# Patient Record
Sex: Female | Born: 1945 | Race: White | Hispanic: No | Smoking: Never smoker
Health system: Southern US, Community
[De-identification: ages and names within clinical notes are randomized; demographics above are authoritative.]

## PROBLEM LIST (undated history)

## (undated) DIAGNOSIS — E669 Obesity, unspecified: Secondary | ICD-10-CM

## (undated) DIAGNOSIS — F32A Depression, unspecified: Secondary | ICD-10-CM

## (undated) DIAGNOSIS — R5383 Other fatigue: Secondary | ICD-10-CM

## (undated) DIAGNOSIS — M1990 Unspecified osteoarthritis, unspecified site: Secondary | ICD-10-CM

## (undated) DIAGNOSIS — Z8719 Personal history of other diseases of the digestive system: Secondary | ICD-10-CM

## (undated) DIAGNOSIS — M2550 Pain in unspecified joint: Secondary | ICD-10-CM

## (undated) DIAGNOSIS — G2581 Restless legs syndrome: Secondary | ICD-10-CM

## (undated) DIAGNOSIS — F419 Anxiety disorder, unspecified: Secondary | ICD-10-CM

## (undated) DIAGNOSIS — Z9889 Other specified postprocedural states: Secondary | ICD-10-CM

## (undated) DIAGNOSIS — I10 Essential (primary) hypertension: Secondary | ICD-10-CM

## (undated) DIAGNOSIS — D649 Anemia, unspecified: Secondary | ICD-10-CM

## (undated) DIAGNOSIS — E538 Deficiency of other specified B group vitamins: Secondary | ICD-10-CM

## (undated) DIAGNOSIS — M549 Dorsalgia, unspecified: Secondary | ICD-10-CM

## (undated) DIAGNOSIS — K219 Gastro-esophageal reflux disease without esophagitis: Secondary | ICD-10-CM

## (undated) DIAGNOSIS — R112 Nausea with vomiting, unspecified: Secondary | ICD-10-CM

## (undated) DIAGNOSIS — I890 Lymphedema, not elsewhere classified: Secondary | ICD-10-CM

## (undated) DIAGNOSIS — K829 Disease of gallbladder, unspecified: Secondary | ICD-10-CM

## (undated) DIAGNOSIS — F329 Major depressive disorder, single episode, unspecified: Secondary | ICD-10-CM

## (undated) DIAGNOSIS — R42 Dizziness and giddiness: Secondary | ICD-10-CM

## (undated) HISTORY — PX: EYE SURGERY: SHX253

## (undated) HISTORY — DX: Depression, unspecified: F32.A

## (undated) HISTORY — DX: Deficiency of other specified B group vitamins: E53.8

## (undated) HISTORY — PX: OVARIAN CYST REMOVAL: SHX89

## (undated) HISTORY — DX: Disease of gallbladder, unspecified: K82.9

## (undated) HISTORY — DX: Dorsalgia, unspecified: M54.9

## (undated) HISTORY — PX: ABDOMINAL HYSTERECTOMY: SHX81

## (undated) HISTORY — DX: Unspecified osteoarthritis, unspecified site: M19.90

## (undated) HISTORY — DX: Pain in unspecified joint: M25.50

## (undated) HISTORY — DX: Other fatigue: R53.83

## (undated) HISTORY — PX: CHOLECYSTECTOMY: SHX55

## (undated) HISTORY — PX: LAPAROSCOPIC GASTRIC BANDING: SHX1100

## (undated) HISTORY — PX: COLONOSCOPY: SHX174

## (undated) HISTORY — DX: Essential (primary) hypertension: I10

## (undated) HISTORY — PX: FRACTURE SURGERY: SHX138

## (undated) HISTORY — DX: Major depressive disorder, single episode, unspecified: F32.9

## (undated) HISTORY — DX: Obesity, unspecified: E66.9

## (undated) HISTORY — DX: Anxiety disorder, unspecified: F41.9

---

## 1998-08-04 ENCOUNTER — Encounter: Payer: Self-pay | Admitting: Family Medicine

## 1998-08-04 ENCOUNTER — Ambulatory Visit (HOSPITAL_COMMUNITY): Admission: RE | Admit: 1998-08-04 | Discharge: 1998-08-04 | Payer: Self-pay | Admitting: Family Medicine

## 2000-02-03 ENCOUNTER — Encounter (INDEPENDENT_AMBULATORY_CARE_PROVIDER_SITE_OTHER): Payer: Self-pay | Admitting: Specialist

## 2000-02-03 ENCOUNTER — Other Ambulatory Visit: Admission: RE | Admit: 2000-02-03 | Discharge: 2000-02-03 | Payer: Self-pay | Admitting: Otolaryngology

## 2002-05-06 ENCOUNTER — Ambulatory Visit (HOSPITAL_BASED_OUTPATIENT_CLINIC_OR_DEPARTMENT_OTHER): Admission: RE | Admit: 2002-05-06 | Discharge: 2002-05-06 | Payer: Self-pay | Admitting: Family Medicine

## 2002-12-02 ENCOUNTER — Emergency Department (HOSPITAL_COMMUNITY): Admission: EM | Admit: 2002-12-02 | Discharge: 2002-12-02 | Payer: Self-pay | Admitting: *Deleted

## 2002-12-27 ENCOUNTER — Ambulatory Visit (HOSPITAL_COMMUNITY): Admission: RE | Admit: 2002-12-27 | Discharge: 2002-12-27 | Payer: Self-pay | Admitting: Family Medicine

## 2003-02-21 ENCOUNTER — Encounter (INDEPENDENT_AMBULATORY_CARE_PROVIDER_SITE_OTHER): Payer: Self-pay | Admitting: Specialist

## 2003-02-21 ENCOUNTER — Ambulatory Visit (HOSPITAL_COMMUNITY): Admission: RE | Admit: 2003-02-21 | Discharge: 2003-02-21 | Payer: Self-pay | Admitting: Gastroenterology

## 2003-05-09 ENCOUNTER — Emergency Department (HOSPITAL_COMMUNITY): Admission: EM | Admit: 2003-05-09 | Discharge: 2003-05-09 | Payer: Self-pay

## 2003-05-21 ENCOUNTER — Encounter: Admission: RE | Admit: 2003-05-21 | Discharge: 2003-08-19 | Payer: Self-pay | Admitting: Family Medicine

## 2003-06-19 ENCOUNTER — Emergency Department (HOSPITAL_COMMUNITY): Admission: EM | Admit: 2003-06-19 | Discharge: 2003-06-19 | Payer: Self-pay | Admitting: Emergency Medicine

## 2004-03-19 ENCOUNTER — Encounter: Admission: RE | Admit: 2004-03-19 | Discharge: 2004-03-19 | Payer: Self-pay | Admitting: Occupational Medicine

## 2005-11-01 ENCOUNTER — Ambulatory Visit (HOSPITAL_COMMUNITY): Admission: RE | Admit: 2005-11-01 | Discharge: 2005-11-01 | Payer: Self-pay | Admitting: Family Medicine

## 2007-02-23 ENCOUNTER — Encounter (INDEPENDENT_AMBULATORY_CARE_PROVIDER_SITE_OTHER): Payer: Self-pay | Admitting: Obstetrics & Gynecology

## 2007-02-23 ENCOUNTER — Ambulatory Visit (HOSPITAL_COMMUNITY): Admission: RE | Admit: 2007-02-23 | Discharge: 2007-02-23 | Payer: Self-pay | Admitting: Obstetrics & Gynecology

## 2007-04-24 ENCOUNTER — Ambulatory Visit (HOSPITAL_COMMUNITY): Admission: RE | Admit: 2007-04-24 | Discharge: 2007-04-25 | Payer: Self-pay | Admitting: Obstetrics & Gynecology

## 2007-04-24 ENCOUNTER — Encounter (INDEPENDENT_AMBULATORY_CARE_PROVIDER_SITE_OTHER): Payer: Self-pay | Admitting: Obstetrics & Gynecology

## 2007-07-27 ENCOUNTER — Emergency Department (HOSPITAL_COMMUNITY): Admission: EM | Admit: 2007-07-27 | Discharge: 2007-07-27 | Payer: Self-pay | Admitting: Emergency Medicine

## 2007-08-08 ENCOUNTER — Emergency Department (HOSPITAL_BASED_OUTPATIENT_CLINIC_OR_DEPARTMENT_OTHER): Admission: EM | Admit: 2007-08-08 | Discharge: 2007-08-08 | Payer: Self-pay | Admitting: Emergency Medicine

## 2008-10-31 ENCOUNTER — Ambulatory Visit (HOSPITAL_COMMUNITY): Admission: RE | Admit: 2008-10-31 | Discharge: 2008-10-31 | Payer: Self-pay | Admitting: General Surgery

## 2008-11-11 ENCOUNTER — Encounter: Admission: RE | Admit: 2008-11-11 | Discharge: 2009-01-21 | Payer: Self-pay | Admitting: General Surgery

## 2009-03-09 ENCOUNTER — Encounter: Admission: RE | Admit: 2009-03-09 | Discharge: 2009-04-07 | Payer: Self-pay | Admitting: General Surgery

## 2009-03-23 ENCOUNTER — Ambulatory Visit (HOSPITAL_COMMUNITY): Admission: RE | Admit: 2009-03-23 | Discharge: 2009-03-24 | Payer: Self-pay | Admitting: General Surgery

## 2009-07-14 ENCOUNTER — Encounter: Admission: RE | Admit: 2009-07-14 | Discharge: 2009-07-14 | Payer: Self-pay | Admitting: General Surgery

## 2009-10-15 ENCOUNTER — Encounter: Admission: RE | Admit: 2009-10-15 | Discharge: 2009-10-23 | Payer: Self-pay | Admitting: General Surgery

## 2010-04-14 LAB — DIFFERENTIAL
Eosinophils Absolute: 0.1 10*3/uL (ref 0.0–0.7)
Eosinophils Relative: 2 % (ref 0–5)
Lymphocytes Relative: 17 % (ref 12–46)
Monocytes Absolute: 0.4 10*3/uL (ref 0.1–1.0)
Neutro Abs: 3.9 10*3/uL (ref 1.7–7.7)
Neutrophils Relative %: 73 % (ref 43–77)

## 2010-04-14 LAB — CBC
MCV: 83.7 fL (ref 78.0–100.0)
Platelets: 174 10*3/uL (ref 150–400)
RDW: 15.9 % — ABNORMAL HIGH (ref 11.5–15.5)

## 2010-04-14 LAB — COMPREHENSIVE METABOLIC PANEL
CO2: 30 mEq/L (ref 19–32)
Calcium: 9.1 mg/dL (ref 8.4–10.5)
GFR calc non Af Amer: >60 mL/min (ref >60)
Potassium: 4 mEq/L (ref 3.5–5.1)

## 2010-04-18 LAB — DIFFERENTIAL
Eosinophils Absolute: 0.1 10*3/uL (ref 0.0–0.7)
Eosinophils Relative: 1 % (ref 0–5)
Monocytes Absolute: 0.5 10*3/uL (ref 0.1–1.0)
Monocytes Relative: 7 % (ref 3–12)
Neutrophils Relative %: 75 % (ref 43–77)

## 2010-04-18 LAB — CBC
Hemoglobin: 12.8 g/dL (ref 12.0–15.0)
RDW: 17.2 % — ABNORMAL HIGH (ref 11.5–15.5)

## 2010-06-08 NOTE — Op Note (Signed)
Paige [name], Paige [name]              ACCOUNT NO.:  [id number]   MEDICAL RECORD NO.:  [id number]          PATIENT TYPE:  AMB   LOCATION:  SDC                           FACILITY:  [facility]   PHYSICIAN:  Genia Del, M.D.DATE OF BIRTH:  03/28/1945   DATE OF PROCEDURE:  02/23/2007  DATE OF DISCHARGE:                               OPERATIVE REPORT   PREOPERATIVE DIAGNOSIS:  Postmenopausal bleeding and focal complex  hyperplasia without atypica.   POSTOPERATIVE DIAGNOSIS:  Postmenopausal bleeding and focal complex  hyperplasia without atypica, with endometrial polyp, thick endometrium,  with atypical blood vessels.   PROCEDURE:  Hysteroscopy plus resection of endometrial polyps and  dilatation and curettage.   SURGEON:  Dr. Genia Del.   ANESTHESIOLOGIST:  Dr. Cristela Blue.   PROCEDURE:  Under MAC analgesia, the patient is in lithotomy position.  She is prepped with Betadine on the suprapubic, vulvar and vaginal areas  and draped as usual.  The bladder is catheterized.  The vaginal exam  reveals an anteverted uterus, no adnexal mass.  The cervix is normal to  palpation.  We then insert the speculum in the vagina.  The anterior lip  of the cervix is grasped with a tenaculum.  A paracervical block is done  with lidocaine 1%, 20 mL total, at 4 and 8 o'clock.  We then proceed  with dilatation of the cervix with Hegar dilators up to #33 without  difficulty.  The hysterometry is at 9 cm.  We insert the diagnostic  hysteroscope in the intrauterine cavity.  Multiple polyps are seen.  Some atypical blood vessels are seen on the thick endometrium.  We  removed the diagnostic hysteroscope.  We proceed with a systematic  curettage of the intrauterine cavity with a sharp curette.  Large  amounts of endometrium is sent to pathology.  We then dilate the cervix  further up to Hegar #37 without difficulty.  We introduce the operative  hysteroscope with a loop inside the intrauterine cavity.  We  resect  multiple polyps, some of them with an atypical look with increased  vascularity and possible complications.  All the resected material is  sent separately to pathology.  We then verify hemostasis.  It is  adequate at all levels.  We complete with curettage of the intrauterine  cavity to remove the specimen as much as possible.  We then removed all  instruments.  Hemostasis is adequate.  The fluid deficit is 300 mL.  Estimated blood loss is minimal.  No complications occurred, and the  patient is brought to recovery room in good stable status.      Genia Del, M.D.  Electronically Signed     ML/MEDQ  D:  02/23/2007  T:  02/24/2007  Job:  [id number]

## 2010-06-08 NOTE — Op Note (Signed)
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   PROCEDURE:  Total laparoscopic hysterectomy plus left salpingo-  oophorectomy and lysis of adhesions, peritoneal washings.   SURGEON:  Genia Del, M.D.   ASSISTANT:  Lendon Colonel, M.D.   DESCRIPTION OF PROCEDURE:  Under general anesthesia with endotracheal  intubation, the patient is in the lithotomy position.  She is prepped  with Betadine on the abdominal, suprapubic, vulvar, and vaginal areas  and draped as usual.  The bladder catheter is put in place. The Foley is  left. We do a vaginal exam. The uterus is anteverted, normal volume,  mobile, no adnexal mass.  The weighted speculum is inserted in the  vagina.  The anterior lip of the cervix was grasped with a tenaculum.  The hysterometry is at 10 cm.  We dilate the cervix up to Hegar dilator  #27.  We then put a Rumi with a Kohring, the Rumi is a #10.  We then  remove the tenaculum and the weighted speculum.  We go to abdominal  time.  We make a supraumbilical incision with a scalpel over 2 cm after  infiltrating of the skin with Marcaine 0.25% plain.  We dissect layer by  layer with Mayo scissors, open the aponeurosis and then the parietal  peritoneum.  We put a pursestring stitch of Vicryl 0 in the aponeurosis.  The Roseanne Reno is introduced.  Pneumoperitoneum is created  with CO2.  We  then insert the camera and visualize the abdominopelvic cavity.  No  adhesion with the anterior wall is present.  We, therefore, used a W  configuration for our ports.  We infiltrate the skin with Marcaine  0.25%.  We then used the scalpel to make the three robotic incisions as  well as the assistant port incision.  We insert all ports under direct  vision.  We then docked the robot as usual without difficulty.  We  insert the instruments, an Endoshear scissor in the right arm, the  fenestrated bipolar in the left arm, and the Cobra clamp in the third  robotic arm.  We then go to the console.  We start on the left side. The  ovary and tube are normal in size and appearance.  We attempt  visualizing the ureter but it is not visible.  We stay, therefore, very  high and safely coagulate and section the left infundibulopelvic  ligament.  We follow the ovary and reach the site of the uterus  coagulating and sectioning successively.  We cauterize and sectioned the  left round ligament.  We then followed the left border of the uterus  closely cauterizing and sectioning progressively. We then opened the  anterior visceral peritoneum with the Endoshear scissor and retract the  bladder downward.  We then cauterize and section the left uterine  artery. On the right side, the tube and ovary have been removed  previously. 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  We cauterize and  section the right round ligament.  We open the peritoneum anteriorly and  reach where we had opened it on the left side.  We reclined the bladder  downward further. We then opened the peritoneum posteriorly and follow  very close to the fibroids, cauterizing and sectioning, until we reach  the right uterine artery.  It is cauterized and sectioned. The Kohring  is pushed as much as possible in. We then opened the superior parts of  the vagina  anteriorly with the tip of the Endoshear scissors.  We follow  the Kohring to the lateral side.  We then reposition to open the  posterior superior vaginal vault the same way.  Note that we had to  dissect the sigmoid down to allow opening of the vagina.  We then finish  this circular opening at the superior aspect of the vagina and that  frees the uterus completely.  We had to undock the robot and reposition  the patient to pull the uterus with the left adnexa vaginally.  The  uterus is removed in one piece with the left adnexa and sent to  pathology.  We then put the occluder back in the vagina, reposition the  patient, and redock the robot.  We switched the instruments to the  needle drivers in the right arm, the cutting needle driver is used, and  the fenestrated bipolar is used in the third robotic arm.  We used the  tie on a Vicryl 0 and close the vaginal vault with two half running  sutures.  We add a figure-of-eight on the right aspect of the vagina to  close it more tightly. Hemostasis is good at all levels.  We irrigate  and suction the abdominal and pelvic cavities.  We verified the pedicles  on the left pelvic wall.  Hemostasis is adequate at that level, as well.  We then remove all instruments.  We undocked the robot.  We go by  laparoscopy to suction any fluid left in after reversing the  Trendelenburg. We remove the trocars under direct vision.  We evacuate  the CO2.  We close the supraumbilical incision at the aponeurosis by  attaching the pursestring stitch.  We then put a subcuticular stitch on  all incisions with a Vicryl 4-0 and we add Dermabond on all incisions.  We removed the occluder vaginally. The count of instruments and sponges  was complete x2.  The estimated blood loss was 75 mL.  The patient  received a dose of Ancef 1 gram IV at the beginning of the intervention.  The patient was brought to the recovery room in good stable status.      Genia Del,  M.D.  Electronically Signed     ML/MEDQ  D:  04/24/2007  T:  04/24/2007  Job:  [id number]

## 2010-06-11 NOTE — Op Note (Signed)
Paige [name], Paige [name]                        ACCOUNT NO.:  [id number]   MEDICAL RECORD NO.:  [id number]                   PATIENT TYPE:  AMB   LOCATION:  ENDO                                 FACILITY:  [facility]   PHYSICIAN:  James L. Malon Kindle., M.D.          DATE OF BIRTH:  23-Aug-1945   DATE OF PROCEDURE:  02/21/2003  DATE OF DISCHARGE:                                 OPERATIVE REPORT   PROCEDURE:  Esophagogastroduodenoscopy with biopsy.   MEDICATIONS:  Fentanyl 75 mcg, Versed 8 mg IV, Cetacaine spray.   INDICATIONS:  Iron-deficiency anemia of unknown cause.   DESCRIPTION OF PROCEDURE:  After the procedure had been explained to the  patient, consent obtained.  The patient was in the left lateral decubitus  position.  The Olympus upper endoscope was inserted and advanced.  We  advanced down into the stomach and into the small intestines.  The small  intestine was completely normal the full extent of the scope.  We had 60 cm  of scope in what was felt to be the third duodenum.  Several biopsies were  obtained to rule out celiac disease.  The scope was withdrawn and mucosa was  completely normal.  The duodenal bulb was free of ulcerations or  inflammation.  The stomach including the antrum and body were normal.  No  ulcerations or inflammation.  The fundus and cardia were seen on the  retroflexed view and were normal.  There was a small hiatal hernia.  The  distal and proximal esophagus were endoscopically normal.  The scope was  withdrawn.  The patient tolerated the procedure well.   ASSESSMENT:  Iron-deficiency anemia.  No obvious cause found on upper  endoscopy.  280.0.   PLAN:  Will proceed with colonoscopy at this time and check the pathology  for celiac disease.                                               James L. Malon Kindle., M.D.    Waldron Session  D:  02/21/2003  T:  02/21/2003  Job:  [id number]   cc:   Sigmund Hazel, M.D.  [address]  [address] [place], [place]  [address]  Fax: [phone]

## 2010-06-11 NOTE — Op Note (Signed)
Paige [name], Paige [name]                        ACCOUNT NO.:  [id number]   MEDICAL RECORD NO.:  [id number]                   PATIENT TYPE:  AMB   LOCATION:  ENDO                                 FACILITY:  [facility]   PHYSICIAN:  James L. Malon Kindle., M.D.          DATE OF BIRTH:  Nov 20, 1945   DATE OF PROCEDURE:  02/21/2003  DATE OF DISCHARGE:                                 OPERATIVE REPORT   PROCEDURE:  Colonoscopy.   MEDICATIONS:  The patient received a total of fentanyl 125 mcg and Versed 12  mg for both procedures.   INDICATIONS:  Iron-deficiency anemia.  Endoscopy done just before this  procedure was normal.   DESCRIPTION OF PROCEDURE:  The procedure had been explained to the patient  and consent obtained.  With the patient in the left lateral decubitus  position, the Olympus pediatric adjustable endoscope was inserted and  advanced under direct visualization.  The prep was quite good.  The patient  had a very long, tortuous colon, and multiple maneuvers were required.  This  was a large woman, and we tried on the left lateral, right lateral decubitus  position as well as in the supine position.  We were able to advance down to  just above the ileocecal valve, which could be seen in the distance.  There  was no gross lesion seen in the cecum.  Although the tip could not be seen  well, it was somewhat dirty.  The scope was withdrawn and the ascending  colon, hepatic flexure, transverse colon, descending and sigmoid colon were  seen well upon removal and no polyps or other lesions were seen.  There was  no significant diverticular disease.  The scope was withdrawn, and the  rectum was free of polyps.  The patient tolerated the procedure well, was  maintained on low-flow oxygen and pulse oximetry throughout the procedure.   ASSESSMENT:  Iron-deficiency anemia, 280.0, with colonoscopy negative to  just above the ileocecal valve.   PLAN:  Will continue the patient on iron.  Will  recommend SlowFe one to two  daily, and will see back in the office in six weeks and recheck her stool  and blood count.                                               James L. Malon Kindle., M.D.    Waldron Session  D:  02/21/2003  T:  02/21/2003  Job:  [id number]   cc:   Deboraha Sprang at [facility], M.D.

## 2010-07-23 ENCOUNTER — Ambulatory Visit (INDEPENDENT_AMBULATORY_CARE_PROVIDER_SITE_OTHER): Payer: BC Managed Care – PPO | Admitting: Physician Assistant

## 2010-07-23 ENCOUNTER — Encounter (INDEPENDENT_AMBULATORY_CARE_PROVIDER_SITE_OTHER): Payer: Self-pay

## 2010-07-23 VITALS — BP 142/88 | Ht 68.0 in | Wt 257.8 lb

## 2010-07-23 DIAGNOSIS — Z4651 Encounter for fitting and adjustment of gastric lap band: Secondary | ICD-10-CM

## 2010-07-23 NOTE — Progress Notes (Signed)
Subjective:     Patient ID: Paige [name], female   DOB: [birth year], 65 y.o.   MRN: [id number]    BP 142/88  Ht 5\' 8"  (1.727 m)  Wt 257 lb 12.8 oz (116.937 kg)  BMI 39.20 kg/m2    HPI Please see scanned, dictated note  Review of Systems     Objective:   Physical Exam     Assessment:         Plan:

## 2010-07-23 NOTE — Patient Instructions (Signed)
Patient is to return in 3 months or call sooner if having problems.

## 2010-10-08 ENCOUNTER — Encounter (INDEPENDENT_AMBULATORY_CARE_PROVIDER_SITE_OTHER): Payer: BC Managed Care – PPO

## 2010-10-14 LAB — TYPE AND SCREEN

## 2010-10-14 LAB — CBC
HCT: 41.8
MCV: 83.3
Platelets: 231
RDW: 16.2 — ABNORMAL HIGH

## 2010-10-14 LAB — ABO/RH: ABO/RH(D): O POS

## 2010-10-15 ENCOUNTER — Encounter (INDEPENDENT_AMBULATORY_CARE_PROVIDER_SITE_OTHER): Payer: BC Managed Care – PPO

## 2010-10-18 LAB — COMPREHENSIVE METABOLIC PANEL
ALT: 25
Albumin: 3.4 — ABNORMAL LOW
Alkaline Phosphatase: 123 — ABNORMAL HIGH
BUN: 13
Calcium: 8.7
Creatinine, Ser: 0.81
Total Bilirubin: 0.6
Total Protein: 6.3

## 2010-10-18 LAB — CBC
HCT: 40.3
Hemoglobin: 13.2
RDW: 15.5

## 2010-10-18 LAB — TYPE AND SCREEN: ABO/RH(D): O POS

## 2010-10-19 LAB — CBC
MCHC: 35
Platelets: 165
RDW: 14.6

## 2010-10-21 LAB — POCT I-STAT, CHEM 8
BUN: 12
Chloride: 103
Creatinine, Ser: 1.1
Glucose, Bld: 119 — ABNORMAL HIGH
Potassium: 4.1

## 2010-10-21 LAB — CBC
Hemoglobin: 14.1
RBC: 4.94

## 2010-10-21 LAB — DIFFERENTIAL
Basophils Absolute: 0
Eosinophils Relative: 1
Monocytes Absolute: 0.4
Monocytes Relative: 5
Neutro Abs: 6.6

## 2010-10-22 ENCOUNTER — Encounter (INDEPENDENT_AMBULATORY_CARE_PROVIDER_SITE_OTHER): Payer: BC Managed Care – PPO

## 2010-10-22 LAB — URINALYSIS, ROUTINE W REFLEX MICROSCOPIC
Bilirubin Urine: NEGATIVE
Hgb urine dipstick: NEGATIVE
Ketones, ur: NEGATIVE
Nitrite: NEGATIVE
pH: 5

## 2010-10-22 LAB — CBC
HCT: 38.9
Hemoglobin: 13.7
MCHC: 35.2
RDW: 14.9

## 2010-10-22 LAB — BASIC METABOLIC PANEL
BUN: 16
CO2: 27
GFR calc non Af Amer: >60
Glucose, Bld: 104 — ABNORMAL HIGH
Potassium: 3.8

## 2010-10-22 LAB — DIFFERENTIAL
Basophils Absolute: 0.1
Basophils Relative: 1
Eosinophils Relative: 1
Lymphocytes Relative: 11 — ABNORMAL LOW
Monocytes Absolute: 0.6
Monocytes Relative: 7

## 2010-11-19 ENCOUNTER — Ambulatory Visit (INDEPENDENT_AMBULATORY_CARE_PROVIDER_SITE_OTHER): Payer: BC Managed Care – PPO | Admitting: Physician Assistant

## 2010-11-19 ENCOUNTER — Encounter (INDEPENDENT_AMBULATORY_CARE_PROVIDER_SITE_OTHER): Payer: Self-pay

## 2010-11-19 VITALS — BP 150/72 | HR 80 | Temp 96.9°F | Resp 16 | Ht 68.0 in | Wt 272.8 lb

## 2010-11-19 DIAGNOSIS — Z4651 Encounter for fitting and adjustment of gastric lap band: Secondary | ICD-10-CM

## 2010-11-19 NOTE — Progress Notes (Signed)
  HISTORY: Paige [name] is a 65 y.o.female who received an AP-Large lap-band in February 2011 by Dr. Johna Sheriff. She said the past few months have been difficult due to having to take care of her ill husband. She's 'fallen off the wagon' with her diet and wants to get back on track. She denies persistent vomiting or regurgitation. She says she's having hunger and increased portion sizes.  VITAL SIGNS: Filed Vitals:   11/19/10 1422  BP: 150/72  Pulse: 80  Temp: 96.9 F (36.1 C)  Resp: 16    PHYSICAL EXAM: Physical exam reveals a very well-appearing 65 y.o.female in no apparent distress Neurologic: Awake, alert, oriented Psych: Bright affect, conversant Respiratory: Breathing even and unlabored. No stridor or wheezing Abdomen: Soft, nontender, nondistended to palpation. Incisions well-healed. No incisional hernias. Port easily palpated. Extremities: Atraumatic, good range of motion.  ASSESMENT: 65 y.o.  female  s/p AP-Large lap-band.   PLAN: The patient's port was accessed with a 20G Huber needle without difficulty. Clear fluid was aspirated and 0.5 mL saline was added to the port. The patient was able to swallow water without difficulty following the procedure and was instructed to take clear liquids for the next 24-48 hours and advance slowly as tolerated.

## 2010-11-19 NOTE — Patient Instructions (Signed)
Take clear liquids for the next 48 hours. Thin protein shakes are ok to start on Saturday evening. Call us if you have persistent vomiting or regurgitation, night cough or reflux symptoms. Return as scheduled or sooner if you notice no changes in hunger/portion sizes.   

## 2011-01-10 ENCOUNTER — Encounter (INDEPENDENT_AMBULATORY_CARE_PROVIDER_SITE_OTHER): Payer: Self-pay | Admitting: Surgery

## 2012-02-02 ENCOUNTER — Ambulatory Visit (INDEPENDENT_AMBULATORY_CARE_PROVIDER_SITE_OTHER): Payer: MEDICARE | Admitting: Physician Assistant

## 2012-02-02 ENCOUNTER — Encounter (INDEPENDENT_AMBULATORY_CARE_PROVIDER_SITE_OTHER): Payer: Self-pay | Admitting: Physician Assistant

## 2012-02-02 VITALS — BP 142/78 | HR 102 | Temp 98.4°F | Resp 20 | Ht 68.0 in | Wt 307.6 lb

## 2012-02-02 DIAGNOSIS — Z4651 Encounter for fitting and adjustment of gastric lap band: Secondary | ICD-10-CM

## 2012-02-02 NOTE — Progress Notes (Signed)
  HISTORY: Paige [name] is a 67 y.o.female who received an AP-Large lap-band in February 2011 by Dr. Johna Sheriff. She was last seen in October 2012 and unfortunately is three pounds over her pre-op weight. She comes in with a desire to get back on track. She complains of increased hunger and portion sizes. She has not had much exercise since weight gain. She denies regurgitation or persistent reflux.  VITAL SIGNS: Filed Vitals:   02/02/12 1014  BP: 142/78  Pulse: 102  Temp: 98.4 F (36.9 C)  Resp: 20    PHYSICAL EXAM: Physical exam reveals a very well-appearing 67 y.o.female in no apparent distress Neurologic: Awake, alert, oriented Psych: Bright affect, conversant Respiratory: Breathing even and unlabored. No stridor or wheezing Abdomen: Soft, nontender, nondistended to palpation. Incisions well-healed. No incisional hernias. Port easily palpated. Extremities: Atraumatic, good range of motion.  ASSESMENT: 67 y.o.  female  s/p AP-Large lap-band.   PLAN: The patient's port was accessed with a 20G Huber needle without difficulty. Clear fluid was aspirated and 1 mL saline was added to the port. The patient was able to swallow water without difficulty following the procedure and was instructed to take clear liquids for the next 24-48 hours and advance slowly as tolerated. We are setting her up with Huntley Dec in nutrition to get her back on track with eating habits and food choices. I asked her to return in one month or sooner if she were to have restrictive symptoms. She voiced understanding and agreement.

## 2012-02-02 NOTE — Patient Instructions (Signed)
Take clear liquids tonight. Thin protein shakes are ok to start tomorrow morning. Slowly advance your diet thereafter. Call us if you have persistent vomiting or regurgitation, night cough or reflux symptoms. Return as scheduled or sooner if you notice no changes in hunger/portion sizes.  

## 2012-02-15 ENCOUNTER — Ambulatory Visit: Payer: Self-pay | Admitting: *Deleted

## 2012-03-15 ENCOUNTER — Encounter (INDEPENDENT_AMBULATORY_CARE_PROVIDER_SITE_OTHER): Payer: Medicare Other

## 2013-05-13 ENCOUNTER — Other Ambulatory Visit (INDEPENDENT_AMBULATORY_CARE_PROVIDER_SITE_OTHER): Payer: Self-pay

## 2013-05-13 DIAGNOSIS — Z9884 Bariatric surgery status: Secondary | ICD-10-CM

## 2013-05-14 ENCOUNTER — Telehealth (INDEPENDENT_AMBULATORY_CARE_PROVIDER_SITE_OTHER): Payer: Self-pay | Admitting: *Deleted

## 2013-05-14 NOTE — Telephone Encounter (Signed)
LM for pt to call back.  Please inform her of her appt for her UGI @ [facility]. [phone] arriving at 9:00 a.m. NPO after midnight the night before.  Thanks!  Victorino Dike

## 2013-05-16 ENCOUNTER — Ambulatory Visit
Admission: RE | Admit: 2013-05-16 | Discharge: 2013-05-16 | Disposition: A | Discharge status: Home / Self Care | Payer: Medicare Other | Source: Ambulatory Visit | Attending: General Surgery | Admitting: General Surgery

## 2013-05-16 DIAGNOSIS — Z9884 Bariatric surgery status: Secondary | ICD-10-CM

## 2013-06-07 ENCOUNTER — Encounter (INDEPENDENT_AMBULATORY_CARE_PROVIDER_SITE_OTHER): Payer: Self-pay

## 2013-07-08 ENCOUNTER — Ambulatory Visit: Payer: Medicare Other | Attending: Family Medicine | Admitting: Physical Therapy

## 2013-07-08 DIAGNOSIS — M545 Low back pain, unspecified: Secondary | ICD-10-CM | POA: Insufficient documentation

## 2013-07-08 DIAGNOSIS — IMO0001 Reserved for inherently not codable concepts without codable children: Secondary | ICD-10-CM | POA: Insufficient documentation

## 2013-07-08 DIAGNOSIS — M25569 Pain in unspecified knee: Secondary | ICD-10-CM | POA: Insufficient documentation

## 2013-07-11 ENCOUNTER — Ambulatory Visit: Payer: Medicare Other | Admitting: Physical Therapy

## 2013-07-16 ENCOUNTER — Ambulatory Visit: Payer: Medicare Other | Admitting: Physical Therapy

## 2013-07-18 ENCOUNTER — Ambulatory Visit: Payer: Medicare Other | Admitting: Physical Therapy

## 2013-07-24 ENCOUNTER — Ambulatory Visit: Payer: Medicare Other | Attending: Family Medicine | Admitting: Physical Therapy

## 2013-07-24 DIAGNOSIS — M545 Low back pain, unspecified: Secondary | ICD-10-CM | POA: Diagnosis not present

## 2013-07-24 DIAGNOSIS — M25569 Pain in unspecified knee: Secondary | ICD-10-CM | POA: Diagnosis not present

## 2013-07-24 DIAGNOSIS — IMO0001 Reserved for inherently not codable concepts without codable children: Secondary | ICD-10-CM | POA: Insufficient documentation

## 2013-07-30 ENCOUNTER — Ambulatory Visit: Payer: Medicare Other | Admitting: Physical Therapy

## 2013-07-30 DIAGNOSIS — IMO0001 Reserved for inherently not codable concepts without codable children: Secondary | ICD-10-CM | POA: Diagnosis not present

## 2013-08-02 ENCOUNTER — Ambulatory Visit: Payer: Medicare Other | Admitting: Physical Therapy

## 2013-08-02 DIAGNOSIS — IMO0001 Reserved for inherently not codable concepts without codable children: Secondary | ICD-10-CM | POA: Diagnosis not present

## 2013-08-05 ENCOUNTER — Ambulatory Visit: Payer: Medicare Other | Admitting: Physical Therapy

## 2013-08-05 DIAGNOSIS — IMO0001 Reserved for inherently not codable concepts without codable children: Secondary | ICD-10-CM | POA: Diagnosis not present

## 2013-08-13 ENCOUNTER — Ambulatory Visit: Payer: Medicare Other | Admitting: Physical Therapy

## 2013-08-13 DIAGNOSIS — IMO0001 Reserved for inherently not codable concepts without codable children: Secondary | ICD-10-CM | POA: Diagnosis not present

## 2013-08-15 ENCOUNTER — Ambulatory Visit: Payer: Medicare Other | Admitting: Physical Therapy

## 2013-08-20 ENCOUNTER — Ambulatory Visit: Payer: Medicare Other | Admitting: Physical Therapy

## 2013-08-20 DIAGNOSIS — IMO0001 Reserved for inherently not codable concepts without codable children: Secondary | ICD-10-CM | POA: Diagnosis not present

## 2013-08-27 ENCOUNTER — Ambulatory Visit: Payer: Medicare Other | Admitting: Physical Therapy

## 2013-08-29 ENCOUNTER — Ambulatory Visit: Payer: Medicare Other | Admitting: Physical Therapy

## 2013-09-02 ENCOUNTER — Other Ambulatory Visit: Payer: Self-pay | Admitting: Orthopedic Surgery

## 2013-09-12 ENCOUNTER — Encounter (HOSPITAL_COMMUNITY): Payer: Self-pay | Admitting: Pharmacy Technician

## 2013-09-17 ENCOUNTER — Inpatient Hospital Stay (HOSPITAL_COMMUNITY)
Admission: RE | Admit: 2013-09-17 | Discharge: 2013-09-17 | Disposition: A | Discharge status: Home / Self Care | Payer: Medicare Other | Source: Ambulatory Visit

## 2013-09-17 NOTE — Pre-Procedure Instructions (Signed)
Paige [name]  09/17/2013   Your procedure is scheduled on:  Wednesday  09/25/13  Report to [facility] Admitting at 1045 AM.  Call this number if you have problems the morning of surgery: [phone]   Remember:   Do not eat food or drink liquids after midnight.   Take these medicines the morning of surgery with A SIP OF WATER:  HYDROCODONE IF NEEDED, PANTOPRAZOLE, PAROXETINE(PAXIL) (STOP NAPROXEN NOW)   Do not wear jewelry, make-up or nail polish.  Do not wear lotions, powders, or perfumes. You may wear deodorant.  Do not shave 48 hours prior to surgery. Men may shave face and neck.  Do not bring valuables to the hospital.  [facility] is not responsible                  for any belongings or valuables.               Contacts, dentures or bridgework may not be worn into surgery.  Leave suitcase in the car. After surgery it may be brought to your room.  For patients admitted to the hospital, discharge time is determined by your                treatment team.               Patients discharged the day of surgery will not be allowed to drive  home.  Name and phone number of your driver:   Special Instructions:  Special Instructions: [facility] - Preparing for Surgery  Before surgery, you can play an important role.  Because skin is not sterile, your skin needs to be as free of germs as possible.  You can reduce the number of germs on you skin by washing with CHG (chlorahexidine gluconate) soap before surgery.  CHG is an antiseptic cleaner which kills germs and bonds with the skin to continue killing germs even after washing.  Please DO NOT use if you have an allergy to CHG or antibacterial soaps.  If your skin becomes reddened/irritated stop using the CHG and inform your nurse when you arrive at Short Stay.  Do not shave (including legs and underarms) for at least 48 hours prior to the first CHG shower.  You may shave your face.  Please follow these instructions  carefully:   1.  Shower with CHG Soap the night before surgery and the morning of Surgery.  2.  If you choose to wash your hair, wash your hair first as usual with your normal shampoo.  3.  After you shampoo, rinse your hair and body thoroughly to remove the Shampoo.  4.  Use CHG as you would any other liquid soap. You can apply chg directly to the skin and wash gently with scrungie or a clean washcloth.  5.  Apply the CHG Soap to your body ONLY FROM THE NECK DOWN.  Do not use on open wounds or open sores.  Avoid contact with your eyes, ears, mouth and genitals (private parts).  Wash genitals (private parts with your normal soap.  6.  Wash thoroughly, paying special attention to the area where your surgery will be performed.  7.  Thoroughly rinse your body with warm water from the neck down.  8.  DO NOT shower/wash with your normal soap after using and rinsing off the CHG Soap.  9.  Pat yourself dry with a clean towel.  10.  Wear clean pajamas.            11.  Place clean sheets on your bed the night of your first shower and do not sleep with pets.  Day of Surgery  Do not apply any lotions/deodorants the morning of surgery.  Please wear clean clothes to the hospital/surgery center.   Please read over the following fact sheets that you were given: Pain Booklet, Coughing and Deep Breathing, Blood Transfusion Information, Total Joint Packet, MRSA Information and Surgical Site Infection Prevention

## 2013-09-18 ENCOUNTER — Encounter (HOSPITAL_COMMUNITY)
Admission: RE | Admit: 2013-09-18 | Discharge: 2013-09-18 | Disposition: A | Discharge status: Home / Self Care | Payer: Medicare Other | Source: Ambulatory Visit | Attending: Orthopedic Surgery | Admitting: Orthopedic Surgery

## 2013-09-18 ENCOUNTER — Encounter (HOSPITAL_COMMUNITY): Payer: Self-pay

## 2013-09-18 DIAGNOSIS — M1710 Unilateral primary osteoarthritis, unspecified knee: Secondary | ICD-10-CM | POA: Insufficient documentation

## 2013-09-18 DIAGNOSIS — Z01818 Encounter for other preprocedural examination: Secondary | ICD-10-CM | POA: Diagnosis present

## 2013-09-18 HISTORY — DX: Gastro-esophageal reflux disease without esophagitis: K21.9

## 2013-09-18 HISTORY — DX: Other specified postprocedural states: R11.2

## 2013-09-18 HISTORY — DX: Restless legs syndrome: G25.81

## 2013-09-18 HISTORY — DX: Lymphedema, not elsewhere classified: I89.0

## 2013-09-18 HISTORY — DX: Anemia, unspecified: D64.9

## 2013-09-18 HISTORY — DX: Personal history of other diseases of the digestive system: Z87.19

## 2013-09-18 HISTORY — DX: Other specified postprocedural states: Z98.890

## 2013-09-18 LAB — ABO/RH: ABO/RH(D): O POS

## 2013-09-18 LAB — SURGICAL PCR SCREEN
MRSA, PCR: NEGATIVE
STAPHYLOCOCCUS AUREUS: NEGATIVE

## 2013-09-18 LAB — CBC WITH DIFFERENTIAL/PLATELET
BASOS ABS: 0 10*3/uL (ref 0.0–0.1)
BASOS PCT: 0 % (ref 0–1)
Eosinophils Absolute: 0.2 10*3/uL (ref 0.0–0.7)
Eosinophils Relative: 3 % (ref 0–5)
HEMATOCRIT: 42.7 % (ref 36.0–46.0)
Hemoglobin: 14.6 g/dL (ref 12.0–15.0)
Lymphocytes Relative: 17 % (ref 12–46)
Lymphs Abs: 1 10*3/uL (ref 0.7–4.0)
MCH: 31.4 pg (ref 26.0–34.0)
MCHC: 34.2 g/dL (ref 30.0–36.0)
MCV: 91.8 fL (ref 78.0–100.0)
MONO ABS: 0.4 10*3/uL (ref 0.1–1.0)
Monocytes Relative: 7 % (ref 3–12)
NEUTROS ABS: 4.4 10*3/uL (ref 1.7–7.7)
Neutrophils Relative %: 73 % (ref 43–77)
PLATELETS: 183 10*3/uL (ref 150–400)
RBC: 4.65 MIL/uL (ref 3.87–5.11)
RDW: 13 % (ref 11.5–15.5)
WBC: 5.9 10*3/uL (ref 4.0–10.5)

## 2013-09-18 LAB — URINALYSIS, ROUTINE W REFLEX MICROSCOPIC
Bilirubin Urine: NEGATIVE
GLUCOSE, UA: NEGATIVE mg/dL
HGB URINE DIPSTICK: NEGATIVE
Ketones, ur: NEGATIVE mg/dL
Leukocytes, UA: NEGATIVE
Nitrite: NEGATIVE
PROTEIN: NEGATIVE mg/dL
Specific Gravity, Urine: 1.022 (ref 1.005–1.030)
Urobilinogen, UA: 0.2 mg/dL (ref 0.0–1.0)
pH: 5 (ref 5.0–8.0)

## 2013-09-18 LAB — TYPE AND SCREEN
ABO/RH(D): O POS
Antibody Screen: NEGATIVE

## 2013-09-18 LAB — BASIC METABOLIC PANEL
Anion gap: 13 (ref 5–15)
BUN: 24 mg/dL — ABNORMAL HIGH (ref 6–23)
CALCIUM: 9.5 mg/dL (ref 8.4–10.5)
CO2: 25 mEq/L (ref 19–32)
CREATININE: 0.91 mg/dL (ref 0.50–1.10)
Chloride: 102 mEq/L (ref 96–112)
GFR calc Af Amer: 73 mL/min — ABNORMAL LOW (ref >90)
GFR, EST NON AFRICAN AMERICAN: 63 mL/min — AB (ref >90)
Glucose, Bld: 88 mg/dL (ref 70–99)
Potassium: 4.6 mEq/L (ref 3.7–5.3)
Sodium: 140 mEq/L (ref 137–147)

## 2013-09-18 LAB — PROTIME-INR
INR: 1 (ref 0.00–1.49)
Prothrombin Time: 13.2 seconds (ref 11.6–15.2)

## 2013-09-18 LAB — APTT: aPTT: 30 seconds (ref 24–37)

## 2013-09-18 MED ORDER — CHLORHEXIDINE GLUCONATE 4 % EX LIQD: 60.0000 mL | Freq: Once | CUTANEOUS | Status: DC

## 2013-09-24 MED ORDER — DEXTROSE-NACL 5-0.45 % IV SOLN: INTRAVENOUS | Status: DC

## 2013-09-24 MED ORDER — CEFAZOLIN SODIUM 10 G IJ SOLR
3.0000 g | INTRAMUSCULAR | Status: AC
  Administered 2013-09-25: 3 g via INTRAVENOUS
  Filled 2013-09-24: qty 3000

## 2013-09-24 NOTE — H&P (Signed)
TOTAL KNEE ADMISSION H&P  Patient is being admitted for left total knee arthroplasty.  Subjective:  Chief Complaint:left knee pain.  HPI: Paige [name], 68 y.o. female, has a history of pain and functional disability in the left knee due to arthritis and has failed non-surgical conservative treatments for greater than 12 weeks to includeNSAID's and/or analgesics, corticosteriod injections, flexibility and strengthening excercises, use of assistive devices and activity modification.  Onset of symptoms was gradual, starting 5 years ago with gradually worsening course since that time. The patient noted no past surgery on the left knee(s).  Patient currently rates pain in the left knee(s) at 10 out of 10 with activity. Patient has night pain, worsening of pain with activity and weight bearing, pain that interferes with activities of daily living, pain with passive range of motion, crepitus and joint swelling.  Patient has evidence of periarticular osteophytes and joint space narrowing by imaging studies. There is no active infection.  There are no active problems to display for this patient.  Past Medical History  Diagnosis Date  . Arthritis   . Depression   . PONV (postoperative nausea and vomiting)     after Cholecysectomy  . Hypertension     not being treated  . Restless legs syndrome   . Lymphedema of both lower extremities   . GERD (gastroesophageal reflux disease)   . H/O hiatal hernia   . Anemia     Past Surgical History  Procedure Laterality Date  . Cholecystectomy    . Abdominal hysterectomy    . Eye surgery Bilateral     cataracts  . Fracture surgery Right     arm age 88  . Fracture surgery Bilateral     both thumbs  . Colonoscopy      No prescriptions prior to admission   No Known Allergies  History  Substance Use Topics  . Smoking status: Never Smoker   . Smokeless tobacco: Never Used  . Alcohol Use: Yes     Comment: rarely    No family history on file.    Review of Systems  Constitutional: Negative.   HENT: Negative.   Eyes: Negative.   Respiratory: Negative.   Cardiovascular: Negative.   Gastrointestinal: Negative.   Genitourinary: Negative.   Musculoskeletal: Positive for back pain and joint pain.  Skin: Negative.   Neurological: Negative.   Psychiatric/Behavioral: Negative.     Objective:  Physical Exam  Constitutional: She is oriented to person, place, and time. She appears well-developed and well-nourished.  HENT:  Head: Normocephalic and atraumatic.  Neck: Normal range of motion. Neck supple.  Cardiovascular: Intact distal pulses.   Respiratory: Effort normal.  Musculoskeletal: She exhibits tenderness.  Patient is a varus deformity, left greater than right knee.  Both knees have 5 flexion contractures and flex to 125 limited by adipose tissue.  There is crepitus along the medial joint line work.  She is exquisitely tender.  She walks with a left greater than right antalgic gait.  There is impressive subpatellar crepitus as well as you take her through a range of motion.  She has adequate quadriceps and hamstring power and she is ambulating without a cane.    Neurological: She is alert and oriented to person, place, and time.  Skin: Skin is warm and dry.  Psychiatric: She has a normal mood and affect. Her behavior is normal. Judgment and thought content normal.    Vital signs in last 24 hours:    Labs:   Estimated  body mass index is 46.78 kg/(m^2) as calculated from the following:   Height as of 02/02/12: 5\' 8"  (1.727 m).   Weight as of 02/02/12: 139.526 kg (307 lb 9.6 oz).   Imaging Review Plain radiographs demonstrate end-stage bone-on-bone arthritis of the medial and patellofemoral compartment, left greater than right knee.  Assessment/Plan:  End stage arthritis, left knee   The patient history, physical examination, clinical judgment of the provider and imaging studies are consistent with end stage  degenerative joint disease of the left knee(s) and total knee arthroplasty is deemed medically necessary. The treatment options including medical management, injection therapy arthroscopy and arthroplasty were discussed at length. The risks and benefits of total knee arthroplasty were presented and reviewed. The risks due to aseptic loosening, infection, stiffness, patella tracking problems, thromboembolic complications and other imponderables were discussed. The patient acknowledged the explanation, agreed to proceed with the plan and consent was signed. Patient is being admitted for inpatient treatment for surgery, pain control, PT, OT, prophylactic antibiotics, VTE prophylaxis, progressive ambulation and ADL's and discharge planning. The patient is planning to be discharged to skilled nursing facility

## 2013-09-25 ENCOUNTER — Encounter (HOSPITAL_COMMUNITY)
Admission: RE | Disposition: A | Discharge status: Skilled Nursing Facility | Payer: Self-pay | Source: Ambulatory Visit | Attending: Orthopedic Surgery

## 2013-09-25 ENCOUNTER — Encounter (HOSPITAL_COMMUNITY): Payer: Self-pay | Admitting: Certified Registered Nurse Anesthetist

## 2013-09-25 ENCOUNTER — Encounter (HOSPITAL_COMMUNITY): Payer: Medicare Other | Admitting: Certified Registered Nurse Anesthetist

## 2013-09-25 ENCOUNTER — Inpatient Hospital Stay (HOSPITAL_COMMUNITY)
Admission: RE | Admit: 2013-09-25 | Discharge: 2013-09-27 | DRG: 470 | Disposition: A | Discharge status: Skilled Nursing Facility | Payer: Medicare Other | Source: Ambulatory Visit | Attending: Orthopedic Surgery | Admitting: Orthopedic Surgery

## 2013-09-25 ENCOUNTER — Inpatient Hospital Stay (HOSPITAL_COMMUNITY): Payer: Medicare Other | Admitting: Certified Registered Nurse Anesthetist

## 2013-09-25 DIAGNOSIS — Z6841 Body Mass Index (BMI) 40.0 and over, adult: Secondary | ICD-10-CM | POA: Diagnosis not present

## 2013-09-25 DIAGNOSIS — M25569 Pain in unspecified knee: Secondary | ICD-10-CM | POA: Diagnosis present

## 2013-09-25 DIAGNOSIS — M1712 Unilateral primary osteoarthritis, left knee: Secondary | ICD-10-CM

## 2013-09-25 DIAGNOSIS — M1710 Unilateral primary osteoarthritis, unspecified knee: Principal | ICD-10-CM | POA: Diagnosis present

## 2013-09-25 HISTORY — PX: TOTAL KNEE ARTHROPLASTY: SHX125

## 2013-09-25 HISTORY — DX: Unilateral primary osteoarthritis, left knee: M17.12

## 2013-09-25 HISTORY — DX: Unilateral primary osteoarthritis, unspecified knee: M17.10

## 2013-09-25 SURGERY — ARTHROPLASTY, KNEE, TOTAL
Anesthesia: Regional | Laterality: Left

## 2013-09-25 MED ORDER — LACTATED RINGERS IV SOLN
INTRAVENOUS | Status: DC
  Administered 2013-09-25: 11:00:00 via INTRAVENOUS

## 2013-09-25 MED ORDER — ASPIRIN EC 325 MG PO TBEC: 325.0000 mg | DELAYED_RELEASE_TABLET | Freq: Two times a day (BID) | ORAL | Status: DC

## 2013-09-25 MED ORDER — BISACODYL 5 MG PO TBEC: 5.0000 mg | DELAYED_RELEASE_TABLET | Freq: Every day | ORAL | Status: DC | PRN

## 2013-09-25 MED ORDER — CEFUROXIME SODIUM 1.5 G IJ SOLR
INTRAMUSCULAR | Status: AC
  Filled 2013-09-25: qty 1.5

## 2013-09-25 MED ORDER — ACETAMINOPHEN 325 MG PO TABS
325.0000 mg | ORAL_TABLET | ORAL | Status: DC | PRN
Start: 2013-09-25 — End: 2013-09-25

## 2013-09-25 MED ORDER — MIDAZOLAM HCL 5 MG/5ML IJ SOLN
INTRAMUSCULAR | Status: DC | PRN
  Administered 2013-09-25: 0.5 mg via INTRAVENOUS
  Administered 2013-09-25: 1 mg via INTRAVENOUS
  Administered 2013-09-25: 0.5 mg via INTRAVENOUS

## 2013-09-25 MED ORDER — FENTANYL CITRATE 0.05 MG/ML IJ SOLN
INTRAMUSCULAR | Status: DC | PRN
  Administered 2013-09-25: 75 ug via INTRAVENOUS
  Administered 2013-09-25 (×3): 50 ug via INTRAVENOUS
  Administered 2013-09-25: 25 ug via INTRAVENOUS

## 2013-09-25 MED ORDER — TRANEXAMIC ACID 100 MG/ML IV SOLN
1000.0000 mg | INTRAVENOUS | Status: DC
  Filled 2013-09-25: qty 10

## 2013-09-25 MED ORDER — PAROXETINE HCL 20 MG PO TABS
40.0000 mg | ORAL_TABLET | Freq: Every day | ORAL | Status: DC
  Administered 2013-09-25 – 2013-09-27 (×3): 40 mg via ORAL
  Filled 2013-09-25 (×3): qty 2

## 2013-09-25 MED ORDER — BUPIVACAINE LIPOSOME 1.3 % IJ SUSP
INTRAMUSCULAR | Status: DC | PRN
  Administered 2013-09-25: 20 mL

## 2013-09-25 MED ORDER — METOCLOPRAMIDE HCL 10 MG PO TABS: 5.0000 mg | ORAL_TABLET | Freq: Three times a day (TID) | ORAL | Status: DC | PRN

## 2013-09-25 MED ORDER — PANTOPRAZOLE SODIUM 40 MG PO TBEC
40.0000 mg | DELAYED_RELEASE_TABLET | Freq: Every day | ORAL | Status: DC
  Administered 2013-09-25 – 2013-09-26 (×2): 40 mg via ORAL
  Filled 2013-09-25 (×2): qty 1

## 2013-09-25 MED ORDER — METHOCARBAMOL 500 MG PO TABS
500.0000 mg | ORAL_TABLET | Freq: Four times a day (QID) | ORAL | Status: DC | PRN
  Administered 2013-09-26 – 2013-09-27 (×5): 500 mg via ORAL
  Filled 2013-09-25 (×6): qty 1

## 2013-09-25 MED ORDER — ACETAMINOPHEN 160 MG/5ML PO SOLN
325.0000 mg | ORAL | Status: DC | PRN
  Filled 2013-09-25: qty 20.3

## 2013-09-25 MED ORDER — OXYCODONE HCL 5 MG PO TABS
5.0000 mg | ORAL_TABLET | ORAL | Status: DC | PRN
  Administered 2013-09-25 – 2013-09-27 (×11): 10 mg via ORAL
  Filled 2013-09-25 (×11): qty 2

## 2013-09-25 MED ORDER — ALPRAZOLAM 0.25 MG PO TABS
0.2500 mg | ORAL_TABLET | Freq: Every evening | ORAL | Status: DC | PRN
  Administered 2013-09-25 – 2013-09-26 (×2): 0.25 mg via ORAL
  Filled 2013-09-25 (×2): qty 1

## 2013-09-25 MED ORDER — ACETAMINOPHEN 650 MG RE SUPP: 650.0000 mg | Freq: Four times a day (QID) | RECTAL | Status: DC | PRN

## 2013-09-25 MED ORDER — OXYCODONE-ACETAMINOPHEN 5-325 MG PO TABS: 1.0000 | ORAL_TABLET | ORAL | Status: DC | PRN

## 2013-09-25 MED ORDER — ROPIVACAINE HCL 5 MG/ML IJ SOLN
INTRAMUSCULAR | Status: DC | PRN
  Administered 2013-09-25: 15 mL via EPIDURAL

## 2013-09-25 MED ORDER — ACETAMINOPHEN 325 MG PO TABS: 650.0000 mg | ORAL_TABLET | Freq: Four times a day (QID) | ORAL | Status: DC | PRN

## 2013-09-25 MED ORDER — OXYCODONE HCL 5 MG PO TABS
5.0000 mg | ORAL_TABLET | Freq: Once | ORAL | Status: AC | PRN
  Administered 2013-09-25: 5 mg via ORAL

## 2013-09-25 MED ORDER — PRAMIPEXOLE DIHYDROCHLORIDE 0.25 MG PO TABS
0.2500 mg | ORAL_TABLET | Freq: Two times a day (BID) | ORAL | Status: DC | PRN
  Administered 2013-09-25: 0.25 mg via ORAL
  Filled 2013-09-25 (×2): qty 1

## 2013-09-25 MED ORDER — ASPIRIN EC 325 MG PO TBEC
325.0000 mg | DELAYED_RELEASE_TABLET | Freq: Every day | ORAL | Status: DC
  Administered 2013-09-26 – 2013-09-27 (×2): 325 mg via ORAL
  Filled 2013-09-25 (×3): qty 1

## 2013-09-25 MED ORDER — ONDANSETRON HCL 4 MG/2ML IJ SOLN: 4.0000 mg | Freq: Four times a day (QID) | INTRAMUSCULAR | Status: DC | PRN

## 2013-09-25 MED ORDER — METOCLOPRAMIDE HCL 5 MG/ML IJ SOLN
INTRAMUSCULAR | Status: AC
  Administered 2013-09-25: 5 mg via INTRAVENOUS
  Filled 2013-09-25: qty 2

## 2013-09-25 MED ORDER — HYDROMORPHONE HCL PF 1 MG/ML IJ SOLN
INTRAMUSCULAR | Status: AC
  Administered 2013-09-25: 0.5 mg via INTRAVENOUS
  Filled 2013-09-25: qty 1

## 2013-09-25 MED ORDER — ONDANSETRON HCL 4 MG PO TABS: 4.0000 mg | ORAL_TABLET | Freq: Four times a day (QID) | ORAL | Status: DC | PRN

## 2013-09-25 MED ORDER — PROPOFOL 10 MG/ML IV BOLUS
INTRAVENOUS | Status: DC | PRN
  Administered 2013-09-25: 50 mg via INTRAVENOUS

## 2013-09-25 MED ORDER — METHOCARBAMOL 1000 MG/10ML IJ SOLN
500.0000 mg | Freq: Four times a day (QID) | INTRAVENOUS | Status: DC | PRN
  Administered 2013-09-25: 500 mg via INTRAVENOUS
  Filled 2013-09-25: qty 5

## 2013-09-25 MED ORDER — MIDAZOLAM HCL 2 MG/2ML IJ SOLN
INTRAMUSCULAR | Status: AC
  Administered 2013-09-25: 1 mg
  Filled 2013-09-25: qty 2

## 2013-09-25 MED ORDER — CEFUROXIME SODIUM 1.5 G IJ SOLR
INTRAMUSCULAR | Status: DC | PRN
  Administered 2013-09-25: 1.5 g

## 2013-09-25 MED ORDER — BUPIVACAINE LIPOSOME 1.3 % IJ SUSP
20.0000 mL | Freq: Once | INTRAMUSCULAR | Status: DC
  Filled 2013-09-25: qty 20

## 2013-09-25 MED ORDER — METOCLOPRAMIDE HCL 5 MG/ML IJ SOLN
5.0000 mg | Freq: Once | INTRAMUSCULAR | Status: AC
  Administered 2013-09-25: 5 mg via INTRAVENOUS

## 2013-09-25 MED ORDER — DOCUSATE SODIUM 100 MG PO CAPS
100.0000 mg | ORAL_CAPSULE | Freq: Two times a day (BID) | ORAL | Status: DC
  Administered 2013-09-25 – 2013-09-27 (×4): 100 mg via ORAL
  Filled 2013-09-25 (×7): qty 1

## 2013-09-25 MED ORDER — METHOCARBAMOL 500 MG PO TABS: 500.0000 mg | ORAL_TABLET | Freq: Two times a day (BID) | ORAL | Status: DC

## 2013-09-25 MED ORDER — HYDROMORPHONE HCL PF 1 MG/ML IJ SOLN
0.5000 mg | INTRAMUSCULAR | Status: DC | PRN
  Administered 2013-09-25 – 2013-09-27 (×9): 1 mg via INTRAVENOUS
  Filled 2013-09-25 (×9): qty 1

## 2013-09-25 MED ORDER — ALUM & MAG HYDROXIDE-SIMETH 200-200-20 MG/5ML PO SUSP: 30.0000 mL | ORAL | Status: DC | PRN

## 2013-09-25 MED ORDER — METOCLOPRAMIDE HCL 5 MG/ML IJ SOLN: 5.0000 mg | Freq: Three times a day (TID) | INTRAMUSCULAR | Status: DC | PRN

## 2013-09-25 MED ORDER — DEXTROSE 5 % IV SOLN: 3.0000 g | INTRAVENOUS | Status: DC | PRN

## 2013-09-25 MED ORDER — MENTHOL 3 MG MT LOZG: 1.0000 | LOZENGE | OROMUCOSAL | Status: DC | PRN

## 2013-09-25 MED ORDER — FENTANYL CITRATE 0.05 MG/ML IJ SOLN
INTRAMUSCULAR | Status: AC
  Filled 2013-09-25: qty 5

## 2013-09-25 MED ORDER — TRANEXAMIC ACID 100 MG/ML IV SOLN
1000.0000 mg | INTRAVENOUS | Status: DC | PRN
  Administered 2013-09-25: 1000 mg via INTRAVENOUS

## 2013-09-25 MED ORDER — PROPOFOL INFUSION 10 MG/ML OPTIME
INTRAVENOUS | Status: DC | PRN
  Administered 2013-09-25: 25 ug/kg/min via INTRAVENOUS

## 2013-09-25 MED ORDER — OXYCODONE HCL 5 MG PO TABS
ORAL_TABLET | ORAL | Status: AC
  Filled 2013-09-25: qty 1

## 2013-09-25 MED ORDER — FENTANYL CITRATE 0.05 MG/ML IJ SOLN
INTRAMUSCULAR | Status: AC
  Administered 2013-09-25: 50 ug
  Filled 2013-09-25: qty 2

## 2013-09-25 MED ORDER — BUPIVACAINE IN DEXTROSE 0.75-8.25 % IT SOLN
INTRATHECAL | Status: DC | PRN
  Administered 2013-09-25: 1.8 mL via INTRATHECAL

## 2013-09-25 MED ORDER — MIDAZOLAM HCL 2 MG/2ML IJ SOLN
INTRAMUSCULAR | Status: AC
  Filled 2013-09-25: qty 2

## 2013-09-25 MED ORDER — KCL IN DEXTROSE-NACL 20-5-0.45 MEQ/L-%-% IV SOLN
INTRAVENOUS | Status: DC
  Administered 2013-09-25: 22:00:00 via INTRAVENOUS
  Filled 2013-09-25 (×7): qty 1000

## 2013-09-25 MED ORDER — LACTATED RINGERS IV SOLN
INTRAVENOUS | Status: DC | PRN
  Administered 2013-09-25 (×2): via INTRAVENOUS

## 2013-09-25 MED ORDER — SODIUM CHLORIDE 0.9 % IJ SOLN
INTRAMUSCULAR | Status: DC | PRN
  Administered 2013-09-25 (×4): 10 mL

## 2013-09-25 MED ORDER — DIPHENHYDRAMINE HCL 12.5 MG/5ML PO ELIX: 12.5000 mg | ORAL_SOLUTION | ORAL | Status: DC | PRN

## 2013-09-25 MED ORDER — MIDAZOLAM HCL 2 MG/2ML IJ SOLN
1.0000 mg | INTRAMUSCULAR | Status: AC | PRN
  Administered 2013-09-25 (×2): 1 mg via INTRAVENOUS

## 2013-09-25 MED ORDER — HYDROMORPHONE HCL PF 1 MG/ML IJ SOLN
0.5000 mg | INTRAMUSCULAR | Status: DC | PRN
  Administered 2013-09-25 (×2): 0.5 mg via INTRAVENOUS

## 2013-09-25 MED ORDER — OXYCODONE HCL 5 MG/5ML PO SOLN: 5.0000 mg | Freq: Once | ORAL | Status: AC | PRN

## 2013-09-25 MED ORDER — SODIUM CHLORIDE 0.9 % IR SOLN
Status: DC | PRN
  Administered 2013-09-25: 1000 mL

## 2013-09-25 MED ORDER — MIDAZOLAM HCL 2 MG/2ML IJ SOLN
INTRAMUSCULAR | Status: AC
  Administered 2013-09-25: 1 mg via INTRAVENOUS
  Filled 2013-09-25: qty 2

## 2013-09-25 MED ORDER — FLEET ENEMA 7-19 GM/118ML RE ENEM: 1.0000 | ENEMA | Freq: Once | RECTAL | Status: AC | PRN

## 2013-09-25 MED ORDER — PHENOL 1.4 % MT LIQD: 1.0000 | OROMUCOSAL | Status: DC | PRN

## 2013-09-25 MED ORDER — HYDROMORPHONE HCL PF 1 MG/ML IJ SOLN
0.2500 mg | INTRAMUSCULAR | Status: DC | PRN
  Administered 2013-09-25 (×6): 0.5 mg via INTRAVENOUS

## 2013-09-25 MED ORDER — SENNOSIDES-DOCUSATE SODIUM 8.6-50 MG PO TABS: 1.0000 | ORAL_TABLET | Freq: Every evening | ORAL | Status: DC | PRN

## 2013-09-25 SURGICAL SUPPLY — 67 items
BANDAGE ESMARK 6X9 LF (GAUZE/BANDAGES/DRESSINGS) ×1 IMPLANT
BLADE SAG 18X100X1.27 (BLADE) ×2 IMPLANT
BLADE SAW SGTL 13X75X1.27 (BLADE) ×2 IMPLANT
BLADE SURG ROTATE 9660 (MISCELLANEOUS) IMPLANT
BNDG CMPR 9X6 STRL LF SNTH (GAUZE/BANDAGES/DRESSINGS) ×1
BNDG CMPR MED 10X6 ELC LF (GAUZE/BANDAGES/DRESSINGS) ×1
BNDG ELASTIC 6X10 VLCR STRL LF (GAUZE/BANDAGES/DRESSINGS) ×2 IMPLANT
BNDG ESMARK 6X9 LF (GAUZE/BANDAGES/DRESSINGS) ×2
BOWL SMART MIX CTS (DISPOSABLE) ×2 IMPLANT
CAP UPCHARGE REVISION TRAY ×1 IMPLANT
CAPT RP KNEE ×1 IMPLANT
CEMENT HV SMART SET (Cement) ×4 IMPLANT
COVER SURGICAL LIGHT HANDLE (MISCELLANEOUS) ×2 IMPLANT
CUFF TOURNIQUET SINGLE 34IN LL (TOURNIQUET CUFF) ×1 IMPLANT
CUFF TOURNIQUET SINGLE 44IN (TOURNIQUET CUFF) IMPLANT
DRAPE EXTREMITY T 121X128X90 (DRAPE) ×2 IMPLANT
DRAPE U-SHAPE 47X51 STRL (DRAPES) ×2 IMPLANT
DURAPREP 26ML APPLICATOR (WOUND CARE) ×4 IMPLANT
ELECT REM PT RETURN 9FT ADLT (ELECTROSURGICAL) ×2
ELECTRODE REM PT RTRN 9FT ADLT (ELECTROSURGICAL) ×1 IMPLANT
EVACUATOR 1/8 PVC DRAIN (DRAIN) ×2 IMPLANT
GAUZE SPONGE 4X4 12PLY STRL (GAUZE/BANDAGES/DRESSINGS) ×4 IMPLANT
GAUZE XEROFORM 1X8 LF (GAUZE/BANDAGES/DRESSINGS) ×2 IMPLANT
GLOVE BIO SURGEON STRL SZ 6 (GLOVE) ×1 IMPLANT
GLOVE BIO SURGEON STRL SZ7.5 (GLOVE) ×2 IMPLANT
GLOVE BIO SURGEON STRL SZ8.5 (GLOVE) ×2 IMPLANT
GLOVE BIOGEL PI IND STRL 6.5 (GLOVE) IMPLANT
GLOVE BIOGEL PI IND STRL 8 (GLOVE) ×1 IMPLANT
GLOVE BIOGEL PI IND STRL 9 (GLOVE) ×1 IMPLANT
GLOVE BIOGEL PI INDICATOR 6.5 (GLOVE) ×2
GLOVE BIOGEL PI INDICATOR 8 (GLOVE) ×1
GLOVE BIOGEL PI INDICATOR 9 (GLOVE) ×1
GLOVE SURG SS PI 6.5 STRL IVOR (GLOVE) ×1 IMPLANT
GOWN STRL REUS W/ TWL LRG LVL3 (GOWN DISPOSABLE) ×1 IMPLANT
GOWN STRL REUS W/ TWL XL LVL3 (GOWN DISPOSABLE) ×2 IMPLANT
GOWN STRL REUS W/TWL LRG LVL3 (GOWN DISPOSABLE) ×6
GOWN STRL REUS W/TWL XL LVL3 (GOWN DISPOSABLE) ×6
HANDPIECE INTERPULSE COAX TIP (DISPOSABLE) ×2
HOOD PEEL AWAY FACE SHEILD DIS (HOOD) ×4 IMPLANT
KIT BASIN OR (CUSTOM PROCEDURE TRAY) ×2 IMPLANT
KIT ROOM TURNOVER OR (KITS) ×2 IMPLANT
MANIFOLD NEPTUNE II (INSTRUMENTS) ×2 IMPLANT
NDL SAFETY ECLIPSE 18X1.5 (NEEDLE) IMPLANT
NDL SPNL 18GX3.5 QUINCKE PK (NEEDLE) IMPLANT
NEEDLE 22X1 1/2 (OR ONLY) (NEEDLE) ×2 IMPLANT
NEEDLE HYPO 18GX1.5 SHARP (NEEDLE) ×2
NEEDLE SPNL 18GX3.5 QUINCKE PK (NEEDLE) IMPLANT
NS IRRIG 1000ML POUR BTL (IV SOLUTION) ×2 IMPLANT
PACK TOTAL JOINT (CUSTOM PROCEDURE TRAY) ×2 IMPLANT
PAD ARMBOARD 7.5X6 YLW CONV (MISCELLANEOUS) ×4 IMPLANT
PADDING CAST COTTON 6X4 STRL (CAST SUPPLIES) ×2 IMPLANT
PATELLA DOME PFC 38MM (Knees) IMPLANT
SET HNDPC FAN SPRY TIP SCT (DISPOSABLE) ×1 IMPLANT
STAPLER VISISTAT 35W (STAPLE) ×2 IMPLANT
STEM UNIVERSAL 115X12MM (Stem) ×1 IMPLANT
SUCTION FRAZIER TIP 10 FR DISP (SUCTIONS) ×1 IMPLANT
SUT VIC AB 0 CTX 36 (SUTURE) ×4
SUT VIC AB 0 CTX36XBRD ANTBCTR (SUTURE) ×1 IMPLANT
SUT VIC AB 1 CTX 36 (SUTURE)
SUT VIC AB 1 CTX36XBRD ANBCTR (SUTURE) ×1 IMPLANT
SUT VIC AB 2-0 CT1 27 (SUTURE) ×2
SUT VIC AB 2-0 CT1 TAPERPNT 27 (SUTURE) ×1 IMPLANT
SYR 30ML LL (SYRINGE) ×1 IMPLANT
SYR 50ML LL SCALE MARK (SYRINGE) ×2 IMPLANT
TOWEL OR 17X24 6PK STRL BLUE (TOWEL DISPOSABLE) ×2 IMPLANT
TOWEL OR 17X26 10 PK STRL BLUE (TOWEL DISPOSABLE) ×2 IMPLANT
WATER STERILE IRR 1000ML POUR (IV SOLUTION) ×3 IMPLANT

## 2013-09-25 NOTE — Transfer of Care (Signed)
Immediate Anesthesia Transfer of Care Note  Patient: Paige [name]  Procedure(s) Performed: Procedure(s): TOTAL KNEE ARTHROPLASTY (Left)  Patient Location: PACU  Anesthesia Type:MAC and Spinal  Level of Consciousness: awake, alert  and oriented  Airway & Oxygen Therapy: Patient Spontanous Breathing  Post-op Assessment: Report given to PACU RN, Post -op Vital signs reviewed and stable, Patient moving all extremities and spinal level assessed L4  Post vital signs: Reviewed and stable  Complications: No apparent anesthesia complications

## 2013-09-25 NOTE — Anesthesia Preprocedure Evaluation (Addendum)
Anesthesia Evaluation  Patient identified by MRN, date of birth, ID band Patient awake    Reviewed: Allergy & Precautions, H&P , NPO status , Patient's Chart, lab work & pertinent test results  History of Anesthesia Complications (+) PONV and history of anesthetic complications  Airway Mallampati: III TM Distance: >3 FB Neck ROM: Full    Dental  (+) Dental Advisory Given   Pulmonary neg pulmonary ROS,  breath sounds clear to auscultation  Pulmonary exam normal       Cardiovascular Exercise Tolerance: Good hypertension, - angina- Past MI and - CHF Rhythm:Regular     Neuro/Psych PSYCHIATRIC DISORDERS Depression negative neurological ROS     GI/Hepatic Neg liver ROS, hiatal hernia, GERD-  Medicated and Controlled,  Endo/Other  Morbid obesity  Renal/GU negative Renal ROS     Musculoskeletal  (+) Arthritis -, Osteoarthritis,    Abdominal   Peds negative pediatric ROS (+)  Hematology  (+) anemia ,   Anesthesia Other Findings   Reproductive/Obstetrics                          Anesthesia Physical Anesthesia Plan  ASA: II  Anesthesia Plan: Regional and Spinal   Post-op Pain Management:    Induction: Intravenous  Airway Management Planned: Natural Airway  Additional Equipment: None  Intra-op Plan:   Post-operative Plan:   Informed Consent: I have reviewed the patients History and Physical, chart, labs and discussed the procedure including the risks, benefits and alternatives for the proposed anesthesia with the patient or authorized representative who has indicated his/her understanding and acceptance.   Dental advisory given  Plan Discussed with: CRNA and Surgeon  Anesthesia Plan Comments:         Anesthesia Quick Evaluation

## 2013-09-25 NOTE — Progress Notes (Signed)
Orthopedic Tech Progress Note Patient Details:  Paige [name] 04/27/1945 [id number]  CPM Left Knee CPM Left Knee: On Left Knee Flexion (Degrees): 40 Left Knee Extension (Degrees): 0 Additional Comments: put ohf on bed   Jennye Moccasin 09/25/2013, 4:18 PM

## 2013-09-25 NOTE — Interval H&P Note (Signed)
History and Physical Interval Note:  09/25/2013 12:13 PM  Paige [name]  has presented today for surgery, with the diagnosis of LEFT KNEE DEGENERATIVE JOINT DISEASE  The various methods of treatment have been discussed with the patient and family. After consideration of risks, benefits and other options for treatment, the patient has consented to  Procedure(s): TOTAL KNEE ARTHROPLASTY (Left) as a surgical intervention .  The patient's history has been reviewed, patient examined, no change in status, stable for surgery.  I have reviewed the patient's chart and labs.  Questions were answered to the patient's satisfaction.     Nestor Lewandowsky

## 2013-09-25 NOTE — Anesthesia Procedure Notes (Addendum)
Procedure Name: MAC Date/Time: 09/25/2013 12:54 PM Performed by: Orvilla Fus A Oxygen Delivery Method: Simple face mask Placement Confirmation: positive ETCO2    Spinal  Patient location during procedure: pre-op Start time: 09/25/2013 12:29 PM End time: 09/25/2013 12:36 PM Staffing Anesthesiologist: Jettson Crable, CHRIS Preanesthetic Checklist Completed: patient identified, surgical consent, pre-op evaluation, timeout performed, IV checked, risks and benefits discussed and monitors and equipment checked Spinal Block Patient position: sitting Prep: site prepped and draped and DuraPrep Patient monitoring: heart rate, cardiac monitor, continuous pulse ox and blood pressure Approach: midline Location: L4-5 Injection technique: single-shot Needle Needle type: Pencan  Needle gauge: 24 G Needle length: 10 cm Assessment Sensory level: T6  Anesthesia Regional Block:  Adductor canal block  Pre-Anesthetic Checklist: ,, timeout performed, Correct Patient, Correct Site, Correct Laterality, Correct Procedure, Correct Position, site marked, Risks and benefits discussed,  Surgical consent,  Pre-op evaluation,  At surgeon's request and post-op pain management  Laterality: Lower and Left  Prep: chloraprep       Needles:  Injection technique: Single-shot  Needle Type: Echogenic Needle          Additional Needles:  Procedures: ultrasound guided (picture in chart) Adductor canal block Narrative:  Start time: 09/25/2013 12:37 PM End time: 09/25/2013 12:43 PM Injection made incrementally with aspirations every 5 mL.  Performed by: Personally  Anesthesiologist: Izik Bingman  Additional Notes: H+P and labs reviewed, risks and benefits discussed with patient, procedure tolerated well without complications

## 2013-09-25 NOTE — Op Note (Signed)
PATIENT ID:      FATIMATA TALSMA  MRN:     [id number] DOB/AGE:    [birth year] / 68 y.o.       OPERATIVE REPORT    DATE OF PROCEDURE:  09/25/2013       PREOPERATIVE DIAGNOSIS:   LEFT KNEE DEGENERATIVE JOINT DISEASE      Estimated body mass index is 45.32 kg/(m^2) as calculated from the following:   Height as of this encounter:  (1.727 m).   Weight as of this encounter: 135.172 kg (298 lb).                                                        POSTOPERATIVE DIAGNOSIS:   LEFT KNEE DEGENERATIVE JOINT DISEASE                                                                      PROCEDURE:  Procedure(s): TOTAL KNEE ARTHROPLASTY Using Depuy Sigma RP implants #3L Femur, #3MBTTibia, 12x110 stem, 10mm Sigma RP bearing, 38 Patella     SURGEON: Wilhelmine Krogstad J    ASSISTANT:   Eric K. [employer]   (Present and scrubbed throughout the case, critical for assistance with exposure, retraction, instrumentation, and closure.)         ANESTHESIA: GET, ACB, Exparel  DRAINS: 2 medium hemovac in knee   TOURNIQUET TIME:   COMPLICATIONS:  None     SPECIMENS: None   INDICATIONS FOR PROCEDURE: The patient has  LEFT KNEE DEGENERATIVE JOINT DISEASE, varus deformities, XR shows bone on bone arthritis. Patient has failed all conservative measures including anti-inflammatory medicines, narcotics, attempts at  exercise and weight loss, cortisone injections and viscosupplementation.  Risks and benefits of surgery have been discussed, questions answered.   DESCRIPTION OF PROCEDURE: The patient identified by armband, received  IV antibiotics, in the holding area at [facility]. Patient taken to the operating room, appropriate anesthetic  monitors were attached, and general endotracheal anesthesia induced with  the patient in supine position, Foley catheter was inserted. Tourniquet  applied high to the operative thigh. Lateral post and foot positioner  applied to the table, the lower extremity was  then prepped and draped  in usual sterile fashion from the ankle to the tourniquet. Time-out procedure was performed. The limb was wrapped with an Esmarch bandage and the tourniquet inflated to 350 mmHg. We began the operation by making the anterior midline incision starting at handbreadth above the patella going over the patella 1 cm medial to and  4 cm distal to the tibial tubercle. Small bleeders in the skin and the  subcutaneous tissue identified and cauterized. Transverse retinaculum was incised and reflected medially and a medial parapatellar arthrotomy was accomplished. the patella was everted and theprepatellar fat pad resected. The superficial medial collateral  ligament was then elevated from anterior to posterior along the proximal  flare of the tibia and anterior half of the menisci resected. The knee was hyperflexed exposing bone on bone arthritis. Peripheral and notch osteophytes as well as the cruciate ligaments were then resected. We  continued to  work our way around posteriorly along the proximal tibia, and externally  rotated the tibia subluxing it out from underneath the femur. A McHale  retractor was placed through the notch and a lateral Hohmann retractor  placed, and we then drilled through the proximal tibia in line with the  axis of the tibia followed by an intramedullary guide rod and 2-degree  posterior slope cutting guide. The tibial cutting guide was pinned into place  allowing resection of 7 mm of bone medially and about 13 mm of bone  laterally because of her varus deformity. Satisfied with the tibial resection, we then  entered the distal femur 2 mm anterior to the PCL origin with the  intramedullary guide rod and applied the distal femoral cutting guide  set at 30mm, with 5 degrees of valgus. This was pinned along the  epicondylar axis. At this point, the distal femoral cut was accomplished without difficulty. We then sized for a #3L femoral component and pinned the  guide in 3 degrees of external rotation.The chamfer cutting guide was pinned into place. The anterior, posterior, and chamfer cuts were accomplished without difficulty followed by  the box cutting guide and the box cut. We also removed posterior osteophytes from the posterior femoral condyles. At this  time, the knee was brought into full extension. We checked our  extension and flexion gaps and found them symmetric at 84mm.  The patella thickness measured at 25 mm. We set the cutting guide at 15 and removed the posterior 10 mm  of the patella, sized for a 38 button and drilled the lollipop. The knee  was then once again hyperflexed exposing the proximal tibia. The proximal tibial bone was noted to be relatively soft we elected to place a stem. Using the axial reamers we reamed to the appropriate depth for 110 mm x 12 mm stem. We sized for a #3MBT tibial base plate, applied the smokestack and the conical reamer, with a 12 mm x 110 mm attachment. We then inserted a #3 MBT trial with a 12 mm x 110 mm stem. We hammered in place the delta fin keel. We then hammered into place the Sigma RP trial femoral component, inserted a 10-mm trial bearing, trial patellar button, and took the knee through range of motion from 0-130 degrees. No thumb pressure was required for patellar  tracking. At this point, all trial components were removed, a double batch of DePuy HV cement with 1500 mg of Zinacef was mixed and applied to all bony metallic mating surfaces except for the posterior condyles of the femur itself. In order, we  hammered into place the MBT tray and stem and removed excess cement, the femoral component and removed excess cement, a 10-mm Sigma RP bearing  was inserted, and the knee brought to full extension with compression.  The patellar button was clamped into place, and excess cement  removed. While the cement cured the wound was irrigated out with normal saline solution pulse lavage, and medium Hemovac  drains were placed from an anterolateral  approach. Ligament stability and patellar tracking were checked and found to be excellent. The parapatellar arthrotomy was closed with  running #1 Vicryl suture. The subcutaneous tissue with 0 and 2-0 undyed  Vicryl suture, and the skin with skin staples. A dressing of Xeroform,  4 x 4, dressing sponges, Webril, and Ace wrap applied. The patient  awakened, extubated, and taken to recovery room without difficulty.   [name] 09/25/2013, 2:36 PM

## 2013-09-25 NOTE — Progress Notes (Signed)
Time out done prior to spinal and nerve block with Dr. Maple Hudson. Patient tolerated well.

## 2013-09-25 NOTE — Anesthesia Postprocedure Evaluation (Signed)
  Anesthesia Post-op Note  Patient: Paige [name]  Procedure(s) Performed: Procedure(s): TOTAL KNEE ARTHROPLASTY (Left)  Patient Location: PACU  Anesthesia Type:General  Level of Consciousness: awake  Airway and Oxygen Therapy: Patient Spontanous Breathing  Post-op Pain: mild  Post-op Assessment: Post-op Vital signs reviewed  Post-op Vital Signs: Reviewed  Last Vitals:  Filed Vitals:   09/25/13 1530  BP: 124/72  Pulse: 60  Temp:   Resp: 12    Complications: No apparent anesthesia complications

## 2013-09-26 ENCOUNTER — Encounter (HOSPITAL_COMMUNITY): Payer: Self-pay | Admitting: Orthopedic Surgery

## 2013-09-26 LAB — BASIC METABOLIC PANEL
Anion gap: 11 (ref 5–15)
BUN: 11 mg/dL (ref 6–23)
CALCIUM: 8.5 mg/dL (ref 8.4–10.5)
CO2: 27 mEq/L (ref 19–32)
Chloride: 98 mEq/L (ref 96–112)
Creatinine, Ser: 0.72 mg/dL (ref 0.50–1.10)
GFR calc Af Amer: >90 mL/min (ref >90)
GFR, EST NON AFRICAN AMERICAN: 86 mL/min — AB (ref >90)
GLUCOSE: 120 mg/dL — AB (ref 70–99)
Potassium: 4.5 mEq/L (ref 3.7–5.3)
SODIUM: 136 meq/L — AB (ref 137–147)

## 2013-09-26 LAB — CBC
HCT: 40 % (ref 36.0–46.0)
Hemoglobin: 13.6 g/dL (ref 12.0–15.0)
MCH: 31.1 pg (ref 26.0–34.0)
MCHC: 34 g/dL (ref 30.0–36.0)
MCV: 91.5 fL (ref 78.0–100.0)
Platelets: 164 10*3/uL (ref 150–400)
RBC: 4.37 MIL/uL (ref 3.87–5.11)
RDW: 12.8 % (ref 11.5–15.5)
WBC: 9.7 10*3/uL (ref 4.0–10.5)

## 2013-09-26 NOTE — Progress Notes (Signed)
Patient ID: Paige [name], female   DOB: May 05, 1945, 68 y.o.   MRN: [id number] PATIENT ID: Paige [name]  MRN: [id number]  DOB/AGE:  12/07/1945 / 67 y.o.  1 Day Post-Op Procedure(s) (LRB): TOTAL KNEE ARTHROPLASTY (Left)    PROGRESS NOTE Subjective: Patient is alert, oriented, no Nausea, no Vomiting, yes passing gas, no Bowel Movement. Taking PO well. Denies SOB, Chest or Calf Pain. Using Incentive Spirometer, PAS in place. Ambulate WBAT, CPM 0-40 Patient reports pain as 6 on 0-10 scale  .    Objective: Vital signs in last 24 hours: Filed Vitals:   09/25/13 1858 09/25/13 2155 09/26/13 0200 09/26/13 0600  BP: 159/68 150/65 136/52 139/72  Pulse: 84 93 91 97  Temp: 98 F (36.7 C) 98.1 F (36.7 C) 98.4 F (36.9 C) 98.8 F (37.1 C)  TempSrc:      Resp: 16 16 16 16   Height:      Weight:      SpO2: 93% 99% 97% 94%      Intake/Output from previous day: I/O last 3 completed shifts: In: 2440 [P.O.:240; I.V.:2200] Out: 5 [Urine:5]   Intake/Output this shift: Total I/O In: -  Out: 1 [Urine:1]   LABORATORY DATA: No results found for this basename: WBC, HGB, HCT, PLT, NA, K, CL, CO2, BUN, CREATININE, GLUCOSE, GLUCAP, PT, INR, CALCIUM, 2,  in the last 72 hours  Examination: Neurologically intact ABD soft Neurovascular intact Sensation intact distally Intact pulses distally Dorsiflexion/Plantar flexion intact Incision: dressing C/D/I No cellulitis present Compartment soft}  Assessment:   1 Day Post-Op Procedure(s) (LRB): TOTAL KNEE ARTHROPLASTY (Left) ADDITIONAL DIAGNOSIS: Expected Acute Blood Loss Anemia, morbid obesity  Plan: PT/OT WBAT, CPM 5/hrs day until ROM 0-90 degrees, then D/C CPM DVT Prophylaxis:  SCDx72hrs, ASA 325 mg BID x 2 weeks DISCHARGE PLAN: Skilled Nursing Facility/Rehab, [facility] in [place] DISCHARGE NEEDS: HHPT, HHRN, CPM, Walker and 3-in-1 comode seat     Dixie Coppa J 09/26/2013, 7:57 AM

## 2013-09-26 NOTE — Evaluation (Signed)
Physical Therapy Evaluation Patient Details Name: Paige [name] MRN: [id number] DOB: 06-Apr-1945 Today's Date: 09/26/2013   History of Present Illness  pt presents with L TKA.    Clinical Impression  Pt very painful this am with RN arriving to give IV pain meds.  Rang for RN to inquire about PO meds instead of just IV.  Feel with better pain control, pt would be able to participate more with mobility.  Pt states she plans to D/C to [facility] for further Rehab.  Will continue to follow.      Follow Up Recommendations SNF    Equipment Recommendations  Rolling walker with 5" wheels    Recommendations for Other Services       Precautions / Restrictions Precautions Precautions: Fall Restrictions Weight Bearing Restrictions: Yes LLE Weight Bearing: Weight bearing as tolerated      Mobility  Bed Mobility Overal bed mobility: Needs Assistance Bed Mobility: Supine to Sit     Supine to sit: Mod assist;HOB elevated     General bed mobility comments: A with L LE and bringing trunk up to sitting.    Transfers Overall transfer level: Needs assistance Equipment used: Rolling walker (2 wheeled) Transfers: Sit to/from [employer] Sit to Stand: Mod assist Stand pivot transfers: Mod assist       General transfer comment: cues for UE sue, positioning LEs, step-by-step through transfer.  A with power up to stand, balance, and RW management during transfer.  Repeated transfer x2.    Ambulation/Gait                Stairs            Wheelchair Mobility    Modified Rankin (Stroke Patients Only)       Balance Overall balance assessment: Needs assistance         Standing balance support: Bilateral upper extremity supported Standing balance-Leahy Scale: Poor                               Pertinent Vitals/Pain Pain Assessment: 0-10 Pain Score: 9  Pain Location: L knee and calf Pain Descriptors / Indicators:  Aching;Constant;Sharp Pain Intervention(s): Limited activity within patient's tolerance;Repositioned;RN gave pain meds during session;Patient requesting pain meds-RN notified    Home Living Family/patient expects to be discharged to:: Skilled nursing facility [facility])                 Additional Comments: pt's husband new resident to [facility] as of July.      Prior Function Level of Independence: Independent               Hand Dominance        Extremity/Trunk Assessment   Upper Extremity Assessment: Defer to OT evaluation           Lower Extremity Assessment: LLE deficits/detail   LLE Deficits / Details: pt very painful with any movement of L LE and unable to tolerate ROM.    Cervical / Trunk Assessment: Normal  Communication   Communication: No difficulties  Cognition Arousal/Alertness: Awake/alert Behavior During Therapy: WFL for tasks assessed/performed Overall Cognitive Status: Within Functional Limits for tasks assessed                      General Comments      Exercises        Assessment/Plan    PT Assessment Patient needs continued  PT services  PT Diagnosis Abnormality of gait;Acute pain   PT Problem List Decreased strength;Decreased range of motion;Decreased activity tolerance;Decreased balance;Decreased mobility;Decreased knowledge of use of DME;Pain;Obesity  PT Treatment Interventions DME instruction;Gait training;Stair training;Therapeutic activities;Functional mobility training;Therapeutic exercise;Balance training;Patient/family education   PT Goals (Current goals can be found in the Care Plan section) Acute Rehab PT Goals Patient Stated Goal: Be more active.   PT Goal Formulation: With patient Time For Goal Achievement: 10/10/13 Potential to Achieve Goals: Good    Frequency 7X/week   Barriers to discharge        Co-evaluation               End of Session Equipment Utilized During Treatment:  Gait belt Activity Tolerance: Patient limited by pain Patient left: in chair;with call bell/phone within reach Nurse Communication: Mobility status;Patient requests pain meds         Time: 8295-6213 PT Time Calculation (min): 34 min   Charges:   PT Evaluation $Initial PT Evaluation Tier I: 1 Procedure PT Treatments $Therapeutic Activity: 23-37 mins   PT G CodesSunny Schlein, [place] [phone] 09/26/2013, 12:11 PM

## 2013-09-26 NOTE — Progress Notes (Signed)
Occupational Therapy Evaluation Patient Details Name: Paige [name] MRN: [id number] DOB: Feb 28, 1945 Today's Date: 09/26/2013    History of Present Illness pt presents with L TKA.     Clinical Impression   PTA pt lived at home alone and was independent with ADLs and functional mobility. Pt currently limited by severe surgical pain and declined OOB activities reporting she just sat back down after getting to [facility]. Education and training completed for fall prevention and energy conservation. Pt has planned d/c to [facility] and would benefit from skilled OT to increase functional mobility.     Follow Up Recommendations  SNF;Supervision/Assistance - 24 hour    Equipment Recommendations  Other (comment) (Defer to SNF)    Recommendations for Other Services       Precautions / Restrictions Precautions Precautions: Fall Restrictions Weight Bearing Restrictions: Yes LLE Weight Bearing: Weight bearing as tolerated      Mobility Bed Mobility               General bed mobility comments: Pt sitting up in recliner.   Transfers                 General transfer comment: Pt declined OOB activities due to pain.          ADL Overall ADL's : Needs assistance/impaired Eating/Feeding: Independent;Sitting   Grooming: Set up;Sitting   Upper Body Bathing: Set up;Sitting       Upper Body Dressing : Set up;Sitting                     General ADL Comments: Pt declined OOB activities due to pain this date. Pt plans to d/c to [facility] for ST rehab. Educated pt on use of leg lifter (gait belt, towel, etc) to reposition LLE in recliner and bed and to increase independence with bed mobility and pt reports understanding.      Vision  No apparent visual deficits.               Additional Comments: nystagmus noted in R eye, however pt asymptomatic and likely baseline   Perception Perception Perception Tested?: No   Praxis Praxis Praxis tested?: Within  functional limits    Pertinent Vitals/Pain Pain Assessment: 0-10 Pain Score: 9  Pain Location: L knee Pain Descriptors / Indicators: Aching;Constant;Sharp Pain Intervention(s): Premedicated before session;Monitored during session;Limited activity within patient's tolerance;Repositioned;Ice applied;Utilized relaxation techniques     Hand Dominance Right   Extremity/Trunk Assessment Upper Extremity Assessment Upper Extremity Assessment: Overall WFL for tasks assessed (reports minimal R shoulder arthritis)   Lower Extremity Assessment Lower Extremity Assessment: Defer to PT evaluation   Cervical / Trunk Assessment Cervical / Trunk Assessment: Normal   Communication Communication Communication: No difficulties   Cognition Arousal/Alertness: Awake/alert Behavior During Therapy: Anxious Overall Cognitive Status: Within Functional Limits for tasks assessed                                Home Living Family/patient expects to be discharged to:: Skilled nursing facility [facility])                                 Additional Comments: pt's husband new resident to [facility] as of July.        Prior Functioning/Environment Level of Independence: Independent  OT Diagnosis: Generalized weakness;Acute pain   OT Problem List: Decreased strength;Decreased range of motion;Decreased activity tolerance;Impaired balance (sitting and/or standing);Decreased knowledge of use of DME or AE;Decreased knowledge of precautions;Pain   OT Treatment/Interventions: Self-care/ADL training;Therapeutic exercise;Energy conservation;DME and/or AE instruction;Therapeutic activities;Patient/family education;Balance training    OT Goals(Current goals can be found in the care plan section) Acute Rehab OT Goals Patient Stated Goal: To get moving and less pain OT Goal Formulation: With patient Time For Goal Achievement: 10/03/13 Potential to Achieve  Goals: Good ADL Goals Pt Will Perform Grooming: with min guard assist;standing Pt Will Transfer to Toilet: with min guard assist;stand pivot transfer;bedside commode Pt Will Perform Toileting - Clothing Manipulation and hygiene: with min guard assist;sit to/from stand  OT Frequency: Min 1X/week    End of Session Equipment Utilized During Treatment: Gait belt CPM Left Knee CPM Left Knee: Off  Activity Tolerance: Patient limited by pain Patient left: in chair;with call bell/phone within reach   Time: 7282-0601 OT Time Calculation (min): 12 min Charges:  OT General Charges $OT Visit: 1 Procedure OT Evaluation $Initial OT Evaluation Tier I: 1 Procedure  Rae Lips [phone] 09/26/2013, 3:37 PM

## 2013-09-26 NOTE — Progress Notes (Signed)
Utilization review completed.  

## 2013-09-26 NOTE — Plan of Care (Signed)
Problem: Consults Goal: Diagnosis- Total Joint Replacement Primary Total Knee Left     

## 2013-09-26 NOTE — Clinical Social Work Note (Signed)
Clinical Social Work Department BRIEF PSYCHOSOCIAL ASSESSMENT 09/26/2013  Patient:  SULLY, MUTCHLER     Account Number:  [id number]     Admit date:  09/25/2013  Clinical Social Worker:  Cristy Folks  Date/Time:  09/26/2013 03:57 PM  Referred by:  Physician  Date Referred:  09/25/2013  Other Referral:   Interview type:  Patient Other interview type:    PSYCHOSOCIAL DATA Living Status:  ALONE Admitted from facility:   Level of care:   Primary support name:  Cristela Felt Primary support relationship to patient:  SPOUSE Degree of support available:   Good Support    CURRENT CONCERNS  Other Concerns:    SOCIAL WORK ASSESSMENT / PLAN Pt admitted to hospital for left total knee replacement. Pt reports that she lives alone. Pt reports that her husband is currently residing at [facility] in [place]. Pt is requesting to rehab at [place] [facility]. Pt has [employer] authorization information has been submitted.   Assessment/plan status:  Psychosocial Support/Ongoing Assessment of Needs Other assessment/ plan:   N/A   Information/referral to community resources:   N/A    PATIENT'S/FAMILY'S RESPONSE TO PLAN OF CARE: SW role explained, pt voiced understand, SW will continue to follow.    Derrell Lolling, MSW Clinical Social Worker [phone]

## 2013-09-26 NOTE — Care Management Note (Signed)
CARE MANAGEMENT NOTE 09/26/2013  Patient:  Paige [name], Paige [name]   Account Number:  [id number]  Date Initiated:  09/26/2013  Documentation initiated by:  Vance Peper  Subjective/Objective Assessment:   68 yr old female admitted with left knee DJD, s/p left total knee arthroplasty.     Action/Plan:   Patient is for shortterm rehab at SNF, will go to [facility]. Social worker is aware.   Anticipated DC Date:  09/27/2013   Anticipated DC Plan:  SKILLED NURSING FACILITY  In-house referral  Clinical Social Worker      DC Planning Services  CM consult      Choice offered to / List presented to:     DME arranged  NA        HH arranged  NA      Status of service:  Completed, signed off Medicare Important Message given?  NA - LOS <3 / Initial given by admissions (If response is "NO", the following Medicare IM given date fields will be blank) Date Medicare IM given:   Medicare IM given by:   Date Additional Medicare IM given:   Additional Medicare IM given by:    Discharge Disposition:  SKILLED NURSING FACILITY  Per UR Regulation:  Reviewed for med. necessity/level of care/duration of stay  If discussed at Long Length of Stay Meetings, dates discussed:

## 2013-09-27 LAB — CBC
HCT: 39.9 % (ref 36.0–46.0)
Hemoglobin: 13.8 g/dL (ref 12.0–15.0)
MCH: 31.7 pg (ref 26.0–34.0)
MCHC: 34.6 g/dL (ref 30.0–36.0)
MCV: 91.7 fL (ref 78.0–100.0)
PLATELETS: 151 10*3/uL (ref 150–400)
RBC: 4.35 MIL/uL (ref 3.87–5.11)
RDW: 13.1 % (ref 11.5–15.5)
WBC: 10.6 10*3/uL — AB (ref 4.0–10.5)

## 2013-09-27 NOTE — Discharge Summary (Signed)
Patient ID: RAMSHA LONIGRO MRN: [id number] DOB/AGE: [birth year] 68 y.o.  Admit date: 09/25/2013 Discharge date: 09/27/2013  Admission Diagnoses:  Principal Problem:   Arthritis of knee, left Active Problems:   Arthritis of knee   Discharge Diagnoses:  Same  Past Medical History  Diagnosis Date  . Arthritis   . Depression   . PONV (postoperative nausea and vomiting)     after Cholecysectomy  . Hypertension     not being treated  . Restless legs syndrome   . Lymphedema of both lower extremities   . GERD (gastroesophageal reflux disease)   . H/O hiatal hernia   . Anemia     Surgeries: Procedure(s): TOTAL KNEE ARTHROPLASTY on 09/25/2013   Consultants:    Discharged Condition: Improved  Hospital Course: VELMER BROADFOOT is an 68 y.o. female who was admitted 09/25/2013 for operative treatment ofArthritis of knee, left. Patient has severe unremitting pain that affects sleep, daily activities, and work/hobbies. After pre-op clearance the patient was taken to the operating room on 09/25/2013 and underwent  Procedure(s): TOTAL KNEE ARTHROPLASTY.    Patient was given perioperative antibiotics: Anti-infectives   Start     Dose/Rate Route Frequency Ordered Stop   09/25/13 1406  cefUROXime (ZINACEF) injection  Status:  Discontinued       As needed 09/25/13 1406 09/25/13 1508   09/25/13 0600  ceFAZolin (ANCEF) 3 g in dextrose 5 % 50 mL IVPB     3 g 160 mL/hr over 30 Minutes Intravenous On call to O.R. 09/24/13 1415 09/25/13 1246       Patient was given sequential compression devices, early ambulation, and chemoprophylaxis to prevent DVT.  Patient benefited maximally from hospital stay and there were no complications.    Recent vital signs: Patient Vitals for the past 24 hrs:  BP Temp Pulse Resp SpO2  09/27/13 0536 131/64 mmHg 98.2 F (36.8 C) 94 18 98 %  09/27/13 0400 - - - 18 97 %  09/26/13 2358 - - - 18 95 %  09/26/13 2112 128/56 mmHg 99.8 F (37.7 C) 95 18 93 %  09/26/13  2000 - - - 18 93 %  09/26/13 1430 145/54 mmHg 98.9 F (37.2 C) 97 18 95 %  09/26/13 1200 - - - 19 96 %     Recent laboratory studies:  Recent Labs  09/26/13 0727 09/27/13 0420  WBC 9.7 10.6*  HGB 13.6 13.8  HCT 40.0 39.9  PLT 164 151  NA 136*  --   K 4.5  --   CL 98  --   CO2 27  --   BUN 11  --   CREATININE 0.72  --   GLUCOSE 120*  --   CALCIUM 8.5  --      Discharge Medications:     Medication List    STOP taking these medications       HYDROcodone-acetaminophen 5-325 MG per tablet  Commonly known as:  NORCO/VICODIN     naproxen 500 MG tablet  Commonly known as:  NAPROSYN      TAKE these medications       aspirin EC 325 MG tablet  Take 1 tablet (325 mg total) by mouth 2 (two) times daily.     methocarbamol 500 MG tablet  Commonly known as:  ROBAXIN  Take 1 tablet (500 mg total) by mouth 2 (two) times daily with a meal.     MIRAPEX PO  Take by mouth daily.     pramipexole 0.25  MG tablet  Commonly known as:  MIRAPEX  Take 0.25 mg by mouth 2 (two) times daily as needed (for restless legs).     oxyCODONE-acetaminophen 5-325 MG per tablet  Commonly known as:  ROXICET  Take 1 tablet by mouth every 4 (four) hours as needed.     pantoprazole 40 MG tablet  Commonly known as:  PROTONIX  Take 40 mg by mouth at bedtime.     PARoxetine 40 MG tablet  Commonly known as:  PAXIL  Take 40 mg by mouth every morning.     XANAX 0.25 MG tablet  Generic drug:  ALPRAZolam  Take 0.25 mg by mouth at bedtime as needed for sleep.        Diagnostic Studies: Dg Chest 2 View  09/18/2013   CLINICAL DATA:  Preop for left knee arthroplasty  EXAM: CHEST  2 VIEW  COMPARISON:  01/01/2012  FINDINGS: Cardiomediastinal silhouette is stable. No acute infiltrate or pleural effusion. No pulmonary edema. Degenerative changes thoracic spine.  IMPRESSION: No active cardiopulmonary disease.   Electronically Signed   By: Natasha Mead M.D.   On: 09/18/2013 13:56    Disposition:        Discharge Instructions   CPM    Complete by:  As directed   Continuous passive motion machine (CPM):      Use the CPM from 0 to 60  for 5 hours per day.      You may increase by 10 degrees per day.  You may break it up into 2 or 3 sessions per day.      Use CPM for 2 weeks or until you are told to stop.     Call MD / Call 911    Complete by:  As directed   If you experience chest pain or shortness of breath, CALL 911 and be transported to the hospital emergency room.  If you develope a fever above 101 F, pus (white drainage) or increased drainage or redness at the wound, or calf pain, call your surgeon's office.     Change dressing    Complete by:  As directed   Change dressing on 5, then change the dressing daily with sterile 4 x 4 inch gauze dressing and apply TED hose.  You may clean the incision with alcohol prior to redressing.     Constipation Prevention    Complete by:  As directed   Drink plenty of fluids.  Prune juice may be helpful.  You may use a stool softener, such as Colace (over the counter) 100 mg twice a day.  Use MiraLax (over the counter) for constipation as needed.     Diet - low sodium heart healthy    Complete by:  As directed      Discharge instructions    Complete by:  As directed   Follow up in office with Dr. Turner Daniels in 2 weeks.     Driving restrictions    Complete by:  As directed   No driving for 2 weeks     Increase activity slowly as tolerated    Complete by:  As directed      Patient may shower    Complete by:  As directed   You may shower without a dressing once there is no drainage.  Do not wash over the wound.  If drainage remains, cover wound with plastic wrap and then shower.           Follow-up Information   Follow  up with Nestor Lewandowsky, MD.   Specialty:  Orthopedic Surgery   Contact information:   [address] [place] [address] [phone]        Signed: Vear Clock, Lorilynn Lehr R 09/27/2013, 9:45 AM

## 2013-09-27 NOTE — Progress Notes (Signed)
Patient for discharge to SNF bed at [facility] today. Plans confirmed with patient and family who are in agreement. SNF is prepared for patient and we will  plan transfer via [facility] .    Derrell Lolling, MSW Clinical Social Worker [phone]

## 2013-09-27 NOTE — Progress Notes (Signed)
Physical Therapy Treatment Patient Details Name: Paige [name] MRN: [id number] DOB: 1945/09/18 Today's Date: 09/27/2013    History of Present Illness pt presents with L TKA.      PT Comments    Patient had just gotten back to recliner with nursing staff but she was agreeable to work on therex. Reviewed HEP with patient. Has increased pain with extension exercises. Patient is planning to DC to SNF today at 12.  Follow Up Recommendations  SNF     Equipment Recommendations  Rolling walker with 5" wheels    Recommendations for Other Services       Precautions / Restrictions Precautions Precautions: Fall Restrictions LLE Weight Bearing: Weight bearing as tolerated    Mobility  Bed Mobility                  Transfers                    Ambulation/Gait                 Stairs            Wheelchair Mobility    Modified Rankin (Stroke Patients Only)       Balance                                    Cognition Arousal/Alertness: Awake/alert Behavior During Therapy: WFL for tasks assessed/performed Overall Cognitive Status: Within Functional Limits for tasks assessed                      Exercises Total Joint Exercises Quad Sets: AROM;Left;10 reps Short Arc QuadBarbaraann Boys;Left;10 reps Heel Slides: AAROM;Left;10 reps Hip ABduction/ADduction: AAROM;Left;10 reps Straight Leg Raises: AAROM;Left;10 reps    General Comments        Pertinent Vitals/Pain Pain Assessment: 0-10 Pain Score: 7  Pain Location: L knee with therex Pain Descriptors / Indicators: Aching Pain Intervention(s): Monitored during session;Limited activity within patient's tolerance    Home Living                      Prior Function            PT Goals (current goals can now be found in the care plan section) Progress towards PT goals: Progressing toward goals    Frequency  7X/week    PT Plan Current plan remains appropriate     Co-evaluation             End of Session   Activity Tolerance: Patient limited by pain Patient left: in chair;with call bell/phone within reach     Time: 5859-2924 PT Time Calculation (min): 16 min  Charges:  $Therapeutic Exercise: 8-22 mins                    G Codes:      Fredrich Birks 09/27/2013, 10:46 AM 09/27/2013 Fredrich Birks PTA [phone] pager [phone] office

## 2013-09-27 NOTE — Progress Notes (Signed)
PATIENT ID: Paige [name]  MRN: [id number]  DOB/AGE:  [birth year] / 68 y.o.  2 Days Post-Op Procedure(s) (LRB): TOTAL KNEE ARTHROPLASTY (Left)    PROGRESS NOTE Subjective: Patient is alert, oriented, no Nausea, no Vomiting, yes passing gas, no Bowel Movement. Taking PO well. Denies SOB, Chest or Calf Pain. Using Incentive Spirometer, PAS in place. Ambulate WBAT, CPM 0-40 Patient reports pain as 7 on 0-10 scale  .    Objective: Vital signs in last 24 hours: Filed Vitals:   09/26/13 2112 09/26/13 2358 09/27/13 0400 09/27/13 0536  BP: 128/56   131/64  Pulse: 95   94  Temp: 99.8 F (37.7 C)   98.2 F (36.8 C)  TempSrc:      Resp: 18 18 18 18   Height:      Weight:      SpO2: 93% 95% 97% 98%      Intake/Output from previous day: I/O last 3 completed shifts: In: 1481 [P.O.:481; I.V.:1000] Out: 5 [Urine:5]   Intake/Output this shift:     LABORATORY DATA:  Recent Labs  09/26/13 0727 09/27/13 0420  WBC 9.7 10.6*  HGB 13.6 13.8  HCT 40.0 39.9  PLT 164 151  NA 136*  --   K 4.5  --   CL 98  --   CO2 27  --   BUN 11  --   CREATININE 0.72  --   GLUCOSE 120*  --   CALCIUM 8.5  --     Examination: Neurologically intact Neurovascular intact Sensation intact distally Intact pulses distally Dorsiflexion/Plantar flexion intact Incision: dressing C/D/I No cellulitis present Compartment soft}  Assessment:   2 Days Post-Op Procedure(s) (LRB): TOTAL KNEE ARTHROPLASTY (Left) ADDITIONAL DIAGNOSIS: Expected Acute Blood Loss Anemia, morbid obesity  Plan: PT/OT WBAT, CPM 5/hrs day until ROM 0-90 degrees, then D/C CPM DVT Prophylaxis:  SCDx72hrs, ASA 325 mg BID x 2 weeks DISCHARGE PLAN: Skilled Nursing Facility/Rehab, [facility] in [place] when pt passes therapy DISCHARGE NEEDS: HHPT, HHRN, CPM, Walker and 3-in-1 comode seat     Esha Fincher R 09/27/2013, 9:40 AM

## 2013-10-15 ENCOUNTER — Ambulatory Visit: Payer: Medicare Other | Attending: Family Medicine | Admitting: Physical Therapy

## 2013-10-15 DIAGNOSIS — M25569 Pain in unspecified knee: Secondary | ICD-10-CM | POA: Diagnosis not present

## 2013-10-15 DIAGNOSIS — M545 Low back pain, unspecified: Secondary | ICD-10-CM | POA: Diagnosis not present

## 2013-10-15 DIAGNOSIS — IMO0001 Reserved for inherently not codable concepts without codable children: Secondary | ICD-10-CM | POA: Diagnosis not present

## 2013-10-21 ENCOUNTER — Ambulatory Visit: Payer: Medicare Other | Admitting: Physical Therapy

## 2013-10-21 DIAGNOSIS — IMO0001 Reserved for inherently not codable concepts without codable children: Secondary | ICD-10-CM | POA: Diagnosis not present

## 2013-10-23 ENCOUNTER — Ambulatory Visit: Payer: Medicare Other | Admitting: Physical Therapy

## 2013-10-23 DIAGNOSIS — IMO0001 Reserved for inherently not codable concepts without codable children: Secondary | ICD-10-CM | POA: Diagnosis not present

## 2013-10-28 ENCOUNTER — Ambulatory Visit: Payer: Medicare Other | Attending: Family Medicine | Admitting: Physical Therapy

## 2013-10-28 DIAGNOSIS — M25662 Stiffness of left knee, not elsewhere classified: Secondary | ICD-10-CM | POA: Insufficient documentation

## 2013-10-28 DIAGNOSIS — R609 Edema, unspecified: Secondary | ICD-10-CM | POA: Diagnosis not present

## 2013-10-28 DIAGNOSIS — M25562 Pain in left knee: Secondary | ICD-10-CM | POA: Insufficient documentation

## 2013-10-28 DIAGNOSIS — Z96652 Presence of left artificial knee joint: Secondary | ICD-10-CM | POA: Insufficient documentation

## 2013-10-28 DIAGNOSIS — M549 Dorsalgia, unspecified: Secondary | ICD-10-CM | POA: Diagnosis not present

## 2013-10-31 ENCOUNTER — Ambulatory Visit: Payer: Medicare Other | Admitting: Physical Therapy

## 2013-10-31 DIAGNOSIS — M25562 Pain in left knee: Secondary | ICD-10-CM | POA: Diagnosis not present

## 2013-11-06 ENCOUNTER — Ambulatory Visit: Payer: Medicare Other | Admitting: Physical Therapy

## 2013-11-06 DIAGNOSIS — M25562 Pain in left knee: Secondary | ICD-10-CM | POA: Diagnosis not present

## 2013-11-08 ENCOUNTER — Ambulatory Visit: Payer: Medicare Other | Admitting: Physical Therapy

## 2013-11-12 ENCOUNTER — Ambulatory Visit: Payer: Medicare Other | Admitting: Physical Therapy

## 2013-11-14 ENCOUNTER — Ambulatory Visit: Payer: Medicare Other | Admitting: Physical Therapy

## 2013-12-10 ENCOUNTER — Other Ambulatory Visit: Payer: Self-pay | Admitting: Family Medicine

## 2013-12-10 DIAGNOSIS — Z1231 Encounter for screening mammogram for malignant neoplasm of breast: Secondary | ICD-10-CM

## 2014-01-01 ENCOUNTER — Ambulatory Visit
Admission: RE | Admit: 2014-01-01 | Discharge: 2014-01-01 | Disposition: A | Discharge status: Home / Self Care | Payer: Medicare Other | Source: Ambulatory Visit | Attending: Family Medicine | Admitting: Family Medicine

## 2014-01-01 DIAGNOSIS — Z1231 Encounter for screening mammogram for malignant neoplasm of breast: Secondary | ICD-10-CM

## 2014-01-15 ENCOUNTER — Other Ambulatory Visit: Payer: Self-pay | Admitting: Orthopedic Surgery

## 2014-01-31 ENCOUNTER — Encounter (HOSPITAL_COMMUNITY)
Admission: RE | Admit: 2014-01-31 | Discharge: 2014-01-31 | Disposition: A | Discharge status: Home / Self Care | Payer: Medicare Other | Source: Ambulatory Visit | Attending: Orthopedic Surgery | Admitting: Orthopedic Surgery

## 2014-01-31 ENCOUNTER — Encounter (HOSPITAL_COMMUNITY): Payer: Self-pay

## 2014-01-31 DIAGNOSIS — Z01812 Encounter for preprocedural laboratory examination: Secondary | ICD-10-CM | POA: Insufficient documentation

## 2014-01-31 LAB — URINALYSIS, ROUTINE W REFLEX MICROSCOPIC
Bilirubin Urine: NEGATIVE
Glucose, UA: NEGATIVE mg/dL
Hgb urine dipstick: NEGATIVE
KETONES UR: NEGATIVE mg/dL
Leukocytes, UA: NEGATIVE
Nitrite: NEGATIVE
Protein, ur: NEGATIVE mg/dL
Specific Gravity, Urine: 1.015 (ref 1.005–1.030)
Urobilinogen, UA: 1 mg/dL (ref 0.0–1.0)
pH: 6 (ref 5.0–8.0)

## 2014-01-31 LAB — CBC WITH DIFFERENTIAL/PLATELET
Basophils Absolute: 0 10*3/uL (ref 0.0–0.1)
Basophils Relative: 0 % (ref 0–1)
Eosinophils Absolute: 0.1 10*3/uL (ref 0.0–0.7)
Eosinophils Relative: 2 % (ref 0–5)
HCT: 45.8 % (ref 36.0–46.0)
HEMOGLOBIN: 14.9 g/dL (ref 12.0–15.0)
Lymphocytes Relative: 18 % (ref 12–46)
Lymphs Abs: 1.2 10*3/uL (ref 0.7–4.0)
MCH: 30.3 pg (ref 26.0–34.0)
MCHC: 32.5 g/dL (ref 30.0–36.0)
MCV: 93.1 fL (ref 78.0–100.0)
Monocytes Absolute: 0.5 10*3/uL (ref 0.1–1.0)
Monocytes Relative: 7 % (ref 3–12)
Neutro Abs: 5 10*3/uL (ref 1.7–7.7)
Neutrophils Relative %: 73 % (ref 43–77)
PLATELETS: 218 10*3/uL (ref 150–400)
RBC: 4.92 MIL/uL (ref 3.87–5.11)
RDW: 14.3 % (ref 11.5–15.5)
WBC: 6.9 10*3/uL (ref 4.0–10.5)

## 2014-01-31 LAB — BASIC METABOLIC PANEL
Anion gap: 7 (ref 5–15)
BUN: 20 mg/dL (ref 6–23)
CALCIUM: 9.3 mg/dL (ref 8.4–10.5)
CHLORIDE: 102 meq/L (ref 96–112)
CO2: 30 mmol/L (ref 19–32)
CREATININE: 0.91 mg/dL (ref 0.50–1.10)
GFR, EST AFRICAN AMERICAN: 73 mL/min — AB (ref >90)
GFR, EST NON AFRICAN AMERICAN: 63 mL/min — AB (ref >90)
GLUCOSE: 93 mg/dL (ref 70–99)
Potassium: 4.8 mmol/L (ref 3.5–5.1)
Sodium: 139 mmol/L (ref 135–145)

## 2014-01-31 LAB — TYPE AND SCREEN
ABO/RH(D): O POS
Antibody Screen: NEGATIVE

## 2014-01-31 LAB — PROTIME-INR
INR: 1.04 (ref 0.00–1.49)
Prothrombin Time: 13.7 seconds (ref 11.6–15.2)

## 2014-01-31 LAB — SURGICAL PCR SCREEN
MRSA, PCR: NEGATIVE
Staphylococcus aureus: NEGATIVE

## 2014-01-31 LAB — APTT: aPTT: 31 seconds (ref 24–37)

## 2014-01-31 NOTE — Pre-Procedure Instructions (Signed)
Paige [name]  01/31/2014   Your procedure is scheduled on:   Monday  02/10/14  Report to [facility] Admitting at 750 AM.  Call this number if you have problems the morning of surgery: [phone]   Remember:   Do not eat food or drink liquids after midnight.   Take these medicines the morning of surgery with A SIP OF WATER:  OCYCODONE IF NEEDED, OXYBUTYNIN, PAROXETINE(PAXIL)  (STOP ASPIRIN, METHOCARBAMOL/ROBAXIN)   Do not wear jewelry, make-up or nail polish.  Do not wear lotions, powders, or perfumes. You may wear deodorant.  Do not shave 48 hours prior to surgery. Men may shave face and neck.  Do not bring valuables to the hospital.  [facility] is not responsible                  for any belongings or valuables.               Contacts, dentures or bridgework may not be worn into surgery.  Leave suitcase in the car. After surgery it may be brought to your room.  For patients admitted to the hospital, discharge time is determined by your                treatment team.               Patients discharged the day of surgery will not be allowed to drive  home.  Name and phone number of your driver:   Special Instructions: Shower using CHG 2 nights before surgery and the night before surgery.  If you shower the day of surgery use CHG.  Use special wash - you have one bottle of CHG for all showers.  You should use approximately 1/3 of the bottle for each shower.   Please read over the following fact sheets that you were given: Pain Booklet, Coughing and Deep Breathing, Blood Transfusion Information, Total Joint Packet, MRSA Information and Surgical Site Infection Prevention

## 2014-02-07 NOTE — H&P (Signed)
TOTAL KNEE ADMISSION H&P  Patient is being admitted for right total knee arthroplasty.  Subjective:  Chief Complaint:right knee pain.  HPI: Paige [name], 69 y.o. female, has a history of pain and functional disability in the right knee due to arthritis and has failed non-surgical conservative treatments for greater than 12 weeks to includeNSAID's and/or analgesics, flexibility and strengthening excercises, use of assistive devices, weight reduction as appropriate and activity modification.  Onset of symptoms was gradual, starting several years ago with gradually worsening course since that time. The patient noted no past surgery on the right knee(s).  Patient currently rates pain in the right knee(s) at 10 out of 10 with activity. Patient has night pain, worsening of pain with activity and weight bearing, pain that interferes with activities of daily living, pain with passive range of motion, crepitus and joint swelling.  Patient has evidence of joint subluxation and joint space narrowing by imaging studies.. There is no active infection.  Patient Active Problem List   Diagnosis Date Noted  . Arthritis of knee, left 09/25/2013  . Arthritis of knee 09/25/2013   Past Medical History  Diagnosis Date  . Arthritis   . Depression   . Hypertension     not being treated  . Restless legs syndrome   . Lymphedema of both lower extremities   . GERD (gastroesophageal reflux disease)   . H/O hiatal hernia   . Anemia   . PONV (postoperative nausea and vomiting)     after Cholecysectomy    Past Surgical History  Procedure Laterality Date  . Cholecystectomy    . Abdominal hysterectomy    . Eye surgery Bilateral     cataracts  . Fracture surgery Right     arm age 63  . Fracture surgery Bilateral     both thumbs  . Colonoscopy    . Total knee arthroplasty Left 09/25/2013    Procedure: TOTAL KNEE ARTHROPLASTY;  Surgeon: Nestor Lewandowsky, MD;  Location: [facility] OR;  Service: Orthopedics;  Laterality:  Left;  . Laparoscopic gastric banding      2010    No prescriptions prior to admission   No Known Allergies  History  Substance Use Topics  . Smoking status: Never Smoker   . Smokeless tobacco: Never Used  . Alcohol Use: Yes     Comment: rarely    No family history on file.   Review of Systems  Constitutional: Positive for malaise/fatigue and diaphoresis.  Cardiovascular: Positive for leg swelling.  Gastrointestinal: Positive for diarrhea.  Genitourinary: Positive for frequency.  Musculoskeletal: Positive for myalgias and joint pain.  Neurological: Positive for dizziness.  Psychiatric/Behavioral: Positive for depression.    Objective:  Physical Exam  Constitutional: She is oriented to person, place, and time. She appears well-developed and well-nourished.  HENT:  Head: Normocephalic and atraumatic.  Eyes: Pupils are equal, round, and reactive to light.  Neck: Normal range of motion. Neck supple.  Cardiovascular: Intact distal pulses.   Respiratory: Effort normal.  Musculoskeletal: She exhibits tenderness.  The right knee also comes to full extension. There is a lot of subpatellar crepitus and pain along the medial joint line to palpation.  Collateral ligaments at one plus laxity to valgus stress.  Good quadriceps and hamstring power  Neurological: She is alert and oriented to person, place, and time.  Skin: Skin is warm and dry.  Psychiatric: She has a normal mood and affect. Her behavior is normal. Judgment and thought content normal.  Vital signs in last 24 hours:    Labs:   Estimated body mass index is 45.40 kg/(m^2) as calculated from the following:   Height as of 09/25/13: 5\' 8"  (1.727 m).   Weight as of 09/18/13: 135.399 kg (298 lb 8 oz).   Imaging Review Plain radiographs demonstrate AP, Rosenberg, lateral and sunrise x-rays show end-stage arthritis of the patellofemoral joint of the right knee and near bone-on-bone arthritis of the medial compartment with  2-3 mm of lateral subluxation of the tibia beneath the femur.  The contralateral left total knee has an excellent appearance with good fixation between the bone metal and cement.  Assessment/Plan:  End stage arthritis, right knee   The patient history, physical examination, clinical judgment of the provider and imaging studies are consistent with end stage degenerative joint disease of the right knee(s) and total knee arthroplasty is deemed medically necessary. The treatment options including medical management, injection therapy arthroscopy and arthroplasty were discussed at length. The risks and benefits of total knee arthroplasty were presented and reviewed. The risks due to aseptic loosening, infection, stiffness, patella tracking problems, thromboembolic complications and other imponderables were discussed. The patient acknowledged the explanation, agreed to proceed with the plan and consent was signed. Patient is being admitted for inpatient treatment for surgery, pain control, PT, OT, prophylactic antibiotics, VTE prophylaxis, progressive ambulation and ADL's and discharge planning. The patient is planning to be discharged home with home health services

## 2014-02-07 NOTE — Progress Notes (Signed)
Called patient and informed her of surgical time change with stated understanding to arrive at 0830 and keep all other instructions the same.

## 2014-02-09 DIAGNOSIS — M1711 Unilateral primary osteoarthritis, right knee: Secondary | ICD-10-CM | POA: Diagnosis present

## 2014-02-09 HISTORY — DX: Unilateral primary osteoarthritis, right knee: M17.11

## 2014-02-09 MED ORDER — DEXTROSE 5 % IV SOLN
3.0000 g | INTRAVENOUS | Status: DC
  Filled 2014-02-09: qty 3000

## 2014-02-09 MED ORDER — CHLORHEXIDINE GLUCONATE 4 % EX LIQD
60.0000 mL | Freq: Once | CUTANEOUS | Status: DC
  Filled 2014-02-09: qty 60

## 2014-02-09 NOTE — Anesthesia Preprocedure Evaluation (Addendum)
Anesthesia Evaluation  Patient identified by MRN, date of birth, ID band Patient awake    Reviewed: Allergy & Precautions, NPO status , Patient's Chart, lab work & pertinent test results, reviewed documented beta blocker date and time   History of Anesthesia Complications (+) PONV  Airway Mallampati: II   Neck ROM: Full    Dental  (+) Dental Advisory Given,    Pulmonary neg pulmonary ROS,  breath sounds clear to auscultation        Cardiovascular hypertension, Rhythm:Regular  EKG 08/2013 WNL   Neuro/Psych Anxiety Depression    GI/Hepatic GERD-  Medicated,  Endo/Other    Renal/GU      Musculoskeletal   Abdominal (+) + obese,   Peds  Hematology 15/46 H/H   Anesthesia Other Findings   Reproductive/Obstetrics                           Anesthesia Physical Anesthesia Plan  ASA: II  Anesthesia Plan: General   Post-op Pain Management: MAC Combined w/ Regional for Post-op pain   Induction: Intravenous  Airway Management Planned: Oral ETT  Additional Equipment:   Intra-op Plan:   Post-operative Plan: Extubation in OR  Informed Consent: I have reviewed the patients History and Physical, chart, labs and discussed the procedure including the risks, benefits and alternatives for the proposed anesthesia with the patient or authorized representative who has indicated his/her understanding and acceptance.     Plan Discussed with:   Anesthesia Plan Comments:         Anesthesia Quick Evaluation

## 2014-02-10 ENCOUNTER — Inpatient Hospital Stay (HOSPITAL_COMMUNITY): Payer: Medicare Other | Admitting: Anesthesiology

## 2014-02-10 ENCOUNTER — Inpatient Hospital Stay (HOSPITAL_COMMUNITY)
Admission: RE | Admit: 2014-02-10 | Discharge: 2014-02-12 | DRG: 470 | Disposition: A | Discharge status: Skilled Nursing Facility | Payer: Medicare Other | Source: Ambulatory Visit | Attending: Orthopedic Surgery | Admitting: Orthopedic Surgery

## 2014-02-10 ENCOUNTER — Encounter (HOSPITAL_COMMUNITY)
Admission: RE | Disposition: A | Discharge status: Skilled Nursing Facility | Payer: Self-pay | Source: Ambulatory Visit | Attending: Orthopedic Surgery

## 2014-02-10 ENCOUNTER — Encounter (HOSPITAL_COMMUNITY): Payer: Self-pay | Admitting: *Deleted

## 2014-02-10 DIAGNOSIS — K219 Gastro-esophageal reflux disease without esophagitis: Secondary | ICD-10-CM | POA: Diagnosis present

## 2014-02-10 DIAGNOSIS — Z9884 Bariatric surgery status: Secondary | ICD-10-CM | POA: Diagnosis not present

## 2014-02-10 DIAGNOSIS — Z6841 Body Mass Index (BMI) 40.0 and over, adult: Secondary | ICD-10-CM | POA: Diagnosis not present

## 2014-02-10 DIAGNOSIS — I10 Essential (primary) hypertension: Secondary | ICD-10-CM | POA: Diagnosis present

## 2014-02-10 DIAGNOSIS — D62 Acute posthemorrhagic anemia: Secondary | ICD-10-CM | POA: Diagnosis not present

## 2014-02-10 DIAGNOSIS — Z96652 Presence of left artificial knee joint: Secondary | ICD-10-CM | POA: Diagnosis present

## 2014-02-10 DIAGNOSIS — Z79899 Other long term (current) drug therapy: Secondary | ICD-10-CM | POA: Diagnosis not present

## 2014-02-10 DIAGNOSIS — M1711 Unilateral primary osteoarthritis, right knee: Principal | ICD-10-CM | POA: Diagnosis present

## 2014-02-10 DIAGNOSIS — M25561 Pain in right knee: Secondary | ICD-10-CM | POA: Diagnosis present

## 2014-02-10 DIAGNOSIS — Z7982 Long term (current) use of aspirin: Secondary | ICD-10-CM | POA: Diagnosis not present

## 2014-02-10 DIAGNOSIS — F329 Major depressive disorder, single episode, unspecified: Secondary | ICD-10-CM | POA: Diagnosis present

## 2014-02-10 HISTORY — DX: Unilateral primary osteoarthritis, right knee: M17.11

## 2014-02-10 HISTORY — PX: TOTAL KNEE ARTHROPLASTY: SHX125

## 2014-02-10 SURGERY — ARTHROPLASTY, KNEE, TOTAL
Anesthesia: General | Laterality: Right

## 2014-02-10 MED ORDER — FENTANYL CITRATE 0.05 MG/ML IJ SOLN
50.0000 ug | INTRAMUSCULAR | Status: DC | PRN
  Administered 2014-02-10: 100 ug via INTRAVENOUS

## 2014-02-10 MED ORDER — ALPRAZOLAM 0.25 MG PO TABS
0.2500 mg | ORAL_TABLET | Freq: Every day | ORAL | Status: DC
  Administered 2014-02-10 – 2014-02-11 (×2): 0.25 mg via ORAL
  Filled 2014-02-10 (×2): qty 1

## 2014-02-10 MED ORDER — MIDAZOLAM HCL 5 MG/5ML IJ SOLN
INTRAMUSCULAR | Status: DC | PRN
  Administered 2014-02-10: 2 mg via INTRAVENOUS

## 2014-02-10 MED ORDER — DEXTROSE-NACL 5-0.45 % IV SOLN: INTRAVENOUS | Status: DC

## 2014-02-10 MED ORDER — PROMETHAZINE HCL 25 MG/ML IJ SOLN: 6.2500 mg | INTRAMUSCULAR | Status: DC | PRN

## 2014-02-10 MED ORDER — ACETAMINOPHEN 650 MG RE SUPP: 650.0000 mg | Freq: Four times a day (QID) | RECTAL | Status: DC | PRN

## 2014-02-10 MED ORDER — BUPIVACAINE LIPOSOME 1.3 % IJ SUSP
INTRAMUSCULAR | Status: DC | PRN
  Administered 2014-02-10: 20 mL

## 2014-02-10 MED ORDER — METHOCARBAMOL 1000 MG/10ML IJ SOLN
500.0000 mg | INTRAVENOUS | Status: AC
  Administered 2014-02-10: 500 mg via INTRAVENOUS
  Filled 2014-02-10: qty 5

## 2014-02-10 MED ORDER — METHOCARBAMOL 500 MG PO TABS
500.0000 mg | ORAL_TABLET | Freq: Four times a day (QID) | ORAL | Status: DC | PRN
  Administered 2014-02-10 – 2014-02-12 (×5): 500 mg via ORAL
  Filled 2014-02-10 (×5): qty 1

## 2014-02-10 MED ORDER — ONDANSETRON HCL 4 MG/2ML IJ SOLN: 4.0000 mg | Freq: Four times a day (QID) | INTRAMUSCULAR | Status: DC | PRN

## 2014-02-10 MED ORDER — FENTANYL CITRATE 0.05 MG/ML IJ SOLN
INTRAMUSCULAR | Status: AC
  Filled 2014-02-10: qty 5

## 2014-02-10 MED ORDER — FENTANYL CITRATE 0.05 MG/ML IJ SOLN
INTRAMUSCULAR | Status: AC
  Filled 2014-02-10: qty 2

## 2014-02-10 MED ORDER — LACTATED RINGERS IV SOLN
INTRAVENOUS | Status: DC
  Administered 2014-02-10: 10:00:00 via INTRAVENOUS

## 2014-02-10 MED ORDER — METOCLOPRAMIDE HCL 10 MG PO TABS: 5.0000 mg | ORAL_TABLET | Freq: Three times a day (TID) | ORAL | Status: DC | PRN

## 2014-02-10 MED ORDER — DIPHENHYDRAMINE HCL 12.5 MG/5ML PO ELIX: 12.5000 mg | ORAL_SOLUTION | ORAL | Status: DC | PRN

## 2014-02-10 MED ORDER — BUPIVACAINE-EPINEPHRINE (PF) 0.5% -1:200000 IJ SOLN
INTRAMUSCULAR | Status: DC | PRN
  Administered 2014-02-10: 30 mL via PERINEURAL

## 2014-02-10 MED ORDER — LIDOCAINE HCL (CARDIAC) 20 MG/ML IV SOLN
INTRAVENOUS | Status: DC | PRN
  Administered 2014-02-10: 100 mg via INTRAVENOUS

## 2014-02-10 MED ORDER — PAROXETINE HCL 20 MG PO TABS
40.0000 mg | ORAL_TABLET | ORAL | Status: DC
  Administered 2014-02-11 – 2014-02-12 (×2): 40 mg via ORAL
  Filled 2014-02-10 (×3): qty 2

## 2014-02-10 MED ORDER — TRANEXAMIC ACID 100 MG/ML IV SOLN
1000.0000 mg | INTRAVENOUS | Status: AC
  Administered 2014-02-10: 1000 mg via INTRAVENOUS
  Filled 2014-02-10: qty 10

## 2014-02-10 MED ORDER — HYDROMORPHONE HCL 1 MG/ML IJ SOLN
1.0000 mg | INTRAMUSCULAR | Status: DC | PRN
  Administered 2014-02-11 – 2014-02-12 (×6): 1 mg via INTRAVENOUS
  Filled 2014-02-10 (×6): qty 1

## 2014-02-10 MED ORDER — BISACODYL 5 MG PO TBEC: 5.0000 mg | DELAYED_RELEASE_TABLET | Freq: Every day | ORAL | Status: DC | PRN

## 2014-02-10 MED ORDER — MIDAZOLAM HCL 2 MG/2ML IJ SOLN
INTRAMUSCULAR | Status: AC
  Filled 2014-02-10: qty 2

## 2014-02-10 MED ORDER — SENNOSIDES-DOCUSATE SODIUM 8.6-50 MG PO TABS: 1.0000 | ORAL_TABLET | Freq: Every evening | ORAL | Status: DC | PRN

## 2014-02-10 MED ORDER — CEFAZOLIN SODIUM-DEXTROSE 2-3 GM-% IV SOLR
INTRAVENOUS | Status: DC | PRN
  Administered 2014-02-10: 2 g via INTRAVENOUS

## 2014-02-10 MED ORDER — MENTHOL 3 MG MT LOZG: 1.0000 | LOZENGE | OROMUCOSAL | Status: DC | PRN

## 2014-02-10 MED ORDER — ASPIRIN EC 325 MG PO TBEC: 325.0000 mg | DELAYED_RELEASE_TABLET | Freq: Two times a day (BID) | ORAL | Status: DC

## 2014-02-10 MED ORDER — NEOSTIGMINE METHYLSULFATE 10 MG/10ML IV SOLN
INTRAVENOUS | Status: DC | PRN
  Administered 2014-02-10: 4 mg via INTRAVENOUS

## 2014-02-10 MED ORDER — OXYBUTYNIN CHLORIDE 5 MG PO TABS
5.0000 mg | ORAL_TABLET | Freq: Two times a day (BID) | ORAL | Status: DC
  Administered 2014-02-10 – 2014-02-12 (×5): 5 mg via ORAL
  Filled 2014-02-10 (×6): qty 1

## 2014-02-10 MED ORDER — ROCURONIUM BROMIDE 100 MG/10ML IV SOLN
INTRAVENOUS | Status: DC | PRN
Start: 2014-02-10 — End: 2014-02-10
  Administered 2014-02-10: 40 mg via INTRAVENOUS

## 2014-02-10 MED ORDER — METOCLOPRAMIDE HCL 5 MG/ML IJ SOLN: 5.0000 mg | Freq: Three times a day (TID) | INTRAMUSCULAR | Status: DC | PRN

## 2014-02-10 MED ORDER — GLYCOPYRROLATE 0.2 MG/ML IJ SOLN
INTRAMUSCULAR | Status: AC
  Filled 2014-02-10: qty 3

## 2014-02-10 MED ORDER — ONDANSETRON HCL 4 MG PO TABS: 4.0000 mg | ORAL_TABLET | Freq: Four times a day (QID) | ORAL | Status: DC | PRN

## 2014-02-10 MED ORDER — BUPIVACAINE LIPOSOME 1.3 % IJ SUSP
20.0000 mL | Freq: Once | INTRAMUSCULAR | Status: DC
  Filled 2014-02-10: qty 20

## 2014-02-10 MED ORDER — KCL IN DEXTROSE-NACL 20-5-0.45 MEQ/L-%-% IV SOLN
INTRAVENOUS | Status: DC
  Administered 2014-02-10 (×2): via INTRAVENOUS
  Filled 2014-02-10 (×7): qty 1000

## 2014-02-10 MED ORDER — PROPOFOL 10 MG/ML IV BOLUS
INTRAVENOUS | Status: DC | PRN
  Administered 2014-02-10: 150 mg via INTRAVENOUS

## 2014-02-10 MED ORDER — OXYCODONE HCL 5 MG PO TABS
5.0000 mg | ORAL_TABLET | ORAL | Status: DC | PRN
  Administered 2014-02-10 – 2014-02-12 (×10): 10 mg via ORAL
  Filled 2014-02-10 (×10): qty 2

## 2014-02-10 MED ORDER — CEFUROXIME SODIUM 1.5 G IJ SOLR
INTRAMUSCULAR | Status: AC
  Filled 2014-02-10: qty 1.5

## 2014-02-10 MED ORDER — ACETAMINOPHEN 10 MG/ML IV SOLN
INTRAVENOUS | Status: AC
  Filled 2014-02-10: qty 100

## 2014-02-10 MED ORDER — DOCUSATE SODIUM 100 MG PO CAPS
100.0000 mg | ORAL_CAPSULE | Freq: Two times a day (BID) | ORAL | Status: DC
  Administered 2014-02-10 – 2014-02-12 (×4): 100 mg via ORAL
  Filled 2014-02-10 (×5): qty 1

## 2014-02-10 MED ORDER — ACETAMINOPHEN 10 MG/ML IV SOLN
INTRAVENOUS | Status: DC | PRN
  Administered 2014-02-10: 1000 mg via INTRAVENOUS

## 2014-02-10 MED ORDER — ASPIRIN EC 325 MG PO TBEC
325.0000 mg | DELAYED_RELEASE_TABLET | Freq: Every day | ORAL | Status: DC
  Administered 2014-02-11 – 2014-02-12 (×2): 325 mg via ORAL
  Filled 2014-02-10 (×3): qty 1

## 2014-02-10 MED ORDER — SODIUM CHLORIDE 0.9 % IR SOLN
Status: DC | PRN
  Administered 2014-02-10: 3000 mL

## 2014-02-10 MED ORDER — SODIUM CHLORIDE 0.9 % IJ SOLN
INTRAMUSCULAR | Status: DC | PRN
  Administered 2014-02-10: 20 mL
  Administered 2014-02-10 (×2): 10 mL

## 2014-02-10 MED ORDER — FLEET ENEMA 7-19 GM/118ML RE ENEM
1.0000 | ENEMA | Freq: Once | RECTAL | Status: AC | PRN
Start: 2014-02-10 — End: 2014-02-10

## 2014-02-10 MED ORDER — ROCURONIUM BROMIDE 50 MG/5ML IV SOLN
INTRAVENOUS | Status: AC
  Filled 2014-02-10: qty 1

## 2014-02-10 MED ORDER — NEOSTIGMINE METHYLSULFATE 10 MG/10ML IV SOLN
INTRAVENOUS | Status: AC
  Filled 2014-02-10: qty 1

## 2014-02-10 MED ORDER — MIDAZOLAM HCL 2 MG/2ML IJ SOLN
1.0000 mg | INTRAMUSCULAR | Status: DC | PRN
  Administered 2014-02-10: 1 mg via INTRAVENOUS

## 2014-02-10 MED ORDER — OXYCODONE HCL 5 MG PO TABS
ORAL_TABLET | ORAL | Status: AC
  Filled 2014-02-10: qty 2

## 2014-02-10 MED ORDER — GLYCOPYRROLATE 0.2 MG/ML IJ SOLN
INTRAMUSCULAR | Status: DC | PRN
  Administered 2014-02-10: 0.6 mg via INTRAVENOUS

## 2014-02-10 MED ORDER — ACETAMINOPHEN 325 MG PO TABS
650.0000 mg | ORAL_TABLET | Freq: Four times a day (QID) | ORAL | Status: DC | PRN
  Administered 2014-02-11: 650 mg via ORAL
  Filled 2014-02-10: qty 2

## 2014-02-10 MED ORDER — ONDANSETRON HCL 4 MG/2ML IJ SOLN
INTRAMUSCULAR | Status: AC
  Filled 2014-02-10: qty 2

## 2014-02-10 MED ORDER — OXYCODONE-ACETAMINOPHEN 5-325 MG PO TABS: 1.0000 | ORAL_TABLET | ORAL | Status: DC | PRN

## 2014-02-10 MED ORDER — METHOCARBAMOL 1000 MG/10ML IJ SOLN: 500.0000 mg | Freq: Four times a day (QID) | INTRAVENOUS | Status: DC | PRN

## 2014-02-10 MED ORDER — DEXAMETHASONE SODIUM PHOSPHATE 10 MG/ML IJ SOLN
INTRAMUSCULAR | Status: DC | PRN
  Administered 2014-02-10: 10 mg via INTRAVENOUS

## 2014-02-10 MED ORDER — SCOPOLAMINE 1 MG/3DAYS TD PT72
1.0000 | MEDICATED_PATCH | TRANSDERMAL | Status: DC
  Administered 2014-02-10: 1.5 mg via TRANSDERMAL
  Filled 2014-02-10: qty 1

## 2014-02-10 MED ORDER — ALUM & MAG HYDROXIDE-SIMETH 200-200-20 MG/5ML PO SUSP: 30.0000 mL | ORAL | Status: DC | PRN

## 2014-02-10 MED ORDER — PROPOFOL 10 MG/ML IV BOLUS
INTRAVENOUS | Status: AC
  Filled 2014-02-10: qty 20

## 2014-02-10 MED ORDER — LIDOCAINE HCL (CARDIAC) 20 MG/ML IV SOLN
INTRAVENOUS | Status: AC
  Filled 2014-02-10: qty 5

## 2014-02-10 MED ORDER — ONDANSETRON HCL 4 MG/2ML IJ SOLN
INTRAMUSCULAR | Status: DC | PRN
  Administered 2014-02-10: 4 mg via INTRAVENOUS

## 2014-02-10 MED ORDER — FENTANYL CITRATE 0.05 MG/ML IJ SOLN
INTRAMUSCULAR | Status: AC
  Administered 2014-02-10: 100 ug via INTRAVENOUS
  Filled 2014-02-10: qty 4

## 2014-02-10 MED ORDER — PANTOPRAZOLE SODIUM 40 MG PO TBEC
40.0000 mg | DELAYED_RELEASE_TABLET | Freq: Every day | ORAL | Status: DC
  Administered 2014-02-10 – 2014-02-11 (×2): 40 mg via ORAL
  Filled 2014-02-10 (×2): qty 1

## 2014-02-10 MED ORDER — FENTANYL CITRATE 0.05 MG/ML IJ SOLN
INTRAMUSCULAR | Status: DC | PRN
  Administered 2014-02-10 (×2): 50 ug via INTRAVENOUS
  Administered 2014-02-10: 100 ug via INTRAVENOUS
  Administered 2014-02-10 (×2): 25 ug via INTRAVENOUS
  Administered 2014-02-10 (×2): 50 ug via INTRAVENOUS

## 2014-02-10 MED ORDER — PRAMIPEXOLE DIHYDROCHLORIDE 0.25 MG PO TABS
0.2500 mg | ORAL_TABLET | Freq: Two times a day (BID) | ORAL | Status: DC | PRN
  Administered 2014-02-10: 0.25 mg via ORAL
  Filled 2014-02-10 (×4): qty 1

## 2014-02-10 MED ORDER — LACTATED RINGERS IV SOLN
INTRAVENOUS | Status: DC | PRN
  Administered 2014-02-10: 11:00:00 via INTRAVENOUS

## 2014-02-10 MED ORDER — KCL IN DEXTROSE-NACL 20-5-0.45 MEQ/L-%-% IV SOLN
INTRAVENOUS | Status: AC
  Filled 2014-02-10: qty 1000

## 2014-02-10 MED ORDER — PHENOL 1.4 % MT LIQD: 1.0000 | OROMUCOSAL | Status: DC | PRN

## 2014-02-10 MED ORDER — METHOCARBAMOL 500 MG PO TABS: 500.0000 mg | ORAL_TABLET | Freq: Two times a day (BID) | ORAL | Status: DC

## 2014-02-10 MED ORDER — MEPERIDINE HCL 25 MG/ML IJ SOLN: 6.2500 mg | INTRAMUSCULAR | Status: DC | PRN

## 2014-02-10 MED ORDER — FENTANYL CITRATE 0.05 MG/ML IJ SOLN
25.0000 ug | INTRAMUSCULAR | Status: AC | PRN
  Administered 2014-02-10: 25 ug via INTRAVENOUS
  Administered 2014-02-10: 50 ug via INTRAVENOUS
  Administered 2014-02-10 (×3): 25 ug via INTRAVENOUS
  Administered 2014-02-10: 50 ug via INTRAVENOUS

## 2014-02-10 MED ORDER — EPHEDRINE SULFATE 50 MG/ML IJ SOLN
INTRAMUSCULAR | Status: DC | PRN
  Administered 2014-02-10: 10 mg via INTRAVENOUS

## 2014-02-10 MED ORDER — MIDAZOLAM HCL 2 MG/2ML IJ SOLN
INTRAMUSCULAR | Status: AC
  Administered 2014-02-10: 1 mg via INTRAVENOUS
  Filled 2014-02-10: qty 4

## 2014-02-10 SURGICAL SUPPLY — 67 items
BANDAGE ESMARK 6X9 LF (GAUZE/BANDAGES/DRESSINGS) ×1 IMPLANT
BLADE SAG 18X100X1.27 (BLADE) ×2 IMPLANT
BLADE SAW SGTL 13X75X1.27 (BLADE) ×2 IMPLANT
BLADE SURG ROTATE 9660 (MISCELLANEOUS) IMPLANT
BNDG CMPR 9X6 STRL LF SNTH (GAUZE/BANDAGES/DRESSINGS) ×1
BNDG CMPR MED 10X6 ELC LF (GAUZE/BANDAGES/DRESSINGS) ×1
BNDG ELASTIC 6X10 VLCR STRL LF (GAUZE/BANDAGES/DRESSINGS) ×2 IMPLANT
BNDG ESMARK 6X9 LF (GAUZE/BANDAGES/DRESSINGS) ×2
BOWL SMART MIX CTS (DISPOSABLE) ×2 IMPLANT
CAP KNEE TOTAL 3 SIGMA ×1 IMPLANT
CEMENT HV SMART SET (Cement) ×4 IMPLANT
COVER SURGICAL LIGHT HANDLE (MISCELLANEOUS) ×2 IMPLANT
CUFF TOURNIQUET SINGLE 34IN LL (TOURNIQUET CUFF) ×1 IMPLANT
CUFF TOURNIQUET SINGLE 44IN (TOURNIQUET CUFF) IMPLANT
DRAPE EXTREMITY T 121X128X90 (DRAPE) ×2 IMPLANT
DRAPE IMP U-DRAPE 54X76 (DRAPES) ×2 IMPLANT
DRAPE U-SHAPE 47X51 STRL (DRAPES) ×2 IMPLANT
DURAPREP 26ML APPLICATOR (WOUND CARE) ×4 IMPLANT
ELECT REM PT RETURN 9FT ADLT (ELECTROSURGICAL) ×2
ELECTRODE REM PT RTRN 9FT ADLT (ELECTROSURGICAL) ×1 IMPLANT
EVACUATOR 1/8 PVC DRAIN (DRAIN) ×2 IMPLANT
GAUZE SPONGE 4X4 12PLY STRL (GAUZE/BANDAGES/DRESSINGS) ×3 IMPLANT
GAUZE XEROFORM 1X8 LF (GAUZE/BANDAGES/DRESSINGS) ×2 IMPLANT
GLOVE BIO SURGEON STRL SZ7.5 (GLOVE) ×2 IMPLANT
GLOVE BIO SURGEON STRL SZ8.5 (GLOVE) ×2 IMPLANT
GLOVE BIOGEL PI IND STRL 6.5 (GLOVE) IMPLANT
GLOVE BIOGEL PI IND STRL 8 (GLOVE) ×1 IMPLANT
GLOVE BIOGEL PI IND STRL 9 (GLOVE) ×1 IMPLANT
GLOVE BIOGEL PI INDICATOR 6.5 (GLOVE) ×1
GLOVE BIOGEL PI INDICATOR 8 (GLOVE) ×1
GLOVE BIOGEL PI INDICATOR 9 (GLOVE) ×1
GLOVE ECLIPSE 6.5 STRL STRAW (GLOVE) ×1 IMPLANT
GOWN STRL REUS W/ TWL LRG LVL3 (GOWN DISPOSABLE) ×1 IMPLANT
GOWN STRL REUS W/ TWL XL LVL3 (GOWN DISPOSABLE) ×2 IMPLANT
GOWN STRL REUS W/TWL LRG LVL3 (GOWN DISPOSABLE) ×2
GOWN STRL REUS W/TWL XL LVL3 (GOWN DISPOSABLE) ×4
HANDPIECE INTERPULSE COAX TIP (DISPOSABLE) ×2
HOOD PEEL AWAY FACE SHEILD DIS (HOOD) ×4 IMPLANT
KIT BASIN OR (CUSTOM PROCEDURE TRAY) ×2 IMPLANT
KIT ROOM TURNOVER OR (KITS) ×2 IMPLANT
MANIFOLD NEPTUNE II (INSTRUMENTS) ×2 IMPLANT
NDL SAFETY ECLIPSE 18X1.5 (NEEDLE) IMPLANT
NDL SPNL 18GX3.5 QUINCKE PK (NEEDLE) IMPLANT
NEEDLE 22X1 1/2 (OR ONLY) (NEEDLE) ×1 IMPLANT
NEEDLE HYPO 18GX1.5 SHARP (NEEDLE)
NEEDLE SPNL 18GX3.5 QUINCKE PK (NEEDLE) ×2 IMPLANT
NS IRRIG 1000ML POUR BTL (IV SOLUTION) ×2 IMPLANT
PACK TOTAL JOINT (CUSTOM PROCEDURE TRAY) ×2 IMPLANT
PACK UNIVERSAL I (CUSTOM PROCEDURE TRAY) ×2 IMPLANT
PAD ARMBOARD 7.5X6 YLW CONV (MISCELLANEOUS) ×4 IMPLANT
PADDING CAST COTTON 6X4 STRL (CAST SUPPLIES) ×2 IMPLANT
SET HNDPC FAN SPRY TIP SCT (DISPOSABLE) ×1 IMPLANT
STAPLER VISISTAT 35W (STAPLE) ×2 IMPLANT
STEM UNIVERSAL 115X12MM (Stem) ×1 IMPLANT
SUCTION FRAZIER TIP 10 FR DISP (SUCTIONS) ×2 IMPLANT
SUT VIC AB 0 CTX 36 (SUTURE) ×2
SUT VIC AB 0 CTX36XBRD ANTBCTR (SUTURE) ×1 IMPLANT
SUT VIC AB 1 CTX 36 (SUTURE) ×2
SUT VIC AB 1 CTX36XBRD ANBCTR (SUTURE) ×1 IMPLANT
SUT VIC AB 2-0 CT1 27 (SUTURE) ×2
SUT VIC AB 2-0 CT1 TAPERPNT 27 (SUTURE) ×1 IMPLANT
SYR 30ML LL (SYRINGE) ×1 IMPLANT
SYR 50ML LL SCALE MARK (SYRINGE) ×2 IMPLANT
TOWEL OR 17X24 6PK STRL BLUE (TOWEL DISPOSABLE) ×2 IMPLANT
TOWEL OR 17X26 10 PK STRL BLUE (TOWEL DISPOSABLE) ×2 IMPLANT
UPCHARGE REV TRAY MBT KNEE ×1 IMPLANT
WATER STERILE IRR 1000ML POUR (IV SOLUTION) ×2 IMPLANT

## 2014-02-10 NOTE — Anesthesia Postprocedure Evaluation (Signed)
Anesthesia Post Note  Patient: Paige [name]  Procedure(s) Performed: Procedure(s) (LRB): TOTAL KNEE ARTHROPLASTY (Right)  Anesthesia type: General  Patient location: PACU  Post pain: Pain level controlled  Post assessment: Post-op Vital signs reviewed  Last Vitals: BP 159/78 mmHg  Pulse 79  Temp(Src) 36.7 C  Resp 15  Ht 5\' 8"  (1.727 m)  Wt 299 lb (135.626 kg)  BMI 45.47 kg/m2  SpO2 97%  Post vital signs: Reviewed  Level of consciousness: sedated  Complications: No apparent anesthesia complications

## 2014-02-10 NOTE — Anesthesia Procedure Notes (Addendum)
Anesthesia Regional Block:  Adductor canal block  Pre-Anesthetic Checklist: ,, timeout performed, Correct Patient, Correct Site, Correct Laterality, Correct Procedure, Correct Position, site marked, Risks and benefits discussed, Surgical consent,  Pre-op evaluation,  At surgeon's request  Laterality: Lower  Prep: Maximum Sterile Barrier Precautions used and chloraprep       Needles:  Injection technique: Single-shot  Needle Type: Echogenic Stimulator Needle     Needle Length: 10cm 10 cm Needle Gauge: 21 and 21 G    Additional Needles:  Procedures: ultrasound guided (picture in chart) Adductor canal block Narrative:  Start time: 02/10/2014 10:24 AM End time: 02/10/2014 10:34 AM  Performed by: Personally  Anesthesiologist: Sebastian Ache  Additional Notes: R AC canal bllock with [place], .5% marcaine with epi, 44ml, multiple asp, talked to patient throughout, no commplications   Procedure Name: Intubation Date/Time: 02/10/2014 11:24 AM Performed by: Armandina Gemma Pre-anesthesia Checklist: Emergency Drugs available, Patient identified, Suction available, Patient being monitored and Timeout performed Patient Re-evaluated:Patient Re-evaluated prior to inductionOxygen Delivery Method: Circle system utilized Preoxygenation: Pre-oxygenation with 100% oxygen Intubation Type: IV induction Ventilation: Mask ventilation without difficulty Grade View: Grade I Tube type: Oral Tube size: 7.0 mm Number of attempts: 1 Airway Equipment and Method: Stylet and Video-laryngoscopy Placement Confirmation: ETT inserted through vocal cords under direct vision,  positive ETCO2 and breath sounds checked- equal and bilateral Secured at: 22 cm Tube secured with: Tape Dental Injury: Teeth and Oropharynx as per pre-operative assessment  Comments: IV induction Manny- glidescope intubation AM CRNA-for teaching- intubation atraumatic- teeth and mouth as preop- bilat BS

## 2014-02-10 NOTE — Transfer of Care (Signed)
Immediate Anesthesia Transfer of Care Note  Patient: Paige [name]  Procedure(s) Performed: Procedure(s): TOTAL KNEE ARTHROPLASTY (Right)  Patient Location: PACU  Anesthesia Type:General  Level of Consciousness: awake and alert   Airway & Oxygen Therapy: Patient Spontanous Breathing and Patient connected to nasal cannula oxygen  Post-op Assessment: Report given to PACU RN and Post -op Vital signs reviewed and stable  Post vital signs: Reviewed and stable  Complications: No apparent anesthesia complications

## 2014-02-10 NOTE — Progress Notes (Signed)
Orthopedic Tech Progress Note Patient Details:  Paige [name] 1945/08/15 [id number]  CPM Right Knee CPM Right Knee: On Right Knee Flexion (Degrees): 60 Right Knee Extension (Degrees): 0 Additional Comments: trapeze bar patient helper Viewed order from doctor's order list  Nikki Dom 02/10/2014, 2:28 PM

## 2014-02-10 NOTE — Interval H&P Note (Signed)
History and Physical Interval Note:  02/10/2014 10:07 AM  Paige [name]  has presented today for surgery, with the diagnosis of RIGHT KNEE OSTEOARTHRITIS  The various methods of treatment have been discussed with the patient and family. After consideration of risks, benefits and other options for treatment, the patient has consented to  Procedure(s): TOTAL KNEE ARTHROPLASTY (Right) as a surgical intervention .  The patient's history has been reviewed, patient examined, no change in status, stable for surgery.  I have reviewed the patient's chart and labs.  Questions were answered to the patient's satisfaction.     Nestor Lewandowsky

## 2014-02-10 NOTE — Op Note (Signed)
PATIENT ID:      Paige [name]  MRN:     [id number] DOB/AGE:    [birth year] / 69 y.o.       OPERATIVE REPORT    DATE OF PROCEDURE:  02/10/2014       PREOPERATIVE DIAGNOSIS:   RIGHT KNEE OSTEOARTHRITIS      Estimated body mass index is 45.47 kg/(m^2) as calculated from the following:   Height as of this encounter:  (1.727 m).   Weight as of this encounter: 135.626 kg (299 lb).                                                        POSTOPERATIVE DIAGNOSIS:   RIGHT KNEE OSTEOARTHRITIS                                                                      PROCEDURE:  Procedure(s): TOTAL KNEE ARTHROPLASTY Using Depuy Sigma RP implants #3R Femur, #3 MBT Tibia, 12x115 tib Stem 10mm Sigma RP bearing, 41 Patella     SURGEON: Deepti Gunawan J    ASSISTANT:   Eric K. [employer]   (Present and scrubbed throughout the case, critical for assistance with exposure, retraction, instrumentation, and closure.)         ANESTHESIA: GET, ACB, Exparel  DRAINS: 2 medium hemovac in knee   TOURNIQUET TIME:   COMPLICATIONS:  None     SPECIMENS: None   INDICATIONS FOR PROCEDURE: The patient has  RIGHT KNEE OSTEOARTHRITIS, varus deformities, XR shows bone on bone arthritis. Patient has failed all conservative measures including anti-inflammatory medicines, narcotics, attempts at  exercise and weight loss, cortisone injections and viscosupplementation.  Risks and benefits of surgery have been discussed, questions answered.   DESCRIPTION OF PROCEDURE: The patient identified by armband, received  IV antibiotics, in the holding area at [facility]. Patient taken to the operating room, appropriate anesthetic  monitors were attached, and general endotracheal anesthesia induced with  the patient in supine position, Foley catheter was inserted. Tourniquet  applied high to the operative thigh. Lateral post and foot positioner  applied to the table, the lower extremity was then prepped and draped  in  usual sterile fashion from the ankle to the tourniquet. Time-out procedure was performed. The limb was wrapped with an Esmarch bandage and the tourniquet inflated to 350 mmHg. We began the operation by making the anterior midline incision starting at handbreadth above the patella going over the patella 1 cm medial to and  4 cm distal to the tibial tubercle. Small bleeders in the skin and the  subcutaneous tissue identified and cauterized. Transverse retinaculum was incised and reflected medially and a medial parapatellar arthrotomy was accomplished. the patella was everted and theprepatellar fat pad resected. The superficial medial collateral  ligament was then elevated from anterior to posterior along the proximal  flare of the tibia and anterior half of the menisci resected. The knee was hyperflexed exposing bone on bone arthritis. Peripheral and notch osteophytes as well as the cruciate ligaments were then resected. We continued to  work our way around posteriorly along the proximal tibia, and externally  rotated the tibia subluxing it out from underneath the femur. A McHale  retractor was placed through the notch and a lateral Hohmann retractor  placed, and we then drilled through the proximal tibia in line with the  axis of the tibia followed by an intramedullary guide rod and 2-degree  posterior slope cutting guide. The tibial cutting guide was pinned into place  allowing resection of 4 mm of bone medially and about 8 mm of bone  laterally because of her varus deformity. Just like on the left side, herr proximal tibial bone was noted to be osteoporotic and quite flimsy. At this point, decision was made to use a 12 x 1 15 stem as we had on the left side. Satisfied with the tibial resection, we then  entered the distal femur 2 mm anterior to the PCL origin with the  intramedullary guide rod and applied the distal femoral cutting guide  set at 11mm, with 5 degrees of valgus. This was pinned along  the  epicondylar axis. At this point, the distal femoral cut was accomplished without difficulty. We then sized for a #3R femoral component and pinned the guide in 3 degrees of external rotation.The chamfer cutting guide was pinned into place. The anterior, posterior, and chamfer cuts were accomplished without difficulty followed by  the box cutting guide and the box cut. We also removed posterior osteophytes from the posterior femoral condyles. At this  time, the knee was brought into full extension. We checked our  extension and flexion gaps and found them symmetric at 10mm.  The patella thickness measured at 25 mm. We set the cutting guide at 15 and removed the posterior 10 mm  of the patella, sized for a 41 button and drilled the lollipop. The knee  was then once again hyperflexed exposing the proximal tibia. We reamed up to a 12 mm reamer for a 1:15 stem. We sized for a #3 MBT tibial base plate, pinned the trial plate in place, applied the smokestack and the conical reamer followed by the the Delta fin keel punch. We then applied the #3 MBT trial with the 12 x 1 15 stem. We then hammered into place the Sigma RP trial femoral component, inserted a 10-mm trial bearing, trial patellar button, and took the knee through range of motion from 0-130 degrees. No thumb pressure was required for patellar  tracking. At this point, all trial components were removed, a double batch of DePuy HV cement with 1500 mg of Zinacef was mixed and applied to all bony metallic mating surfaces except for the posterior condyles of the femur itself. In order, we  hammered into place the tibial tray, with a 12 mm x 115 mm stem, and removed excess cement, the femoral component and removed excess cement, a 10-mm Sigma RP bearing  was inserted, and the knee brought to full extension with compression.  The patellar button was clamped into place, and excess cement  removed. While the cement cured the wound was irrigated out with  normal saline solution pulse lavage, and medium Hemovac drains were placed from an anterolateral  approach. Ligament stability and patellar tracking were checked and found to be excellent. The parapatellar arthrotomy was closed with  running #1 Vicryl suture. The subcutaneous tissue with 0 and 2-0 undyed  Vicryl suture, and the skin with skin staples. A dressing of Xeroform,  4 x 4, dressing sponges, Webril, and Ace wrap applied.  The patient  awakened, extubated, and taken to recovery room without difficulty.   Nestor Lewandowsky 02/10/2014, 12:37 PM

## 2014-02-10 NOTE — Progress Notes (Signed)
Physical Therapy Evaluation Patient Details Name: Paige [name] MRN: [id number] DOB: December 21, 1945 Today's Date: 02/10/2014   History of Present Illness  Patient is a 69 yo female admitted 02/10/14 now s/p Rt TKA.  PMH:  OA, depression, HTN, RLS, BLE lymphedema, anemia, dizziness, Lt TKA 09/2013.  Clinical Impression  Patient presents with problems listed below.  Will benefit from acute PT to maximize functional independence prior to discharge.  Patient lives alone (husband resident at [facility]).  Recommend ST-SNF at discharge for continued therapy.    Follow Up Recommendations SNF;Supervision/Assistance - 24 hour    Equipment Recommendations  None recommended by PT    Recommendations for Other Services       Precautions / Restrictions Precautions Precautions: Knee;Fall Precaution Booklet Issued: Yes (comment) Precaution Comments: Reviewed precautions with patient. Restrictions Weight Bearing Restrictions: Yes RLE Weight Bearing: Weight bearing as tolerated      Mobility  Bed Mobility               General bed mobility comments: Patient in chair as PT entered room  Transfers Overall transfer level: Needs assistance Equipment used: Rolling walker (2 wheeled) Transfers: Sit to/from Stand Sit to Stand: Min assist         General transfer comment: Verbal cues for hand placement and technique.  Assist to rise to standing to/from recliner and BSC.  Assist for balance, and to control descent onto [facility] and chair.  Ambulation/Gait Ambulation/Gait assistance: Min assist Ambulation Distance (Feet): 20 Feet Assistive device: Rolling walker (2 wheeled) Gait Pattern/deviations: Step-to pattern;Decreased stance time - right;Decreased step length - left;Decreased stride length;Decreased weight shift to right;Antalgic;Trunk flexed Gait velocity: Decreased Gait velocity interpretation: Below normal speed for age/gender General Gait Details: Verbal cues for safe use of RW and  gait sequence.  Cues to stand upright - patient with flexed posture.  Assist for balance/safety.  Stairs            Wheelchair Mobility    Modified Rankin (Stroke Patients Only)       Balance Overall balance assessment: Needs assistance         Standing balance support: Bilateral upper extremity supported Standing balance-Leahy Scale: Poor                               Pertinent Vitals/Pain Pain Assessment: 0-10 Pain Score: 7  Pain Location: Rt knee and thigh Pain Descriptors / Indicators: Aching;Sore Pain Intervention(s): Limited activity within patient's tolerance;Premedicated before session;Repositioned    Home Living Family/patient expects to be discharged to:: Skilled nursing facility [facility]) Living Arrangements: Alone               Additional Comments: pt's husband new resident to [facility] as of July.      Prior Function Level of Independence: Independent         Comments: Reports she uses cane at times - rarely     Hand Dominance   Dominant Hand: Right    Extremity/Trunk Assessment   Upper Extremity Assessment: Overall WFL for tasks assessed           Lower Extremity Assessment: RLE deficits/detail;LLE deficits/detail RLE Deficits / Details: Decreased strength and ROM post-op.  Edema - lymphedema LLE Deficits / Details: Edema - lymphedema  Cervical / Trunk Assessment: Normal  Communication   Communication: No difficulties  Cognition Arousal/Alertness: Awake/alert Behavior During Therapy: WFL for tasks assessed/performed Overall Cognitive Status: Within  Functional Limits for tasks assessed                      General Comments      Exercises Total Joint Exercises Ankle Circles/Pumps: AROM;Both;10 reps;Seated      Assessment/Plan    PT Assessment Patient needs continued PT services  PT Diagnosis Difficulty walking;Abnormality of gait;Acute pain   PT Problem List Decreased  strength;Decreased range of motion;Decreased activity tolerance;Decreased balance;Decreased mobility;Decreased knowledge of use of DME;Decreased knowledge of precautions;Obesity;Pain  PT Treatment Interventions DME instruction;Gait training;Functional mobility training;Therapeutic activities;Therapeutic exercise;Patient/family education   PT Goals (Current goals can be found in the Care Plan section) Acute Rehab PT Goals Patient Stated Goal: To walk without pain PT Goal Formulation: With patient Time For Goal Achievement: 02/17/14 Potential to Achieve Goals: Good    Frequency 7X/week   Barriers to discharge Decreased caregiver support Patient lives alone    Co-evaluation               End of Session Equipment Utilized During Treatment: Gait belt;Oxygen Activity Tolerance: Patient limited by pain Patient left: in chair;with call bell/phone within reach Nurse Communication: Mobility status         Time: 0175-1025 PT Time Calculation (min) (ACUTE ONLY): 28 min   Charges:   PT Evaluation $Initial PT Evaluation Tier I: 1 Procedure PT Treatments $Gait Training: 23-37 mins   PT G Codes:        Vena Austria 02-20-2014, 7:37 PM Durenda Hurt. Renaldo Fiddler, [facility] Acute Rehab Services Pager [phone]

## 2014-02-11 ENCOUNTER — Encounter (HOSPITAL_COMMUNITY): Payer: Self-pay | Admitting: General Practice

## 2014-02-11 LAB — CBC
HEMATOCRIT: 38.7 % (ref 36.0–46.0)
HEMOGLOBIN: 13.2 g/dL (ref 12.0–15.0)
MCH: 32 pg (ref 26.0–34.0)
MCHC: 34.1 g/dL (ref 30.0–36.0)
MCV: 93.7 fL (ref 78.0–100.0)
Platelets: 186 10*3/uL (ref 150–400)
RBC: 4.13 MIL/uL (ref 3.87–5.11)
RDW: 14.2 % (ref 11.5–15.5)
WBC: 12.3 10*3/uL — ABNORMAL HIGH (ref 4.0–10.5)

## 2014-02-11 NOTE — Progress Notes (Signed)
Patient ID: Paige [name], female   DOB: 31-Aug-1945, 69 y.o.   MRN: [id number] PATIENT ID: Paige [name]  MRN: [id number]  DOB/AGE:  February 09, 1945 / 69 y.o.  1 Day Post-Op Procedure(s) (LRB): TOTAL KNEE ARTHROPLASTY (Right)    PROGRESS NOTE Subjective: Patient is alert, oriented, no Nausea, no Vomiting, yes passing gas, no Bowel Movement. Taking PO well. Denies SOB, Chest or Calf Pain. Using Incentive Spirometer, PAS in place. Ambulate WBAT, CPM 0-60 Patient reports pain as 3 on 0-10 scale  .    Objective: Vital signs in last 24 hours: Filed Vitals:   02/10/14 2334 02/11/14 0000 02/11/14 0400 02/11/14 0602  BP: 154/68   152/82  Pulse: 92   98  Temp: 98.8 F (37.1 C)   98.7 F (37.1 C)  Resp: 16 16 16 16   Height:      Weight:      SpO2: 95% 95% 95% 95%      Intake/Output from previous day: I/O last 3 completed shifts: In: 3819.2 [P.O.:240; I.V.:3379.2; Other:200] Out: 350 [Drains:300; Blood:50]   Intake/Output this shift:     LABORATORY DATA:  Recent Labs  02/11/14 0529  WBC 12.3*  HGB 13.2  HCT 38.7  PLT 186    Examination: Neurologically intact ABD soft Neurovascular intact Sensation intact distally Intact pulses distally Dorsiflexion/Plantar flexion intact Incision: dressing C/D/I No cellulitis present Compartment soft} Blood and plasma separated in drain indicating minimal recent drainage, drain pulled without difficulty.  Assessment:   1 Day Post-Op Procedure(s) (LRB): TOTAL KNEE ARTHROPLASTY (Right) ADDITIONAL DIAGNOSIS: Expected Acute Blood Loss Anemia, Morbid obesity  Plan: PT/OT WBAT, CPM 5/hrs day until ROM 0-90 degrees, then D/C CPM DVT Prophylaxis:  SCDx72hrs, ASA 325 mg BID x 2 weeks DISCHARGE PLAN: Skilled Nursing Facility/Rehab, [facility] in [place] DISCHARGE NEEDS: HHPT, HHRN, CPM, Walker and 3-in-1 comode seat     Ossie Yebra J 02/11/2014, 8:06 AM

## 2014-02-11 NOTE — Progress Notes (Signed)
OT Cancellation Note  Patient Details Name: Paige [name] MRN: [id number] DOB: 1946/01/22   Cancelled Treatment:    Reason Eval/Treat Not Completed: Other (comment) Pt is Medicare and current D/C plan is SNF. No apparent immediate acute care OT needs, therefore will defer OT to SNF. If OT eval is needed please call Acute Rehab Dept. at [phone] or text page OT at [phone].  [facility] Shalini Mair, OTR/L  [phone] 02/11/2014 02/11/2014, 1:26 PM

## 2014-02-11 NOTE — Progress Notes (Signed)
Utilization review completed.  

## 2014-02-11 NOTE — Progress Notes (Signed)
Pt had a temp of 103F at 19:03. Pt given 650 mg of tylenol at 22:17. Incentive Spirometer encouraged. Rechecked temp at 22:59 and it was 100F. Will continue to monitor pt.

## 2014-02-11 NOTE — Clinical Social Work Psychosocial (Signed)
Clinical Social Work Department BRIEF PSYCHOSOCIAL ASSESSMENT 02/11/2014  Patient:  Paige [name], Paige [name]     Account Number:  [id number]     Admit date:  02/10/2014  Clinical Social Worker:  Read Drivers  Date/Time:  02/11/2014 11:23 AM  Referred by:  Physician  Date Referred:  02/11/2014 Referred for  SNF Placement  Psychosocial assessment   Other Referral:   none   Interview type:  Patient Other interview type:   none    PSYCHOSOCIAL DATA Living Status:  HUSBAND Admitted from facility:   Level of care:   Primary support name:  john Primary support relationship to patient:  SPOUSE Degree of support available:   adequate    CURRENT CONCERNS Current Concerns  Post-Acute Placement   Other Concerns:   none    SOCIAL WORK ASSESSMENT / PLAN CSW assessed patient at bedside.  Patient is alert and oriented x4.  Patient reports being pre-registered with [facility].  PT is recommending SNF/STR at time of dc.  Patient is aware and declines a SNF search of [facility] as a backup to first [facility]. Patient reports being from home with husband.  Patient reports husband having been to [facility] in the past for STR and the experience was pleasant.   Assessment/plan status:  Psychosocial Support/Ongoing Assessment of Needs Other assessment/ plan:   FL2  PASARR   Information/referral to community resources:   SNF/STR    PATIENT'S/FAMILY'S RESPONSE TO PLAN OF CARE: Patient is agreeable to being dc to [facility]. Patient declines SNF search of [facility] as a backup option to first choice.       Vickii Penna, LCSWA [phone]  Psychiatric & Orthopedics (5N 1-16) Clinical Social Worker

## 2014-02-11 NOTE — Plan of Care (Signed)
Problem: Consults Goal: Diagnosis- Total Joint Replacement Outcome: Completed/Met Date Met:  02/11/14 Primary Total Knee right

## 2014-02-11 NOTE — Clinical Social Work Placement (Addendum)
Clinical Social Work Department CLINICAL SOCIAL WORK PLACEMENT NOTE 02/11/2014  Patient:  Paige [name], Paige [name]  Account Number:  [id number] Admit date:  02/10/2014  Clinical Social Worker:  Read Drivers  Date/time:  02/11/2014 11:29 AM  Clinical Social Work is seeking post-discharge placement for this patient at the following level of care:   SKILLED NURSING   (*CSW will update this form in Epic as items are completed)   02/11/2014  Patient/family provided with [facility] Department of Clinical Social Work's list of facilities offering this level of care within the geographic area requested by the patient (or if unable, by the patient's family).  02/11/2014  Patient/family informed of their freedom to choose among providers that offer the needed level of care, that participate in Medicare, Medicaid or managed care program needed by the patient, have an available bed and are willing to accept the patient.  02/11/2014  Patient/family informed of [facility]' ownership interest in [facility], as well as of the fact that they are under no obligation to receive care at this facility.  PASARR submitted to EDS on 02/11/2014 PASARR number received on 02/11/2014  FL2 transmitted to all facilities in geographic area requested by pt/family on  02/11/2014 FL2 transmitted to all facilities within larger geographic area on   Patient informed that his/her managed care company has contracts with or will negotiate with  certain facilities, including the following:     Patient/family informed of bed offers received:  02/11/2014 Patient chooses bed at [facility] Physician recommends and patient chooses bed at    Patient to be transferred to [facility] on  02/12/2014 Patient to be transferred to facility by [facility] Patient and family notified of transfer on 02/11/2014 Name of family member notified:  Patient  The following physician request were entered in  Epic:   Additional Comments: patient is alert and oriented and will updated family as needed. Per patient request.  Vickii Penna, LCSWA [phone]  Psychiatric & Orthopedics (5N 1-16) Clinical Social Worker

## 2014-02-11 NOTE — Progress Notes (Signed)
Physical Therapy Treatment Patient Details Name: Paige [name] MRN: [id number] DOB: 1945/04/19 Today's Date: 02/11/2014    History of Present Illness Patient is a 69 yo female admitted 02/10/14 now s/p Rt TKA.  PMH:  OA, depression, HTN, RLS, BLE lymphedema, anemia, dizziness, Lt TKA 09/2013.    PT Comments    Pt ambulation limited by R knee and calf pain. Pt is motivated however and compliant with HEP however due to 11/10 R LE pain in WBing pt with limited ambulation. Con't to recommend SNF upon d/c to achieve safe mod I level of function for safe transition home.   Follow Up Recommendations  SNF;Supervision/Assistance - 24 hour     Equipment Recommendations       Recommendations for Other Services       Precautions / Restrictions Precautions Precautions: Knee;Fall Precaution Booklet Issued: Yes (comment) Precaution Comments: educated on zero foam Restrictions Weight Bearing Restrictions: Yes RLE Weight Bearing: Weight bearing as tolerated    Mobility  Bed Mobility Overal bed mobility: Needs Assistance Bed Mobility: Sit to Supine       Sit to supine: Mod assist   General bed mobility comments: modA for R LE management, directional v/c's  Transfers Overall transfer level: Needs assistance Equipment used: Rolling walker (2 wheeled) Transfers: Sit to/from Stand Sit to Stand: Min assist         General transfer comment: Verbal cues for hand placement and technique.  Assist to rise to standing to/from recliner and BSC.  Assist for balance, and to control descent onto [facility] and chair.  Ambulation/Gait Ambulation/Gait assistance: Min assist Ambulation Distance (Feet): 20 Feet (to/from bathrrom) Assistive device: Rolling walker (2 wheeled) Gait Pattern/deviations: Step-to pattern Gait velocity: Decreased Gait velocity interpretation: Below normal speed for age/gender General Gait Details: pt with increased bilat UE WBing, significant R calf pain, antalgic gait  limiting distance   Stairs            Wheelchair Mobility    Modified Rankin (Stroke Patients Only)       Balance Overall balance assessment: Needs assistance         Standing balance support: Single extremity supported;During functional activity Standing balance-Leahy Scale: Poor Standing balance comment: able to perform pericare s/p tolieting                    Cognition Arousal/Alertness: Awake/alert Behavior During Therapy: WFL for tasks assessed/performed Overall Cognitive Status: Within Functional Limits for tasks assessed                      Exercises Total Joint Exercises Quad Sets: AROM;Right;5 reps Heel Slides: AROM;Right;10 reps;Seated    General Comments        Pertinent Vitals/Pain Pain Assessment: 0-10 Pain Score:  (11) Pain Location: R calf and then later R knee Pain Intervention(s): Limited activity within patient's tolerance    Home Living                      Prior Function            PT Goals (current goals can now be found in the care plan section) Progress towards PT goals: Progressing toward goals    Frequency  7X/week    PT Plan Current plan remains appropriate    Co-evaluation             End of Session Equipment Utilized During Treatment: Gait belt;Oxygen Activity Tolerance: Patient limited by pain Patient  left: in bed;with call bell/phone within reach     Time: 1016-1047 PT Time Calculation (min) (ACUTE ONLY): 31 min  Charges:  $Gait Training: 8-22 mins $Therapeutic Exercise: 8-22 mins                    G Codes:      Marcene Brawn 02/11/2014, 1:31 PM   Lewis Shock, PT, DPT Pager #: [phone] Office #: [phone]

## 2014-02-12 DIAGNOSIS — M79669 Pain in unspecified lower leg: Secondary | ICD-10-CM

## 2014-02-12 LAB — CBC
HEMATOCRIT: 39.4 % (ref 36.0–46.0)
Hemoglobin: 13.1 g/dL (ref 12.0–15.0)
MCH: 30.8 pg (ref 26.0–34.0)
MCHC: 33.2 g/dL (ref 30.0–36.0)
MCV: 92.5 fL (ref 78.0–100.0)
Platelets: 154 10*3/uL (ref 150–400)
RBC: 4.26 MIL/uL (ref 3.87–5.11)
RDW: 14.7 % (ref 11.5–15.5)
WBC: 11.5 10*3/uL — ABNORMAL HIGH (ref 4.0–10.5)

## 2014-02-12 NOTE — Progress Notes (Signed)
Report gave to RN Amie Portland at [facility]. Pt is prepared for discharge. Pain is under controlled. Pt is ready to discharge.

## 2014-02-12 NOTE — Progress Notes (Signed)
Right lower extremity venous duplex completed.  Right:  No evidence of DVT, superficial thrombosis, or Baker's cyst.  Left:  Negative for DVT in the common femoral vein.  

## 2014-02-12 NOTE — Clinical Social Work Note (Signed)
Patient to be discharged to [facility]. Patient updated regarding discharge.  [facility] Medicare SNF authorization: [id number]  Facility: [facility] Report number: [phone] Transportation: EMS ([address]  Marcelline Deist, LCSWA [phone]) Licensed Clinical Social Worker Orthopedics [phone]) and Surgical [phone])

## 2014-02-12 NOTE — Discharge Summary (Signed)
Patient ID: Paige [name] MRN: [id number] DOB/AGE: May 02, 1945 69 y.o.  Admit date: 02/10/2014 Discharge date: 02/12/2014  Admission Diagnoses:  Principal Problem:   Osteoarthritis of right knee Active Problems:   Arthritis of right knee   Discharge Diagnoses:  Same  Past Medical History  Diagnosis Date  . Arthritis   . Depression   . Hypertension     not being treated  . Restless legs syndrome   . Lymphedema of both lower extremities   . GERD (gastroesophageal reflux disease)   . H/O hiatal hernia   . Anemia   . PONV (postoperative nausea and vomiting)     after Cholecysectomy    Surgeries: Procedure(s): TOTAL KNEE ARTHROPLASTY on 02/10/2014   Consultants:    Discharged Condition: Improved  Hospital Course: Paige [name] is an 69 y.o. female who was admitted 02/10/2014 for operative treatment ofOsteoarthritis of right knee. Patient has severe unremitting pain that affects sleep, daily activities, and work/hobbies. After pre-op clearance the patient was taken to the operating room on 02/10/2014 and underwent  Procedure(s): TOTAL KNEE ARTHROPLASTY.    Patient was given perioperative antibiotics: Anti-infectives    Start     Dose/Rate Route Frequency Ordered Stop   02/09/14 1115  ceFAZolin (ANCEF) 3 g in dextrose 5 % 50 mL IVPB  Status:  Discontinued     3 g160 mL/hr over 30 Minutes Intravenous On call to O.[name]. 02/09/14 1115 02/10/14 1958       Patient was given sequential compression devices, early ambulation, and chemoprophylaxis to prevent DVT.  Patient benefited maximally from hospital stay and there were no complications.    Recent vital signs: Patient Vitals for the past 24 hrs:  BP Temp Temp src Pulse Resp SpO2  02/12/14 0540 (!) 124/50 mmHg 97.7 F (36.5 C) Oral (!) 103 18 98 %  02/12/14 0035 - 99.7 F (37.6 C) Oral - - -  02/12/14 0000 - - - - 16 95 %  02/11/14 2259 - 100 F (37.8 C) Oral - - -  02/11/14 2000 - - - - 16 95 %  02/11/14 1953 127/66  mmHg (!) 103 F (39.4 C) Oral (!) 107 17 96 %  02/11/14 1804 - (!) 100.8 F (38.2 C) - - - -  02/11/14 1321 (!) 147/63 mmHg (!) 100.9 F (38.3 C) Oral (!) 105 18 97 %     Recent laboratory studies:  Recent Labs  02/11/14 0529 02/12/14 0526  WBC 12.3* 11.5*  HGB 13.2 13.1  HCT 38.7 39.4  PLT 186 154     Discharge Medications:     Medication List    TAKE these medications        aspirin EC 325 MG tablet  Take 1 tablet (325 mg total) by mouth 2 (two) times daily.     IRON COMPLEX PO  Take 1 tablet by mouth daily.     methocarbamol 500 MG tablet  Commonly known as:  ROBAXIN  Take 1 tablet (500 mg total) by mouth 2 (two) times daily with a meal.     multivitamin with minerals tablet  Take 1 tablet by mouth daily.     oxybutynin 5 MG tablet  Commonly known as:  DITROPAN  Take 5 mg by mouth 2 (two) times daily.     oxyCODONE-acetaminophen 5-325 MG per tablet  Commonly known as:  ROXICET  Take 1 tablet by mouth every 4 (four) hours as needed.     pantoprazole 40 MG tablet  Commonly  known as:  PROTONIX  Take 40 mg by mouth at bedtime.     PARoxetine 40 MG tablet  Commonly known as:  PAXIL  Take 40 mg by mouth every morning.     pramipexole 0.25 MG tablet  Commonly known as:  MIRAPEX  Take 0.25 mg by mouth 2 (two) times daily as needed (for restless legs).     VITAMIN B 12 PO  Take 1 tablet by mouth daily.     VITAMIN E PO  Take 3,000 Units by mouth daily.     XANAX 0.25 MG tablet  Generic drug:  ALPRAZolam  Take 0.25 mg by mouth at bedtime.        Diagnostic Studies: No results found.  Disposition: 03-Skilled Nursing Facility      Discharge Instructions    CPM    Complete by:  As directed   Continuous passive motion machine (CPM):      Use the CPM from 0 to 60  for 5 hours per day.      You may increase by 10 degrees per day.  You may break it up into 2 or 3 sessions per day.      Use CPM for 2 weeks or until you are told to stop.     Call  MD / Call 911    Complete by:  As directed   If you experience chest pain or shortness of breath, CALL 911 and be transported to the hospital emergency room.  If you develope a fever above 101 F, pus (white drainage) or increased drainage or redness at the wound, or calf pain, call your surgeon's office.     Change dressing    Complete by:  As directed   Change dressing on 5, then change the dressing daily with sterile 4 x 4 inch gauze dressing and apply TED hose.  You may clean the incision with alcohol prior to redressing.     Constipation Prevention    Complete by:  As directed   Drink plenty of fluids.  Prune juice may be helpful.  You may use a stool softener, such as Colace (over the counter) 100 mg twice a day.  Use MiraLax (over the counter) for constipation as needed.     Diet - low sodium heart healthy    Complete by:  As directed      Discharge instructions    Complete by:  As directed   Follow up in office with Dr. Turner [name] in 2 weeks.     Driving restrictions    Complete by:  As directed   No driving for 2 weeks     Increase activity slowly as tolerated    Complete by:  As directed      Patient may shower    Complete by:  As directed   You may shower without a dressing once there is no drainage.  Do not wash over the wound.  If drainage remains, cover wound with plastic wrap and then shower.           Follow-up Information    Follow up with Paige Lewandowsky, MD In 2 weeks.   Specialty:  Orthopedic Surgery   Contact information:   [address] [place] [address] [phone]        Signed: Vear Clock Daniel Johndrow [name] 02/12/2014, 8:05 AM

## 2014-02-12 NOTE — Progress Notes (Signed)
PATIENT ID: Paige [name]  MRN: [id number]  DOB/AGE:  04/16/1945 / 69 y.o.  2 Days Post-Op Procedure(s) (LRB): TOTAL KNEE ARTHROPLASTY (Right)    PROGRESS NOTE Subjective: Patient is alert, oriented, no Nausea, no Vomiting, yes passing gas, no Bowel Movement. Taking PO well. Denies SOB, Chest or Calf Pain. Using Incentive Spirometer, PAS in place. Ambulate WBAT, CPM 0-60 Patient reports pain as 4 on 0-10 scale  .    Objective: Vital signs in last 24 hours: Filed Vitals:   02/11/14 2259 02/12/14 0000 02/12/14 0035 02/12/14 0540  BP:    124/50  Pulse:    103  Temp: 100 F (37.8 C)  99.7 F (37.6 C) 97.7 F (36.5 C)  TempSrc: Oral  Oral Oral  Resp:  16  18  Height:      Weight:      SpO2:  95%  98%      Intake/Output from previous day: I/O last 3 completed shifts: In: 2639.2 [P.O.:860; I.V.:1779.2] Out: 200 [Drains:200]   Intake/Output this shift:     LABORATORY DATA:  Recent Labs  02/11/14 0529 02/12/14 0526  WBC 12.3* 11.5*  HGB 13.2 13.1  HCT 38.7 39.4  PLT 186 154    Examination: Neurologically intact Neurovascular intact Sensation intact distally Intact pulses distally Dorsiflexion/Plantar flexion intact Incision: dressing C/D/I No cellulitis present Compartment soft}  Assessment:   2 Days Post-Op Procedure(s) (LRB): TOTAL KNEE ARTHROPLASTY (Right) ADDITIONAL DIAGNOSIS: Expected Acute Blood Loss Anemia, morbid obesity  Plan: PT/OT WBAT, CPM 5/hrs day until ROM 0-90 degrees, then D/C CPM DVT Prophylaxis:  SCDx72hrs, ASA 325 mg BID x 2 weeks DISCHARGE PLAN: Skilled Nursing Facility/Rehab, when bed available DISCHARGE NEEDS: HHPT, HHRN, CPM, Walker and 3-in-1 comode seat     Paige [name] R 02/12/2014, 8:02 AM

## 2014-02-12 NOTE — Progress Notes (Signed)
Physical Therapy Treatment Patient Details Name: Paige [name] MRN: [id number] DOB: Sep 16, 1945 Today's Date: 02/12/2014    History of Present Illness Patient is a 69 yo female admitted 02/10/14 now s/p Rt TKA.  PMH:  OA, depression, HTN, RLS, BLE lymphedema, anemia, dizziness, Lt TKA 09/2013.    PT Comments    Pt continues to be limited by pain. Required max (A) for ADLS at [facility]. Max encouragement to ambulate within room. Pt remains appropriate for SNF prior to returning home.   Follow Up Recommendations  SNF;Supervision/Assistance - 24 hour     Equipment Recommendations  None recommended by PT    Recommendations for Other Services       Precautions / Restrictions Precautions Precautions: Knee;Fall Precaution Booklet Issued: Yes (comment) Precaution Comments: reviewed importance of no pillow under knee  Restrictions Weight Bearing Restrictions: Yes RLE Weight Bearing: Weight bearing as tolerated    Mobility  Bed Mobility               General bed mobility comments: up in chair   Transfers Overall transfer level: Needs assistance Equipment used: Rolling walker (2 wheeled) Transfers: Sit to/from [employer] Sit to Stand: Min assist Stand pivot transfers: Min assist       General transfer comment: initially performed SPT to [facility] due to urgency; cues for hand placement and min (A) to balance and (A) with powering up   Ambulation/Gait Ambulation/Gait assistance: Min assist Ambulation Distance (Feet): 14 Feet Assistive device: Rolling walker (2 wheeled) Gait Pattern/deviations: Step-to pattern;Decreased stance time - right;Decreased step length - left;Antalgic;Trunk flexed;Wide base of support Gait velocity: Decreased Gait velocity interpretation: Below normal speed for age/gender General Gait Details: cues for upright posture and gt sequencing; pt crying out when attempting to WB through Rt LE; min (A) to manage RW; pt limited by  pain   Stairs            Wheelchair Mobility    Modified Rankin (Stroke Patients Only)       Balance Overall balance assessment: Needs assistance         Standing balance support: During functional activity;Bilateral upper extremity supported Standing balance-Leahy Scale: Poor Standing balance comment: pt requiring max (A) for pericare; RW at all times to balance                     Cognition Arousal/Alertness: Awake/alert Behavior During Therapy: Anxious Overall Cognitive Status: Within Functional Limits for tasks assessed                      Exercises Total Joint Exercises Ankle Circles/Pumps: AROM;Both;10 reps;Seated Quad Sets: AROM;Right;10 reps;Seated Heel Slides: AAROM;Right;10 reps;Seated Hip ABduction/ADduction: AAROM;Right;10 reps;Seated Long Arc Quad: AAROM;Right;10 reps (2 sets of 5 )    General Comments        Pertinent Vitals/Pain Pain Assessment: 0-10 Pain Score: 9  Pain Location: Rt knee with movement  Pain Descriptors / Indicators: Discomfort;Crying Pain Intervention(s): Monitored during session;Premedicated before session;Repositioned;Ice applied    Home Living                      Prior Function            PT Goals (current goals can now be found in the care plan section) Acute Rehab PT Goals Patient Stated Goal: to go to rehab today PT Goal Formulation: With patient Time For Goal Achievement: 02/17/14 Potential to Achieve Goals: Good Progress towards PT goals:  Progressing toward goals    Frequency  7X/week    PT Plan Current plan remains appropriate    Co-evaluation             End of Session Equipment Utilized During Treatment: Gait belt Activity Tolerance: Patient limited by pain Patient left: in chair;with call bell/phone within reach     Time: 1610-9604 PT Time Calculation (min) (ACUTE ONLY): 25 min  Charges:  $Gait Training: 8-22 mins $Therapeutic Exercise: 8-22 mins                     G CodesDonnamarie Poag [place], [place]  [phone] 02/12/2014, 2:20 PM

## 2014-03-17 ENCOUNTER — Encounter: Payer: Self-pay | Admitting: Physical Therapy

## 2014-03-17 ENCOUNTER — Ambulatory Visit: Payer: Medicare Other | Attending: Orthopedic Surgery | Admitting: Physical Therapy

## 2014-03-17 DIAGNOSIS — M25661 Stiffness of right knee, not elsewhere classified: Secondary | ICD-10-CM | POA: Diagnosis present

## 2014-03-17 DIAGNOSIS — M25561 Pain in right knee: Secondary | ICD-10-CM

## 2014-03-17 NOTE — Therapy (Signed)
[facility] [place] [facility] [address] [facility] [address] [facility] [address] [place], [place], [address] Phone: [phone]   Fax:  [phone]  Physical Therapy Evaluation  Patient Details  Name: Paige [name] MRN: [id number] Date of Birth: 09-14-1945 Referring Provider:  Duane Lope, MD  Encounter Date: 03/17/2014      PT End of Session - 03/17/14 1338    Visit Number 1   Number of Visits 16   Date for PT Re-Evaluation 05/16/14   PT Start Time 1310   PT Stop Time 1418   PT Time Calculation (min) 68 min      Past Medical History  Diagnosis Date  . Arthritis   . Depression   . Hypertension     not being treated  . Restless legs syndrome   . Lymphedema of both lower extremities   . GERD (gastroesophageal reflux disease)   . H/O hiatal hernia   . Anemia   . PONV (postoperative nausea and vomiting)     after Cholecysectomy    Past Surgical History  Procedure Laterality Date  . Cholecystectomy    . Abdominal hysterectomy    . Eye surgery Bilateral     cataracts  . Fracture surgery Right     arm age 22  . Fracture surgery Bilateral     both thumbs  . Colonoscopy    . 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 Laparoscopic gastric banding      2010  . Total knee arthroplasty Right 02/10/2014    dr Turner Daniels  . Total knee arthroplasty Right 02/10/2014    Procedure: TOTAL KNEE ARTHROPLASTY;  Surgeon: Nestor Lewandowsky, MD;  [facility];  Service: Orthopedics;  Laterality: Right;    There were no vitals taken for this visit.  Visit Diagnosis:  Knee stiffness, right - Plan: PT plan of care cert/re-cert  Right knee pain - Plan: PT plan of care cert/re-cert      Subjective Assessment - 03/17/14 1321    Symptoms S/P right TKR 02/10/14, 2 day hospital stay and then 2 weeks at rehab and then 2 weeks with Home PT   Pertinent History  left TKR September 2015   Limitations Standing;Walking   How long can you walk comfortably? slow but 5 minutes max   Patient Stated Goals walk and shop without difficulty or pain   Currently in Pain? Yes   Pain Score 2    Pain Location Knee   Pain Orientation Right   Pain Descriptors / Indicators Aching   Pain Type Acute pain   Pain Onset More than a month ago   Pain Frequency Constant   Aggravating Factors  standing and walking   Pain Relieving Factors rest          [facility] PT Assessment - 03/17/14 0001    Assessment   Medical Diagnosis S/P right TKR 02/10/14   Onset Date 02/10/14   Next MD Visit April 08, 2014   Prior Therapy home and rehab   Precautions   Precautions None   Restrictions   Weight Bearing Restrictions No   Balance Screen   Has the patient fallen in the past 6 months No   Has the patient had a decrease in activity level because of a fear of falling?  No   Is the patient reluctant to leave their home because of  a fear of falling?  No   Home Environment   Additional Comments no stairs, lives alone   Prior Function   Level of Independence Independent with basic ADLs;Independent with homemaking with ambulation   Leisure some exercise   Observation/Other Assessments   Skin Integrity intact   AROM   Overall AROM Comments AROM of the right knee 15-95 degrees flexion   PROM   Overall PROM Comments PROM of the right knee 7-100 degrees flexion   Palpation   Palpation scar is mobile, she has lymphedema in the LE with pitting edema in the shins, slightly pink   Special Tests    Special Tests --  quad lag   Ambulation/Gait   Gait Comments ambulates without assistive device, slow, antalgic on the right stiff legged on the right   6 Minute Walk- Baseline   6 Minute Walk- Baseline --  Timed up and go test 17 seconds                  [facility] Adult PT Treatment/Exercise - 2014-03-26 0001    Knee/Hip Exercises: Aerobic   Stationary Bike 5 minutes    Elliptical NuStep L5 x 6 minutes   Knee/Hip Exercises: Machines for Strengthening   Cybex Knee Extension 5# 3x10   Cybex Knee Flexion 35# 3x10                  PT Short Term Goals - 03/26/14 1343    PT SHORT TERM GOAL #1   Title independent with HEP   Time 3   Period Weeks           PT Long Term Goals - March 26, 2014 1343    PT LONG TERM GOAL #1   Title independent with RICE   Time 8   Period Weeks   Status New   PT LONG TERM GOAL #2   Title increase AROM of the right knee to 5-110 degrees flexion   Time 8   Period Weeks   Status New   PT LONG TERM GOAL #3   Title walk to shop without difficulty of pain   Time 8   Period Weeks   Status New   PT LONG TERM GOAL #4   Title decrease pain 50%   Time 8   Period Weeks   Status New               Plan - 2014-03-26 1338    Clinical Impression Statement Patient underwent a right TKR on 02/10/14.  She had a 2 week rehab stay, has hx of left TKR in September 2105.  She has decreased ROM, significant edema and some pain, pain is mostly in the right calf and in the adductors, she was tested and was negative for a blood clot   Pt will benefit from skilled therapeutic intervention in order to improve on the following deficits Abnormal gait;Difficulty walking;Decreased strength;Decreased range of motion;Decreased endurance;Increased edema   Rehab Potential Good   PT Frequency 2x / week   PT Duration 8 weeks   PT Treatment/Interventions Electrical Stimulation;Ultrasound;Therapeutic activities;Balance training;Gait training;Therapeutic exercise;Patient/family education   PT Next Visit Plan Add exercises   Consulted and Agree with Plan of Care Patient          G-Codes - 2014/03/26 1345    Functional Assessment Tool Used FOTO   Functional Limitation Mobility: Walking and moving around   Mobility: Walking and Moving Around Current Status (N8295) At least 40 percent but less than 60 percent  impaired, limited or restricted    Mobility: Walking and Moving Around Goal Status [phone]) At least 40 percent but less than 60 percent impaired, limited or restricted       Problem List Patient Active Problem List   Diagnosis Date Noted  . Arthritis of right knee 02/10/2014  . Primary osteoarthritis of right knee 02/09/2014  . Arthritis of knee, left 09/25/2013  . Arthritis of knee 09/25/2013    Jearld Lesch, PT 03/17/2014, 1:52 PM  [facility] [place] [facility] [address] [facility] [address] [place], [place], [address] Phone: [phone]   Fax:  [phone]

## 2014-03-20 ENCOUNTER — Encounter: Payer: Self-pay | Admitting: Physical Therapy

## 2014-03-20 ENCOUNTER — Ambulatory Visit: Payer: Medicare Other | Admitting: Physical Therapy

## 2014-03-20 DIAGNOSIS — M25661 Stiffness of right knee, not elsewhere classified: Secondary | ICD-10-CM

## 2014-03-20 DIAGNOSIS — M25561 Pain in right knee: Secondary | ICD-10-CM

## 2014-03-20 NOTE — Therapy (Signed)
[place], [place], [address] Phone: [phone]   Fax:  [phone]  Physical Therapy Treatment  Patient Details  Name: LINETTA REGNER MRN: [id number] Date of Birth: 09/22/1945 Referring Provider:  Kerin Salen, MD  Encounter Date: 03/20/2014      PT End of Session - 03/20/14 0950    Visit Number 2   PT Start Time 0850   PT Stop Time 1000   PT Time Calculation (min) 70 min      Past Medical History  Diagnosis Date  . Arthritis   . Depression   . Hypertension     not being treated  . Restless legs syndrome   . Lymphedema of both lower extremities   . GERD (gastroesophageal reflux disease)   . H/O hiatal hernia   . Anemia   . PONV (postoperative nausea and vomiting)     after Cholecysectomy    Past Surgical History  Procedure Laterality Date  . Cholecystectomy    . Abdominal hysterectomy    . Eye surgery Bilateral     cataracts  . Fracture surgery Right     arm age 88  . Fracture surgery Bilateral     both thumbs  . Colonoscopy    . Total knee arthroplasty Left 09/25/2013    Procedure: TOTAL KNEE ARTHROPLASTY;  Surgeon: Kerin Salen, MD;  Location: [place];  Service: Orthopedics;  Laterality: Left;  . Laparoscopic gastric banding      2010  . Total knee arthroplasty Right 02/10/2014    dr Mayer Camel  . Total knee arthroplasty Right 02/10/2014    Procedure: TOTAL KNEE ARTHROPLASTY;  Surgeon: Kerin Salen, MD;  Location: [place];  Service: Orthopedics;  Laterality: Right;    There were no vitals taken for this visit.  Visit Diagnosis:  Knee stiffness, right  Right knee pain      Subjective Assessment - 03/20/14 0855    Symptoms RT knee stiffness,tenderness throughout LE   Currently in Pain? Yes   Pain Score 3           [facility] PT Assessment - 03/20/14 0001    AROM   Overall AROM Comments AROM of the RT knee seated after ther ex 4-110                  [facility]  Adult PT Treatment/Exercise - 03/20/14 0001    Exercises   Exercises Knee/Hip   Knee/Hip Exercises: Aerobic   Elliptical NuStep L5 x 6 minutes   Tread Mill Elliptical 2 fwd/2 back   Knee/Hip Exercises: Machines for Strengthening   Cybex Knee Extension 5# 2 sets 10, RT only 10 times   Cybex Knee Flexion 35# 2 sets 10, 25# Rt only 10 times   Knee/Hip Exercises: Standing   Walking with Sports Cord 30 # 3 times each way   Knee/Hip Exercises: Supine   Other Supine Knee Exercises bridge with ball,PTC with ball, obl with ball 15 times each   Modalities   Modalities Electrical Stimulation;Moist Heat   Moist Heat Therapy   Number Minutes Moist Heat 15 Minutes   Moist Heat Location --  HS and add on RT   Electrical Stimulation   Electrical Stimulation Location --  adductor muscle and RT knee   Electrical Stimulation Action IFC   Electrical Stimulation Goals Pain   Knee/Hip Exercises: Machines for Strengthening   Cybex Leg Press 30# 3 SETS 10, Calf raises  2 sets 15                  PT Short Term Goals - 03/20/14 0951    PT SHORT TERM GOAL #1   Baseline doing HEP for home PT   Status Achieved           PT Long Term Goals - 03/20/14 0951    PT LONG TERM GOAL #1   Status Achieved               Plan - 03/20/14 0950    Clinical Impression Statement pt tolerated treatment well,very sensative to touch in bilateral LE more soft tissue, added water pill so decreased LE swelling. Increased ROM after ther ex, quad weakness        Problem List Patient Active Problem List   Diagnosis Date Noted  . Arthritis of right knee 02/10/2014  . Primary osteoarthritis of right knee 02/09/2014  . Arthritis of knee, left 09/25/2013  . Arthritis of knee 09/25/2013    PAYSEUR,ANGIE,PTA 03/20/2014, 9:52 AM  [place] [address] [place], [place], [address] Phone: [phone]   Fax:  [phone]

## 2014-03-25 ENCOUNTER — Ambulatory Visit: Payer: Medicare Other | Attending: Orthopedic Surgery | Admitting: Physical Therapy

## 2014-03-25 ENCOUNTER — Encounter: Payer: Self-pay | Admitting: Physical Therapy

## 2014-03-25 DIAGNOSIS — M25661 Stiffness of right knee, not elsewhere classified: Secondary | ICD-10-CM | POA: Diagnosis present

## 2014-03-25 DIAGNOSIS — M25561 Pain in right knee: Secondary | ICD-10-CM | POA: Diagnosis not present

## 2014-03-25 NOTE — Therapy (Signed)
[facility] [address] [facility] [facility] [address] [place], [place], [address] Phone: [phone]   Fax:  [phone]  Physical Therapy Treatment  Patient Details  Name: Paige [name] MRN: [id number] Date of Birth: 1945-05-16 Referring Provider:  Nestor Lewandowsky, MD  Encounter Date: 03/25/2014      PT End of Session - 03/25/14 1401    Visit Number 3   PT Start Time 1315   PT Stop Time 1400   PT Time Calculation (min) 45 min      Past Medical History  Diagnosis Date  . Arthritis   . Depression   . Hypertension     not being treated  . Restless legs syndrome   . Lymphedema of both lower extremities   . GERD (gastroesophageal reflux disease)   . H/O hiatal hernia   . Anemia   . PONV (postoperative nausea and vomiting)     after Cholecysectomy    Past Surgical History  Procedure Laterality Date  . Cholecystectomy    . Abdominal hysterectomy    . Eye surgery Bilateral     cataracts  . Fracture surgery Right     arm age 69  . Fracture surgery Bilateral     both thumbs  . Colonoscopy    . 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 Laparoscopic gastric banding      2010  . Total knee arthroplasty Right 02/10/2014    dr Turner Daniels  . Total knee arthroplasty Right 02/10/2014    Procedure: TOTAL KNEE ARTHROPLASTY;  Surgeon: Nestor Lewandowsky, MD;  [facility];  Service: Orthopedics;  Laterality: Right;    There were no vitals taken for this visit.  Visit Diagnosis:  Knee stiffness, right      Subjective Assessment - 03/25/14 1324    Symptoms tired after last session but muscles feel better   Currently in Pain? Yes   Pain Score 3    Pain Location Knee                    [facility] Adult PT Treatment/Exercise - 03/25/14 0001    Knee/Hip Exercises: Aerobic   Elliptical NuStep L5 x 6 minutes   Knee/Hip Exercises: Machines for Strengthening   Cybex Knee Extension 5# 2 sets 10, RT only 10 times   Cybex Knee Flexion 35# 2 sets 10, 25# Rt only 10 times   Cybex Leg Press 30# 3 SETS 10, Calf raises 2 sets 15   Knee/Hip Exercises: Standing   Lateral Step Up Right;10 reps;2 sets  6 inch   Forward Step Up Right;2 sets;10 reps  6 inch   Functional Squat 1 set;10 reps   Walking with Sports Cord red tband at ankles side lunges 15 feet 2 times each way   Other Standing Knee Exercises red tband on airex hip 3 way 15 reps each leg   Knee/Hip Exercises: Seated   Other Seated Knee Exercises red tband ER 2 sets 10                  PT Short Term Goals - 03/20/14 0951    PT SHORT TERM GOAL #1   Baseline doing HEP for home PT   Status Achieved           PT Long Term Goals - 03/20/14 6962  PT LONG TERM GOAL #1   Status Achieved               Plan - 03/25/14 1402    Clinical Impression Statement pt with lateral weakness and fatigues easily, improved gait with less muscle pain        Problem List Patient Active Problem List   Diagnosis Date Noted  . Arthritis of right knee 02/10/2014  . Primary osteoarthritis of right knee 02/09/2014  . Arthritis of knee, left 09/25/2013  . Arthritis of knee 09/25/2013    Leotta Weingarten,ANGIE,PTA 03/25/2014, 2:05 PM  [facility] [place] [facility] [address] [facility] [address] [place], [place], [address] Phone: [phone]   Fax:  [phone]

## 2014-03-27 ENCOUNTER — Ambulatory Visit: Payer: Medicare Other | Admitting: Physical Therapy

## 2014-04-01 ENCOUNTER — Encounter: Payer: Self-pay | Admitting: Physical Therapy

## 2014-04-01 ENCOUNTER — Ambulatory Visit: Payer: Medicare Other | Admitting: Physical Therapy

## 2014-04-01 DIAGNOSIS — M25661 Stiffness of right knee, not elsewhere classified: Secondary | ICD-10-CM

## 2014-04-01 DIAGNOSIS — M25561 Pain in right knee: Secondary | ICD-10-CM

## 2014-04-01 NOTE — Therapy (Signed)
[facility] [place] [facility] [address] [facility] [address] [place], [place], [address] Phone: [phone]   Fax:  [phone]  Physical Therapy Treatment  Patient Details  Name: Paige [name] MRN: [id number] Date of Birth: 04-05-1945 Referring Provider:  Gean Birchwood, MD  Encounter Date: 04/01/2014      PT End of Session - 04/01/14 1556    Visit Number 4   Number of Visits 16   Date for PT Re-Evaluation 05/16/14   PT Start Time 1500   PT Stop Time 1605   PT Time Calculation (min) 65 min   Activity Tolerance Patient tolerated treatment well   Behavior During Therapy [facility] for tasks assessed/performed      Past Medical History  Diagnosis Date  . Arthritis   . Depression   . Hypertension     not being treated  . Restless legs syndrome   . Lymphedema of both lower extremities   . GERD (gastroesophageal reflux disease)   . H/O hiatal hernia   . Anemia   . PONV (postoperative nausea and vomiting)     after Cholecysectomy    Past Surgical History  Procedure Laterality Date  . Cholecystectomy    . Abdominal hysterectomy    . Eye surgery Bilateral     cataracts  . Fracture surgery Right     arm age 50  . Fracture surgery Bilateral     both thumbs  . Colonoscopy    . 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 Laparoscopic gastric banding      2010  . Total knee arthroplasty Right 02/10/2014    dr Turner Daniels  . Total knee arthroplasty Right 02/10/2014    Procedure: TOTAL KNEE ARTHROPLASTY;  Surgeon: Nestor Lewandowsky, MD;  [facility];  Service: Orthopedics;  Laterality: Right;    There were no vitals taken for this visit.  Visit Diagnosis:  Knee stiffness, right  Right knee pain      Subjective Assessment - 04/01/14 1506    Symptoms Complains of stiffness   Pertinent History left TKR September 2015   Patient  Stated Goals walk and shop without difficulty or pain   Currently in Pain? Yes   Pain Score 4    Pain Location Knee   Pain Orientation Right   Pain Type Surgical pain   Pain Onset More than a month ago   Pain Frequency Intermittent          [facility] PT Assessment - 04/01/14 0001    ROM / Strength   AROM / PROM / Strength AROM   AROM   AROM Assessment Site Knee   Right/Left Knee Right   Right Knee Extension 0   Right Knee Flexion 100                  [facility] Adult PT Treatment/Exercise - 04/01/14 0001    Exercises   Exercises Knee/Hip   Knee/Hip Exercises: Aerobic   Elliptical 6 minutes  Nustep level 6   Knee/Hip Exercises: Machines for Strengthening   Cybex Knee Flexion 25#  2x15 at EOR   Cybex Leg Press 30#  2x15   Knee/Hip Exercises: Standing   Walking with Sports Cord 30#  5 reps fwd and 5 reps bk   Modalities   Modalities Cryotherapy;Electrical Stimulation   Cryotherapy   Number Minutes Cryotherapy 20  Minutes   Cryotherapy Location Knee  right   Type of Cryotherapy Other (comment)  vaso med pressure   Electrical Stimulation   Electrical Stimulation Location left knee   Electrical Stimulation Action high volt   Electrical Stimulation Goals Edema                  PT Short Term Goals - 04/01/14 1510    PT SHORT TERM GOAL #1   Title independent with HEP   Baseline doing HEP for home PT   Time 3   Period Weeks   Status Achieved           PT Long Term Goals - 04/01/14 1511    PT LONG TERM GOAL #1   Title independent with RICE   Time 8   Period Weeks   Status Achieved   PT LONG TERM GOAL #2   Title increase AROM of the right knee to 5-110 degrees flexion   Time 8   Period Weeks   Status Achieved   PT LONG TERM GOAL #3   Title walk to shop without difficulty of pain   Time 8   Period Weeks   Status On-going   PT LONG TERM GOAL #4   Title decrease pain 50%   Period Weeks   Status On-going               Plan -  04/01/14 1553    Clinical Impression Statement Pitting edema in RLE.  Difficulty with sit to stand from low surfaces.   Pt will benefit from skilled therapeutic intervention in order to improve on the following deficits Abnormal gait;Difficulty walking;Decreased strength;Decreased range of motion;Decreased endurance;Increased edema   Rehab Potential Good   PT Duration 8 weeks   PT Treatment/Interventions Electrical Stimulation;Ultrasound;Therapeutic activities;Balance training;Gait training;Therapeutic exercise;Patient/family education   PT Next Visit Plan Continue to strengthen.  Start gait and balance.   Consulted and Agree with Plan of Care Patient        Problem List Patient Active Problem List   Diagnosis Date Noted  . Arthritis of right knee 02/10/2014  . Primary osteoarthritis of right knee 02/09/2014  . Arthritis of knee, left 09/25/2013  . Arthritis of knee 09/25/2013    Janaiyah Blackard PTA 04/01/2014, 3:59 PM  [facility] [place] [facility] [address] [facility] [address] [place], [place], [address] Phone: [phone]   Fax:  [phone]

## 2014-04-03 ENCOUNTER — Ambulatory Visit: Payer: Medicare Other | Admitting: Physical Therapy

## 2014-04-03 ENCOUNTER — Encounter: Payer: Self-pay | Admitting: Physical Therapy

## 2014-04-03 DIAGNOSIS — M25661 Stiffness of right knee, not elsewhere classified: Secondary | ICD-10-CM

## 2014-04-03 NOTE — Therapy (Signed)
[facility] [place] [facility] [address] [facility] [facility] [place], [place], [address] Phone: [phone]   Fax:  [phone]  Physical Therapy Treatment  Patient Details  Name: Paige [name] MRN: [id number] Date of Birth: [birth year] Referring Provider:  Gean Birchwood, MD  Encounter Date: 04/03/2014      PT End of Session - 04/03/14 1539    Visit Number 5   PT Start Time 1450   PT Stop Time 1600   PT Time Calculation (min) 70 min      Past Medical History  Diagnosis Date  . Arthritis   . Depression   . Hypertension     not being treated  . Restless legs syndrome   . Lymphedema of both lower extremities   . GERD (gastroesophageal reflux disease)   . H/O hiatal hernia   . Anemia   . PONV (postoperative nausea and vomiting)     after Cholecysectomy    Past Surgical History  Procedure Laterality Date  . Cholecystectomy    . Abdominal hysterectomy    . Eye surgery Bilateral     cataracts  . Fracture surgery Right     arm age 41  . Fracture surgery Bilateral     both thumbs  . Colonoscopy    . 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 Laparoscopic gastric banding      2010  . Total knee arthroplasty Right 02/10/2014    dr Turner Daniels  . Total knee arthroplasty Right 02/10/2014    Procedure: TOTAL KNEE ARTHROPLASTY;  Surgeon: Nestor Lewandowsky, MD;  [facility];  Service: Orthopedics;  Laterality: Right;    There were no vitals filed for this visit.  Visit Diagnosis:  Knee stiffness, right      Subjective Assessment - 04/03/14 1448    Symptoms difficulty getting in and out of car   Currently in Pain? Yes   Pain Score 3    Pain Location Knee   Pain Orientation Right   Pain Descriptors / Indicators Aching                       [facility] Adult PT Treatment/Exercise - 04/03/14 0001    Transfers   Transfers Sit to Stand  low surface with weight ball   Knee/Hip Exercises: Aerobic   Elliptical 6 minutes  Nustep level 6   Tread Mill Elliptical 2 fwd/2 back   Knee/Hip Exercises: Machines for Strengthening   Cybex Knee Extension 5# 2 sets 10, RT only 10 times   Cybex Knee Flexion 25#  2x15 at EOR   Cybex Leg Press 40#  2x15   Knee/Hip Exercises: Plyometrics   Other Plyometric Exercises 5# up/over and back 15 times fwd and sideways   Knee/Hip Exercises: Seated   Other Seated Knee Exercises 5# seated RT IR/ER 2 sets 15                  PT Short Term Goals - 04/01/14 1510    PT SHORT TERM GOAL #1   Title independent with HEP   Baseline doing HEP for home PT   Time 3   Period Weeks   Status Achieved           PT Long Term Goals - 04/01/14 1511    PT LONG TERM GOAL #1  Title independent with RICE   Time 8   Period Weeks   Status Achieved   PT LONG TERM GOAL #2   Title increase AROM of the right knee to 5-110 degrees flexion   Time 8   Period Weeks   Status Achieved   PT LONG TERM GOAL #3   Title walk to shop without difficulty of pain   Time 8   Period Weeks   Status On-going   PT LONG TERM GOAL #4   Title decrease pain 50%   Period Weeks   Status On-going               Plan - 04/03/14 1539    Clinical Impression Statement increased ex with rotation and picking feet up to make car and tub transfers easier, pt had difficulty and required freq. rest        Problem List Patient Active Problem List   Diagnosis Date Noted  . Arthritis of right knee 02/10/2014  . Primary osteoarthritis of right knee 02/09/2014  . Arthritis of knee, left 09/25/2013  . Arthritis of knee 09/25/2013    PAYSEUR,ANGIE,PTA 04/03/2014, 3:41 PM  [facility] [place] [facility] [address] [facility] [address] [place], [place], [address] Phone: [phone]   Fax:  [phone]

## 2014-04-08 ENCOUNTER — Ambulatory Visit: Payer: Medicare Other | Admitting: Physical Therapy

## 2014-04-08 ENCOUNTER — Encounter: Payer: Self-pay | Admitting: Physical Therapy

## 2014-04-08 DIAGNOSIS — M25561 Pain in right knee: Secondary | ICD-10-CM

## 2014-04-08 DIAGNOSIS — M25661 Stiffness of right knee, not elsewhere classified: Secondary | ICD-10-CM | POA: Diagnosis not present

## 2014-04-08 NOTE — Therapy (Signed)
[facility] [place] [address] [place], [place], [address] Phone: [phone]   Fax:  [phone]  Physical Therapy Treatment  Patient Details  Name: Paige [name] MRN: [id number] Date of Birth: [birth year] Referring Provider:  Gean Birchwood, MD  Encounter Date: 04/08/2014      PT End of Session - 04/08/14 0926    PT Start Time 0847   PT Stop Time 0926   PT Time Calculation (min) 39 min   Activity Tolerance Patient tolerated treatment well;Patient limited by fatigue   Behavior During Therapy [facility] for tasks assessed/performed      Past Medical History  Diagnosis Date  . Arthritis   . Depression   . Hypertension     not being treated  . Restless legs syndrome   . Lymphedema of both lower extremities   . GERD (gastroesophageal reflux disease)   . H/O hiatal hernia   . Anemia   . PONV (postoperative nausea and vomiting)     after Cholecysectomy    Past Surgical History  Procedure Laterality Date  . Cholecystectomy    . Abdominal hysterectomy    . Eye surgery Bilateral     cataracts  . Fracture surgery Right     arm age 56  . Fracture surgery Bilateral     both thumbs  . Colonoscopy    . 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 Laparoscopic gastric banding      2010  . Total knee arthroplasty Right 02/10/2014    dr Turner Daniels  . Total knee arthroplasty Right 02/10/2014    Procedure: TOTAL KNEE ARTHROPLASTY;  Surgeon: Nestor Lewandowsky, MD;  Location: [facility] OR;  Service: Orthopedics;  Laterality: Right;    There were no vitals filed for this visit.  Visit Diagnosis:  Knee stiffness, right  Right knee pain      Subjective Assessment - 04/08/14 0847    Symptoms Getting better with getting in and out of car.   Currently in Pain? No/denies            [facility] PT Assessment - 04/08/14  0001    ROM / Strength   AROM / PROM / Strength AROM   AROM   Right/Left Knee Right   Right Knee Extension 0   Right Knee Flexion 102                   [facility] Adult PT Treatment/Exercise - 04/08/14 0001    Exercises   Exercises Knee/Hip   Knee/Hip Exercises: Aerobic   Stationary Bike 6 minutes   Elliptical 6 minutes  Nustep level 6   Tread Mill 2 minutes  1 min each sidestepping   Knee/Hip Exercises: Machines for Strengthening   Cybex Leg Press no weight for ROM   Knee/Hip Exercises: Standing   Other Standing Knee Exercises 50# pulley leg press  2x15   Knee/Hip Exercises: Seated   Other Seated Knee Exercises glute medius with yellow theraband  2x15                  PT Short Term Goals - 04/01/14 1510    PT SHORT TERM GOAL #1   Title independent with HEP   Baseline doing HEP for home PT   Time 3   Period Weeks   Status Achieved  PT Long Term Goals - 04/08/14 0851    PT LONG TERM GOAL #1   Title independent with RICE   Time 8   Period Weeks   Status Achieved   PT LONG TERM GOAL #2   Title increase AROM of the right knee to 5-110 degrees flexion   Time 8   Period Weeks   Status Achieved   PT LONG TERM GOAL #3   Title walk to shop without difficulty of pain   Period Weeks   Status On-going   PT LONG TERM GOAL #4   Title decrease pain 50%   Time 8   Period Weeks   Status Achieved               Plan - 04/08/14 1610    Clinical Impression Statement Decreased strength in bilateral glute medius.  Left is weaker than right.   Pt will benefit from skilled therapeutic intervention in order to improve on the following deficits Abnormal gait;Difficulty walking;Decreased strength;Decreased range of motion;Decreased endurance;Increased edema   Rehab Potential Good   PT Frequency 2x / week   PT Duration 8 weeks   PT Next Visit Plan Continue to strengthen.   Consulted and Agree with Plan of Care Patient        Problem  List Patient Active Problem List   Diagnosis Date Noted  . Arthritis of right knee 02/10/2014  . Primary osteoarthritis of right knee 02/09/2014  . Arthritis of knee, left 09/25/2013  . Arthritis of knee 09/25/2013    Savier Trickett PTA 04/08/2014, 9:30 AM  [facility] [place] [address] [place], [place], [address] Phone: [phone]   Fax:  [phone]

## 2014-04-10 ENCOUNTER — Encounter: Payer: Self-pay | Admitting: Physical Therapy

## 2014-04-10 ENCOUNTER — Ambulatory Visit: Payer: Medicare Other | Admitting: Physical Therapy

## 2014-04-10 DIAGNOSIS — M25561 Pain in right knee: Secondary | ICD-10-CM

## 2014-04-10 DIAGNOSIS — M25661 Stiffness of right knee, not elsewhere classified: Secondary | ICD-10-CM | POA: Diagnosis not present

## 2014-04-10 DIAGNOSIS — M7989 Other specified soft tissue disorders: Secondary | ICD-10-CM

## 2014-04-10 NOTE — Therapy (Signed)
[facility] [place] [address] [place], [place], [address] Phone: [phone]   Fax:  [phone]  Physical Therapy Treatment  Patient Details  Name: Paige [name] MRN: [id number] Date of Birth: 1945/10/18 Referring Provider:  Gean Birchwood, MD  Encounter Date: 04/10/2014      PT End of Session - 04/10/14 1721    Visit Number 6   Number of Visits 16   Date for PT Re-Evaluation 05/16/14   PT Start Time 1638   PT Stop Time 1740   PT Time Calculation (min) 62 min      Past Medical History  Diagnosis Date  . Arthritis   . Depression   . Hypertension     not being treated  . Restless legs syndrome   . Lymphedema of both lower extremities   . GERD (gastroesophageal reflux disease)   . H/O hiatal hernia   . Anemia   . PONV (postoperative nausea and vomiting)     after Cholecysectomy    Past Surgical History  Procedure Laterality Date  . Cholecystectomy    . Abdominal hysterectomy    . Eye surgery Bilateral     cataracts  . Fracture surgery Right     arm age 76  . Fracture surgery Bilateral     both thumbs  . Colonoscopy    . 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 Laparoscopic gastric banding      2010  . Total knee arthroplasty Right 02/10/2014    dr Turner Daniels  . Total knee arthroplasty Right 02/10/2014    Procedure: TOTAL KNEE ARTHROPLASTY;  Surgeon: Nestor Lewandowsky, MD;  [facility];  Service: Orthopedics;  Laterality: Right;    There were no vitals filed for this visit.  Visit Diagnosis:  Knee stiffness, right  Right knee pain  Swelling of limb      Subjective Assessment - 04/10/14 1649    Symptoms Went to the MD, he was pleased.  Able to get in and out of car without a grimace today   Pertinent History left TKR September 2015   Limitations Standing;Walking   How  long can you walk comfortably? slow but 5 minutes max   Patient Stated Goals walk and shop without difficulty or pain   Currently in Pain? No/denies   Aggravating Factors  getting in and out of the car causes some pain            [facility] PT Assessment - 04/10/14 0001    AROM   Right Knee Extension 0   Right Knee Flexion 110   PROM   Overall PROM Comments PROM of the right knee to 116 degrees flexion                   [facility] Adult PT Treatment/Exercise - 04/10/14 0001    Knee/Hip Exercises: Aerobic   Stationary Bike 6 minutes   Elliptical 6 minutes   Knee/Hip Exercises: Machines for Strengthening   Cybex Knee Extension 10# 2x15   Cybex Knee Flexion 35# 2x 15   Knee/Hip Exercises: Standing   Forward Lunges 2 sets;5 reps  lunge stretch with foot on mat   Walking with Sports Cord resisted gait all directions   Gait Training worked on speed with HHA and cues for steps   Cryotherapy   Number Minutes  Cryotherapy 15 Minutes   Cryotherapy Location Knee   Electrical Stimulation   Electrical Stimulation Location right knee   Electrical Stimulation Parameters High Volt   Electrical Stimulation Goals Edema   Manual Therapy   Manual Therapy Passive ROM   Passive ROM right knee flexion                  PT Short Term Goals - 04/01/14 1510    PT SHORT TERM GOAL #1   Title independent with HEP   Baseline doing HEP for home PT   Time 3   Period Weeks   Status Achieved           PT Long Term Goals - 04/08/14 5176    PT LONG TERM GOAL #1   Title independent with RICE   Time 8   Period Weeks   Status Achieved   PT LONG TERM GOAL #2   Title increase AROM of the right knee to 5-110 degrees flexion   Time 8   Period Weeks   Status Achieved   PT LONG TERM GOAL #3   Title walk to shop without difficulty of pain   Period Weeks   Status On-going   PT LONG TERM GOAL #4   Title decrease pain 50%   Time 8   Period Weeks   Status Achieved                Plan - 04/10/14 1722    Clinical Impression Statement Doing better just feel that I walk stiff   Pt will benefit from skilled therapeutic intervention in order to improve on the following deficits Abnormal gait;Difficulty walking;Decreased strength;Decreased range of motion;Decreased endurance;Increased edema   Rehab Potential Good   PT Frequency 2x / week   PT Duration 8 weeks   PT Treatment/Interventions Electrical Stimulation;Ultrasound;Therapeutic activities;Balance training;Gait training;Therapeutic exercise;Patient/family education   PT Next Visit Plan work on gait   Consulted and Agree with Plan of Care Patient        Problem List Patient Active Problem List   Diagnosis Date Noted  . Arthritis of right knee 02/10/2014  . Primary osteoarthritis of right knee 02/09/2014  . Arthritis of knee, left 09/25/2013  . Arthritis of knee 09/25/2013    Jearld Lesch, PT 04/10/2014, 5:24 PM  [facility] [place] [address] [place], [place], [address] Phone: [phone]   Fax:  [phone]

## 2014-05-27 ENCOUNTER — Ambulatory Visit: Payer: Medicare Other | Attending: Orthopedic Surgery | Admitting: Physical Therapy

## 2014-05-27 ENCOUNTER — Encounter: Payer: Self-pay | Admitting: Physical Therapy

## 2014-05-27 DIAGNOSIS — M545 Low back pain, unspecified: Secondary | ICD-10-CM

## 2014-05-27 DIAGNOSIS — M25561 Pain in right knee: Secondary | ICD-10-CM | POA: Insufficient documentation

## 2014-05-27 DIAGNOSIS — M25661 Stiffness of right knee, not elsewhere classified: Secondary | ICD-10-CM | POA: Diagnosis present

## 2014-05-27 NOTE — Therapy (Signed)
[facility] [place] [address] [place], [place], [address] Phone: [phone]   Fax:  [phone]  Physical Therapy Evaluation  Patient Details  Name: Paige [name] MRN: [id number] Date of Birth: [birth year] Referring Provider:  Gildardo Cranker, MD  Encounter Date: 05/27/2014      PT End of Session - 05/27/14 1041    Visit Number 1   Number of Visits 16   Date for PT Re-Evaluation 07/27/14   PT Start Time 1004   PT Stop Time 1100   PT Time Calculation (min) 56 min      Past Medical History  Diagnosis Date  . Arthritis   . Depression   . Hypertension     not being treated  . Restless legs syndrome   . Lymphedema of both lower extremities   . GERD (gastroesophageal reflux disease)   . H/O hiatal hernia   . Anemia   . PONV (postoperative nausea and vomiting)     after Cholecysectomy    Past Surgical History  Procedure Laterality Date  . Cholecystectomy    . Abdominal hysterectomy    . Eye surgery Bilateral     cataracts  . Fracture surgery Right     arm age 69  . Fracture surgery Bilateral     both thumbs  . Colonoscopy    . 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 Laparoscopic gastric banding      2010  . Total knee arthroplasty Right 02/10/2014    dr Turner Daniels  . Total knee arthroplasty Right 02/10/2014    Procedure: TOTAL KNEE ARTHROPLASTY;  Surgeon: Nestor Lewandowsky, MD;  [facility]: [facility] OR;  Service: Orthopedics;  Laterality: Right;    There were no vitals filed for this visit.  Visit Diagnosis:  Knee stiffness, right - Plan: PT plan of care cert/re-cert  Right knee pain - Plan: PT plan of care cert/re-cert  Midline low back pain without sciatica - Plan: PT plan of care cert/re-cert      Subjective Assessment - 05/27/14 1018    Subjective Had right TKR in January 2016, had PT  here and did great, WE finished care for that about 4-6 weeks ago.  She reports that she slipped in the tub 2 weeks ago and twisted her right knee and back.     Pertinent History left TKR September 2015, right TKR january 2016   How long can you sit comfortably? any sitting is difficult   Patient Stated Goals have no pain and be able to walk   Currently in Pain? Yes   Pain Score 2    Pain Location Knee  some back pain   Pain Orientation Right   Pain Descriptors / Indicators Aching   Pain Type Acute pain   Pain Onset 1 to 4 weeks ago   Pain Frequency Intermittent   Aggravating Factors  sitting   Pain Relieving Factors movement            [facility] PT Assessment - 05/27/14 0001    Assessment   Medical Diagnosis right knee and back pain   Onset Date 05/13/14   Prior Therapy prior PT for right TKR   Balance Screen   Has the patient fallen in the past 6 months Yes   How many times? 1   Has the patient had a  decrease in activity level because of a fear of falling?  No   Is the patient reluctant to leave their home because of a fear of falling?  No   Home Environment   Additional Comments no stairs, lives alone   Prior Function   Level of Independence Independent with basic ADLs;Independent with homemaking with ambulation   ROM / Strength   AROM / PROM / Strength --  AROM of the lumbar spine was decreased 50% for side bend   AROM   Right/Left Knee Right   Right Knee Extension 0   Right Knee Flexion 95   Palpation   Palpation very tender to the right quad, superior patella, ITB and into the calf, she is very tender in the L/S area and into the mid thoracic area                   [facility] Adult PT Treatment/Exercise - Jun 19, 2014 0001    Electrical Stimulation   Electrical Stimulation Location low back    Electrical Stimulation Parameters IFC   Electrical Stimulation Goals Pain                  PT Short Term Goals - 2014-06-19 1045    PT SHORT TERM GOAL #1    Title independent with HEP   Time 1   Period Weeks   Status New           PT Long Term Goals - 2014/06/19 1045    PT LONG TERM GOAL #1   Title independent with RICE   Time 8   Period Weeks   Status New   PT LONG TERM GOAL #2   Title increase AROM of the right knee to 0-110 degrees flexion   Time 8   Period Weeks   Status New   PT LONG TERM GOAL #3   Title walk to shop without difficulty of pain   Time 8   Period Weeks   Status New   PT LONG TERM GOAL #4   Title decrease pain 50%   Time 8   Period Weeks   Status New               Plan - Jun 19, 2014 1042    Clinical Impression Statement Patient with a right TKR in January, had PT here and did great, finished with PT in mid March.  Reports that about 2 weeks ago she was getting out of the bathtub and slipped, reports she twisted the right knee and the low back.  She reports difficulty walking after sitting and has a significant decrease in flexion from when we discharged her.          Pt will benefit from skilled therapeutic intervention in order to improve on the following deficits Abnormal gait;Difficulty walking;Decreased strength;Decreased range of motion;Decreased endurance;Increased edema   Rehab Potential Good   PT Frequency 2x / week   PT Duration 8 weeks   PT Treatment/Interventions Electrical Stimulation;Ultrasound;Therapeutic activities;Balance training;Gait training;Therapeutic exercise;Patient/family education   PT Next Visit Plan get her moving again   Consulted and Agree with Plan of Care Patient          G-Codes - 2014-06-19 1046    Functional Assessment Tool Used FOTO   Functional Limitation Mobility: Walking and moving around   Mobility: Walking and Moving Around Current Status (Z6109) At least 60 percent but less than 80 percent impaired, limited or restricted   Mobility: Walking and Moving Around Goal Status [phone])  At least 40 percent but less than 60 percent impaired, limited or restricted        Problem List Patient Active Problem List   Diagnosis Date Noted  . Arthritis of right knee 02/10/2014  . Primary osteoarthritis of right knee 02/09/2014  . Arthritis of knee, left 09/25/2013  . Arthritis of knee 09/25/2013    Jearld Lesch, PT 05/27/2014, 10:48 AM  [facility] [place] [address] [place], [place], [address] Phone: [phone]   Fax:  [phone]

## 2014-05-29 ENCOUNTER — Ambulatory Visit: Payer: Medicare Other | Admitting: Physical Therapy

## 2014-05-29 ENCOUNTER — Encounter: Payer: Self-pay | Admitting: Physical Therapy

## 2014-05-29 DIAGNOSIS — M545 Low back pain, unspecified: Secondary | ICD-10-CM

## 2014-05-29 DIAGNOSIS — M25661 Stiffness of right knee, not elsewhere classified: Secondary | ICD-10-CM | POA: Diagnosis not present

## 2014-05-29 DIAGNOSIS — M25561 Pain in right knee: Secondary | ICD-10-CM

## 2014-05-29 NOTE — Therapy (Signed)
[facility] [place] [address] [place], [place], [address] Phone: [phone]   Fax:  [phone]  Physical Therapy Treatment  Patient Details  Name: Paige [name] MRN: [id number] Date of Birth: 06-Apr-1945 Referring Provider:  Gildardo Cranker, MD  Encounter Date: 05/29/2014      PT End of Session - 05/29/14 1120    Visit Number 2   PT Start Time 1025   PT Stop Time 1125   PT Time Calculation (min) 60 min      Past Medical History  Diagnosis Date  . Arthritis   . Depression   . Hypertension     not being treated  . Restless legs syndrome   . Lymphedema of both lower extremities   . GERD (gastroesophageal reflux disease)   . H/O hiatal hernia   . Anemia   . PONV (postoperative nausea and vomiting)     after Cholecysectomy    Past Surgical History  Procedure Laterality Date  . Cholecystectomy    . Abdominal hysterectomy    . Eye surgery Bilateral     cataracts  . Fracture surgery Right     arm age 66  . Fracture surgery Bilateral     both thumbs  . Colonoscopy    . 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 Laparoscopic gastric banding      2010  . Total knee arthroplasty Right 02/10/2014    dr Turner Daniels  . Total knee arthroplasty Right 02/10/2014    Procedure: TOTAL KNEE ARTHROPLASTY;  Surgeon: Nestor Lewandowsky, MD;  [facility]: [facility] OR;  Service: Orthopedics;  Laterality: Right;    There were no vitals filed for this visit.  Visit Diagnosis:  Right knee pain  Midline low back pain without sciatica      Subjective Assessment - 05/29/14 1030    Subjective Everything feels stiff when I wake up in the morning.  Right now I am not having any pain.  Just the legs are feeling stiff.  No pain in the back.     Currently in Pain? No/denies   Pain Score 0-No pain                          [facility] Adult PT Treatment/Exercise - 05/29/14 0001    Knee/Hip Exercises: Aerobic   Isokinetic NuStep L5 7 min   Knee/Hip Exercises: Machines for Strengthening   Cybex Knee Extension 10# 3x10   Cybex Knee Flexion 25# 3x10   Knee/Hip Exercises: Plyometrics   Other Plyometric Exercises seated row 15# 3x10: lat pull down 15# 3x10   Other Plyometric Exercises HHA on airex hip flex, ext, abd, x10 with red theraband unsteady with extension on airex; switched to the steady ground  bilaterally    Moist Heat Therapy   Number Minutes Moist Heat 15 Minutes   Moist Heat Location --  Back; R knee   Electrical Stimulation   Electrical Stimulation Location low back    Electrical Stimulation Parameters IFC   Electrical Stimulation Goals Pain                  PT Short Term Goals - 05/27/14 1045    PT SHORT TERM GOAL #1   Title independent with HEP   Time 1   Period Weeks   Status  New           PT Long Term Goals - 05/27/14 1045    PT LONG TERM GOAL #1   Title independent with RICE   Time 8   Period Weeks   Status New   PT LONG TERM GOAL #2   Title increase AROM of the right knee to 0-110 degrees flexion   Time 8   Period Weeks   Status New   PT LONG TERM GOAL #3   Title walk to shop without difficulty of pain   Time 8   Period Weeks   Status New   PT LONG TERM GOAL #4   Title decrease pain 50%   Time 8   Period Weeks   Status New               Plan - 05/29/14 1134    Clinical Impression Statement pt. had increased difficulty with balance exercises.  pt. has decreased endurance with activity, needed rest breaks between exercises.  pt. would benefit from PT to maximize function.     PT Next Visit Plan add exercises that work on balance and overall strengthening         Problem List Patient Active Problem List   Diagnosis Date Noted  . Arthritis of right knee 02/10/2014  . Primary osteoarthritis of right knee  02/09/2014  . Arthritis of knee, left 09/25/2013  . Arthritis of knee 09/25/2013   Payseur, Karoline Caldwell, PTA Salli Real, SPTA 05/29/2014, 11:44 AM  [facility] [place] [address] [place], [place], [address] Phone: [phone]   Fax:  [phone]

## 2014-06-03 ENCOUNTER — Ambulatory Visit: Payer: Medicare Other | Admitting: Physical Therapy

## 2014-06-03 ENCOUNTER — Encounter: Payer: Self-pay | Admitting: Physical Therapy

## 2014-06-03 DIAGNOSIS — M545 Low back pain, unspecified: Secondary | ICD-10-CM

## 2014-06-03 DIAGNOSIS — M25661 Stiffness of right knee, not elsewhere classified: Secondary | ICD-10-CM | POA: Diagnosis not present

## 2014-06-03 DIAGNOSIS — M7989 Other specified soft tissue disorders: Secondary | ICD-10-CM

## 2014-06-03 DIAGNOSIS — M25561 Pain in right knee: Secondary | ICD-10-CM

## 2014-06-03 NOTE — Therapy (Signed)
[facility] [place] [address] [place], [place], [address] Phone: [phone]   Fax:  [phone]  Physical Therapy Treatment  Patient Details  Name: Paige [name] MRN: [id number] Date of Birth: Apr 22, 1945 Referring Provider:  Gildardo Cranker, MD  Encounter Date: 06/03/2014      PT End of Session - 06/03/14 1403    Visit Number 3   Date for PT Re-Evaluation 07/27/14   PT Start Time 1310   PT Stop Time 1425   PT Time Calculation (min) 75 min   Activity Tolerance Patient tolerated treatment well      Past Medical History  Diagnosis Date  . Arthritis   . Depression   . Hypertension     not being treated  . Restless legs syndrome   . Lymphedema of both lower extremities   . GERD (gastroesophageal reflux disease)   . H/O hiatal hernia   . Anemia   . PONV (postoperative nausea and vomiting)     after Cholecysectomy    Past Surgical History  Procedure Laterality Date  . Cholecystectomy    . Abdominal hysterectomy    . Eye surgery Bilateral     cataracts  . Fracture surgery Right     arm age 77  . Fracture surgery Bilateral     both thumbs  . Colonoscopy    . 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 Laparoscopic gastric banding      2010  . Total knee arthroplasty Right 02/10/2014    dr Turner Daniels  . Total knee arthroplasty Right 02/10/2014    Procedure: TOTAL KNEE ARTHROPLASTY;  Surgeon: Nestor Lewandowsky, MD;  [facility]: [facility] OR;  Service: Orthopedics;  Laterality: Right;    There were no vitals filed for this visit.  Visit Diagnosis:  Right knee pain  Midline low back pain without sciatica  Knee stiffness, right  Swelling of limb      Subjective Assessment - 06/03/14 1312    Subjective Knee is just really stiff, that fall really set me back some   Currently in Pain? Yes   Pain  Score 2    Pain Location Knee   Pain Orientation Right   Pain Descriptors / Indicators Tightness   Pain Type Acute pain   Pain Onset 1 to 4 weeks ago   Pain Frequency Intermittent   Aggravating Factors  sitting for a while, a lot of standing   Pain Relieving Factors rest and easy movement                         [facility] Adult PT Treatment/Exercise - 06/03/14 0001    Ambulation/Gait   Gait Comments gait outside 700 feet had her negotiate curbs several times, with some HHA and verbal cues   Knee/Hip Exercises: Aerobic   Stationary Bike 6 minutes   Isokinetic NuStep L5 7 min   Knee/Hip Exercises: Machines for Strengthening   Cybex Knee Extension 10# 3x10   Cybex Knee Flexion 35# 3x10   Cybex Leg Press 20# and no weight   Cryotherapy   Number Minutes Cryotherapy 15 Minutes   Cryotherapy Location Knee   Type of Cryotherapy Ice pack   Electrical Stimulation   Electrical Stimulation Location right knee   Electrical Stimulation Parameters IFC   Electrical Stimulation Goals Pain  Manual Therapy   Manual Therapy Passive ROM   Passive ROM right knee flexion                  PT Short Term Goals - 05/27/14 1045    PT SHORT TERM GOAL #1   Title independent with HEP   Time 1   Period Weeks   Status New           PT Long Term Goals - 05/27/14 1045    PT LONG TERM GOAL #1   Title independent with RICE   Time 8   Period Weeks   Status New   PT LONG TERM GOAL #2   Title increase AROM of the right knee to 0-110 degrees flexion   Time 8   Period Weeks   Status New   PT LONG TERM GOAL #3   Title walk to shop without difficulty of pain   Time 8   Period Weeks   Status New   PT LONG TERM GOAL #4   Title decrease pain 50%   Time 8   Period Weeks   Status New               Plan - 06/03/14 1403    Clinical Impression Statement Has difficulty with curbs, she is unsteady with this, short of breath with gait around the building   PT Next  Visit Plan add exercises that work on balance and overall strengthening    Consulted and Agree with Plan of Care Patient        Problem List Patient Active Problem List   Diagnosis Date Noted  . Arthritis of right knee 02/10/2014  . Primary osteoarthritis of right knee 02/09/2014  . Arthritis of knee, left 09/25/2013  . Arthritis of knee 09/25/2013    Jearld Lesch, PT 06/03/2014, 2:07 PM  [facility] [place] [address] [place], [place], [address] Phone: [phone]   Fax:  [phone]

## 2014-06-05 ENCOUNTER — Ambulatory Visit: Payer: Medicare Other | Admitting: Physical Therapy

## 2014-06-05 ENCOUNTER — Encounter: Payer: Self-pay | Admitting: Physical Therapy

## 2014-06-05 DIAGNOSIS — M25661 Stiffness of right knee, not elsewhere classified: Secondary | ICD-10-CM

## 2014-06-05 DIAGNOSIS — M25561 Pain in right knee: Secondary | ICD-10-CM

## 2014-06-05 NOTE — Therapy (Signed)
[facility] [place] [address] [place], [place], [address] Phone: [phone]   Fax:  [phone]  Physical Therapy Treatment  Patient Details  Name: Paige PLAISTED MRN: [id number] Date of Birth: 06-Mar-1945 Referring Provider:  Gildardo Cranker, MD  Encounter Date: 06/05/2014      PT End of Session - 06/05/14 1217    Visit Number 4   Number of Visits 16   Date for PT Re-Evaluation 07/27/14   PT Start Time 1149   PT Stop Time 1245   PT Time Calculation (min) 56 min      Past Medical History  Diagnosis Date  . Arthritis   . Depression   . Hypertension     not being treated  . Restless legs syndrome   . Lymphedema of both lower extremities   . GERD (gastroesophageal reflux disease)   . H/O hiatal hernia   . Anemia   . PONV (postoperative nausea and vomiting)     after Cholecysectomy    Past Surgical History  Procedure Laterality Date  . Cholecystectomy    . Abdominal hysterectomy    . Eye surgery Bilateral     cataracts  . Fracture surgery Right     arm age 21  . Fracture surgery Bilateral     both thumbs  . Colonoscopy    . 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 Laparoscopic gastric banding      2010  . Total knee arthroplasty Right 02/10/2014    dr Turner Daniels  . Total knee arthroplasty Right 02/10/2014    Procedure: TOTAL KNEE ARTHROPLASTY;  Surgeon: Nestor Lewandowsky, MD;  [facility];  Service: Orthopedics;  Laterality: Right;    There were no vitals filed for this visit.  Visit Diagnosis:  Right knee pain  Knee stiffness, right      Subjective Assessment - 06/05/14 1153    Subjective trouble getting in and out of car   Currently in Pain? Yes   Pain Score 2                          [facility] Adult PT Treatment/Exercise - 06/05/14 0001    Knee/Hip  Exercises: Aerobic   Stationary Bike 6 minutes   Elliptical Nustep L 5 6 min   Knee/Hip Exercises: Machines for Strengthening   Cybex Knee Extension 10# 3x10   Cybex Knee Flexion 35# 3x10   Cybex Leg Press 20# and no weight   Knee/Hip Exercises: Standing   Other Standing Knee Exercises 20# pulley lumbar/scap stab ext and retraction   Other Standing Knee Exercises red tband hip ext and abd 15 times bilaterally   Modalities   Modalities Cryotherapy;Electrical Stimulation   Cryotherapy   Number Minutes Cryotherapy 15 Minutes   Cryotherapy Location Knee   Type of Cryotherapy Ice pack   Electrical Stimulation   Electrical Stimulation Location right knee  ant and post knee   Electrical Stimulation Parameters premod   Electrical Stimulation Goals Pain                  PT Short Term Goals - 06/05/14 1214    PT SHORT TERM GOAL #1   Title independent with HEP   Status Achieved           PT Long  Term Goals - 06/05/14 1214    PT LONG TERM GOAL #1   Title independent with RICE   Status Achieved   PT LONG TERM GOAL #2   Title increase AROM of the right knee to 0-110 degrees flexion   Status On-going   PT LONG TERM GOAL #3   Title walk to shop without difficulty of pain   Status On-going   PT LONG TERM GOAL #4   Title decrease pain 50%   Status On-going               Plan - 06/05/14 1215    Clinical Impression Statement pt has resumed HEP from prior sessions and easing into stretches and ther ex, progressing with goals. tolerated session well with some increased spasms in LEs        Problem List Patient Active Problem List   Diagnosis Date Noted  . Arthritis of right knee 02/10/2014  . Primary osteoarthritis of right knee 02/09/2014  . Arthritis of knee, left 09/25/2013  . Arthritis of knee 09/25/2013    Lesleyann Fichter,ANGIE PTA 06/05/2014, 12:22 PM  [facility] [place] [address] [place],  [place], [address] Phone: [phone]   Fax:  [phone]

## 2014-06-09 ENCOUNTER — Ambulatory Visit: Payer: Medicare Other | Admitting: Physical Therapy

## 2014-06-09 DIAGNOSIS — M25661 Stiffness of right knee, not elsewhere classified: Secondary | ICD-10-CM | POA: Diagnosis not present

## 2014-06-09 DIAGNOSIS — M545 Low back pain, unspecified: Secondary | ICD-10-CM

## 2014-06-09 DIAGNOSIS — M7989 Other specified soft tissue disorders: Secondary | ICD-10-CM

## 2014-06-09 DIAGNOSIS — M25561 Pain in right knee: Secondary | ICD-10-CM

## 2014-06-09 NOTE — Therapy (Signed)
[facility] [place] [address] [place], [place], [address] Phone: [phone]   Fax:  [phone]  Physical Therapy Treatment  Patient Details  Name: Paige [name] MRN: [id number] Date of Birth: [birth year] Referring Provider:  Gildardo Cranker, MD  Encounter Date: 06/09/2014      PT End of Session - 06/09/14 1108    Visit Number 5   Number of Visits 16   Date for PT Re-Evaluation 07/27/14   PT Start Time 1108   PT Stop Time 1203   PT Time Calculation (min) 55 min   Activity Tolerance Patient tolerated treatment well   Behavior During Therapy [facility] for tasks assessed/performed      Past Medical History  Diagnosis Date  . Arthritis   . Depression   . Hypertension     not being treated  . Restless legs syndrome   . Lymphedema of both lower extremities   . GERD (gastroesophageal reflux disease)   . H/O hiatal hernia   . Anemia   . PONV (postoperative nausea and vomiting)     after Cholecysectomy    Past Surgical History  Procedure Laterality Date  . Cholecystectomy    . Abdominal hysterectomy    . Eye surgery Bilateral     cataracts  . Fracture surgery Right     arm age 34  . Fracture surgery Bilateral     both thumbs  . Colonoscopy    . 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 Laparoscopic gastric banding      2010  . Total knee arthroplasty Right 02/10/2014    dr Turner Daniels  . Total knee arthroplasty Right 02/10/2014    Procedure: TOTAL KNEE ARTHROPLASTY;  Surgeon: Nestor Lewandowsky, MD;  [facility];  Service: Orthopedics;  Laterality: Right;    There were no vitals filed for this visit.  Visit Diagnosis:  Right knee pain  Knee stiffness, right  Midline low back pain without sciatica  Swelling of limb      Subjective Assessment - 06/09/14 1111    Subjective just soreness  today   Currently in Pain? No/denies  just soreness                         [facility] Adult PT Treatment/Exercise - 06/09/14 0001    Knee/Hip Exercises: Stretches   Gastroc Stretch 1 rep;30 seconds   Knee/Hip Exercises: Aerobic   Stationary Bike 6 minutes   Elliptical Nustep L 5 6 min   Knee/Hip Exercises: Machines for Strengthening   Cybex Knee Extension 15# 3x10   Cybex Knee Flexion 35# 3x10   Cybex Leg Press 20# 1x10; at 5 then 1x10 at 4; then 30# 1x10 at 4   Knee/Hip Exercises: Standing   Other Standing Knee Exercises 30# pulley lumbar/scap stab retraction and 20# ext 3x10   Other Standing Knee Exercises red tband hip ext and abd 15 times bilaterally   Modalities   Modalities Cryotherapy;Electrical Stimulation   Cryotherapy   Number Minutes Cryotherapy 15 Minutes   Cryotherapy Location Knee   Type of Cryotherapy Ice pack   Electrical Stimulation   Electrical Stimulation Location Rt knee   Electrical Stimulation Parameters IFC   Electrical Stimulation Goals Pain  PT Short Term Goals - 06/05/14 1214    PT SHORT TERM GOAL #1   Title independent with HEP   Status Achieved           PT Long Term Goals - 06/05/14 1214    PT LONG TERM GOAL #1   Title independent with RICE   Status Achieved   PT LONG TERM GOAL #2   Title increase AROM of the right knee to 0-110 degrees flexion   Status On-going   PT LONG TERM GOAL #3   Title walk to shop without difficulty of pain   Status On-going   PT LONG TERM GOAL #4   Title decrease pain 50%   Status On-going               Plan - 06/09/14 1157    Clinical Impression Statement Pt tolerated increased resistance with exercises today. Still complains of right calf tenderness and responded well to gastroc stretch.   PT Next Visit Plan continue exercises that work on balance and overall strengthening        Problem List Patient Active Problem List   Diagnosis Date Noted  .  Arthritis of right knee 02/10/2014  . Primary osteoarthritis of right knee 02/09/2014  . Arthritis of knee, left 09/25/2013  . Arthritis of knee 09/25/2013    Solon Palm PT  06/09/2014, 12:11 PM  [facility] [place] [address] [place], [place], [address] Phone: [phone]   Fax:  [phone]

## 2014-06-12 ENCOUNTER — Ambulatory Visit: Payer: Medicare Other | Admitting: Physical Therapy

## 2014-06-12 DIAGNOSIS — M25661 Stiffness of right knee, not elsewhere classified: Secondary | ICD-10-CM

## 2014-06-12 DIAGNOSIS — M25561 Pain in right knee: Secondary | ICD-10-CM

## 2014-06-12 NOTE — Therapy (Signed)
[facility] [place], [place], [address] Phone: [phone]   Fax:  [phone]  Physical Therapy Treatment  Patient Details  Name: Paige [name] MRN: [id number] Date of Birth: Jul 16, 1945 Referring Provider:  Lona Kettle, MD  Encounter Date: 06/12/2014      PT End of Session - 06/12/14 1102    Visit Number 6   PT Start Time 2353   PT Stop Time 1148   PT Time Calculation (min) 46 min   Activity Tolerance Patient tolerated treatment well   Behavior During Therapy [facility] for tasks assessed/performed      Past Medical History  Diagnosis Date  . Arthritis   . Depression   . Hypertension     not being treated  . Restless legs syndrome   . Lymphedema of both lower extremities   . GERD (gastroesophageal reflux disease)   . H/O hiatal hernia   . Anemia   . PONV (postoperative nausea and vomiting)     after Cholecysectomy    Past Surgical History  Procedure Laterality Date  . Cholecystectomy    . Abdominal hysterectomy    . Eye surgery Bilateral     cataracts  . Fracture surgery Right     arm age 69  . Fracture surgery Bilateral     both thumbs  . Colonoscopy    . Total knee arthroplasty Left 09/25/2013    Procedure: TOTAL KNEE ARTHROPLASTY;  Surgeon: Kerin Salen, MD;  Location: [place];  Service: Orthopedics;  Laterality: Left;  . Laparoscopic gastric banding      2010  . Total knee arthroplasty Right 02/10/2014    dr Mayer Camel  . Total knee arthroplasty Right 02/10/2014    Procedure: TOTAL KNEE ARTHROPLASTY;  Surgeon: Kerin Salen, MD;  Location: [place];  Service: Orthopedics;  Laterality: Right;    There were no vitals filed for this visit.  Visit Diagnosis:  Right knee pain  Knee stiffness, right      Subjective Assessment - 06/12/14 1103    Subjective I woke with pain in both my arms today.  She reports getting out of the car without holding on to anything today.   Currently in Pain?  No/denies            [facility] PT Assessment - 06/12/14 0001    AROM   Right Knee Extension 0   Right Knee Flexion 108                     [facility] Adult PT Treatment/Exercise - 06/12/14 0001    Knee/Hip Exercises: Stretches   Quad Stretch 2 reps;30 seconds  with strap   Gastroc Stretch 1 rep;30 seconds  bil   Knee/Hip Exercises: Aerobic   Stationary Bike 6 minutes   Elliptical Nustep L 5 6 min  seat 9   Knee/Hip Exercises: Standing   Forward Lunges 2 sets;5 reps   Lateral Step Up 1 set;10 reps;Hand Hold: 1  bil   Forward Step Up 2 sets;5 reps  bil   SLS bil with one UE support to 2 fingers multiple trials                  PT Short Term Goals - 06/05/14 1214    PT SHORT TERM GOAL #1   Title independent with HEP   Status Achieved           PT Long Term Goals - 06/12/14  [place] #1   Title independent with RICE   Time 8   Period Weeks   Status Achieved   PT LONG TERM GOAL #2   Title increase AROM of the right knee to 0-110 degrees flexion   Period Weeks   Status On-going   PT LONG TERM GOAL #3   Title walk to shop without difficulty of pain   Time 8   Period Weeks   Status Achieved   PT LONG TERM GOAL #4   Title decrease pain 50%   Time 8   Period Weeks   Status Achieved               Plan - 06/12/14 1155    Clinical Impression Statement Patient has met pain and walking endurance goals. Close to meeting knee flexion goal. She still demonstrates functional weakness with steps and balance deficits as well.   PT Next Visit Plan continue steps, balance and overall strengthening   Consulted and Agree with Plan of Care Patient        Problem List Patient Active Problem List   Diagnosis Date Noted  . Arthritis of right knee 02/10/2014  . Primary osteoarthritis of right knee 02/09/2014  . Arthritis of knee, left 09/25/2013  . Arthritis of knee 09/25/2013    Madelyn Flavors PT  06/12/2014, 12:01 PM  [address] [place], [place], [address] Phone: [phone]   Fax:  [phone]

## 2014-06-12 NOTE — Patient Instructions (Signed)
ANKLE EDEMA: Quadriceps Stretch (Side-Lying)   Bend knee and reach back with and using a strap around the ankle or foot. Hold position for 30 seconds. Repeat _3 times. Repeat on other side. Do __2_ times per day.  Copyright  VHI. All rights reserved.  Solon Palm, PT 06/12/2014 11:46 AM [facility]

## 2014-06-16 ENCOUNTER — Ambulatory Visit: Payer: Medicare Other | Admitting: Physical Therapy

## 2014-06-16 DIAGNOSIS — M25661 Stiffness of right knee, not elsewhere classified: Secondary | ICD-10-CM | POA: Diagnosis not present

## 2014-06-16 DIAGNOSIS — M7989 Other specified soft tissue disorders: Secondary | ICD-10-CM

## 2014-06-16 DIAGNOSIS — M25561 Pain in right knee: Secondary | ICD-10-CM

## 2014-06-16 NOTE — Therapy (Signed)
[facility] [place] [facility] [address] [place], [place], [address] Phone: [phone]   Fax:  [phone]  Physical Therapy Treatment  Patient Details  Name: Paige [name] MRN: [id number] Date of Birth: [birth year] Referring Provider:  Gildardo Cranker, MD  Encounter Date: 06/16/2014      PT End of Session - 06/16/14 1044    Visit Number 8   Number of Visits 16      Past Medical History  Diagnosis Date  . Arthritis   . Depression   . Hypertension     not being treated  . Restless legs syndrome   . Lymphedema of both lower extremities   . GERD (gastroesophageal reflux disease)   . H/O hiatal hernia   . Anemia   . PONV (postoperative nausea and vomiting)     after Cholecysectomy    Past Surgical History  Procedure Laterality Date  . Cholecystectomy    . Abdominal hysterectomy    . Eye surgery Bilateral     cataracts  . Fracture surgery Right     arm age 38  . Fracture surgery Bilateral     both thumbs  . Colonoscopy    . 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 Laparoscopic gastric banding      2010  . Total knee arthroplasty Right 02/10/2014    dr Turner Daniels  . Total knee arthroplasty Right 02/10/2014    Procedure: TOTAL KNEE ARTHROPLASTY;  Surgeon: Nestor Lewandowsky, MD;  [facility];  Service: Orthopedics;  Laterality: Right;    There were no vitals filed for this visit.  Visit Diagnosis:  Right knee pain  Knee stiffness, right  Swelling of limb      Subjective Assessment - 06/16/14 1045    Subjective doing much better and may cancel apt for next visit.    Pertinent History left TKR September 2015, right TKR january 2016   Limitations Standing;Walking   How long can you sit comfortably? any sitting is difficult   Currently in Pain? Yes   Pain Score 2    Pain Location Knee   Pain Orientation Right   Pain Onset 1 to 4 weeks ago                         [facility] Adult PT Treatment/Exercise - 06/16/14 0001    Knee/Hip Exercises: Stretches   Active Hamstring Stretch 2 reps;30 seconds  with strap bilatera   Piriformis Stretch 2 reps;30 seconds  bilateral   Gastroc Stretch 2 reps;30 seconds  bil, with knees in alignment/behind toes   Knee/Hip Exercises: Aerobic   Stationary Bike 6 minutes   Manual Therapy   Manual Therapy Myofascial release;Soft tissue mobilization  bilateral in prone for release                  PT Short Term Goals - 06/05/14 1214    PT SHORT TERM GOAL #1   Title independent with HEP   Status Achieved           PT Long Term Goals - 06/12/14 1119    PT LONG TERM GOAL #1   Title independent with RICE   Time 8   Period Weeks   Status Achieved   PT LONG TERM GOAL #2   Title increase AROM of  the right knee to 0-110 degrees flexion   Period Weeks   Status On-going   PT LONG TERM GOAL #3   Title walk to shop without difficulty of pain   Time 8   Period Weeks   Status Achieved   PT LONG TERM GOAL #4   Title decrease pain 50%   Time 8   Period Weeks   Status Achieved               Plan - 06/16/14 1058    Clinical Impression Statement patient did well today with manual therapy on calves.    Rehab Potential Good   PT Frequency 2x / week   PT Duration 8 weeks   PT Treatment/Interventions Electrical Stimulation;Ultrasound;Therapeutic activities;Balance training;Gait training;Therapeutic exercise;Patient/family education   PT Next Visit Plan balance, calves stw, overall strenghtening, gait training   Consulted and Agree with Plan of Care Patient        Problem List Patient Active Problem List   Diagnosis Date Noted  . Arthritis of right knee 02/10/2014  . Primary osteoarthritis of right knee 02/09/2014  . Arthritis of knee, left 09/25/2013  . Arthritis of knee 09/25/2013     Lady Deutscher, PTA  06/16/2014, 11:00 AM  [facility] [place] [address] [place], [place], [address] Phone: [phone]   Fax:  [phone]

## 2014-06-19 ENCOUNTER — Ambulatory Visit: Payer: Medicare Other | Admitting: Physical Therapy

## 2014-06-19 DIAGNOSIS — M25661 Stiffness of right knee, not elsewhere classified: Secondary | ICD-10-CM | POA: Diagnosis not present

## 2014-06-19 NOTE — Therapy (Signed)
[facility] [place] [address] [place], [place], [address] Phone: [phone]   Fax:  [phone]  Physical Therapy Treatment  Patient Details  Name: Paige [name] MRN: [id number] Date of Birth: [birth year] Referring Provider:  Gildardo Cranker, MD  Encounter Date: 06/19/2014      PT End of Session - 06/19/14 1442    Visit Number 9   Number of Visits 16   Date for PT Re-Evaluation 07/27/14   PT Start Time 1405   PT Stop Time 1446   PT Time Calculation (min) 41 min   Activity Tolerance Patient tolerated treatment well   Behavior During Therapy [facility] for tasks assessed/performed      Past Medical History  Diagnosis Date  . Arthritis   . Depression   . Hypertension     not being treated  . Restless legs syndrome   . Lymphedema of both lower extremities   . GERD (gastroesophageal reflux disease)   . H/O hiatal hernia   . Anemia   . PONV (postoperative nausea and vomiting)     after Cholecysectomy    Past Surgical History  Procedure Laterality Date  . Cholecystectomy    . Abdominal hysterectomy    . Eye surgery Bilateral     cataracts  . Fracture surgery Right     arm age 58  . Fracture surgery Bilateral     both thumbs  . Colonoscopy    . 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 Laparoscopic gastric banding      2010  . Total knee arthroplasty Right 02/10/2014    dr Turner Daniels  . Total knee arthroplasty Right 02/10/2014    Procedure: TOTAL KNEE ARTHROPLASTY;  Surgeon: Nestor Lewandowsky, MD;  [facility];  Service: Orthopedics;  Laterality: Right;    There were no vitals filed for this visit.  Visit Diagnosis:  Knee stiffness, right      Subjective Assessment - 06/19/14 1401    Subjective performing runners stretch at kitchen sink with electric toothbrush at 30 second  intervals.Calves feel much better. Can get in and out of car much easier now! She is working on hip rotation  with arm swings   Pertinent History left TKR September 2015, right TKR january 2016   Currently in Pain? No/denies   Aggravating Factors  none   Multiple Pain Sites No            [facility] PT Assessment - 06/19/14 0001    ROM / Strength   AROM / PROM / Strength Strength   AROM   Right Knee Extension 0   Right Knee Flexion 121   Strength   Strength Assessment Site Knee   Right/Left Knee Right   Right Knee Flexion 5/5   Right Knee Extension 5/5                     [facility] Adult PT Treatment/Exercise - 06/19/14 0001    Knee/Hip Exercises: Stretches   Quad Stretch 5 reps;30 seconds  foot on 8" stool UE support   Piriformis Stretch 2 reps;30 seconds  bil   Knee/Hip Exercises: Aerobic   Stationary Bike 6 minutes   Elliptical NuStep L 6 X 6    Knee/Hip Exercises: Supine   Bridges Both;10 reps;2 sets   Manual Therapy   Manual  Therapy Joint mobilization  bil calves in prone with foam roller and palm   Joint Mobilization knee flexion Grd 3 & 4  prone with M. belt and hooklying with PTA support in 90/90   Soft tissue mobilization bil calves with foam roller & palms   Myofascial Release bil calves                  PT Short Term Goals - 06/19/14 1445    PT SHORT TERM GOAL #1   Title independent with HEP   Baseline doing HEP for home PT           PT Long Term Goals - 06/19/14 1445    PT LONG TERM GOAL #2   Title increase AROM of the right knee to 0-110 degrees flexion   Time 8   Period Weeks   Status Achieved   PT LONG TERM GOAL #4   Title decrease pain 50%   Time 8   Period Weeks   Status Achieved               Plan - 06/19/14 1443    Clinical Impression Statement Patient wants to come 1 time per week due to financial concerns. She is doing significantly better with ROM    Pt will benefit from skilled therapeutic intervention  in order to improve on the following deficits Abnormal gait;Difficulty walking;Decreased strength;Decreased range of motion;Decreased endurance;Increased edema   Rehab Potential Good   PT Frequency 1x / week   PT Duration 8 weeks   PT Next Visit Plan Balance, strength, calves STW PRN, Patient will start 1Xweek due to financial concerns   Consulted and Agree with Plan of Care Patient        Problem List Patient Active Problem List   Diagnosis Date Noted  . Arthritis of right knee 02/10/2014  . Primary osteoarthritis of right knee 02/09/2014  . Arthritis of knee, left 09/25/2013  . Arthritis of knee 09/25/2013     Lady Deutscher, PTA  06/19/2014, 3:10 PM  [facility] [place] [address] [place], [place], [address] Phone: [phone]   Fax:  [phone]

## 2014-06-26 ENCOUNTER — Encounter: Payer: Self-pay | Admitting: Physical Therapy

## 2014-06-26 ENCOUNTER — Ambulatory Visit: Payer: Medicare Other | Attending: Orthopedic Surgery | Admitting: Physical Therapy

## 2014-06-26 DIAGNOSIS — M25561 Pain in right knee: Secondary | ICD-10-CM | POA: Diagnosis present

## 2014-06-26 DIAGNOSIS — G729 Myopathy, unspecified: Secondary | ICD-10-CM | POA: Diagnosis present

## 2014-06-26 DIAGNOSIS — M25661 Stiffness of right knee, not elsewhere classified: Secondary | ICD-10-CM | POA: Diagnosis not present

## 2014-06-26 DIAGNOSIS — M79606 Pain in leg, unspecified: Secondary | ICD-10-CM | POA: Insufficient documentation

## 2014-06-26 NOTE — Therapy (Addendum)
[facility] [place], [place], [address] Phone: [phone]   Fax:  [phone]  Physical Therapy Treatment  Patient Details  Name: Paige [name] MRN: [id number] Date of Birth: September 30, 1945 Referring Provider:  Lona Kettle, MD  Encounter Date: 06/26/2014      PT End of Session - 06/26/14 1600    Visit Number 10   Number of Visits 16   Date for PT Re-Evaluation 07/27/14   PT Start Time 0109   PT Stop Time 1630   PT Time Calculation (min) 60 min      Past Medical History  Diagnosis Date  . Arthritis   . Depression   . Hypertension     not being treated  . Restless legs syndrome   . Lymphedema of both lower extremities   . GERD (gastroesophageal reflux disease)   . H/O hiatal hernia   . Anemia   . PONV (postoperative nausea and vomiting)     after Cholecysectomy    Past Surgical History  Procedure Laterality Date  . Cholecystectomy    . Abdominal hysterectomy    . Eye surgery Bilateral     cataracts  . Fracture surgery Right     arm age 35  . Fracture surgery Bilateral     both thumbs  . Colonoscopy    . Total knee arthroplasty Left 09/25/2013    Procedure: TOTAL KNEE ARTHROPLASTY;  Surgeon: Kerin Salen, MD;  Location: [facility];  Service: Orthopedics;  Laterality: Left;  . Laparoscopic gastric banding      2010  . Total knee arthroplasty Right 02/10/2014    dr Mayer Camel  . Total knee arthroplasty Right 02/10/2014    Procedure: TOTAL KNEE ARTHROPLASTY;  Surgeon: Kerin Salen, MD;  Location: [facility];  Service: Orthopedics;  Laterality: Right;    There were no vitals filed for this visit.  Visit Diagnosis:  Knee stiffness, right  Right knee pain      Subjective Assessment - 06/26/14 1532    Subjective 60% better, stretches are really decreasing cramps, biggest complaint is stiffness   Currently in Pain? Yes   Pain Score 2    Pain Location Knee            [facility] PT Assessment -  06/26/14 0001    AROM   AROM Assessment Site Knee   Right/Left Knee Right   Right Knee Extension 0   Right Knee Flexion 122                     [facility] Adult PT Treatment/Exercise - 06/26/14 0001    Knee/Hip Exercises: Aerobic   Stationary Bike 6 minutes   Elliptical NuStep L 6 X 6    Knee/Hip Exercises: Machines for Strengthening   Cybex Knee Extension 15# 3x10   Cybex Knee Flexion 35# 3x10   Cybex Leg Press 20# 1x10; at 5 then 1x10 at 4; then 30# 1x10 at 4   Knee/Hip Exercises: Standing   Hip ADduction Strengthening;Right;Left;1 set;10 reps  with 6 inch lateral step up   Forward Step Up Right;Left;1 set;10 reps  6 inch with opp hip ext   Step Down Right;Left;1 set;5 sets  4 inch, attempted 6 inch Left 1 time RT 3 times                  PT Short Term Goals - 06/19/14 1445    PT SHORT TERM GOAL #1  Title independent with HEP   Baseline doing HEP for home PT           PT Long Term Goals - 07-25-14 1558    PT LONG TERM GOAL #1   Title independent with RICE   Status Achieved   PT LONG TERM GOAL #2   Title increase AROM of the right knee to 0-110 degrees flexion   Status Achieved   PT LONG TERM GOAL #3   Title walk to shop without difficulty of pain   Status Achieved   PT LONG TERM GOAL #4   Title decrease pain 50%   Status Achieved               Plan - 2014/07/25 1605    Clinical Impression Statement all goals met. discharge to independant gym program   PT Next Visit Plan D/C           G-Codes - 2014-07-25 1715    Functional Assessment Tool Used FOTO   Functional Limitation Mobility: Walking and moving around   Mobility: Walking and Moving Around Current Status [phone]) At least 40 percent but less than 60 percent impaired, limited or restricted   Mobility: Walking and Moving Around Goal Status [phone]) At least 40 percent but less than 60 percent impaired, limited or restricted   Mobility: Walking and Moving Around Discharge Status  [phone]) At least 40 percent but less than 60 percent impaired, limited or restricted      Problem List Patient Active Problem List   Diagnosis Date Noted  . Arthritis of right knee 02/10/2014  . Primary osteoarthritis of right knee 02/09/2014  . Arthritis of knee, left 09/25/2013  . Arthritis of knee 09/25/2013    Lum Babe W PTA Lum Babe, PT 07/25/14, 5:16 PM  [facility] [address] [place], [place], [address] Phone: [phone]   Fax:  [phone]

## 2014-07-03 ENCOUNTER — Ambulatory Visit: Payer: Medicare Other | Admitting: Physical Therapy

## 2014-07-14 ENCOUNTER — Ambulatory Visit: Payer: Medicare Other | Admitting: Physical Therapy

## 2014-07-14 DIAGNOSIS — M25661 Stiffness of right knee, not elsewhere classified: Secondary | ICD-10-CM | POA: Diagnosis not present

## 2014-07-14 DIAGNOSIS — M6289 Other specified disorders of muscle: Secondary | ICD-10-CM

## 2014-07-14 DIAGNOSIS — M79606 Pain in leg, unspecified: Secondary | ICD-10-CM

## 2014-07-14 NOTE — Therapy (Signed)
[facility] [address] [place], [place], [address] Phone: [phone]   Fax:  [phone]  Physical Therapy Evaluation  Patient Details  Name: Paige [name] MRN: [id number] Date of Birth: 1945/09/18 Referring Provider:  Gildardo Cranker, MD  Encounter Date: 07/14/2014      PT End of Session - 07/14/14 1356    Visit Number 1   Number of Visits 6   Date for PT Re-Evaluation 08/25/14   PT Start Time 1320   PT Stop Time 1355   PT Time Calculation (min) 35 min   Activity Tolerance Patient tolerated treatment well   Behavior During Therapy [facility] for tasks assessed/performed      Past Medical History  Diagnosis Date  . Arthritis   . Depression   . Hypertension     not being treated  . Restless legs syndrome   . Lymphedema of both lower extremities   . GERD (gastroesophageal reflux disease)   . H/O hiatal hernia   . Anemia   . PONV (postoperative nausea and vomiting)     after Cholecysectomy    Past Surgical History  Procedure Laterality Date  . Cholecystectomy    . Abdominal hysterectomy    . Eye surgery Bilateral     cataracts  . Fracture surgery Right     arm age 60  . Fracture surgery Bilateral     both thumbs  . Colonoscopy    . 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 Laparoscopic gastric banding      2010  . Total knee arthroplasty Right 02/10/2014    dr Turner Daniels  . Total knee arthroplasty Right 02/10/2014    Procedure: TOTAL KNEE ARTHROPLASTY;  Surgeon: Nestor Lewandowsky, MD;  [facility];  Service: Orthopedics;  Laterality: Right;    There were no vitals filed for this visit.  Visit Diagnosis:  Pain of lower extremity, unspecified laterality - Plan: PT plan of care cert/re-cert  Muscle tightness - Plan: PT plan of care cert/re-cert      Subjective Assessment -  07/14/14 1322    Subjective Pt is a 69 y/o female who returns to OPPT with bil calf and ITB pain.  Pt recently d/c'ed from this clinic on 06/26/14 following TKA.  Pt reports symptoms ongoing for months and reports increased difficutly with walking and negotiating stairs.   Pertinent History left TKR September 2015, right TKR january 2016   How long can you walk comfortably? 10 min   Patient Stated Goals decrease pain; negotiate stairs   Currently in Pain? Yes   Pain Score 0-No pain  no pain at rest   Pain Location Leg   Pain Orientation Left;Right  L>R   Pain Descriptors / Indicators Tightness;Stabbing   Pain Type Chronic pain   Pain Onset More than a month ago   Pain Frequency Intermittent   Aggravating Factors  increases to 8/10 with twisting movements   Pain Relieving Factors rest and immobility            [facility] PT Assessment - 07/14/14 1326    Assessment   Medical Diagnosis bil calf and ITB pain   Onset Date/Surgical Date --  "months"   Next MD Visit 3 months   Prior Therapy recently d/c'ed from this clinic 06/26/14   Precautions   Precautions None  Restrictions   Weight Bearing Restrictions No   Balance Screen   Has the patient fallen in the past 6 months Yes   How many times? 1   Has the patient had a decrease in activity level because of a fear of falling?  Yes   Is the patient reluctant to leave their home because of a fear of falling?  Yes   Prior Function   Level of Independence Independent   Vocation Retired   Leisure none   Strength   Right Knee Flexion 5/5   Right Knee Extension 5/5   Flexibility   Soft Tissue Assessment /Muscle Length yes   Hamstrings tightness bil   Quadriceps tightness bil   ITB tightness bil with tenderness along L greater trochanter   Palpation   Palpation comment tightness as well in bil gastrocs                           PT Education - 05-Aug-2014 1356    Education provided Yes   Education Details HEP, POC;  goals of care   Person(s) Educated Patient   Methods Explanation;Demonstration;Handout   Comprehension Verbalized understanding;Returned demonstration;Need further instruction          PT Short Term Goals - Aug 05, 2014 1358    PT SHORT TERM GOAL #1   Title n/a           PT Long Term Goals - 2014-08-05 1358    PT LONG TERM GOAL #1   Title independent with HEP (08/25/14)   Time 6   Period Weeks   Status New   PT LONG TERM GOAL #2   Title report pain < 4/10 with activity for improved function and mobility (08/25/14)   Time 6   Period Weeks   Status New   PT LONG TERM GOAL #3   Title n/a   PT LONG TERM GOAL #4   Title n/a               Plan - 08-05-14 1356    Clinical Impression Statement Pt returns to OPPT with persistent pain and tightness in bil lower extremities.  Pt demonstrates tigness in virtually all lower extremity muscle groups.  Will benefit from PT to maximize function.   Pt will benefit from skilled therapeutic intervention in order to improve on the following deficits Impaired flexibility;Pain   Rehab Potential Good   PT Frequency 1x / week   PT Duration 6 weeks   PT Treatment/Interventions Electrical Stimulation;Ultrasound;Therapeutic activities;Balance training;Gait training;Therapeutic exercise;Patient/family education;Iontophoresis 4mg /ml Dexamethasone;Cryotherapy;Functional mobility training;ADLs/Self Care Home Management;Manual techniques;Passive range of motion   PT Next Visit Plan review HEP; add additional stretches   Consulted and Agree with Plan of Care Patient          G-Codes - 08/05/2014 1402    Functional Assessment Tool Used no HEP for stretching   Functional Limitation Self care   Self Care Current Status (G3151) At least 20 percent but less than 40 percent impaired, limited or restricted   Self Care Goal Status (V6160) At least 1 percent but less than 20 percent impaired, limited or restricted       Problem List Patient Active  Problem List   Diagnosis Date Noted  . Arthritis of right knee 02/10/2014  . Primary osteoarthritis of right knee 02/09/2014  . Arthritis of knee, left 09/25/2013  . Arthritis of knee 09/25/2013   Clarita Crane, PT, DPT August 05, 2014 2:04 PM  [facility]  [place], [place], [address] Phone: [phone]   Fax:  [phone]

## 2014-07-14 NOTE — Patient Instructions (Signed)
Hip Flexor Stretch   Lying on back near edge of bed, bend one leg, foot flat. Hang other leg over edge, relaxed, thigh resting entirely on bed for __30__ seconds. Repeat _2-3___ times on each leg.  http://gt2.exer.us/347   Copyright  VHI. All rights reserved.   Chair Sitting   Sit at edge of seat, spine straight, one leg extended. Put a hand on each thigh and bend forward from the hip, keeping spine straight. Allow hand on extended leg to reach toward toes. Support upper body with other arm. Hold __30_ seconds. Repeat _2-3__ times per session on each leg.  Do _1-2__ sessions per day.  Copyright  VHI. All rights reserved.   Gastroc / Heel Cord Stretch - Seated With Towel   Sit on floor, towel around ball of foot. Gently pull foot in toward body, stretching heel cord and calf. Hold for _30__ seconds. Repeat on other leg. Repeat _2-3__ times. Do _1-2__ times per day.  Copyright  VHI. All rights reserved.     [facility] [address] [place], [place] [address]  [phone] (office) [phone] (fax)

## 2014-07-21 ENCOUNTER — Other Ambulatory Visit: Payer: Self-pay

## 2014-07-23 ENCOUNTER — Encounter: Payer: Self-pay | Admitting: Physical Therapy

## 2014-07-23 ENCOUNTER — Ambulatory Visit: Payer: Medicare Other | Admitting: Physical Therapy

## 2014-07-23 DIAGNOSIS — M25661 Stiffness of right knee, not elsewhere classified: Secondary | ICD-10-CM | POA: Diagnosis not present

## 2014-07-23 DIAGNOSIS — M6289 Other specified disorders of muscle: Secondary | ICD-10-CM

## 2014-07-23 DIAGNOSIS — M79606 Pain in leg, unspecified: Secondary | ICD-10-CM

## 2014-07-23 NOTE — Therapy (Signed)
[facility] [address] [place], [place], [address] Phone: [phone]   Fax:  [phone]  Physical Therapy Treatment  Patient Details  Name: Paige [name] MRN: [id number] Date of Birth: 07-Mar-1945 Referring Provider:  Gildardo Cranker, MD  Encounter Date: 07/23/2014      PT End of Session - 07/23/14 1135    Visit Number 2   Number of Visits 6   Date for PT Re-Evaluation 08/25/14   PT Start Time 1140      Past Medical History  Diagnosis Date  . Arthritis   . Depression   . Hypertension     not being treated  . Restless legs syndrome   . Lymphedema of both lower extremities   . GERD (gastroesophageal reflux disease)   . H/O hiatal hernia   . Anemia   . PONV (postoperative nausea and vomiting)     after Cholecysectomy    Past Surgical History  Procedure Laterality Date  . Cholecystectomy    . Abdominal hysterectomy    . Eye surgery Bilateral     cataracts  . Fracture surgery Right     arm age 69  . Fracture surgery Bilateral     both thumbs  . Colonoscopy    . 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 Laparoscopic gastric banding      2010  . Total knee arthroplasty Right 02/10/2014    dr Turner Daniels  . Total knee arthroplasty Right 02/10/2014    Procedure: TOTAL KNEE ARTHROPLASTY;  Surgeon: Nestor Lewandowsky, MD;  [facility];  Service: Orthopedics;  Laterality: Right;    There were no vitals filed for this visit.  Visit Diagnosis:  Pain of lower extremity, unspecified laterality  Muscle tightness      Subjective Assessment - 07/23/14 1101    Subjective just not getting any better, calves,IT and back   Currently in Pain? Yes   Pain Score 4    Pain Location Leg   Pain Orientation Right;Left                         [facility] Adult PT Treatment/Exercise -  07/23/14 0001    Knee/Hip Exercises: Aerobic   Elliptical NuStep L 6 X 6    Manual Therapy   Manual Therapy Soft tissue mobilization;Myofascial release;Passive ROM  foam rolling   Manual therapy comments bilateral calves,HS, gluts,ITB and quads                  PT Short Term Goals - 07/14/14 1358    PT SHORT TERM GOAL #1   Title n/a           PT Long Term Goals - 07/14/14 1358    PT LONG TERM GOAL #1   Title independent with HEP (08/25/14)   Time 6   Period Weeks   Status New   PT LONG TERM GOAL #2   Title report pain < 4/10 with activity for improved function and mobility (08/25/14)   Time 6   Period Weeks   Status New   PT LONG TERM GOAL #3   Title n/a   PT LONG TERM GOAL #4   Title n/a               Plan - 07/23/14 1135  Clinical Impression Statement pt has been an on again off again pt her for LE weakness and tightness, aggressive MT today with STW, stretches and foam rolling, pt reports looses after, pt was very tight bilateral LE with trigger pts thru out   PT Next Visit Plan Discussed Dry Needling and pt will try next session        Problem List Patient Active Problem List   Diagnosis Date Noted  . Arthritis of right knee 02/10/2014  . Primary osteoarthritis of right knee 02/09/2014  . Arthritis of knee, left 09/25/2013  . Arthritis of knee 09/25/2013    Azrielle Springsteen,ANGIE PTA 07/23/2014, 11:37 AM  [facility] [address] [place], [place], [address] Phone: [phone]   Fax:  [phone]

## 2014-07-30 ENCOUNTER — Ambulatory Visit: Payer: Medicare Other | Admitting: Rehabilitation

## 2014-07-31 ENCOUNTER — Ambulatory Visit: Payer: Medicare Other | Attending: Orthopedic Surgery | Admitting: Physical Therapy

## 2014-07-31 DIAGNOSIS — M79606 Pain in leg, unspecified: Secondary | ICD-10-CM | POA: Insufficient documentation

## 2014-07-31 DIAGNOSIS — M7989 Other specified soft tissue disorders: Secondary | ICD-10-CM | POA: Diagnosis present

## 2014-07-31 DIAGNOSIS — M6289 Other specified disorders of muscle: Secondary | ICD-10-CM

## 2014-07-31 DIAGNOSIS — M545 Low back pain, unspecified: Secondary | ICD-10-CM

## 2014-07-31 DIAGNOSIS — M25661 Stiffness of right knee, not elsewhere classified: Secondary | ICD-10-CM | POA: Insufficient documentation

## 2014-07-31 DIAGNOSIS — M25561 Pain in right knee: Secondary | ICD-10-CM | POA: Insufficient documentation

## 2014-07-31 DIAGNOSIS — G729 Myopathy, unspecified: Secondary | ICD-10-CM | POA: Insufficient documentation

## 2014-07-31 NOTE — Therapy (Signed)
[facility] [address] [place], [place], [address] Phone: [phone]   Fax:  [phone]  Physical Therapy Treatment  Patient Details  Name: Paige [name] MRN: [id number] Date of Birth: [birth year] Referring Provider:  Gildardo Cranker, MD  Encounter Date: 07/31/2014      PT End of Session - 07/31/14 1259    Visit Number 3   Number of Visits 6   Date for PT Re-Evaluation 08/25/14   PT Start Time 1115   PT Stop Time 1200   PT Time Calculation (min) 45 min   Activity Tolerance Patient tolerated treatment well   Behavior During Therapy [facility] for tasks assessed/performed      Past Medical History  Diagnosis Date  . Arthritis   . Depression   . Hypertension     not being treated  . Restless legs syndrome   . Lymphedema of both lower extremities   . GERD (gastroesophageal reflux disease)   . H/O hiatal hernia   . Anemia   . PONV (postoperative nausea and vomiting)     after Cholecysectomy    Past Surgical History  Procedure Laterality Date  . Cholecystectomy    . Abdominal hysterectomy    . Eye surgery Bilateral     cataracts  . Fracture surgery Right     arm age 54  . Fracture surgery Bilateral     both thumbs  . Colonoscopy    . 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 Laparoscopic gastric banding      2010  . Total knee arthroplasty Right 02/10/2014    dr Turner Daniels  . Total knee arthroplasty Right 02/10/2014    Procedure: TOTAL KNEE ARTHROPLASTY;  Surgeon: Nestor Lewandowsky, MD;  [facility];  Service: Orthopedics;  Laterality: Right;    There were no vitals filed for this visit.  Visit Diagnosis:  Pain of lower extremity, unspecified laterality  Muscle tightness  Knee stiffness, right  Right knee pain  Swelling of limb  Midline low back pain without sciatica       Subjective Assessment - 07/31/14 1117    Subjective Pain on bilateral lateral thighs and bilateral calf muscles but left greater pain than right   Pertinent History left TKR September 2015, right TKR january 2016   Limitations Standing;Walking   How long can you sit comfortably? any sitting is difficult   Currently in Pain? Yes   Pain Score 8   0 at rest   Pain Location Calf   Pain Orientation Right;Left  Left > pain than Right   Pain Descriptors / Indicators Tightness;Sore;Stabbing                         [facility] Adult PT Treatment/Exercise - 07/31/14 1123    Knee/Hip Exercises: Stretches   Lobbyist 2 reps;Left;Right;30 seconds   Gastroc Stretch Left;Right;2 reps;30 seconds   Manual Therapy   Manual Therapy Soft tissue mobilization;Myofascial release;Passive ROM  foam rolling   Manual therapy comments bilateral calves,   Myofascial Release bil calves with IASTYM tool          Trigger Point Dry Needling - 07/31/14 1123    Consent Given? Yes   Education Handout Provided Yes   Gastrocnemius Response Twitch response elicited;Palpable increased muscle length  bilaterally  PT Education - 07/31/14 1119    Education provided Yes   Education Details [place] and aftercare for trigger point dry needling, reviewed Gastroc, Quad stretch   Person(s) Educated Patient   Methods Explanation;Demonstration;Verbal cues   Comprehension Verbalized understanding;Returned demonstration          PT Short Term Goals - 07/14/14 1358    PT SHORT TERM GOAL #1   Title n/a           PT Long Term Goals - 07/31/14 1304    PT LONG TERM GOAL #1   Title independent with HEP (08/25/14)   Time 6   Period Weeks   Status On-going   PT LONG TERM GOAL #2   Title report pain < 4/10 with activity for improved function and mobility (08/25/14)   Time 6   Period Weeks   Status On-going               Plan - 07/31/14 1144    Clinical Impression  Statement Pt arrives 15 min late for appt.  and recieves phone calls during liimted treatment time. Pt very distracted during treatment session.  Pt initiated treatment for trigger point dry needling to bil calves in prone as well as IASTYM to bil calves in prone and heat following.  Pt with gastroc stretch with decreased pain post trigger point dry needling as per pt.   Pt will benefit from skilled therapeutic intervention in order to improve on the following deficits Impaired flexibility;Pain   Rehab Potential Good   PT Frequency 1x / week   PT Duration 6 weeks   PT Treatment/Interventions Electrical Stimulation;Ultrasound;Therapeutic activities;Balance training;Gait training;Therapeutic exercise;Patient/family education;Iontophoresis 4mg /ml Dexamethasone;Cryotherapy;Functional mobility training;ADLs/Self Care Home Management;Manual techniques;Passive range of motion   PT Next Visit Plan Assess trigger point dry needling and progress with quads next treatment   Consulted and Agree with Plan of Care Patient        Problem List Patient Active Problem List   Diagnosis Date Noted  . Arthritis of right knee 02/10/2014  . Primary osteoarthritis of right knee 02/09/2014  . Arthritis of knee, left 09/25/2013  . Arthritis of knee 09/25/2013   Garen Lah, PT 07/31/2014 1:05 PM Phone: [phone] Fax: [phone]  [facility] [address] [place], [place], [address] Phone: [phone]   Fax:  [phone]

## 2014-07-31 NOTE — Patient Instructions (Addendum)
Trigger Point Dry Needling  . What is Trigger Point Dry Needling (DN)? o DN is a physical therapy technique used to treat muscle pain and dysfunction. Specifically, DN helps deactivate muscle trigger points (muscle knots).  o A thin filiform needle is used to penetrate the skin and stimulate the underlying trigger point. The goal is for a local twitch response (LTR) to occur and for the trigger point to relax. No medication of any kind is injected during the procedure.   . What Does Trigger Point Dry Needling Feel Like?  o The procedure feels different for each individual patient. Some patients report that they do not actually feel the needle enter the skin and overall the process is not painful. Very mild bleeding may occur. However, many patients feel a deep cramping in the muscle in which the needle was inserted. This is the local twitch response.   Marland Kitchen How Will I feel after the treatment? o Soreness is normal, and the onset of soreness may not occur for a few hours. Typically this soreness does not last longer than two days.  o Bruising is uncommon, however; ice can be used to decrease any possible bruising.  o In rare cases feeling tired or nauseous after the treatment is normal. In addition, your symptoms may get worse before they get better, this period will typically not last longer than 24 hours.   . What Can I do After My Treatment? o Increase your hydration by drinking more water for the next 24 hours. o You may place ice or heat on the areas treated that have become sore, however, do not use heat on inflamed or bruised areas. Heat often brings more relief post needling. o You can continue your regular activities, but vigorous activity is not recommended initially after the treatment for 24 hours. o DN is best combined with other physical therapy such as strengthening, stretching, and other therapies.  Garen Lah, PT 07/31/2014 11:19 AM Phone: [phone] Fax: [phone]

## 2014-08-15 ENCOUNTER — Ambulatory Visit: Payer: Medicare Other | Admitting: Physical Therapy

## 2014-08-15 DIAGNOSIS — M25561 Pain in right knee: Secondary | ICD-10-CM

## 2014-08-15 DIAGNOSIS — M79606 Pain in leg, unspecified: Secondary | ICD-10-CM

## 2014-08-15 DIAGNOSIS — M6289 Other specified disorders of muscle: Secondary | ICD-10-CM

## 2014-08-15 DIAGNOSIS — M25661 Stiffness of right knee, not elsewhere classified: Secondary | ICD-10-CM

## 2014-08-15 NOTE — Patient Instructions (Signed)
Keep doing stretches and strengthening. Heat off/on for 24 hours

## 2014-08-15 NOTE — Therapy (Signed)
[facility] [address] [place], [place], [address] Phone: [phone]   Fax:  [phone]  Physical Therapy Treatment  Patient Details  Name: Paige [name] MRN: [id number] Date of Birth: May 06, 1945 Referring Provider:  Gildardo Cranker, MD  Encounter Date: 08/15/2014      PT End of Session - 08/15/14 1054    Visit Number 5   Number of Visits 6   Date for PT Re-Evaluation 08/25/14   PT Start Time 0845   PT Stop Time 0943   PT Time Calculation (min) 58 min   Activity Tolerance Patient tolerated treatment well      Past Medical History  Diagnosis Date  . Arthritis   . Depression   . Hypertension     not being treated  . Restless legs syndrome   . Lymphedema of both lower extremities   . GERD (gastroesophageal reflux disease)   . H/O hiatal hernia   . Anemia   . PONV (postoperative nausea and vomiting)     after Cholecysectomy    Past Surgical History  Procedure Laterality Date  . Cholecystectomy    . Abdominal hysterectomy    . Eye surgery Bilateral     cataracts  . Fracture surgery Right     arm age 69  . Fracture surgery Bilateral     both thumbs  . Colonoscopy    . 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 Laparoscopic gastric banding      2010  . Total knee arthroplasty Right 02/10/2014    dr Turner Daniels  . Total knee arthroplasty Right 02/10/2014    Procedure: TOTAL KNEE ARTHROPLASTY;  Surgeon: Nestor Lewandowsky, MD;  Location: [facility] OR;  Service: Orthopedics;  Laterality: Right;    There were no vitals filed for this visit.  Visit Diagnosis:  Pain of lower extremity, unspecified laterality  Muscle tightness  Knee stiffness, right  Right knee pain      Subjective Assessment - 08/15/14 0810    Subjective Ortho doc gave her injections in her knees.  Good relief from dry needling  in calves which helped with tightness.  My PCP says I might have fibromyalgia.  Primary complaint of left groin pain and behind the left knee.  I'm concerned about the weakness in the groin area.     Currently in Pain? Yes   Pain Score 8    Pain Location Hip   Pain Orientation Left   Aggravating Factors  getting up and down on it   Pain Relieving Factors sitting, not moving                         [facility] Adult PT Treatment/Exercise - 08/15/14 0855    Moist Heat Therapy   Number Minutes Moist Heat 15 Minutes   Moist Heat Location Knee   Manual Therapy   Joint Mobilization hip long axis distraction above knee 1x grade 2   Soft tissue mobilization left quads pre and post needling   Passive ROM hip scour with pain at 10:00; pain with hip abd with pain produced          Trigger Point Dry Needling - 08/15/14 0857    Consent Given? Yes   Muscles Treated Lower Body Quadriceps   Quadriceps Response Twitch response elicited;Palpable increased muscle length  rectus femoris,  vastus lateralis, vastus medialis      Left side only        PT Education - 08/15/14 0918    Education provided Yes   Education Details self care following dry needling   Person(s) Educated Patient   Methods Explanation   Comprehension Verbalized understanding          PT Short Term Goals - 07/14/14 1358    PT SHORT TERM GOAL #1   Title n/a           PT Long Term Goals - 08/15/14 1059    PT LONG TERM GOAL #1   Title independent with HEP (08/25/14)   Time 6   Period Weeks   Status On-going   PT LONG TERM GOAL #2   Title report pain < 4/10 with activity for improved function and mobility (08/25/14)   Time 6   Period Weeks   Status On-going               Plan - 08/15/14 1054    Clinical Impression Statement The patient has multiple painful tender points in proximal and mid rectus femoris, vastus lateralis and vastus medialis.  She has improved muscle length following dry  needling and manual techniques.  Therapist closely monitoring response throughout treatment session.  Progress may be slowed secondary to co-morbidities including lymphedema.   PT Next Visit Plan Assess trigger point dry needling and continue as needed; hip joint mobs and hip/knee PROM;  LEs on ball ROM and bridge;  check for change in lymphedema        Problem List Patient Active Problem List   Diagnosis Date Noted  . Arthritis of right knee 02/10/2014  . Primary osteoarthritis of right knee 02/09/2014  . Arthritis of knee, left 09/25/2013  . Arthritis of knee 09/25/2013    Vivien Presto 08/15/2014, 11:00 AM  [facility] [address] [place], [place], [address] Phone: [phone]   Fax:  [phone]   Lavinia Sharps, PT 08/15/2014 11:01 AM Phone: [phone] Fax: [phone]

## 2014-08-19 ENCOUNTER — Ambulatory Visit: Payer: Medicare Other | Admitting: Physical Therapy

## 2014-08-19 DIAGNOSIS — M79606 Pain in leg, unspecified: Secondary | ICD-10-CM

## 2014-08-19 DIAGNOSIS — M7989 Other specified soft tissue disorders: Secondary | ICD-10-CM

## 2014-08-19 DIAGNOSIS — M545 Low back pain, unspecified: Secondary | ICD-10-CM

## 2014-08-19 DIAGNOSIS — M25661 Stiffness of right knee, not elsewhere classified: Secondary | ICD-10-CM

## 2014-08-19 DIAGNOSIS — M25561 Pain in right knee: Secondary | ICD-10-CM

## 2014-08-19 DIAGNOSIS — M6289 Other specified disorders of muscle: Secondary | ICD-10-CM

## 2014-08-19 NOTE — Therapy (Signed)
[facility] [address] [place], [place], [address] Phone: [phone]   Fax:  [phone]  Physical Therapy Treatment  Patient Details  Name: Paige [name] MRN: [id number] Date of Birth: 04/06/1945 Referring Provider:  Gildardo Cranker, MD  Encounter Date: 08/19/2014      PT End of Session - 08/19/14 1801    Visit Number 6   Number of Visits 8   Date for PT Re-Evaluation 08/25/14   PT Start Time 1330   PT Stop Time 1430   PT Time Calculation (min) 60 min   Activity Tolerance Patient tolerated treatment well      Past Medical History  Diagnosis Date  . Arthritis   . Depression   . Hypertension     not being treated  . Restless legs syndrome   . Lymphedema of both lower extremities   . GERD (gastroesophageal reflux disease)   . H/O hiatal hernia   . Anemia   . PONV (postoperative nausea and vomiting)     after Cholecysectomy    Past Surgical History  Procedure Laterality Date  . Cholecystectomy    . Abdominal hysterectomy    . Eye surgery Bilateral     cataracts  . Fracture surgery Right     arm age 27  . Fracture surgery Bilateral     both thumbs  . Colonoscopy    . 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 Laparoscopic gastric banding      2010  . Total knee arthroplasty Right 02/10/2014    dr Turner Daniels  . Total knee arthroplasty Right 02/10/2014    Procedure: TOTAL KNEE ARTHROPLASTY;  Surgeon: Nestor Lewandowsky, MD;  [facility];  Service: Orthopedics;  Laterality: Right;    There were no vitals filed for this visit.  Visit Diagnosis:  Pain of lower extremity, unspecified laterality  Muscle tightness  Knee stiffness, right  Right knee pain  Swelling of limb  Midline low back pain without sciatica      Subjective Assessment - 08/19/14 1334    Subjective I was really  sore after needling last time.  Calf and posterior knee as well as top of thigh still hurting.  LB acting up too.     Currently in Pain? Yes   Pain Score 5    Pain Orientation Left   Pain Type Chronic pain   Pain Onset More than a month ago   Aggravating Factors  getting in/out of the car;  rising after sitting for a long time;  going down stairs   Pain Relieving Factors sitting                          [facility] Adult PT Treatment/Exercise - 08/19/14 0001    Knee/Hip Exercises: Supine   Bridges Strengthening;Both;1 set;10 reps   Other Supine Knee/Hip Exercises Hip flexion with LEs on ball and frog leg 2x8   Other Supine Knee/Hip Exercises lumbar rotation R/L 8x each   Moist Heat Therapy   Number Minutes Moist Heat 15 Minutes   Moist Heat Location Knee  gastroc, HS, quads   Manual Therapy   Joint Mobilization hip long axis distraction above knee 1x grade 2; inferior glides grade 2 3x 30 sec   Soft tissue mobilization left gastroc, vastus lateralis and lateral HS pre and  post needling   Myofascial Release piriformis contract-relax 3x 5 sec holds          Trigger Point Dry Needling - 08/19/14 1800    Consent Given? Yes   Muscles Treated Lower Body Hamstring;Gastrocnemius;Tensor fascia lata   Tensor Fascia Lata Response Twitch response elicited;Palpable increased muscle length   Hamstring Response Twitch response elicited;Palpable increased muscle length   Gastrocnemius Response Twitch response elicited;Palpable increased muscle length    Left only            PT Short Term Goals - 07/14/14 1358    PT SHORT TERM GOAL #1   Title n/a           PT Long Term Goals - 08/19/14 1805    PT LONG TERM GOAL #1   Title independent with HEP (08/25/14)   Time 6   Period Weeks   Status On-going   PT LONG TERM GOAL #2   Title report pain < 4/10 with activity for improved function and mobility (08/25/14)   Time 6   Period Weeks   Status On-going                Plan - 08/19/14 1802    Clinical Impression Statement Multiple tender points in gastroc, HS and TFL.  Moderate lymphedema persists.  She reports "it doesn't hurt with some of the usual ways" following treatment session.  Therapist closely monitoring response and limited needling performed secondary to sensitivity.  Check progress toward goals next visit.     PT Next Visit Plan Check progress toward goals, recert or discharge.  hip mobs, dry needling, ball mobility and strengthening        Problem List Patient Active Problem List   Diagnosis Date Noted  . Arthritis of right knee 02/10/2014  . Primary osteoarthritis of right knee 02/09/2014  . Arthritis of knee, left 09/25/2013  . Arthritis of knee 09/25/2013    Vivien Presto 08/19/2014, 6:10 PM  [facility] [address] [place], [place], [address] Phone: [phone]   Fax:  [phone]   Lavinia Sharps, PT 08/19/2014 6:11 PM Phone: [phone] Fax: [phone]

## 2014-08-21 ENCOUNTER — Ambulatory Visit: Payer: Medicare Other | Admitting: Physical Therapy

## 2014-08-21 DIAGNOSIS — M6289 Other specified disorders of muscle: Secondary | ICD-10-CM

## 2014-08-21 DIAGNOSIS — M79606 Pain in leg, unspecified: Secondary | ICD-10-CM | POA: Diagnosis not present

## 2014-08-21 NOTE — Therapy (Signed)
[place], [place], [address] Phone: [phone]   Fax:  [phone]  Physical Therapy Treatment/Discharge summary  Patient Details  Name: Paige [name] MRN: [id number] Date of Birth: 01-21-1946 Referring Provider:  Lona Kettle, MD  Encounter Date: 08/21/2014      PT End of Session - 08/21/14 1550    Visit Number 7   Number of Visits 8   Date for PT Re-Evaluation 08/25/14   PT Start Time 5993   PT Stop Time 1515   PT Time Calculation (min) 60 min   Activity Tolerance Patient tolerated treatment well      Past Medical History  Diagnosis Date  . Arthritis   . Depression   . Hypertension     not being treated  . Restless legs syndrome   . Lymphedema of both lower extremities   . GERD (gastroesophageal reflux disease)   . H/O hiatal hernia   . Anemia   . PONV (postoperative nausea and vomiting)     after Cholecysectomy    Past Surgical History  Procedure Laterality Date  . Cholecystectomy    . Abdominal hysterectomy    . Eye surgery Bilateral     cataracts  . Fracture surgery Right     arm age 53  . Fracture surgery Bilateral     both thumbs  . Colonoscopy    . Total knee arthroplasty Left 09/25/2013    Procedure: TOTAL KNEE ARTHROPLASTY;  Surgeon: Kerin Salen, MD;  Location: [facility];  Service: Orthopedics;  Laterality: Left;  . Laparoscopic gastric banding      2010  . Total knee arthroplasty Right 02/10/2014    dr Mayer Camel  . Total knee arthroplasty Right 02/10/2014    Procedure: TOTAL KNEE ARTHROPLASTY;  Surgeon: Kerin Salen, MD;  Location: [facility];  Service: Orthopedics;  Laterality: Right;    There were no vitals filed for this visit.  Visit Diagnosis:  Pain of lower extremity, unspecified laterality  Muscle tightness      Subjective Assessment - 08/21/14 1428    Subjective Still hurting in calf, posterior knee and anterior lower leg.  Patient notes no long lasting  improvement in pain from dry needling.  She feels better immediately after but symptoms return.     Currently in Pain? Yes   Pain Score 1   with movement 4 or 5/10, stabbing pain 8/10 at times getting in bed   Pain Orientation Left   Aggravating Factors  getting in/out of bed ; standing/walking a while   Pain Relieving Factors sitting            [facility] PT Assessment - 08/21/14 1439    AROM   Right Knee Extension 0   Right Knee Flexion 125   Left Knee Extension 0   Left Knee Flexion 125   Strength   Right Knee Flexion 5/5   Right Knee Extension 5/5   Flexibility   Hamstrings --  85 left; 80 right   Palpation   Palpation comment --  multiple tender points left gastroc,HS, VL muscles                     [facility] Adult PT Treatment/Exercise - 08/21/14 0001    Moist Heat Therapy   Number Minutes Moist Heat 10 Minutes   Moist Heat Location Knee   Manual Therapy   Manual Therapy Joint mobilization;Soft tissue mobilization;Passive ROM   Joint Mobilization inferior and distraction  hip grade 2 3x 20 sec   Soft tissue mobilization left gastroc, HS, vastus lateralis   Passive ROM gastroc and HS stretch          Trigger Point Dry Needling - 09-20-2014 1549    Consent Given? Yes   Muscles Treated Lower Body Quadriceps;Hamstring;Gastrocnemius   Quadriceps Response Twitch response elicited;Palpable increased muscle length   Hamstring Response Twitch response elicited;Palpable increased muscle length   Gastrocnemius Response Twitch response elicited;Palpable increased muscle length      Left only          PT Short Term Goals - 07/14/14 1358    PT SHORT TERM GOAL #1   Title n/a           PT Long Term Goals - 09-20-2014 1449    PT LONG TERM GOAL #1   Title independent with HEP (08/25/14)   Status Achieved   PT LONG TERM GOAL #2   Title report pain < 4/10 with activity for improved function and mobility (08/25/14)   Status Partially Met                Plan - 2014-09-20 1550    Clinical Impression Statement The patient reports following treatment session with dry needling and manual techniques, "I can walk better and it's not hurting."  However, she reports the relief is temporary.  At rest her pain is 1/10 but can increase to 8/10 suddenly with getting in/out of the car or bed.  Her pain with standing/walking is generally 3-4/10.   She continues to have numerous tender points in left gastroc, lateral HS and vastus lateralis muscles.  She is quite sensitive to needling but immediately after she feels better and is ambulating with improved speed and stability.  She reports indepedence with her comprehensive HEP as previously instructed at the [facility] facility and at the [facility] location.  We discussed her progress toward goals and she feels she is able to independently manage her symptoms at this time.  She plans to join the exercise sessions in the [facility] location for further ROM and strengthening and expresses discharge from PT at this time.  LTGs met.            G-Codes - 20-Sep-2014 1602    Functional Assessment Tool Used clinical judgement   Functional Limitation Self care   Self Care Goal Status [phone]) At least 20 percent but less than 40 percent impaired, limited or restricted   Self Care Discharge Status [phone]) At least 20 percent but less than 40 percent impaired, limited or restricted      Problem List Patient Active Problem List   Diagnosis Date Noted  . Arthritis of right knee 02/10/2014  . Primary osteoarthritis of right knee 02/09/2014  . Arthritis of knee, left 09/25/2013  . Arthritis of knee 09/25/2013    [name] 20-Sep-2014, 4:06 PM  [facility] [address] [place], [place], [address] Phone: [phone]   Fax:  [phone]  PHYSICAL THERAPY DISCHARGE SUMMARY  Visits from Start of Care: 7  Current functional level related to goals / functional  outcomes: Patient made progress with ROM and some pain reduction with dry needling, manual techniques.  She expresses readiness for discharge to an independent HEP and plans to continue going to [employer] to use the machines on her own.  She lives 1/2 mile from the facility.    Remaining deficits:See above   Education / Equipment: HEP  Plan: Patient agrees to discharge.  Patient goals were partially met. Patient is being discharged due to meeting the stated rehab goals.  ?????   Ruben Im, PT 08/21/2014 4:11 PM Phone: [phone] Fax: [phone]

## 2014-08-26 ENCOUNTER — Encounter: Payer: Medicare Other | Admitting: Physical Therapy

## 2014-10-02 ENCOUNTER — Other Ambulatory Visit: Payer: Self-pay | Admitting: Physical Medicine and Rehabilitation

## 2014-10-02 DIAGNOSIS — M5416 Radiculopathy, lumbar region: Secondary | ICD-10-CM

## 2014-10-12 ENCOUNTER — Ambulatory Visit
Admission: RE | Admit: 2014-10-12 | Discharge: 2014-10-12 | Disposition: A | Discharge status: Home / Self Care | Payer: Medicare Other | Source: Ambulatory Visit | Attending: Physical Medicine and Rehabilitation | Admitting: Physical Medicine and Rehabilitation

## 2014-10-12 DIAGNOSIS — M5416 Radiculopathy, lumbar region: Secondary | ICD-10-CM

## 2015-01-21 ENCOUNTER — Encounter (HOSPITAL_COMMUNITY): Payer: Self-pay | Admitting: Emergency Medicine

## 2015-01-21 ENCOUNTER — Emergency Department (HOSPITAL_COMMUNITY): Payer: Medicare Other

## 2015-01-21 ENCOUNTER — Emergency Department (HOSPITAL_COMMUNITY)
Admission: EM | Admit: 2015-01-21 | Discharge: 2015-01-21 | Disposition: A | Discharge status: Home / Self Care | Payer: Medicare Other | Attending: Emergency Medicine | Admitting: Emergency Medicine

## 2015-01-21 DIAGNOSIS — G2581 Restless legs syndrome: Secondary | ICD-10-CM | POA: Insufficient documentation

## 2015-01-21 DIAGNOSIS — Y9389 Activity, other specified: Secondary | ICD-10-CM | POA: Diagnosis not present

## 2015-01-21 DIAGNOSIS — D649 Anemia, unspecified: Secondary | ICD-10-CM | POA: Insufficient documentation

## 2015-01-21 DIAGNOSIS — S8991XA Unspecified injury of right lower leg, initial encounter: Secondary | ICD-10-CM | POA: Diagnosis present

## 2015-01-21 DIAGNOSIS — W1839XA Other fall on same level, initial encounter: Secondary | ICD-10-CM | POA: Insufficient documentation

## 2015-01-21 DIAGNOSIS — Y998 Other external cause status: Secondary | ICD-10-CM | POA: Diagnosis not present

## 2015-01-21 DIAGNOSIS — F329 Major depressive disorder, single episode, unspecified: Secondary | ICD-10-CM | POA: Diagnosis not present

## 2015-01-21 DIAGNOSIS — T07XXXA Unspecified multiple injuries, initial encounter: Secondary | ICD-10-CM

## 2015-01-21 DIAGNOSIS — K219 Gastro-esophageal reflux disease without esophagitis: Secondary | ICD-10-CM | POA: Diagnosis not present

## 2015-01-21 DIAGNOSIS — Y9289 Other specified places as the place of occurrence of the external cause: Secondary | ICD-10-CM | POA: Insufficient documentation

## 2015-01-21 DIAGNOSIS — M1990 Unspecified osteoarthritis, unspecified site: Secondary | ICD-10-CM | POA: Diagnosis not present

## 2015-01-21 DIAGNOSIS — S8992XA Unspecified injury of left lower leg, initial encounter: Secondary | ICD-10-CM | POA: Insufficient documentation

## 2015-01-21 DIAGNOSIS — Z79899 Other long term (current) drug therapy: Secondary | ICD-10-CM | POA: Diagnosis not present

## 2015-01-21 DIAGNOSIS — Z7982 Long term (current) use of aspirin: Secondary | ICD-10-CM | POA: Insufficient documentation

## 2015-01-21 DIAGNOSIS — W19XXXA Unspecified fall, initial encounter: Secondary | ICD-10-CM

## 2015-01-21 DIAGNOSIS — I10 Essential (primary) hypertension: Secondary | ICD-10-CM | POA: Diagnosis not present

## 2015-01-21 DIAGNOSIS — T148 Other injury of unspecified body region: Secondary | ICD-10-CM | POA: Diagnosis not present

## 2015-01-21 DIAGNOSIS — Z791 Long term (current) use of non-steroidal anti-inflammatories (NSAID): Secondary | ICD-10-CM | POA: Diagnosis not present

## 2015-01-21 DIAGNOSIS — R42 Dizziness and giddiness: Secondary | ICD-10-CM

## 2015-01-21 HISTORY — DX: Dizziness and giddiness: R42

## 2015-01-21 MED ORDER — ONDANSETRON HCL 4 MG/2ML IJ SOLN
4.0000 mg | Freq: Once | INTRAMUSCULAR | Status: AC
  Administered 2015-01-21: 4 mg via INTRAVENOUS
  Filled 2015-01-21: qty 2

## 2015-01-21 MED ORDER — MECLIZINE HCL 25 MG PO TABS: 25.0000 mg | ORAL_TABLET | Freq: Three times a day (TID) | ORAL | Status: DC | PRN

## 2015-01-21 MED ORDER — MECLIZINE HCL 25 MG PO TABS
25.0000 mg | ORAL_TABLET | Freq: Once | ORAL | Status: AC
  Administered 2015-01-21: 25 mg via ORAL
  Filled 2015-01-21: qty 1

## 2015-01-21 NOTE — ED Provider Notes (Signed)
CSN: [id number]     Arrival date & time 01/21/15  1820 History   First MD Initiated Contact with Patient 01/21/15 1821     Chief Complaint  Patient presents with  . Dizziness      HPI  Vision presents for evaluation of dizziness and a fall. Has history of vertigo. When she stood up this morning getting out of bed she still slight vertigo. This was familiar to her from past episodes. About 2 hours ago she had leaned over and lost her balance. She fell forward onto her knees. She has been painful since that time. That she always has pain in her knees. However she was on the ground and she laid back she began getting worsening vertigo. She had to scoot from one room to the next and called her housekeeper who came over and helped her up. Upon getting up and standing she had pain in both knees, vertigo, and presents here.  Past Medical History  Diagnosis Date  . Arthritis   . Depression   . Hypertension     not being treated  . Restless legs syndrome   . Lymphedema of both lower extremities   . GERD (gastroesophageal reflux disease)   . H/O hiatal hernia   . Anemia   . PONV (postoperative nausea and vomiting)     after Cholecysectomy  . Vertigo    Past Surgical History  Procedure Laterality Date  . Cholecystectomy    . Abdominal hysterectomy    . Eye surgery Bilateral     cataracts  . Fracture surgery Right     arm age 39  . Fracture surgery Bilateral     both thumbs  . Colonoscopy    . 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 Laparoscopic gastric banding      2010  . Total knee arthroplasty Right 02/10/2014    dr Turner Daniels  . Total knee arthroplasty Right 02/10/2014    Procedure: TOTAL KNEE ARTHROPLASTY;  Surgeon: Nestor Lewandowsky, MD;  Location: [facility] OR;  Service: Orthopedics;  Laterality: Right;   History reviewed. No pertinent family history. Social  History  Substance Use Topics  . Smoking status: Never Smoker   . Smokeless tobacco: Never Used  . Alcohol Use: Yes     Comment: rarely   OB History    No data available     Review of Systems  Constitutional: Negative for fever, chills, diaphoresis, appetite change and fatigue.  HENT: Negative for mouth sores, sore throat and trouble swallowing.   Eyes: Negative for visual disturbance.  Respiratory: Negative for cough, chest tightness, shortness of breath and wheezing.   Cardiovascular: Negative for chest pain.  Gastrointestinal: Negative for nausea, vomiting, abdominal pain, diarrhea and abdominal distention.  Endocrine: Negative for polydipsia, polyphagia and polyuria.  Genitourinary: Negative for dysuria, frequency and hematuria.  Musculoskeletal: Negative for gait problem.       Bilateral knee pain  Skin: Negative for color change, pallor and rash.  Neurological: Positive for dizziness. Negative for syncope, light-headedness and headaches.  Hematological: Does not bruise/bleed easily.  Psychiatric/Behavioral: Negative for behavioral problems and confusion.      Allergies  Review of patient's allergies indicates no known allergies.  Home Medications   Prior to Admission medications   Medication Sig Start Date End Date Taking? Authorizing Provider  ALPRAZolam Prudy Feeler) 0.25 MG tablet Take  0.25 mg by mouth at bedtime.    Yes Historical Provider, MD  aspirin EC 325 MG tablet Take 1 tablet (325 mg total) by mouth 2 (two) times daily. Patient taking differently: Take 325 mg by mouth daily.  02/10/14  Yes Allena Katz, PA-C  Cyanocobalamin (VITAMIN B 12 PO) Take 1 tablet by mouth daily.   Yes Historical Provider, MD  Iron Combinations (IRON COMPLEX PO) Take 1 tablet by mouth daily.   Yes Historical Provider, MD  MAGNESIUM-OXIDE 400 (241.3 Mg) MG tablet Take 2 tablets by mouth at bedtime. 12/22/14  Yes Historical Provider, MD  meloxicam (MOBIC) 15 MG tablet Take 15 mg by mouth  daily. 12/31/14  Yes Historical Provider, MD  Multiple Vitamins-Minerals (MULTIVITAMIN WITH MINERALS) tablet Take 1 tablet by mouth daily.   Yes Historical Provider, MD  pantoprazole (PROTONIX) 40 MG tablet Take 40 mg by mouth at bedtime. 07/10/13  Yes Historical Provider, MD  PARoxetine (PAXIL) 40 MG tablet Take 40 mg by mouth every morning.     Yes Historical Provider, MD  pramipexole (MIRAPEX) 0.25 MG tablet Take 0.25 mg by mouth 2 (two) times daily.    Yes Historical Provider, MD  traZODone (DESYREL) 150 MG tablet Take 150 mg by mouth at bedtime. 11/27/14  Yes Historical Provider, MD  VITAMIN E PO Take 1,000 Units by mouth daily.    Yes Historical Provider, MD  amoxicillin (AMOXIL) 500 MG capsule Take 500 mg by mouth 2 (two) times daily. Reported on 01/21/2015 01/07/15   Historical Provider, MD  meclizine (ANTIVERT) 25 MG tablet Take 1 tablet (25 mg total) by mouth 3 (three) times daily as needed. 01/21/15   Rolland Porter, MD  methocarbamol (ROBAXIN) 500 MG tablet Take 1 tablet (500 mg total) by mouth 2 (two) times daily with a meal. 02/10/14   Allena Katz, PA-C  oxyCODONE-acetaminophen (ROXICET) 5-325 MG per tablet Take 1 tablet by mouth every 4 (four) hours as needed. 02/10/14   Allena Katz, PA-C   BP 101/70 mmHg  Pulse 73  Temp(Src) 97.9 F (36.6 C) (Oral)  Resp 19  SpO2 95% Physical Exam  Constitutional: She is oriented to person, place, and time. She appears well-developed and well-nourished. No distress.  HENT:  Head: Normocephalic.  Eyes: Conjunctivae are normal. Pupils are equal, round, and reactive to light. No scleral icterus.  Neck: Normal range of motion. Neck supple. No thyromegaly present.  Cardiovascular: Normal rate and regular rhythm.  Exam reveals no gallop and no friction rub.   No murmur heard. Pulmonary/Chest: Effort normal and breath sounds normal. No respiratory distress. She has no wheezes. She has no rales.  Abdominal: Soft. Bowel sounds are normal. She  exhibits no distension. There is no tenderness. There is no rebound.  Musculoskeletal: Normal range of motion.  Previous total knee arthroplasty incisions. No effusion. Extensor mechanism intact.  Neurological: She is alert and oriented to person, place, and time.  Skin: Skin is warm and dry. No rash noted.  Psychiatric: She has a normal mood and affect. Her behavior is normal.    ED Course  Procedures (including critical care time) Labs Review Labs Reviewed - No data to display  Imaging Review Dg Knee Complete 4 Views Left  01/21/2015  CLINICAL DATA:  Fall EXAM: LEFT KNEE - COMPLETE 4+ VIEW COMPARISON:  None. FINDINGS: The patient is status post left total knee arthroplasty, with well-positioned left distal femoral and left proximal tibial prostheses, with no evidence of hardware fracture or loosening,  noting nonvisualization of the distal portion of the left proximal tibial prosthesis. No osseous fracture, dislocation or suspicious focal osseous lesion. No left knee joint effusion. Small superior left patellar enthesophyte. IMPRESSION: Status post left total knee arthroplasty, with no hardware complication. No osseous fracture, joint effusion or dislocation. Electronically Signed   By: Delbert Phenix M.D.   On: 01/21/2015 21:00   Dg Knee Complete 4 Views Right  01/21/2015  CLINICAL DATA:  69 year old female with fall. History of bilateral knee surgery. EXAM: RIGHT KNEE - COMPLETE 4+ VIEW COMPARISON:  Left knee radiograph dated 01/21/2015 FINDINGS: There is a total right knee arthroplasty in anatomic alignment. The bones are osteopenic. There is no acute fracture or dislocation. The soft tissues appear unremarkable. No joint effusion. IMPRESSION: No acute fracture or dislocation. Electronically Signed   By: Elgie Collard M.D.   On: 01/21/2015 21:02   I have personally reviewed and evaluated these images and lab results as part of my medical decision-making.   EKG  Interpretation   Date/Time:  Wednesday January 21 2015 18:25:41 EST Ventricular Rate:  73 PR Interval:  72 QRS Duration: 107 QT Interval:  409 QTC Calculation: 451 R Axis:   -9 Text Interpretation:  Sinus rhythm Short PR interval Abnormal R-wave  progression, early transition Confirmed by Fayrene Fearing  MD, Toiya Morrish ([id number]) on  01/21/2015 6:44:19 PM      MDM   Final diagnoses:  Vertigo  Fall, initial encounter  Multiple contusions    Given by mouth meclizine. Knee films show no acute abnormalities. Able to ambulate in the room. Plan will be home. Ice to knees Motrin or Tylenol as needed. Meclizine when necessary vertigo precautions.    Rolland Porter, MD 01/21/15 2135

## 2015-01-21 NOTE — ED Notes (Signed)
Patient transported to X-ray 

## 2015-01-21 NOTE — Discharge Instructions (Signed)
Benign Positional Vertigo Vertigo is the feeling that you or your surroundings are moving when they are not. Benign positional vertigo is the most common form of vertigo. The cause of this condition is not serious (is benign). This condition is triggered by certain movements and positions (is positional). This condition can be dangerous if it occurs while you are doing something that could endanger you or others, such as driving.  CAUSES In many cases, the cause of this condition is not known. It may be caused by a disturbance in an area of the inner ear that helps your brain to sense movement and balance. This disturbance can be caused by a viral infection (labyrinthitis), head injury, or repetitive motion. RISK FACTORS This condition is more likely to develop in:  Women.  People who are 50 years of age or older. SYMPTOMS Symptoms of this condition usually happen when you move your head or your eyes in different directions. Symptoms may start suddenly, and they usually last for less than a minute. Symptoms may include:  Loss of balance and falling.  Feeling like you are spinning or moving.  Feeling like your surroundings are spinning or moving.  Nausea and vomiting.  Blurred vision.  Dizziness.  Involuntary eye movement (nystagmus). Symptoms can be mild and cause only slight annoyance, or they can be severe and interfere with daily life. Episodes of benign positional vertigo may return (recur) over time, and they may be triggered by certain movements. Symptoms may improve over time. DIAGNOSIS This condition is usually diagnosed by medical history and a physical exam of the head, neck, and ears. You may be referred to a health care provider who specializes in ear, nose, and throat (ENT) problems (otolaryngologist) or a provider who specializes in disorders of the nervous system (neurologist). You may have additional testing, including:  MRI.  A CT scan.  Eye movement tests. Your  health care provider may ask you to change positions quickly while he or she watches you for symptoms of benign positional vertigo, such as nystagmus. Eye movement may be tested with an electronystagmogram (ENG), caloric stimulation, the Dix-Hallpike test, or the roll test.  An electroencephalogram (EEG). This records electrical activity in your brain.  Hearing tests. TREATMENT Usually, your health care provider will treat this by moving your head in specific positions to adjust your inner ear back to normal. Surgery may be needed in severe cases, but this is rare. In some cases, benign positional vertigo may resolve on its own in 2-4 weeks. HOME CARE INSTRUCTIONS Safety  Move slowly.Avoid sudden body or head movements.  Avoid driving.  Avoid operating heavy machinery.  Avoid doing any tasks that would be dangerous to you or others if a vertigo episode would occur.  If you have trouble walking or keeping your balance, try using a cane for stability. If you feel dizzy or unstable, sit down right away.  Return to your normal activities as told by your health care provider. Ask your health care provider what activities are safe for you. General Instructions  Take over-the-counter and prescription medicines only as told by your health care provider.  Avoid certain positions or movements as told by your health care provider.  Drink enough fluid to keep your urine clear or pale yellow.  Keep all follow-up visits as told by your health care provider. This is important. SEEK MEDICAL CARE IF:  You have a fever.  Your condition gets worse or you develop new symptoms.  Your family or friends   notice any behavioral changes.  Your nausea or vomiting gets worse.  You have numbness or a "pins and needles" sensation. SEEK IMMEDIATE MEDICAL CARE IF:  You have difficulty speaking or moving.  You are always dizzy.  You faint.  You develop severe headaches.  You have weakness in your  legs or arms.  You have changes in your hearing or vision.  You develop a stiff neck.  You develop sensitivity to light.   This information is not intended to replace advice given to you by your health care provider. Make sure you discuss any questions you have with your health care provider.   Document Released: 10/18/2005 Document Revised: 10/01/2014 Document Reviewed: 05/05/2014 Elsevier Interactive Patient Education 2016 [employer]  

## 2015-01-21 NOTE — ED Notes (Signed)
Pt from home via [facility] with c/o dizziness starting this am when she first stood up from bed.  Pt states dizziness resolved until about 2 hours ago.  Pt reports hx of vertigo, does not take Antivert at home.  Pt fell prior to calling EMS due to weak knees and hips not related to the dizziness.  EKG shows PVC's and 1st degree block.  Pt reports dizziness has improved, only mild lightheadedness while laying in the bed.  Pt in NAD, A&O.

## 2015-02-18 ENCOUNTER — Encounter: Payer: Self-pay | Admitting: Physical Therapy

## 2015-02-18 ENCOUNTER — Ambulatory Visit: Payer: Medicare Other | Attending: Family Medicine | Admitting: Physical Therapy

## 2015-02-18 DIAGNOSIS — M629 Disorder of muscle, unspecified: Secondary | ICD-10-CM | POA: Diagnosis present

## 2015-02-18 DIAGNOSIS — M79606 Pain in leg, unspecified: Secondary | ICD-10-CM | POA: Insufficient documentation

## 2015-02-18 DIAGNOSIS — M25552 Pain in left hip: Secondary | ICD-10-CM | POA: Diagnosis present

## 2015-02-18 DIAGNOSIS — R262 Difficulty in walking, not elsewhere classified: Secondary | ICD-10-CM | POA: Insufficient documentation

## 2015-02-18 NOTE — Therapy (Signed)
[facility] [place] [place] [facility] [address] [place], [place], [address] Phone: [phone]   Fax:  [phone]  Physical Therapy Evaluation  Patient Details  Name: Paige [name] MRN: [id number] Date of Birth: 04-Oct-1945 Referring Provider: C. Miguel Aschoff  Encounter Date: 02/18/2015      PT End of Session - 02/18/15 1538    Visit Number 1   Date for PT Re-Evaluation 04/18/15   PT Start Time 1450   PT Stop Time 1547   PT Time Calculation (min) 57 min   Activity Tolerance Patient limited by pain   Behavior During Therapy [facility] for tasks assessed/performed      Past Medical History  Diagnosis Date  . Arthritis   . Depression   . Hypertension     not being treated  . Restless legs syndrome   . Lymphedema of both lower extremities   . GERD (gastroesophageal reflux disease)   . H/O hiatal hernia   . Anemia   . PONV (postoperative nausea and vomiting)     after Cholecysectomy  . Vertigo     Past Surgical History  Procedure Laterality Date  . Cholecystectomy    . Abdominal hysterectomy    . Eye surgery Bilateral     cataracts  . Fracture surgery Right     arm age 10  . Fracture surgery Bilateral     both thumbs  . Colonoscopy    . 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 Laparoscopic gastric banding      2010  . Total knee arthroplasty Right 02/10/2014    dr Turner Daniels  . Total knee arthroplasty Right 02/10/2014    Procedure: TOTAL KNEE ARTHROPLASTY;  Surgeon: Nestor Lewandowsky, MD;  Location: [facility] OR;  Service: Orthopedics;  Laterality: Right;    There were no vitals filed for this visit.  Visit Diagnosis:  Left hip pain - Plan: PT plan of care cert/re-cert  Difficulty walking - Plan: PT plan of care cert/re-cert  Hamstring tightness of both lower extremities - Plan: PT plan of care cert/re-cert       Subjective Assessment - 02/18/15 1455    Subjective Patient reports that over the past 6 months she has been having left hip and leg pain, She also reports significant difficulty walking.  She has been seen here in the past for bilateral TKR's with most recent being January 2016.  She recovered from that but always complained of tightness and pain in the calves.   Patient Stated Goals have less pain and walk more   Currently in Pain? Yes   Pain Score 5    Pain Location Hip   Pain Orientation Left;Anterior;Posterior   Pain Descriptors / Indicators Aching;Tightness   Pain Type Chronic pain   Pain Radiating Towards some pain down the lateral left thigh pain   Pain Onset More than a month ago   Pain Frequency Constant   Aggravating Factors  walking, twisting the leg or lifting the leg the pain can be an 8-9/10   Pain Relieving Factors at rest and not moving pain can be a 1-2/10   Effect of Pain on Daily Activities difficulty walking, limping, reports difficulty putting pants on            [facility] PT Assessment - 02/18/15 0001    Assessment   Medical Diagnosis  left hip pain   Referring Provider C. Miguel Aschoff   Onset Date/Surgical Date 01/18/15   Prior Therapy for the TKR's in th past   Precautions   Precautions None   Balance Screen   Has the patient fallen in the past 6 months Yes   How many times? 1  bending over to get something under bed, fell onto knees   Has the patient had a decrease in activity level because of a fear of falling?  No   Is the patient reluctant to leave their home because of a fear of falling?  No   Home Environment   Additional Comments lives alone, does some housework   Prior Function   Level of Independence Independent   Vocation Retired   Leisure not exercising   Observation/Other Assessments-Edema    Edema --  lymphedema in the lower legs   Posture/Postural Control   Posture Comments fwd head, rounded shoulders   AROM   Overall AROM Comments AROM  of the left knee 0-105 degrees flexion, left hip flexion to 45 degrees with pain, shoulder ROM 120 degrees flexion with some pain   Strength   Overall Strength Comments left hip 3+/5 for flexion with groin pain, abduction and adduction 4-/5, extension 4-/5 with some pain.   Flexibility   Soft Tissue Assessment /Muscle Length --  very tight ITB, HS and calves   Palpation   Palpation comment She is very tender to palpation in the upper traps shoulders, Lumbar area, hips, ITB's posterior knees and lateral shins   Ambulation/Gait   Gait Comments no assistive device, slow and small steps, significant antalgic gait on the left, seems to have a drop in the pelvic height at mid stance on the left                   [facility] Adult PT Treatment/Exercise - 02/18/15 0001    Exercises   Exercises Knee/Hip   Knee/Hip Exercises: Stretches   Gastroc Stretch 30 seconds;3 reps   Knee/Hip Exercises: Aerobic   Nustep Level 4 x 5 minutes   Modalities   Modalities Electrical Stimulation;Moist Heat   Moist Heat Therapy   Number Minutes Moist Heat 15 Minutes   Moist Heat Location Hip   Electrical Stimulation   Electrical Stimulation Location left hip and knee   Electrical Stimulation Action IFC   Electrical Stimulation Parameters tolerance   Electrical Stimulation Goals Pain                     PT Long Term Goals - 02/18/15 1547    PT LONG TERM GOAL #1   Title independent with HEP    Time 8   Period Weeks   Status New   PT LONG TERM GOAL #2   Title report pain < 4/10 with activity for improved function and mobility    Time 8   Period Weeks   Status New   PT LONG TERM GOAL #3   Title walk with minimal to no deviation   Time 8   Period Weeks   Status New   PT LONG TERM GOAL #4   Title beable to put on pants without difficulty   Time 8   Period Weeks   Status New   PT LONG TERM GOAL #5   Title be able to lift leg up into bed without assist from hands and without pain >  3/10   Time 8   Period Weeks  Status New               Plan - Mar 19, 2015 1539    Clinical Impression Statement Patient with c/o pain in the left hip, lateral thigh, posterior knee and lateral calf area, she also c/o pain in the shoulders.  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  She is very tight in the LE's, very painful with any hip flexion or abduction, pain is mostly on the left groin and the lateral left hip   Pt will benefit from skilled therapeutic intervention in order to improve on the following deficits Abnormal gait;Cardiopulmonary status limiting activity;Decreased activity tolerance;Decreased balance;Decreased mobility;Decreased range of motion;Decreased strength;Increased edema;Difficulty walking;Increased muscle spasms;Impaired flexibility;Improper body mechanics;Pain   Rehab Potential Good   PT Frequency 2x / week   PT Duration 8 weeks   PT Treatment/Interventions Electrical Stimulation;Cryotherapy;Moist Heat;Iontophoresis 4mg /ml Dexamethasone;Therapeutic exercise;Therapeutic activities;Functional mobility training;Gait training;Ultrasound;Patient/family education;Balance training;Manual techniques;Passive range of motion   PT Next Visit Plan slowly add exercises and stretches   Consulted and Agree with Plan of Care Patient          G-Codes - 03-19-15 1655    Functional Assessment Tool Used Foto 62% limitation   Functional Limitation Mobility: Walking and moving around   Mobility: Walking and Moving Around Current Status [phone]) At least 60 percent but less than 80 percent impaired, limited or restricted   Mobility: Walking and Moving Around Goal Status [phone]) At least 40 percent but less than 60 percent impaired, limited or restricted       Problem List Patient Active Problem List   Diagnosis Date Noted  . Arthritis of right knee 02/10/2014  . Primary osteoarthritis of right knee 02/09/2014   . Arthritis of knee, left 09/25/2013  . Arthritis of knee 09/25/2013    Jearld Lesch., PT 03-19-15, 5:00 PM  [facility] [place] [facility] [address] [place], [place], [address] Phone: [phone]   Fax:  [phone]  Name: Paige [name] MRN: [id number] Date of Birth: [birth year]

## 2015-02-20 ENCOUNTER — Ambulatory Visit: Payer: Medicare Other | Admitting: Physical Therapy

## 2015-02-24 ENCOUNTER — Encounter: Payer: Self-pay | Admitting: Physical Therapy

## 2015-02-24 ENCOUNTER — Ambulatory Visit: Payer: Medicare Other | Admitting: Physical Therapy

## 2015-02-24 DIAGNOSIS — M629 Disorder of muscle, unspecified: Secondary | ICD-10-CM

## 2015-02-24 DIAGNOSIS — R262 Difficulty in walking, not elsewhere classified: Secondary | ICD-10-CM

## 2015-02-24 DIAGNOSIS — M25552 Pain in left hip: Secondary | ICD-10-CM | POA: Diagnosis not present

## 2015-02-24 DIAGNOSIS — M79606 Pain in leg, unspecified: Secondary | ICD-10-CM

## 2015-02-24 NOTE — Therapy (Signed)
[facility] [address] [place], [place], [address] Phone: [phone]   Fax:  [phone]  Physical Therapy Treatment  Patient Details  Name: Paige [name] MRN: [id number] Date of Birth: [birth year] Referring Provider: C. Miguel Aschoff  Encounter Date: 02/24/2015      PT End of Session - 02/24/15 0930    Visit Number 2   Date for PT Re-Evaluation 04/18/15   PT Start Time 0845   PT Stop Time 0948   PT Time Calculation (min) 63 min   Activity Tolerance Patient limited by pain   Behavior During Therapy [facility] for tasks assessed/performed      Past Medical History  Diagnosis Date  . Arthritis   . Depression   . Hypertension     not being treated  . Restless legs syndrome   . Lymphedema of both lower extremities   . GERD (gastroesophageal reflux disease)   . H/O hiatal hernia   . Anemia   . PONV (postoperative nausea and vomiting)     after Cholecysectomy  . Vertigo     Past Surgical History  Procedure Laterality Date  . Cholecystectomy    . Abdominal hysterectomy    . Eye surgery Bilateral     cataracts  . Fracture surgery Right     arm age 65  . Fracture surgery Bilateral     both thumbs  . Colonoscopy    . 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 Laparoscopic gastric banding      2010  . Total knee arthroplasty Right 02/10/2014    dr Turner Daniels  . Total knee arthroplasty Right 02/10/2014    Procedure: TOTAL KNEE ARTHROPLASTY;  Surgeon: Nestor Lewandowsky, MD;  Location: [facility];  Service: Orthopedics;  Laterality: Right;    There were no vitals filed for this visit.  Visit Diagnosis:  Left hip pain  Difficulty walking  Hamstring tightness of both lower extremities  Pain of lower extremity, unspecified laterality      Subjective Assessment - 02/24/15 0851    Subjective  Reports she felt better for a little while after the last visit   Currently in Pain? Yes   Pain Score 5    Pain Location Hip   Pain Orientation Left;Anterior;Posterior   Pain Descriptors / Indicators Aching                         [facility] Adult PT Treatment/Exercise - 02/24/15 0001    Knee/Hip Exercises: Stretches   Passive Hamstring Stretch 20 seconds;3 reps   Hip Flexor Stretch 3 reps;20 seconds   Piriformis Stretch 3 reps;20 seconds   Gastroc Stretch 30 seconds;3 reps   Knee/Hip Exercises: Aerobic   Nustep Level 4 x 6 minutes   Knee/Hip Exercises: Machines for Strengthening   Cybex Knee Extension 5# 2x15   Cybex Knee Flexion 25# 2x15   Other Machine seated row 20#, lats 20# 2x10 each   Moist Heat Therapy   Number Minutes Moist Heat 15 Minutes   Moist Heat Location Hip   Electrical Stimulation   Electrical Stimulation Location left hip and knee   Electrical Stimulation Action IFC   Electrical Stimulation Parameters tolerance   Electrical Stimulation Goals Pain   Manual Therapy   Manual therapy comments rollong of the bilateal thighs  PT Long Term Goals - 02/18/15 1547    PT LONG TERM GOAL #1   Title independent with HEP    Time 8   Period Weeks   Status New   PT LONG TERM GOAL #2   Title report pain < 4/10 with activity for improved function and mobility    Time 8   Period Weeks   Status New   PT LONG TERM GOAL #3   Title walk with minimal to no deviation   Time 8   Period Weeks   Status New   PT LONG TERM GOAL #4   Title beable to put on pants without difficulty   Time 8   Period Weeks   Status New   PT LONG TERM GOAL #5   Title be able to lift leg up into bed without assist from hands and without pain > 3/10   Time 8   Period Weeks   Status New               Plan - 02/24/15 0934    Clinical Impression Statement Patient has knots and tenderness in the LE's, she is very tight in the hip area.  She  is having a lot of difficulty with locomotion and getting up and down from sitting   PT Next Visit Plan continue to add exercises and flexibility   Consulted and Agree with Plan of Care Patient        Problem List Patient Active Problem List   Diagnosis Date Noted  . Arthritis of right knee 02/10/2014  . Primary osteoarthritis of right knee 02/09/2014  . Arthritis of knee, left 09/25/2013  . Arthritis of knee 09/25/2013    Jearld Lesch., PT 02/24/2015, 9:36 AM  [facility] [address] [place], [place], [address] Phone: [phone]   Fax:  [phone]  Name: Paige [name] MRN: [id number] Date of Birth: 1945-03-16

## 2015-02-26 ENCOUNTER — Other Ambulatory Visit: Payer: Self-pay

## 2015-02-26 DIAGNOSIS — Z1231 Encounter for screening mammogram for malignant neoplasm of breast: Secondary | ICD-10-CM

## 2015-02-27 ENCOUNTER — Encounter: Payer: Self-pay | Admitting: Physical Therapy

## 2015-02-27 ENCOUNTER — Ambulatory Visit: Payer: Medicare Other | Admitting: Physical Therapy

## 2015-02-27 ENCOUNTER — Ambulatory Visit: Payer: Medicare Other | Attending: Family Medicine | Admitting: Physical Therapy

## 2015-02-27 DIAGNOSIS — M629 Disorder of muscle, unspecified: Secondary | ICD-10-CM

## 2015-02-27 DIAGNOSIS — G729 Myopathy, unspecified: Secondary | ICD-10-CM | POA: Insufficient documentation

## 2015-02-27 DIAGNOSIS — M7989 Other specified soft tissue disorders: Secondary | ICD-10-CM | POA: Insufficient documentation

## 2015-02-27 DIAGNOSIS — R262 Difficulty in walking, not elsewhere classified: Secondary | ICD-10-CM | POA: Diagnosis not present

## 2015-02-27 DIAGNOSIS — M25561 Pain in right knee: Secondary | ICD-10-CM | POA: Diagnosis not present

## 2015-02-27 DIAGNOSIS — M79606 Pain in leg, unspecified: Secondary | ICD-10-CM | POA: Diagnosis not present

## 2015-02-27 DIAGNOSIS — M545 Low back pain: Secondary | ICD-10-CM | POA: Insufficient documentation

## 2015-02-27 DIAGNOSIS — M25552 Pain in left hip: Secondary | ICD-10-CM | POA: Diagnosis not present

## 2015-02-27 DIAGNOSIS — M25661 Stiffness of right knee, not elsewhere classified: Secondary | ICD-10-CM | POA: Insufficient documentation

## 2015-02-27 NOTE — Therapy (Signed)
[place], [place], [address] Phone: [phone]   Fax:  [phone]  Physical Therapy Treatment  Patient Details  Name: Paige [name] MRN: [id number] Date of Birth: 11-Apr-1945 Referring Provider: C. Gus Height  Encounter Date: 02/27/2015      PT End of Session - 02/27/15 0943    Visit Number 3   Date for PT Re-Evaluation 04/18/15   PT Start Time 0845   PT Stop Time 0946   PT Time Calculation (min) 61 min   Activity Tolerance Patient limited by fatigue;Patient limited by pain   Behavior During Therapy [facility] for tasks assessed/performed      Past Medical History  Diagnosis Date  . Arthritis   . Depression   . Hypertension     not being treated  . Restless legs syndrome   . Lymphedema of both lower extremities   . GERD (gastroesophageal reflux disease)   . H/O hiatal hernia   . Anemia   . PONV (postoperative nausea and vomiting)     after Cholecysectomy  . Vertigo     Past Surgical History  Procedure Laterality Date  . Cholecystectomy    . Abdominal hysterectomy    . Eye surgery Bilateral     cataracts  . Fracture surgery Right     arm age 87  . Fracture surgery Bilateral     both thumbs  . Colonoscopy    . Total knee arthroplasty Left 09/25/2013    Procedure: TOTAL KNEE ARTHROPLASTY;  Surgeon: Kerin Salen, MD;  Location: [place];  Service: Orthopedics;  Laterality: Left;  . Laparoscopic gastric banding      2010  . Total knee arthroplasty Right 02/10/2014    dr Mayer Camel  . Total knee arthroplasty Right 02/10/2014    Procedure: TOTAL KNEE ARTHROPLASTY;  Surgeon: Kerin Salen, MD;  Location: [place];  Service: Orthopedics;  Laterality: Right;    There were no vitals filed for this visit.  Visit Diagnosis:  Left hip pain  Difficulty walking  Hamstring tightness of both lower extremities  Pain of lower extremity, unspecified laterality      Subjective Assessment - 02/27/15  0903    Subjective Patient showed up 45 minutes late.  I felt a little better, more limber after last treatment   Currently in Pain? Yes   Pain Score 5    Pain Location Leg   Pain Orientation Left   Pain Descriptors / Indicators Aching   Aggravating Factors  walking, twisting leg   Pain Relieving Factors the last treatement                         [facility] Adult PT Treatment/Exercise - 02/27/15 0001    Knee/Hip Exercises: Stretches   Passive Hamstring Stretch 20 seconds;3 reps   Hip Flexor Stretch 3 reps;20 seconds   Piriformis Stretch 3 reps;20 seconds   Gastroc Stretch 30 seconds;3 reps   Knee/Hip Exercises: Aerobic   Nustep Level 4 x 6 minutes   Other Aerobic UBE level 3 x 4 minutes   Knee/Hip Exercises: Machines for Strengthening   Cybex Knee Extension 5# 2x15   Cybex Knee Flexion 25# 2x15   Other Machine seated row 20#, lats 20# 2x10 each   Knee/Hip Exercises: Supine   Other Supine Knee/Hip Exercises seated weighted ball obliques and overhead lifts   Moist Heat Therapy   Number Minutes Moist Heat 15 Minutes  Moist Heat Location Hip   Electrical Stimulation   Electrical Stimulation Location left hip and knee   Electrical Stimulation Action IFC   Electrical Stimulation Parameters tolerance   Electrical Stimulation Goals Pain                  PT Short Term Goals - 02/27/15 0944    PT SHORT TERM GOAL #1   Title independent with HEP   Time 1   Period Weeks   Status Partially Met           PT Long Term Goals - 02/18/15 1547    PT LONG TERM GOAL #1   Title independent with HEP    Time 8   Period Weeks   Status New   PT LONG TERM GOAL #2   Title report pain < 4/10 with activity for improved function and mobility    Time 8   Period Weeks   Status New   PT LONG TERM GOAL #3   Title walk with minimal to no deviation   Time 8   Period Weeks   Status New   PT LONG TERM GOAL #4   Title beable to put on pants without difficulty   Time  8   Period Weeks   Status New   PT LONG TERM GOAL #5   Title be able to lift leg up into bed without assist from hands and without pain > 3/10   Time 8   Period Weeks   Status New               Plan - 02/27/15 0944    Clinical Impression Statement Patient 45 minutes late, still c/o pain with all activity especially legs.     PT Next Visit Plan continue to add exercises and flexibility   Consulted and Agree with Plan of Care Patient        Problem List Patient Active Problem List   Diagnosis Date Noted  . Arthritis of right knee 02/10/2014  . Primary osteoarthritis of right knee 02/09/2014  . Arthritis of knee, left 09/25/2013  . Arthritis of knee 09/25/2013    Sumner Boast., PT 02/27/2015, 10:03 AM  [place] [address] [address] [place], [place], [address] Phone: [phone]   Fax:  [phone]  Name: Paige [name] MRN: [id number] Date of Birth: 1945/10/06

## 2015-03-03 ENCOUNTER — Ambulatory Visit: Payer: Medicare Other | Admitting: Physical Therapy

## 2015-03-03 ENCOUNTER — Encounter: Payer: Self-pay | Admitting: Physical Therapy

## 2015-03-03 DIAGNOSIS — R262 Difficulty in walking, not elsewhere classified: Secondary | ICD-10-CM

## 2015-03-03 DIAGNOSIS — M25552 Pain in left hip: Secondary | ICD-10-CM

## 2015-03-03 DIAGNOSIS — M629 Disorder of muscle, unspecified: Secondary | ICD-10-CM

## 2015-03-03 DIAGNOSIS — M6289 Other specified disorders of muscle: Secondary | ICD-10-CM

## 2015-03-03 NOTE — Therapy (Signed)
[place], [place], [address] Phone: [phone]   Fax:  [phone]  Physical Therapy Treatment  Patient Details  Name: Paige [name] MRN: [id number] Date of Birth: 01/08/1946 Referring Provider: C. Gus Height  Encounter Date: 03/03/2015      PT End of Session - 03/03/15 0940    Visit Number 4   Date for PT Re-Evaluation 04/18/15   PT Start Time 0846   PT Stop Time 0955   PT Time Calculation (min) 69 min      Past Medical History  Diagnosis Date  . Arthritis   . Depression   . Hypertension     not being treated  . Restless legs syndrome   . Lymphedema of both lower extremities   . GERD (gastroesophageal reflux disease)   . H/O hiatal hernia   . Anemia   . PONV (postoperative nausea and vomiting)     after Cholecysectomy  . Vertigo     Past Surgical History  Procedure Laterality Date  . Cholecystectomy    . Abdominal hysterectomy    . Eye surgery Bilateral     cataracts  . Fracture surgery Right     arm age 89  . Fracture surgery Bilateral     both thumbs  . Colonoscopy    . Total knee arthroplasty Left 09/25/2013    Procedure: TOTAL KNEE ARTHROPLASTY;  Surgeon: Kerin Salen, MD;  Location: [place];  Service: Orthopedics;  Laterality: Left;  . Laparoscopic gastric banding      2010  . Total knee arthroplasty Right 02/10/2014    dr Mayer Camel  . Total knee arthroplasty Right 02/10/2014    Procedure: TOTAL KNEE ARTHROPLASTY;  Surgeon: Kerin Salen, MD;  Location: [place];  Service: Orthopedics;  Laterality: Right;    There were no vitals filed for this visit.  Visit Diagnosis:  Left hip pain  Difficulty walking  Hamstring tightness of both lower extremities  Muscle tightness      Subjective Assessment - 03/03/15 0851    Subjective knees always ache and calves hurt   Currently in Pain? Yes   Pain Score 3    Pain Location Leg                          [facility] Adult PT Treatment/Exercise - 03/03/15 0001    Knee/Hip Exercises: Aerobic   Nustep Level 5 x 6 minutes   Stepper `   Knee/Hip Exercises: Machines for Strengthening   Cybex Knee Extension 5# 2x15   Cybex Knee Flexion 25# 2x15   Other Machine seated row 20#, lats 20# 2x10 each   Knee/Hip Exercises: Standing   Other Standing Knee Exercises sit to stand with weight ball for UE 2 sets 5   Knee/Hip Exercises: Seated   Ball Squeeze 20 times   Abduction/Adduction  Strengthening;Both;20 reps  red tband   Moist Heat Therapy   Number Minutes Moist Heat 15 Minutes   Moist Heat Location Hip   Electrical Stimulation   Electrical Stimulation Location left hip and knee   Electrical Stimulation Action IFC   Electrical Stimulation Goals Pain   Manual Therapy   Manual Therapy Passive ROM  rolling calves,quads and ITB   Passive ROM bilateral calves and hips                  PT Short Term Goals - 02/27/15 7482  PT SHORT TERM GOAL #1   Title independent with HEP   Time 1   Period Weeks   Status Partially Met           PT Long Term Goals - 02/18/15 1547    PT LONG TERM GOAL #1   Title independent with HEP    Time 8   Period Weeks   Status New   PT LONG TERM GOAL #2   Title report pain < 4/10 with activity for improved function and mobility    Time 8   Period Weeks   Status New   PT LONG TERM GOAL #3   Title walk with minimal to no deviation   Time 8   Period Weeks   Status New   PT LONG TERM GOAL #4   Title beable to put on pants without difficulty   Time 8   Period Weeks   Status New   PT LONG TERM GOAL #5   Title be able to lift leg up into bed without assist from hands and without pain > 3/10   Time 8   Period Weeks   Status New               Plan - 03/03/15 0941    Clinical Impression Statement pt with improved function.still with increased LE tightness and sensitivity. lasrge knots and trigger points in quads/IT   PT Next Visit Plan  continue to add exercises and flexibility        Problem List Patient Active Problem List   Diagnosis Date Noted  . Arthritis of right knee 02/10/2014  . Primary osteoarthritis of right knee 02/09/2014  . Arthritis of knee, left 09/25/2013  . Arthritis of knee 09/25/2013    PAYSEUR,ANGIE PTA 03/03/2015, 9:43 AM  [place] [address] [place] [address] [place], [place], [address] Phone: [phone]   Fax:  [phone]  Name: SALOMA CADENA MRN: [id number] Date of Birth: 07/13/1945

## 2015-03-04 DIAGNOSIS — R351 Nocturia: Secondary | ICD-10-CM | POA: Diagnosis not present

## 2015-03-04 DIAGNOSIS — Z0000 Encounter for general adult medical examination without abnormal findings: Secondary | ICD-10-CM | POA: Diagnosis not present

## 2015-03-04 DIAGNOSIS — N3946 Mixed incontinence: Secondary | ICD-10-CM | POA: Diagnosis not present

## 2015-03-04 DIAGNOSIS — R350 Frequency of micturition: Secondary | ICD-10-CM | POA: Diagnosis not present

## 2015-03-06 ENCOUNTER — Encounter: Payer: Self-pay | Admitting: Physical Therapy

## 2015-03-06 ENCOUNTER — Ambulatory Visit: Payer: Medicare Other | Admitting: Physical Therapy

## 2015-03-06 DIAGNOSIS — M25552 Pain in left hip: Secondary | ICD-10-CM

## 2015-03-06 DIAGNOSIS — M6289 Other specified disorders of muscle: Secondary | ICD-10-CM

## 2015-03-06 DIAGNOSIS — R262 Difficulty in walking, not elsewhere classified: Secondary | ICD-10-CM

## 2015-03-06 NOTE — Therapy (Signed)
[place], [place], [address] Phone: [phone]   Fax:  [phone]  Physical Therapy Treatment  Patient Details  Name: Paige [name] MRN: [id number] Date of Birth: 02/06/1945 Referring Provider: C. Gus Height  Encounter Date: 03/06/2015      PT End of Session - 03/06/15 0937    Visit Number 5   PT Start Time 0845   PT Stop Time 0950   PT Time Calculation (min) 65 min      Past Medical History  Diagnosis Date  . Arthritis   . Depression   . Hypertension     not being treated  . Restless legs syndrome   . Lymphedema of both lower extremities   . GERD (gastroesophageal reflux disease)   . H/O hiatal hernia   . Anemia   . PONV (postoperative nausea and vomiting)     after Cholecysectomy  . Vertigo     Past Surgical History  Procedure Laterality Date  . Cholecystectomy    . Abdominal hysterectomy    . Eye surgery Bilateral     cataracts  . Fracture surgery Right     arm age 87  . Fracture surgery Bilateral     both thumbs  . Colonoscopy    . Total knee arthroplasty Left 09/25/2013    Procedure: TOTAL KNEE ARTHROPLASTY;  Surgeon: Kerin Salen, MD;  Location: [place];  Service: Orthopedics;  Laterality: Left;  . Laparoscopic gastric banding      2010  . Total knee arthroplasty Right 02/10/2014    dr Mayer Camel  . Total knee arthroplasty Right 02/10/2014    Procedure: TOTAL KNEE ARTHROPLASTY;  Surgeon: Kerin Salen, MD;  Location: [place];  Service: Orthopedics;  Laterality: Right;    There were no vitals filed for this visit.  Visit Diagnosis:  Left hip pain  Difficulty walking  Muscle tightness      Subjective Assessment - 03/06/15 0849    Subjective PT is helping, left lower leg and calf are giving me the most trouble   Currently in Pain? Yes   Pain Score 7    Pain Location Leg   Pain Orientation Left            [facility] PT Assessment - 03/06/15 0001    AROM    Overall AROM Comments left knee 0-120                     [facility] Adult PT Treatment/Exercise - 03/06/15 0001    Knee/Hip Exercises: Aerobic   Nustep Level 5 x 8 minutes   Other Aerobic UBE level 3 x 4 minutes   Knee/Hip Exercises: Machines for Strengthening   Other Machine seated row 20#, lats 20# 2x10 each   Knee/Hip Exercises: Seated   Long Arc Quad Strengthening;Both;2 sets;10 reps  red tband   Ball Squeeze 20 times   Hamstring Curl Strengthening;2 sets;10 reps  red tband   Abduction/Adduction  Strengthening;Both;20 reps   Moist Heat Therapy   Number Minutes Moist Heat 15 Minutes   Moist Heat Location Hip   Electrical Stimulation   Electrical Stimulation Location bilateral calf   Electrical Stimulation Action premod   Electrical Stimulation Goals Pain   Manual Therapy   Manual Therapy Passive ROM  rolling   Passive ROM bilateral calves and hips                  PT Short  Term Goals - 03/06/15 0917    PT SHORT TERM GOAL #1   Title independent with HEP   Status Partially Met           PT Long Term Goals - 03/06/15 0917    PT LONG TERM GOAL #1   Title independent with HEP    Status On-going   PT LONG TERM GOAL #2   Title report pain < 4/10 with activity for improved function and mobility    Status On-going   PT LONG TERM GOAL #3   Title walk with minimal to no deviation   PT LONG TERM GOAL #4   Title beable to put on pants without difficulty   Status On-going   PT LONG TERM GOAL #5   Title be able to lift leg up into bed without assist from hands and without pain > 3/10   Status On-going               Plan - 03/06/15 2952    Clinical Impression Statement fatigue with LE ther ex and some difficulty with left leg cramps. very tender left lateral calf. Left knee ROM WFLs. Pt is moving better and feels relieve with PT,encouraged increased activity at home.   PT Next Visit Plan continue to add exercises and flexibility         Problem List Patient Active Problem List   Diagnosis Date Noted  . Arthritis of right knee 02/10/2014  . Primary osteoarthritis of right knee 02/09/2014  . Arthritis of knee, left 09/25/2013  . Arthritis of knee 09/25/2013    PAYSEUR,ANGIE PTA 03/06/2015, 9:39 AM  [place] [address] [place], [place], [address] Phone: [phone]   Fax:  [phone]  Name: Paige [name] MRN: [id number] Date of Birth: Sep 23, 1945

## 2015-03-10 ENCOUNTER — Encounter: Payer: Self-pay | Admitting: Physical Therapy

## 2015-03-10 ENCOUNTER — Ambulatory Visit: Payer: Medicare Other | Admitting: Physical Therapy

## 2015-03-10 DIAGNOSIS — M629 Disorder of muscle, unspecified: Secondary | ICD-10-CM

## 2015-03-10 DIAGNOSIS — M25552 Pain in left hip: Secondary | ICD-10-CM | POA: Diagnosis not present

## 2015-03-10 DIAGNOSIS — R262 Difficulty in walking, not elsewhere classified: Secondary | ICD-10-CM

## 2015-03-10 DIAGNOSIS — M6289 Other specified disorders of muscle: Secondary | ICD-10-CM

## 2015-03-10 DIAGNOSIS — M79606 Pain in leg, unspecified: Secondary | ICD-10-CM

## 2015-03-10 NOTE — Therapy (Signed)
[place], [place], [address] Phone: [phone]   Fax:  [phone]  Physical Therapy Treatment  Patient Details  Name: Paige [name] MRN: [id number] Date of Birth: [birth year] Referring Provider: C. Gus Height  Encounter Date: 03/10/2015      PT End of Session - 03/10/15 1336    Visit Number 6   PT Start Time 1517   PT Stop Time 1415   PT Time Calculation (min) 62 min   Activity Tolerance Patient limited by fatigue;Patient limited by pain   Behavior During Therapy [facility] for tasks assessed/performed      Past Medical History  Diagnosis Date  . Arthritis   . Depression   . Hypertension     not being treated  . Restless legs syndrome   . Lymphedema of both lower extremities   . GERD (gastroesophageal reflux disease)   . H/O hiatal hernia   . Anemia   . PONV (postoperative nausea and vomiting)     after Cholecysectomy  . Vertigo     Past Surgical History  Procedure Laterality Date  . Cholecystectomy    . Abdominal hysterectomy    . Eye surgery Bilateral     cataracts  . Fracture surgery Right     arm age 86  . Fracture surgery Bilateral     both thumbs  . Colonoscopy    . Total knee arthroplasty Left 09/25/2013    Procedure: TOTAL KNEE ARTHROPLASTY;  Surgeon: Kerin Salen, MD;  Location: [place];  Service: Orthopedics;  Laterality: Left;  . Laparoscopic gastric banding      2010  . Total knee arthroplasty Right 02/10/2014    dr Mayer Camel  . Total knee arthroplasty Right 02/10/2014    Procedure: TOTAL KNEE ARTHROPLASTY;  Surgeon: Kerin Salen, MD;  Location: [facility];  Service: Orthopedics;  Laterality: Right;    There were no vitals filed for this visit.  Visit Diagnosis:  Left hip pain  Difficulty walking  Muscle tightness  Hamstring tightness of both lower extremities  Pain of lower extremity, unspecified laterality      Subjective Assessment - 03/10/15 1316    Subjective I have been very active at [employer] this morning, legs are tired and sore.   Currently in Pain? Yes   Pain Score 5    Pain Location Leg   Pain Orientation Right;Left   Pain Descriptors / Indicators Aching;Sore   Pain Type Chronic pain   Aggravating Factors  walking, standing   Pain Relieving Factors treatment            [facility] PT Assessment - 03/10/15 0001    Ambulation/Gait   Gait Comments Still no assistive device, walking with improved posture and longer step length from evaluation                     [facility] Adult PT Treatment/Exercise - 03/10/15 0001    Knee/Hip Exercises: Stretches   Passive Hamstring Stretch 20 seconds;3 reps   Piriformis Stretch 3 reps;20 seconds   Gastroc Stretch 30 seconds;3 reps   Knee/Hip Exercises: Aerobic   Nustep Level 5 x 8 minutes   Other Aerobic UBE level 3 x 4 minutes   Knee/Hip Exercises: Machines for Strengthening   Cybex Knee Extension --   Cybex Knee Flexion --   Other Machine seated row 20#, lats 20# 2x10 each   Knee/Hip Exercises: Seated   Long [employer]  Strengthening;Both;2 sets;10 reps   Long Arc Quad Weight 3 lbs.   Ball Squeeze 20 times   Hamstring Curl Strengthening;2 sets;10 reps   Hamstring Limitations 3   Moist Heat Therapy   Number Minutes Moist Heat 15 Minutes   Moist Heat Location Other (comment)   Electrical Stimulation   Electrical Stimulation Location bilateral calf   Electrical Stimulation Action premod   Electrical Stimulation Goals Pain                  PT Short Term Goals - 03/06/15 0917    PT SHORT TERM GOAL #1   Title independent with HEP   Status Partially Met           PT Long Term Goals - 03/10/15 1342    PT LONG TERM GOAL #2   Title report pain < 4/10 with activity for improved function and mobility    Status On-going               Plan - 03/10/15 1339    Clinical Impression Statement Patient continues to c/o pain inthe hips and calves, has  tightness here but is getting better, puts forth good effort for all exercises even if she reports pain   PT Next Visit Plan continue to add exercises and flexibility   Consulted and Agree with Plan of Care Patient        Problem List Patient Active Problem List   Diagnosis Date Noted  . Arthritis of right knee 02/10/2014  . Primary osteoarthritis of right knee 02/09/2014  . Arthritis of knee, left 09/25/2013  . Arthritis of knee 09/25/2013    Sumner Boast., PT 03/10/2015, 2:23 PM  [place] [place], [place], [address] Phone: [phone]   Fax:  [phone]  Name: Paige [name] MRN: [id number] Date of Birth: 23-Oct-1945

## 2015-03-13 ENCOUNTER — Encounter: Payer: Self-pay | Admitting: Physical Therapy

## 2015-03-13 ENCOUNTER — Ambulatory Visit: Payer: Medicare Other | Admitting: Physical Therapy

## 2015-03-13 DIAGNOSIS — M79606 Pain in leg, unspecified: Secondary | ICD-10-CM

## 2015-03-13 DIAGNOSIS — M25552 Pain in left hip: Secondary | ICD-10-CM

## 2015-03-13 DIAGNOSIS — R262 Difficulty in walking, not elsewhere classified: Secondary | ICD-10-CM

## 2015-03-13 DIAGNOSIS — M629 Disorder of muscle, unspecified: Secondary | ICD-10-CM

## 2015-03-13 DIAGNOSIS — M6289 Other specified disorders of muscle: Secondary | ICD-10-CM

## 2015-03-13 NOTE — Therapy (Signed)
[place], [place], [address] Phone: [phone]   Fax:  [phone]  Physical Therapy Treatment  Patient Details  Name: Paige [name] MRN: [id number] Date of Birth: 1945/06/10 Referring Provider: C. Gus Height  Encounter Date: 03/13/2015      PT End of Session - 03/13/15 1008    Visit Number 7   Date for PT Re-Evaluation 04/18/15   PT Start Time 0845   PT Stop Time 0945   PT Time Calculation (min) 60 min      Past Medical History  Diagnosis Date  . Arthritis   . Depression   . Hypertension     not being treated  . Restless legs syndrome   . Lymphedema of both lower extremities   . GERD (gastroesophageal reflux disease)   . H/O hiatal hernia   . Anemia   . PONV (postoperative nausea and vomiting)     after Cholecysectomy  . Vertigo     Past Surgical History  Procedure Laterality Date  . Cholecystectomy    . Abdominal hysterectomy    . Eye surgery Bilateral     cataracts  . Fracture surgery Right     arm age 89  . Fracture surgery Bilateral     both thumbs  . Colonoscopy    . Total knee arthroplasty Left 09/25/2013    Procedure: TOTAL KNEE ARTHROPLASTY;  Surgeon: Kerin Salen, MD;  Location: [facility];  Service: Orthopedics;  Laterality: Left;  . Laparoscopic gastric banding      2010  . Total knee arthroplasty Right 02/10/2014    dr Mayer Camel  . Total knee arthroplasty Right 02/10/2014    Procedure: TOTAL KNEE ARTHROPLASTY;  Surgeon: Kerin Salen, MD;  Location: [facility];  Service: Orthopedics;  Laterality: Right;    There were no vitals filed for this visit.  Visit Diagnosis:  Left hip pain  Difficulty walking  Muscle tightness  Hamstring tightness of both lower extremities  Pain of lower extremity, unspecified laterality      Subjective Assessment - 03/13/15 0848    Subjective My left groin is hurting   Currently in Pain? Yes   Pain Score 4    Pain Location Groin    Pain Orientation Left;Anterior   Pain Descriptors / Indicators Aching;Sore   Pain Type Chronic pain   Pain Onset More than a month ago   Aggravating Factors  standing                         [facility] Adult PT Treatment/Exercise - 03/13/15 0001    Knee/Hip Exercises: Stretches   Passive Hamstring Stretch 20 seconds;3 reps   Hip Flexor Stretch 3 reps;20 seconds   Piriformis Stretch 3 reps;20 seconds   Gastroc Stretch 30 seconds;3 reps   Other Knee/Hip Stretches adductor stretch   Knee/Hip Exercises: Aerobic   Nustep Level 5 x 8 minutes   Other Aerobic UBE level 4 x 5 minutes   Knee/Hip Exercises: Machines for Strengthening   Cybex Knee Extension 5# 2x15   Cybex Knee Flexion 25# 2x15   Other Machine seated row 20#, lats 20# 2x10 each   Knee/Hip Exercises: Seated   Long Arc Quad Strengthening;Both;2 sets;10 reps   Long Arc Quad Weight 3 lbs.   Ball Squeeze 20 times   Knee/Hip Flexion 20 reps marching 3#   Abduction/Adduction  Strengthening;Both;20 reps   Moist Heat Therapy  Number Minutes Moist Heat 15 Minutes   Moist Heat Location Other (comment)   Electrical Stimulation   Electrical Stimulation Location bilateral calf   Electrical Stimulation Action premod   Electrical Stimulation Goals Pain   Manual Therapy   Manual therapy comments rolling of the thighs   Passive ROM bilateral calves and hips                  PT Short Term Goals - 03/13/15 1009    PT SHORT TERM GOAL #1   Title independent with HEP   Status Partially Met           PT Long Term Goals - 03/10/15 1342    PT LONG TERM GOAL #2   Title report pain < 4/10 with activity for improved function and mobility    Status On-going               Plan - 03/13/15 1008    Clinical Impression Statement Patient with better walking and seems to have a better outlook. She is progressing but the pain in the left groin is her biggest issue   PT Next Visit Plan continue to add  exercises and flexibility   Consulted and Agree with Plan of Care Patient        Problem List Patient Active Problem List   Diagnosis Date Noted  . Arthritis of right knee 02/10/2014  . Primary osteoarthritis of right knee 02/09/2014  . Arthritis of knee, left 09/25/2013  . Arthritis of knee 09/25/2013    Sumner Boast., PT 03/13/2015, 10:10 AM  [place] [address] [place], [place], [address] Phone: [phone]   Fax:  [phone]  Name: Paige [name] MRN: [id number] Date of Birth: 05-14-1945

## 2015-03-16 ENCOUNTER — Ambulatory Visit
Admission: RE | Admit: 2015-03-16 | Discharge: 2015-03-16 | Disposition: A | Discharge status: Home / Self Care | Payer: Medicare Other | Source: Ambulatory Visit

## 2015-03-16 DIAGNOSIS — Z1231 Encounter for screening mammogram for malignant neoplasm of breast: Secondary | ICD-10-CM | POA: Diagnosis not present

## 2015-03-17 ENCOUNTER — Encounter: Payer: Self-pay | Admitting: Physical Therapy

## 2015-03-17 ENCOUNTER — Ambulatory Visit: Payer: Medicare Other | Admitting: Physical Therapy

## 2015-03-17 DIAGNOSIS — R262 Difficulty in walking, not elsewhere classified: Secondary | ICD-10-CM

## 2015-03-17 DIAGNOSIS — M25552 Pain in left hip: Secondary | ICD-10-CM | POA: Diagnosis not present

## 2015-03-17 DIAGNOSIS — M79606 Pain in leg, unspecified: Secondary | ICD-10-CM

## 2015-03-17 NOTE — Therapy (Signed)
[place] [address] [place], [place], [address] Phone: [phone]   Fax:  [phone]  Physical Therapy Treatment  Patient Details  Name: Paige [name] MRN: [id number] Date of Birth: Apr 02, 1945 Referring Provider: C. Gus Height  Encounter Date: 03/17/2015      PT End of Session - 03/17/15 0848    Visit Number 8   Date for PT Re-Evaluation 04/18/15   PT Start Time 0800   PT Stop Time 0900   PT Time Calculation (min) 60 min   Activity Tolerance Patient limited by fatigue;Patient limited by pain   Behavior During Therapy [facility] for tasks assessed/performed      Past Medical History  Diagnosis Date  . Arthritis   . Depression   . Hypertension     not being treated  . Restless legs syndrome   . Lymphedema of both lower extremities   . GERD (gastroesophageal reflux disease)   . H/O hiatal hernia   . Anemia   . PONV (postoperative nausea and vomiting)     after Cholecysectomy  . Vertigo     Past Surgical History  Procedure Laterality Date  . Cholecystectomy    . Abdominal hysterectomy    . Eye surgery Bilateral     cataracts  . Fracture surgery Right     arm age 41  . Fracture surgery Bilateral     both thumbs  . Colonoscopy    . Total knee arthroplasty Left 09/25/2013    Procedure: TOTAL KNEE ARTHROPLASTY;  Surgeon: Kerin Salen, MD;  Location: [place] [facility];  Service: Orthopedics;  Laterality: Left;  . Laparoscopic gastric banding      2010  . Total knee arthroplasty Right 02/10/2014    dr Mayer Camel  . Total knee arthroplasty Right 02/10/2014    Procedure: TOTAL KNEE ARTHROPLASTY;  Surgeon: Kerin Salen, MD;  Location: [facility];  Service: Orthopedics;  Laterality: Right;    There were no vitals filed for this visit.  Visit Diagnosis:  Left hip pain  Difficulty walking  Pain of lower extremity, unspecified laterality      Subjective Assessment - 03/17/15 0800    Subjective "Im a.little stiff in the  morning, my L knee is hurting"   Currently in Pain? Yes   Pain Score 5    Pain Location Knee   Pain Orientation Left;Anterior                         [facility] Adult PT Treatment/Exercise - 03/17/15 0001    Knee/Hip Exercises: Aerobic   Nustep Level 5 x 8 minutes   Other Aerobic UBE level 4 x 5 minutes   Knee/Hip Exercises: Machines for Strengthening   Cybex Knee Extension 5# 2x15   Cybex Knee Flexion 25# 2x15   Other Machine seated row 20#, lats 20# 2x15 each   Knee/Hip Exercises: Seated   Long Arc Quad Strengthening;Both;2 sets;10 reps   Long Arc Quad Weight 3 lbs.   Ball Squeeze 20 times   Knee/Hip Flexion 2x10 reps marching 3#   Electrical Stimulation   Electrical Stimulation Location Posterior L knee, anterior L shin   Electrical Stimulation Action premod   Electrical Stimulation Goals Pain                  PT Short Term Goals - 03/13/15 1009    PT SHORT TERM GOAL #1   Title independent with HEP  Status Partially Met           PT Long Term Goals - 03/10/15 1342    PT LONG TERM GOAL #2   Title report pain < 4/10 with activity for improved function and mobility    Status On-going               Plan - 03/17/15 0849    Clinical Impression Statement Pt with a decrease activity tolerance with exercises, c/o L hip pain with seated march. Today pt with posterior knee pain as biggest issue.   Pt will benefit from skilled therapeutic intervention in order to improve on the following deficits Abnormal gait;Cardiopulmonary status limiting activity;Decreased activity tolerance;Decreased balance;Decreased mobility;Decreased range of motion;Decreased strength;Increased edema;Difficulty walking;Increased muscle spasms;Impaired flexibility;Improper body mechanics;Pain   Rehab Potential Good   PT Duration 8 weeks   PT Treatment/Interventions Electrical Stimulation;Cryotherapy;Moist Heat;Iontophoresis 25m/ml Dexamethasone;Therapeutic  exercise;Therapeutic activities;Functional mobility training;Gait training;Ultrasound;Patient/family education;Balance training;Manual techniques;Passive range of motion   PT Next Visit Plan continue to add exercises and flexibility        Problem List Patient Active Problem List   Diagnosis Date Noted  . Arthritis of right knee 02/10/2014  . Primary osteoarthritis of right knee 02/09/2014  . Arthritis of knee, left 09/25/2013  . Arthritis of knee 09/25/2013    RScot Jun PTA  03/17/2015, 8:51 AM  [place] [address]Phone: [phone]  Fax:  [phone] Name: RCATARINA HUNTLEYMRN: [id number] of Birth: 04/20/1945/06/01

## 2015-03-20 ENCOUNTER — Ambulatory Visit: Payer: Medicare Other | Admitting: Physical Therapy

## 2015-03-23 DIAGNOSIS — M160 Bilateral primary osteoarthritis of hip: Secondary | ICD-10-CM | POA: Diagnosis not present

## 2015-03-24 ENCOUNTER — Encounter: Payer: Self-pay | Admitting: Physical Therapy

## 2015-03-24 ENCOUNTER — Ambulatory Visit: Payer: Medicare Other | Admitting: Physical Therapy

## 2015-03-24 DIAGNOSIS — M25561 Pain in right knee: Secondary | ICD-10-CM

## 2015-03-24 DIAGNOSIS — R262 Difficulty in walking, not elsewhere classified: Secondary | ICD-10-CM

## 2015-03-24 DIAGNOSIS — M629 Disorder of muscle, unspecified: Secondary | ICD-10-CM

## 2015-03-24 DIAGNOSIS — M545 Low back pain, unspecified: Secondary | ICD-10-CM

## 2015-03-24 DIAGNOSIS — M25552 Pain in left hip: Secondary | ICD-10-CM | POA: Diagnosis not present

## 2015-03-24 DIAGNOSIS — M79606 Pain in leg, unspecified: Secondary | ICD-10-CM

## 2015-03-24 DIAGNOSIS — M7989 Other specified soft tissue disorders: Secondary | ICD-10-CM

## 2015-03-24 DIAGNOSIS — M6289 Other specified disorders of muscle: Secondary | ICD-10-CM

## 2015-03-24 DIAGNOSIS — M25661 Stiffness of right knee, not elsewhere classified: Secondary | ICD-10-CM

## 2015-03-24 NOTE — Therapy (Signed)
[place], [place], [address] Phone: [phone]   Fax:  [phone]  Physical Therapy Treatment  Patient Details  Name: Paige [name] MRN: [id number] Date of Birth: 06-16-1945 Referring Provider: C. Gus Height  Encounter Date: 03/24/2015      PT End of Session - 03/24/15 0927    Visit Number 9   Date for PT Re-Evaluation 04/18/15   PT Start Time 0846   PT Stop Time 0928   PT Time Calculation (min) 42 min   Activity Tolerance Patient limited by fatigue;Patient limited by pain   Behavior During Therapy [facility] for tasks assessed/performed      Past Medical History  Diagnosis Date  . Arthritis   . Depression   . Hypertension     not being treated  . Restless legs syndrome   . Lymphedema of both lower extremities   . GERD (gastroesophageal reflux disease)   . H/O hiatal hernia   . Anemia   . PONV (postoperative nausea and vomiting)     after Cholecysectomy  . Vertigo     Past Surgical History  Procedure Laterality Date  . Cholecystectomy    . Abdominal hysterectomy    . Eye surgery Bilateral     cataracts  . Fracture surgery Right     arm age 62  . Fracture surgery Bilateral     both thumbs  . Colonoscopy    . Total knee arthroplasty Left 09/25/2013    Procedure: TOTAL KNEE ARTHROPLASTY;  Surgeon: Kerin Salen, MD;  Location: [facility];  Service: Orthopedics;  Laterality: Left;  . Laparoscopic gastric banding      2010  . Total knee arthroplasty Right 02/10/2014    dr Mayer Camel  . Total knee arthroplasty Right 02/10/2014    Procedure: TOTAL KNEE ARTHROPLASTY;  Surgeon: Kerin Salen, MD;  Location: [facility];  Service: Orthopedics;  Laterality: Right;    There were no vitals filed for this visit.  Visit Diagnosis:  Left hip pain  Right knee pain  Difficulty walking  Swelling of limb  Pain of lower extremity, unspecified laterality  Midline low back pain without  sciatica  Muscle tightness  Hamstring tightness of both lower extremities  Knee stiffness, right      Subjective Assessment - 03/24/15 0849    Subjective Pt stated that she went to the orthopedic MD yesterday and go some new pain med's, Hips not too bad, knees hurt today. Pt reports she had an appointment to have another injection in her hip.   Currently in Pain? Yes   Pain Score 3    Pain Location Knee   Pain Orientation Left;Right   Pain Descriptors / Indicators Sore                         [facility] Adult PT Treatment/Exercise - 03/24/15 0001    Knee/Hip Exercises: Aerobic   Nustep Level 2 x 7 minutes   Other Aerobic UBE level 4 x 5 minutes   Knee/Hip Exercises: Machines for Strengthening   Cybex Knee Extension 5# 2x15   Cybex Knee Flexion 25# 2x15   Other Machine seated row 25#; lats 20# 2x15 each   Knee/Hip Exercises: Standing   Other Standing Knee Exercises Standing march 2x10   Knee/Hip Exercises: Seated   Long Arc Quad Strengthening;Both;2 sets;10 reps   Long Arc Quad Weight 3 lbs.   Ball Squeeze 2 x15  times   Abduction/Adduction  Strengthening;Both;20 reps                  PT Short Term Goals - 03/13/15 1009    PT SHORT TERM GOAL #1   Title independent with HEP   Status Partially Met           PT Long Term Goals - 03/10/15 1342    PT LONG TERM GOAL #2   Title report pain < 4/10 with activity for improved function and mobility    Status On-going               Plan - 03/24/15 0927    Clinical Impression Statement Pt continues to fatigue with exercises. Pt reports decrease pain overall that she credits to her new pain med's. Pt completed all exercises well does reports pain in proximal quads with seated hip flexion with and without out. Able  to complete intervention in standing without weight.   Pt will benefit from skilled therapeutic intervention in order to improve on the following deficits Abnormal gait;Cardiopulmonary  status limiting activity;Decreased activity tolerance;Decreased balance;Decreased mobility;Decreased range of motion;Decreased strength;Increased edema;Difficulty walking;Increased muscle spasms;Impaired flexibility;Improper body mechanics;Pain   Rehab Potential Good   PT Frequency 2x / week   PT Duration 8 weeks   PT Treatment/Interventions Electrical Stimulation;Cryotherapy;Moist Heat;Iontophoresis 79m/ml Dexamethasone;Therapeutic exercise;Therapeutic activities;Functional mobility training;Gait training;Ultrasound;Patient/family education;Balance training;Manual techniques;Passive range of motion   PT Next Visit Plan continue to add exercises and flexibility        Problem List Patient Active Problem List   Diagnosis Date Noted  . Arthritis of right knee 02/10/2014  . Primary osteoarthritis of right knee 02/09/2014  . Arthritis of knee, left 09/25/2013  . Arthritis of knee 09/25/2013    RScot Jun PTA 03/24/2015, 9:30 AM  [place] [address]Phone: [phone]  Fax:  [phone] Name: Paige KOSKAMRN: [id number] of Birth: 408-11-1945

## 2015-03-27 ENCOUNTER — Encounter: Payer: Self-pay | Admitting: Physical Therapy

## 2015-03-27 ENCOUNTER — Ambulatory Visit: Payer: Medicare Other | Attending: Family Medicine | Admitting: Physical Therapy

## 2015-03-27 DIAGNOSIS — M25552 Pain in left hip: Secondary | ICD-10-CM | POA: Diagnosis not present

## 2015-03-27 DIAGNOSIS — M25561 Pain in right knee: Secondary | ICD-10-CM | POA: Diagnosis not present

## 2015-03-27 DIAGNOSIS — R262 Difficulty in walking, not elsewhere classified: Secondary | ICD-10-CM | POA: Diagnosis not present

## 2015-03-27 DIAGNOSIS — M629 Disorder of muscle, unspecified: Secondary | ICD-10-CM | POA: Diagnosis not present

## 2015-03-27 DIAGNOSIS — M79606 Pain in leg, unspecified: Secondary | ICD-10-CM | POA: Diagnosis not present

## 2015-03-27 DIAGNOSIS — M7989 Other specified soft tissue disorders: Secondary | ICD-10-CM | POA: Diagnosis not present

## 2015-03-27 DIAGNOSIS — M25661 Stiffness of right knee, not elsewhere classified: Secondary | ICD-10-CM

## 2015-03-27 NOTE — Therapy (Signed)
[place], [place], [address] Phone: [phone]   Fax:  [phone]  Physical Therapy Treatment  Patient Details  Name: Paige [name] MRN: [id number] Date of Birth: 1945-10-19 Referring Provider: C. Gus Height  Encounter Date: 03/27/2015      PT End of Session - 03/27/15 1100    Visit Number 10   Date for PT Re-Evaluation 04/18/15   PT Start Time 1117   PT Stop Time 1100   PT Time Calculation (min) 45 min   Activity Tolerance Patient limited by fatigue;Patient limited by pain   Behavior During Therapy [facility] for tasks assessed/performed      Past Medical History  Diagnosis Date  . Arthritis   . Depression   . Hypertension     not being treated  . Restless legs syndrome   . Lymphedema of both lower extremities   . GERD (gastroesophageal reflux disease)   . H/O hiatal hernia   . Anemia   . PONV (postoperative nausea and vomiting)     after Cholecysectomy  . Vertigo     Past Surgical History  Procedure Laterality Date  . Cholecystectomy    . Abdominal hysterectomy    . Eye surgery Bilateral     cataracts  . Fracture surgery Right     arm age 39  . Fracture surgery Bilateral     both thumbs  . Colonoscopy    . Total knee arthroplasty Left 09/25/2013    Procedure: TOTAL KNEE ARTHROPLASTY;  Surgeon: Kerin Salen, MD;  Location: [facility];  Service: Orthopedics;  Laterality: Left;  . Laparoscopic gastric banding      2010  . Total knee arthroplasty Right 02/10/2014    dr Mayer Camel  . Total knee arthroplasty Right 02/10/2014    Procedure: TOTAL KNEE ARTHROPLASTY;  Surgeon: Kerin Salen, MD;  Location: [facility];  Service: Orthopedics;  Laterality: Right;    There were no vitals filed for this visit.  Visit Diagnosis:  Left hip pain  Difficulty walking  Swelling of limb  Pain of lower extremity, unspecified laterality  Hamstring tightness of both lower extremities  Knee stiffness,  right  Right knee pain      Subjective Assessment - 03/27/15 1016    Subjective "The knees are still stiff, Ive got pain gown my leg"   Currently in Pain? Yes   Pain Score 3    Pain Location Leg   Pain Orientation Left                         [facility] Adult PT Treatment/Exercise - 03/27/15 0001    Knee/Hip Exercises: Aerobic   Nustep Level 2 x 7 minutes   Other Aerobic UBE level 4 x 2 minutes   Knee/Hip Exercises: Machines for Strengthening   Cybex Knee Extension 5# 2x15   Cybex Knee Flexion 25# 2x15   Other Machine seated row 25#; lats 20# 2x15 each   Knee/Hip Exercises: Standing   Hip Abduction 2 sets;10 reps;Left   Hip Extension 2 sets;10 reps;Left   Other Standing Knee Exercises Standing march with LLE 2x10   Knee/Hip Exercises: Seated   Ball Squeeze 2 x15 times   Abduction/Adduction  Strengthening;Both;20 reps;15 reps;2 sets   Abd/Adduction Limitations Manual resistance                   PT Short Term Goals - 03/27/15 1026  PT SHORT TERM GOAL #1   Title independent with HEP   Status Achieved           PT Long Term Goals - 03/27/15 1026    PT LONG TERM GOAL #1   Title independent with HEP    Status Partially Met   PT LONG TERM GOAL #2   Title report pain < 4/10 with activity for improved function and mobility    Status Partially Met   PT LONG TERM GOAL #3   Title walk with minimal to no deviation   Status Partially Met               Plan - 03/27/15 1105    Clinical Impression Statement Pt does report :L hip pain this day. Pt able to progress and perform hip exercises in standing position. Pt does continue to fatigues easily with activity. No pain with standing marches with LLE.   Pt will benefit from skilled therapeutic intervention in order to improve on the following deficits Abnormal gait;Cardiopulmonary status limiting activity;Decreased activity tolerance;Decreased balance;Decreased mobility;Decreased range of  motion;Decreased strength;Increased edema;Difficulty walking;Increased muscle spasms;Impaired flexibility;Improper body mechanics;Pain   PT Next Visit Plan continue to add exercises in standing  and flexibility        Problem List Patient Active Problem List   Diagnosis Date Noted  . Arthritis of right knee 02/10/2014  . Primary osteoarthritis of right knee 02/09/2014  . Arthritis of knee, left 09/25/2013  . Arthritis of knee 09/25/2013    Scot Jun, PTA  03/27/2015, 11:09 AM  [place], [place], [address] Phone: [phone]   Fax:  [phone]  Name: Paige [name] MRN: [id number] Date of Birth: 01-13-1946

## 2015-03-31 ENCOUNTER — Encounter: Payer: Self-pay | Admitting: Physical Therapy

## 2015-03-31 ENCOUNTER — Ambulatory Visit: Payer: Medicare Other | Admitting: Physical Therapy

## 2015-03-31 DIAGNOSIS — M25552 Pain in left hip: Secondary | ICD-10-CM

## 2015-03-31 DIAGNOSIS — M79606 Pain in leg, unspecified: Secondary | ICD-10-CM

## 2015-03-31 DIAGNOSIS — R262 Difficulty in walking, not elsewhere classified: Secondary | ICD-10-CM

## 2015-03-31 DIAGNOSIS — M629 Disorder of muscle, unspecified: Secondary | ICD-10-CM

## 2015-03-31 DIAGNOSIS — M7989 Other specified soft tissue disorders: Secondary | ICD-10-CM

## 2015-03-31 NOTE — Therapy (Signed)
[place], [place], [address] Phone: [phone]   Fax:  [phone]  Physical Therapy Treatment  Patient Details  Name: Paige [name] MRN: [id number] Date of Birth: 08/30/1945 Referring Provider: C. Gus Height  Encounter Date: 03/31/2015      PT End of Session - 03/31/15 1511    Visit Number 11   Date for PT Re-Evaluation 04/18/15   PT Start Time 3664   PT Stop Time 1514   PT Time Calculation (min) 40 min   Activity Tolerance Patient limited by fatigue   Behavior During Therapy [facility] for tasks assessed/performed      Past Medical History  Diagnosis Date  . Arthritis   . Depression   . Hypertension     not being treated  . Restless legs syndrome   . Lymphedema of both lower extremities   . GERD (gastroesophageal reflux disease)   . H/O hiatal hernia   . Anemia   . PONV (postoperative nausea and vomiting)     after Cholecysectomy  . Vertigo     Past Surgical History  Procedure Laterality Date  . Cholecystectomy    . Abdominal hysterectomy    . Eye surgery Bilateral     cataracts  . Fracture surgery Right     arm age 48  . Fracture surgery Bilateral     both thumbs  . Colonoscopy    . Total knee arthroplasty Left 09/25/2013    Procedure: TOTAL KNEE ARTHROPLASTY;  Surgeon: Kerin Salen, MD;  Location: [place];  Service: Orthopedics;  Laterality: Left;  . Laparoscopic gastric banding      2010  . Total knee arthroplasty Right 02/10/2014    dr Mayer Camel  . Total knee arthroplasty Right 02/10/2014    Procedure: TOTAL KNEE ARTHROPLASTY;  Surgeon: Kerin Salen, MD;  Location: [place];  Service: Orthopedics;  Laterality: Right;    There were no vitals filed for this visit.  Visit Diagnosis:  Left hip pain  Difficulty walking  Swelling of limb  Pain of lower extremity, unspecified laterality  Hamstring tightness of both lower extremities      Subjective Assessment - 03/31/15  1433    Subjective "Not too bad today" "Knees are good "   Currently in Pain? No/denies   Pain Score 0-No pain                         [facility] Adult PT Treatment/Exercise - 03/31/15 0001    Knee/Hip Exercises: Aerobic   Nustep Level 4 x 7 minutes   Other Aerobic UBE level 4 x 2 minutes   Knee/Hip Exercises: Machines for Strengthening   Cybex Knee Extension 5# 2x15   Cybex Knee Flexion 25# 2x15   Other Machine seated row 25#; lats 20# 2x15 each   Knee/Hip Exercises: Standing   Hip Abduction 10 reps;Both;1 set   Hip Extension 10 reps;1 set;Both   Other Standing Knee Exercises Standing march alt 2x10 #2    Knee/Hip Exercises: Seated   Sit to Sand 3 sets;5 reps;without UE support  elevated UBE seat                   PT Short Term Goals - 03/27/15 1026    PT SHORT TERM GOAL #1   Title independent with HEP   Status Achieved           PT Long Term Goals -  03/27/15 1026    PT LONG TERM GOAL #1   Title independent with HEP    Status Partially Met   PT LONG TERM GOAL #2   Title report pain < 4/10 with activity for improved function and mobility    Status Partially Met   PT LONG TERM GOAL #3   Title walk with minimal to no deviation   Status Partially Met               Plan - 03/31/15 1512    Clinical Impression Statement Pt reports no pain today, but does reports some L knee pain at random times during treatment. Pt progressed to resisted marching, shows carryover from previous treatment with standing hip AROM activities. Continues to fatigue with activity.    Pt will benefit from skilled therapeutic intervention in order to improve on the following deficits Abnormal gait;Cardiopulmonary status limiting activity;Decreased activity tolerance;Decreased balance;Decreased mobility;Decreased range of motion;Decreased strength;Increased edema;Difficulty walking;Increased muscle spasms;Impaired flexibility;Improper body mechanics;Pain   Rehab Potential  Good   PT Frequency 2x / week   PT Duration 8 weeks   PT Treatment/Interventions Electrical Stimulation;Cryotherapy;Moist Heat;Iontophoresis 48m/ml Dexamethasone;Therapeutic exercise;Therapeutic activities;Functional mobility training;Gait training;Ultrasound;Patient/family education;Balance training;Manual techniques;Passive range of motion   PT Next Visit Plan continue to add exercises in standing  and flexibility        Problem List Patient Active Problem List   Diagnosis Date Noted  . Arthritis of right knee 02/10/2014  . Primary osteoarthritis of right knee 02/09/2014  . Arthritis of knee, left 09/25/2013  . Arthritis of knee 09/25/2013    RScot Jun PTA  03/31/2015, 3:15 PM  [place] [address]Phone: [phone]  Fax:  [phone] Name: Paige BRZOSKAMRN: [id number] of Birth: [birth year]

## 2015-04-02 DIAGNOSIS — Z0000 Encounter for general adult medical examination without abnormal findings: Secondary | ICD-10-CM | POA: Diagnosis not present

## 2015-04-02 DIAGNOSIS — R350 Frequency of micturition: Secondary | ICD-10-CM | POA: Diagnosis not present

## 2015-04-02 DIAGNOSIS — N3946 Mixed incontinence: Secondary | ICD-10-CM | POA: Diagnosis not present

## 2015-04-03 ENCOUNTER — Encounter: Payer: Self-pay | Admitting: Physical Therapy

## 2015-04-03 ENCOUNTER — Ambulatory Visit: Payer: Medicare Other | Admitting: Physical Therapy

## 2015-04-03 DIAGNOSIS — M7989 Other specified soft tissue disorders: Secondary | ICD-10-CM

## 2015-04-03 DIAGNOSIS — R262 Difficulty in walking, not elsewhere classified: Secondary | ICD-10-CM

## 2015-04-03 DIAGNOSIS — M25552 Pain in left hip: Secondary | ICD-10-CM

## 2015-04-03 DIAGNOSIS — M629 Disorder of muscle, unspecified: Secondary | ICD-10-CM

## 2015-04-03 DIAGNOSIS — M79606 Pain in leg, unspecified: Secondary | ICD-10-CM

## 2015-04-03 NOTE — Therapy (Addendum)
[employer] [place], [place], [address] Phone: [phone]   Fax:  [phone]  Physical Therapy Treatment  Patient Details  Name: Paige [name] MRN: [id number] Date of Birth: Dec 11, 1945 Referring Provider: C. Gus Height  Encounter Date: 04/03/2015      PT End of Session - 04/03/15 1059    Visit Number 12   Date for PT Re-Evaluation 04/18/15   PT Start Time 1018   PT Stop Time 1102   PT Time Calculation (min) 44 min   Activity Tolerance Patient limited by fatigue   Behavior During Therapy [facility] for tasks assessed/performed      Past Medical History  Diagnosis Date  . Arthritis   . Depression   . Hypertension     not being treated  . Restless legs syndrome   . Lymphedema of both lower extremities   . GERD (gastroesophageal reflux disease)   . H/O hiatal hernia   . Anemia   . PONV (postoperative nausea and vomiting)     after Cholecysectomy  . Vertigo     Past Surgical History  Procedure Laterality Date  . Cholecystectomy    . Abdominal hysterectomy    . Eye surgery Bilateral     cataracts  . Fracture surgery Right     arm age 41  . Fracture surgery Bilateral     both thumbs  . Colonoscopy    . Total knee arthroplasty Left 09/25/2013    Procedure: TOTAL KNEE ARTHROPLASTY;  Surgeon: Kerin Salen, MD;  Location: [place];  Service: Orthopedics;  Laterality: Left;  . Laparoscopic gastric banding      2010  . Total knee arthroplasty Right 02/10/2014    dr Mayer Camel  . Total knee arthroplasty Right 02/10/2014    Procedure: TOTAL KNEE ARTHROPLASTY;  Surgeon: Kerin Salen, MD;  Location: [place];  Service: Orthopedics;  Laterality: Right;    There were no vitals filed for this visit.  Visit Diagnosis:  Left hip pain  Difficulty walking  Swelling of limb  Pain of lower extremity, unspecified laterality  Hamstring tightness of both lower extremities      Subjective Assessment - 04/03/15  1022    Subjective "not too bad"   Currently in Pain? No/denies   Pain Score 0-No pain            [facility] PT Assessment - 04/03/15 0001    Strength   Overall Strength Comments left hip 4+/5 for flexion with groin pain, abduction and adduction 5/5, extension 5/5 with some pain.                     [facility] PT Treatment/Exercise - 04/03/15 0001    Knee/Hip Exercises: Aerobic   Nustep Level 4 x 7 minutes   Other Aerobic UBE level 4 x 5 minutes   Knee/Hip Exercises: Machines for Strengthening   Cybex Knee Extension 5# 2x15   Cybex Knee Flexion 35# 2x10   Other Machine seated row 25#; lats 25# 2x15 each   Knee/Hip Exercises: Standing   Hip Abduction 10 reps;Both;2 sets   Hip Extension 10 reps;Both;2 sets   Other Standing Knee Exercises Standing march alt 2x10                    PT Short Term Goals - 03/27/15 1026    PT SHORT TERM GOAL #1   Title independent with HEP   Status Achieved  PT Long Term Goals - 04/03/15 1043    PT LONG TERM GOAL #1   Title independent with HEP    Status Achieved   PT LONG TERM GOAL #4   Title beable to put on pants without difficulty   Status On-going   PT LONG TERM GOAL #5   Baseline pain at a 6    Status Partially Met               Plan - 04/03/15 1059    Clinical Impression Statement Pt has increase R hip strength. PT has also progressed and completed some goals.Increased activity tolerance with standing interventions.    Pt will benefit from skilled therapeutic intervention in order to improve on the following deficits Abnormal gait;Cardiopulmonary status limiting activity;Decreased activity tolerance;Decreased balance;Decreased mobility;Decreased range of motion;Decreased strength;Increased edema;Difficulty walking;Increased muscle spasms;Impaired flexibility;Improper body mechanics;Pain   Rehab Potential Good   PT Frequency 2x / week   PT Duration 8 weeks   PT Treatment/Interventions  Electrical Stimulation;Cryotherapy;Moist Heat;Iontophoresis 2m/ml Dexamethasone;Therapeutic exercise;Therapeutic activities;Functional mobility training;Gait training;Ultrasound;Patient/family education;Balance training;Manual techniques;Passive range of motion   PT Next Visit Plan continue to add exercises in standing  and flexibility        Problem List Patient Active Problem List   Diagnosis Date Noted  . Arthritis of right knee 02/10/2014  . Primary osteoarthritis of right knee 02/09/2014  . Arthritis of knee, left 09/25/2013  . Arthritis of knee 09/25/2013    PHYSICAL THERAPY DISCHARGE SUMMARY  Visits from Start of Care: 12   Plan: Patient agrees to discharge.  Patient goals were partially met. Patient is being discharged due to being pleased with the current functional level.  ?????       RScot Jun PTA  04/03/2015, 11:01 AM  [place] [address]Phone: [phone]  Fax:  [phone] Name: RYOMARA TOOTHMANMRN: [id number] of Birth: [birth year]

## 2015-04-09 DIAGNOSIS — G2581 Restless legs syndrome: Secondary | ICD-10-CM | POA: Diagnosis not present

## 2015-04-09 DIAGNOSIS — R21 Rash and other nonspecific skin eruption: Secondary | ICD-10-CM | POA: Diagnosis not present

## 2015-04-09 DIAGNOSIS — G479 Sleep disorder, unspecified: Secondary | ICD-10-CM | POA: Diagnosis not present

## 2015-04-09 DIAGNOSIS — Z0000 Encounter for general adult medical examination without abnormal findings: Secondary | ICD-10-CM | POA: Diagnosis not present

## 2015-04-09 DIAGNOSIS — F419 Anxiety disorder, unspecified: Secondary | ICD-10-CM | POA: Diagnosis not present

## 2015-04-09 DIAGNOSIS — R609 Edema, unspecified: Secondary | ICD-10-CM | POA: Diagnosis not present

## 2015-04-09 DIAGNOSIS — E559 Vitamin D deficiency, unspecified: Secondary | ICD-10-CM | POA: Diagnosis not present

## 2015-04-14 DIAGNOSIS — M160 Bilateral primary osteoarthritis of hip: Secondary | ICD-10-CM | POA: Diagnosis not present

## 2015-04-16 DIAGNOSIS — L719 Rosacea, unspecified: Secondary | ICD-10-CM | POA: Diagnosis not present

## 2015-05-04 DIAGNOSIS — N3946 Mixed incontinence: Secondary | ICD-10-CM | POA: Diagnosis not present

## 2015-05-04 DIAGNOSIS — R350 Frequency of micturition: Secondary | ICD-10-CM | POA: Diagnosis not present

## 2015-05-21 DIAGNOSIS — F419 Anxiety disorder, unspecified: Secondary | ICD-10-CM | POA: Diagnosis not present

## 2015-05-21 DIAGNOSIS — R609 Edema, unspecified: Secondary | ICD-10-CM | POA: Diagnosis not present

## 2015-05-21 DIAGNOSIS — E559 Vitamin D deficiency, unspecified: Secondary | ICD-10-CM | POA: Diagnosis not present

## 2015-08-12 DIAGNOSIS — M5416 Radiculopathy, lumbar region: Secondary | ICD-10-CM | POA: Diagnosis not present

## 2015-09-01 DIAGNOSIS — M5416 Radiculopathy, lumbar region: Secondary | ICD-10-CM | POA: Diagnosis not present

## 2015-09-03 DIAGNOSIS — H26491 Other secondary cataract, right eye: Secondary | ICD-10-CM | POA: Diagnosis not present

## 2015-09-03 DIAGNOSIS — Z961 Presence of intraocular lens: Secondary | ICD-10-CM | POA: Diagnosis not present

## 2015-09-03 DIAGNOSIS — H04123 Dry eye syndrome of bilateral lacrimal glands: Secondary | ICD-10-CM | POA: Diagnosis not present

## 2015-09-22 DIAGNOSIS — M160 Bilateral primary osteoarthritis of hip: Secondary | ICD-10-CM | POA: Diagnosis not present

## 2015-09-24 DIAGNOSIS — Z96651 Presence of right artificial knee joint: Secondary | ICD-10-CM | POA: Diagnosis not present

## 2015-09-24 DIAGNOSIS — M1711 Unilateral primary osteoarthritis, right knee: Secondary | ICD-10-CM | POA: Diagnosis not present

## 2015-09-24 DIAGNOSIS — M1712 Unilateral primary osteoarthritis, left knee: Secondary | ICD-10-CM | POA: Diagnosis not present

## 2015-09-24 DIAGNOSIS — M1612 Unilateral primary osteoarthritis, left hip: Secondary | ICD-10-CM | POA: Diagnosis not present

## 2015-09-24 DIAGNOSIS — Z96653 Presence of artificial knee joint, bilateral: Secondary | ICD-10-CM | POA: Diagnosis not present

## 2015-09-24 DIAGNOSIS — Z96652 Presence of left artificial knee joint: Secondary | ICD-10-CM | POA: Diagnosis not present

## 2015-09-24 DIAGNOSIS — Z09 Encounter for follow-up examination after completed treatment for conditions other than malignant neoplasm: Secondary | ICD-10-CM | POA: Diagnosis not present

## 2015-09-24 DIAGNOSIS — M170 Bilateral primary osteoarthritis of knee: Secondary | ICD-10-CM | POA: Diagnosis not present

## 2015-11-10 ENCOUNTER — Other Ambulatory Visit: Payer: Self-pay | Admitting: Orthopedic Surgery

## 2015-11-19 ENCOUNTER — Encounter (HOSPITAL_COMMUNITY)
Admission: RE | Admit: 2015-11-19 | Discharge: 2015-11-19 | Disposition: A | Discharge status: Home / Self Care | Payer: Medicare Other | Source: Ambulatory Visit | Attending: Orthopedic Surgery | Admitting: Orthopedic Surgery

## 2015-11-19 ENCOUNTER — Encounter (HOSPITAL_COMMUNITY): Payer: Self-pay

## 2015-11-19 DIAGNOSIS — Z01810 Encounter for preprocedural cardiovascular examination: Secondary | ICD-10-CM | POA: Diagnosis not present

## 2015-11-19 DIAGNOSIS — M1612 Unilateral primary osteoarthritis, left hip: Secondary | ICD-10-CM | POA: Insufficient documentation

## 2015-11-19 DIAGNOSIS — Z01818 Encounter for other preprocedural examination: Secondary | ICD-10-CM

## 2015-11-19 LAB — PROTIME-INR
INR: 1.01
PROTHROMBIN TIME: 13.3 s (ref 11.4–15.2)

## 2015-11-19 LAB — URINALYSIS, ROUTINE W REFLEX MICROSCOPIC
Bilirubin Urine: NEGATIVE
Glucose, UA: NEGATIVE mg/dL
Hgb urine dipstick: NEGATIVE
Ketones, ur: NEGATIVE mg/dL
LEUKOCYTES UA: NEGATIVE
NITRITE: NEGATIVE
PH: 7.5 (ref 5.0–8.0)
Protein, ur: NEGATIVE mg/dL
SPECIFIC GRAVITY, URINE: 1.018 (ref 1.005–1.030)

## 2015-11-19 LAB — CBC WITH DIFFERENTIAL/PLATELET
BASOS PCT: 1 %
Basophils Absolute: 0.1 10*3/uL (ref 0.0–0.1)
EOS ABS: 0.1 10*3/uL (ref 0.0–0.7)
EOS PCT: 1 %
HCT: 47.8 % — ABNORMAL HIGH (ref 36.0–46.0)
Hemoglobin: 16 g/dL — ABNORMAL HIGH (ref 12.0–15.0)
LYMPHS ABS: 1 10*3/uL (ref 0.7–4.0)
Lymphocytes Relative: 13 %
MCH: 32.3 pg (ref 26.0–34.0)
MCHC: 33.5 g/dL (ref 30.0–36.0)
MCV: 96.4 fL (ref 78.0–100.0)
Monocytes Absolute: 0.4 10*3/uL (ref 0.1–1.0)
Monocytes Relative: 5 %
Neutro Abs: 6.3 10*3/uL (ref 1.7–7.7)
Neutrophils Relative %: 80 %
PLATELETS: 188 10*3/uL (ref 150–400)
RBC: 4.96 MIL/uL (ref 3.87–5.11)
RDW: 13.2 % (ref 11.5–15.5)
WBC: 7.8 10*3/uL (ref 4.0–10.5)

## 2015-11-19 LAB — BASIC METABOLIC PANEL
Anion gap: 7 (ref 5–15)
BUN: 18 mg/dL (ref 6–20)
CALCIUM: 9.7 mg/dL (ref 8.9–10.3)
CO2: 28 mmol/L (ref 22–32)
CREATININE: 0.98 mg/dL (ref 0.44–1.00)
Chloride: 104 mmol/L (ref 101–111)
GFR calc Af Amer: >60 mL/min (ref >60)
GFR calc non Af Amer: 57 mL/min — ABNORMAL LOW (ref >60)
Glucose, Bld: 94 mg/dL (ref 65–99)
Potassium: 4.4 mmol/L (ref 3.5–5.1)
SODIUM: 139 mmol/L (ref 135–145)

## 2015-11-19 LAB — SURGICAL PCR SCREEN
MRSA, PCR: NEGATIVE
STAPHYLOCOCCUS AUREUS: POSITIVE — AB

## 2015-11-19 LAB — TYPE AND SCREEN
ABO/RH(D): O POS
Antibody Screen: NEGATIVE

## 2015-11-19 LAB — APTT: APTT: 29 s (ref 24–36)

## 2015-11-19 NOTE — Pre-Procedure Instructions (Addendum)
Paige [name]  11/19/2015      [id number] - [place], [place] - [address] AT [facility] OF [address] [place] [address] Phone: [phone] Fax: [phone]    Your procedure is scheduled on   Monday  11/30/15  Report to [facility] Admitting at 8:00 A.M.  Call this number if you have problems the morning of surgery:  [phone]   Remember:  Do not eat food or drink liquids after midnight on Sunday Nov. 5   Take these medicines the morning of surgery with A SIP OF WATER   BUPROPION (WELLBUTRIN), PAROXETINE (PAXIL), PRAMIPEXOLE (MIRAPEX)              (STOP 7 DAYS PRIOR TO SURGERY -herbal/vitamins,advil, motrin, aleve,ibuprofen,Bc Powders, Goody's.   Do not wear jewelry, make-up or nail polish.  Do not wear lotions, powders, or perfumes, or deoderant.  Do not shave 48 hours prior to surgery.  Men may shave face and neck.  Do not bring valuables to the hospital.  [facility] is not responsible for any belongings or valuables.  Contacts, dentures or bridgework may not be worn into surgery.  Leave your suitcase in the car.  After surgery it may be brought to your room.  For patients admitted to the hospital, discharge time will be determined by your treatment team.  Patients discharged the day of surgery will not be allowed to drive home.   Name and phone number of your driver:    Special instructions:  [facility] - Preparing for Surgery  Before surgery, you can play an important role.  Because skin is not sterile, your skin needs to be as free of germs as possible.  You can reduce the number of germs on you skin by washing with CHG (chlorahexidine gluconate) soap before surgery.  CHG is an antiseptic cleaner which kills germs and bonds with the skin to continue killing germs even after washing.  Please DO NOT use if you have an allergy to CHG or antibacterial soaps.  If your skin becomes reddened/irritated stop using  the CHG and inform your nurse when you arrive at Short Stay.  Do not shave (including legs and underarms) for at least 48 hours prior to the first CHG shower.  You may shave your face.  Please follow these instructions carefully:   1.  Shower with CHG Soap the night before surgery and the                                morning of Surgery.  2.  If you choose to wash your hair, wash your hair first as usual with your       normal shampoo.  3.  After you shampoo, rinse your hair and body thoroughly to remove the                      Shampoo.  4.  Use CHG as you would any other liquid soap.  You can apply chg directly       to the skin and wash gently with scrungie or a clean washcloth.  5.  Apply the CHG Soap to your body ONLY FROM THE NECK DOWN.        Do not use on open wounds or open sores.  Avoid contact with your eyes,  ears, mouth and genitals (private parts).  Wash genitals (private parts)       with your normal soap.  6.  Wash thoroughly, paying special attention to the area where your surgery        will be performed.  7.  Thoroughly rinse your body with warm water from the neck down.  8.  DO NOT shower/wash with your normal soap after using and rinsing off       the CHG Soap.  9.  Pat yourself dry with a clean towel.            10.  Wear clean pajamas.            11.  Place clean sheets on your bed the night of your first shower and do not        sleep with pets.  Day of Surgery  Do not apply any lotions/deoderants the morning of surgery.  Please wear clean clothes to the hospital/surgery center.    Please read over the following fact sheets that you were given. MRSA Information and Surgical Site Infection Prevention

## 2015-11-19 NOTE — Progress Notes (Addendum)
PCP: C.Leanor Kail @ [facility] No cardiologist  States had a sleep study 9 yrs. Ago. Negative per pt.  Called mupirocin prescription to [facility] on [address].

## 2015-11-26 DIAGNOSIS — M1612 Unilateral primary osteoarthritis, left hip: Secondary | ICD-10-CM

## 2015-11-26 HISTORY — DX: Unilateral primary osteoarthritis, left hip: M16.12

## 2015-11-26 NOTE — H&P (Signed)
TOTAL HIP ADMISSION H&P  Patient is admitted for left total hip arthroplasty.  Subjective:  Chief Complaint: left hip pain  HPI: Paige [name], 70 y.o. female, has a history of pain and functional disability in the left hip(s) due to arthritis and patient has failed non-surgical conservative treatments for greater than 12 weeks to include NSAID's and/or analgesics, flexibility and strengthening excercises, use of assistive devices, weight reduction as appropriate and activity modification.  Onset of symptoms was gradual starting 2 years ago with gradually worsening course since that time.The patient noted no past surgery on the left hip(s).  Patient currently rates pain in the left hip at 10 out of 10 with activity. Patient has night pain, worsening of pain with activity and weight bearing, pain that interfers with activities of daily living and pain with passive range of motion. Patient has evidence of joint space narrowing by imaging studies. This condition presents safety issues increasing the risk of falls.   There is no current active infection.  Patient Active Problem List   Diagnosis Date Noted  . Primary osteoarthritis of left hip 11/26/2015  . Arthritis of right knee 02/10/2014  . Primary osteoarthritis of right knee 02/09/2014  . Arthritis of knee, left 09/25/2013  . Arthritis of knee 09/25/2013   Past Medical History:  Diagnosis Date  . Anemia   . Arthritis   . Depression   . GERD (gastroesophageal reflux disease)   . H/O hiatal hernia   . Hypertension    not being treated  . Lymphedema of both lower extremities   . PONV (postoperative nausea and vomiting)    after Cholecysectomy  . Restless legs syndrome   . Vertigo     Past Surgical History:  Procedure Laterality Date  . ABDOMINAL HYSTERECTOMY    . CHOLECYSTECTOMY    . COLONOSCOPY    . EYE SURGERY Bilateral    cataracts  . FRACTURE SURGERY Right    arm age 58  . FRACTURE SURGERY Bilateral    both thumbs  .  LAPAROSCOPIC GASTRIC BANDING     2010  . TOTAL KNEE ARTHROPLASTY Left 09/25/2013   Procedure: TOTAL KNEE ARTHROPLASTY;  Surgeon: Nestor Lewandowsky, MD;  Location: [facility];  Service: Orthopedics;  Laterality: Left;  . TOTAL KNEE ARTHROPLASTY Right 02/10/2014   dr Turner Daniels  . TOTAL KNEE ARTHROPLASTY Right 02/10/2014   Procedure: TOTAL KNEE ARTHROPLASTY;  Surgeon: Nestor Lewandowsky, MD;  Location: [facility];  Service: Orthopedics;  Laterality: Right;    No prescriptions prior to admission.   No Known Allergies  Social History  Substance Use Topics  . Smoking status: Never Smoker  . Smokeless tobacco: Never Used  . Alcohol use Yes     Comment: rarely    No family history on file.   Review of Systems  Constitutional: Positive for malaise/fatigue.  HENT: Negative.   Eyes: Negative.   Cardiovascular: Positive for leg swelling.  Gastrointestinal: Positive for diarrhea and heartburn.  Genitourinary:       Poor bladder control  Musculoskeletal: Positive for joint pain and myalgias.  Skin: Negative.   Neurological: Positive for dizziness, tremors and focal weakness.       Poor balance  Endo/Heme/Allergies: Negative.   Psychiatric/Behavioral: Positive for depression.    Objective:  Physical Exam  Constitutional: She is oriented to person, place, and time. She appears well-developed and well-nourished.  HENT:  Head: Normocephalic and atraumatic.  Eyes: Pupils are equal, round, and reactive to light.  Neck: Normal  range of motion. Neck supple.  Cardiovascular: Intact distal pulses.   Respiratory: Effort normal.  Musculoskeletal: She exhibits tenderness.  The left hip is highly irritable to internal rotation.  Foot tap is mildly positive  Neurological: She is alert and oriented to person, place, and time.  Skin: Skin is warm and dry.  Psychiatric: She has a normal mood and affect. Her behavior is normal. Judgment and thought content normal.    Vital signs in last 24 hours:     Labs:   Estimated body mass index is 41.63 kg/m as calculated from the following:   Height as of 11/19/15: 5' 8.25" (1.734 m).   Weight as of 11/19/15: 125.1 kg (275 lb 12.8 oz).   Imaging Review Plain radiographs demonstrate  AP pelvis and crosstable lateral of the left hip show progression with bone-on-bone changes and early flattening of the femoral head.  AP, lateral and sunrise x-rays of the total knee show well-placed well fixed DePuy Sigma RP cemented prostheses with long tibial stems.  Assessment/Plan:  End stage arthritis, left hip(s)  The patient history, physical examination, clinical judgement of the provider and imaging studies are consistent with end stage degenerative joint disease of the left hip(s) and total hip arthroplasty is deemed medically necessary. The treatment options including medical management, injection therapy, arthroscopy and arthroplasty were discussed at length. The risks and benefits of total hip arthroplasty were presented and reviewed. The risks due to aseptic loosening, infection, stiffness, dislocation/subluxation,  thromboembolic complications and other imponderables were discussed.  The patient acknowledged the explanation, agreed to proceed with the plan and consent was signed. Patient is being admitted for inpatient treatment for surgery, pain control, PT, OT, prophylactic antibiotics, VTE prophylaxis, progressive ambulation and ADL's and discharge planning.The patient is planning to be discharged home with home health services

## 2015-11-27 MED ORDER — TRANEXAMIC ACID 1000 MG/10ML IV SOLN
2000.0000 mg | Freq: Once | INTRAVENOUS | Status: DC
  Filled 2015-11-27: qty 20

## 2015-11-27 MED ORDER — TRANEXAMIC ACID 1000 MG/10ML IV SOLN
2000.0000 mg | Freq: Once | INTRAVENOUS | Status: AC
  Administered 2015-11-30: 2000 mg via TOPICAL
  Filled 2015-11-27: qty 20

## 2015-11-27 MED ORDER — BUPIVACAINE LIPOSOME 1.3 % IJ SUSP
20.0000 mL | Freq: Once | INTRAMUSCULAR | Status: AC
  Administered 2015-11-30: 20 mL
  Filled 2015-11-27: qty 20

## 2015-11-27 MED ORDER — DEXTROSE 5 % IV SOLN
3.0000 g | INTRAVENOUS | Status: AC
  Administered 2015-11-30: 3 g via INTRAVENOUS
  Filled 2015-11-27: qty 3000

## 2015-11-29 NOTE — Anesthesia Preprocedure Evaluation (Signed)
Anesthesia Evaluation  Patient identified by MRN, date of birth, ID band Patient awake    Reviewed: Allergy & Precautions, NPO status , Patient's Chart, lab work & pertinent test results, reviewed documented beta blocker date and time   Airway Mallampati: II   Neck ROM: Full    Dental  (+) Dental Advisory Given,    Pulmonary neg pulmonary ROS,    breath sounds clear to auscultation       Cardiovascular hypertension, On Medications  Rhythm:Regular  EKG 08/2013 WNL   Neuro/Psych    GI/Hepatic GERD  Medicated,  Endo/Other  Morbid obesity  Renal/GU      Musculoskeletal   Abdominal (+) + obese,   Peds  Hematology 15/46 H/H   Anesthesia Other Findings   Reproductive/Obstetrics                             Anesthesia Physical  Anesthesia Plan  ASA: II  Anesthesia Plan: General   Post-op Pain Management:  Regional for Post-op pain   Induction: Intravenous  Airway Management Planned: Oral ETT  Additional Equipment:   Intra-op Plan:   Post-operative Plan: Extubation in OR  Informed Consent: I have reviewed the patients History and Physical, chart, labs and discussed the procedure including the risks, benefits and alternatives for the proposed anesthesia with the patient or authorized representative who has indicated his/her understanding and acceptance.     Plan Discussed with:   Anesthesia Plan Comments:         Anesthesia Quick Evaluation

## 2015-11-30 ENCOUNTER — Inpatient Hospital Stay (HOSPITAL_COMMUNITY): Payer: Medicare Other

## 2015-11-30 ENCOUNTER — Encounter (HOSPITAL_COMMUNITY)
Admission: RE | Disposition: A | Discharge status: Skilled Nursing Facility | Payer: Self-pay | Source: Ambulatory Visit | Attending: Orthopedic Surgery

## 2015-11-30 ENCOUNTER — Inpatient Hospital Stay (HOSPITAL_COMMUNITY): Payer: Medicare Other | Admitting: Anesthesiology

## 2015-11-30 ENCOUNTER — Inpatient Hospital Stay (HOSPITAL_COMMUNITY)
Admission: RE | Admit: 2015-11-30 | Discharge: 2015-12-02 | DRG: 470 | Disposition: A | Discharge status: Skilled Nursing Facility | Payer: Medicare Other | Source: Ambulatory Visit | Attending: Orthopedic Surgery | Admitting: Orthopedic Surgery

## 2015-11-30 DIAGNOSIS — M1990 Unspecified osteoarthritis, unspecified site: Secondary | ICD-10-CM | POA: Diagnosis not present

## 2015-11-30 DIAGNOSIS — D62 Acute posthemorrhagic anemia: Secondary | ICD-10-CM | POA: Diagnosis not present

## 2015-11-30 DIAGNOSIS — I10 Essential (primary) hypertension: Secondary | ICD-10-CM | POA: Diagnosis not present

## 2015-11-30 DIAGNOSIS — F329 Major depressive disorder, single episode, unspecified: Secondary | ICD-10-CM | POA: Diagnosis present

## 2015-11-30 DIAGNOSIS — Z96642 Presence of left artificial hip joint: Secondary | ICD-10-CM | POA: Diagnosis not present

## 2015-11-30 DIAGNOSIS — G2581 Restless legs syndrome: Secondary | ICD-10-CM | POA: Diagnosis present

## 2015-11-30 DIAGNOSIS — Z419 Encounter for procedure for purposes other than remedying health state, unspecified: Secondary | ICD-10-CM

## 2015-11-30 DIAGNOSIS — R262 Difficulty in walking, not elsewhere classified: Secondary | ICD-10-CM

## 2015-11-30 DIAGNOSIS — Z9849 Cataract extraction status, unspecified eye: Secondary | ICD-10-CM | POA: Diagnosis not present

## 2015-11-30 DIAGNOSIS — Z9089 Acquired absence of other organs: Secondary | ICD-10-CM | POA: Diagnosis not present

## 2015-11-30 DIAGNOSIS — Z471 Aftercare following joint replacement surgery: Secondary | ICD-10-CM | POA: Diagnosis not present

## 2015-11-30 DIAGNOSIS — K219 Gastro-esophageal reflux disease without esophagitis: Secondary | ICD-10-CM | POA: Diagnosis present

## 2015-11-30 DIAGNOSIS — M129 Arthropathy, unspecified: Secondary | ICD-10-CM | POA: Diagnosis not present

## 2015-11-30 DIAGNOSIS — M1612 Unilateral primary osteoarthritis, left hip: Principal | ICD-10-CM | POA: Diagnosis present

## 2015-11-30 DIAGNOSIS — Z6841 Body Mass Index (BMI) 40.0 and over, adult: Secondary | ICD-10-CM

## 2015-11-30 DIAGNOSIS — M25569 Pain in unspecified knee: Secondary | ICD-10-CM | POA: Diagnosis not present

## 2015-11-30 DIAGNOSIS — Z96653 Presence of artificial knee joint, bilateral: Secondary | ICD-10-CM | POA: Diagnosis present

## 2015-11-30 DIAGNOSIS — M25552 Pain in left hip: Secondary | ICD-10-CM | POA: Diagnosis present

## 2015-11-30 DIAGNOSIS — E669 Obesity, unspecified: Secondary | ICD-10-CM | POA: Diagnosis not present

## 2015-11-30 DIAGNOSIS — D649 Anemia, unspecified: Secondary | ICD-10-CM | POA: Diagnosis not present

## 2015-11-30 DIAGNOSIS — M1711 Unilateral primary osteoarthritis, right knee: Secondary | ICD-10-CM | POA: Diagnosis not present

## 2015-11-30 HISTORY — PX: TOTAL HIP ARTHROPLASTY: SHX124

## 2015-11-30 SURGERY — ARTHROPLASTY, HIP, TOTAL, ANTERIOR APPROACH
Anesthesia: General | Laterality: Left

## 2015-11-30 MED ORDER — MENTHOL 3 MG MT LOZG: 1.0000 | LOZENGE | OROMUCOSAL | Status: DC | PRN

## 2015-11-30 MED ORDER — DIPHENHYDRAMINE HCL 12.5 MG/5ML PO ELIX: 12.5000 mg | ORAL_SOLUTION | ORAL | Status: DC | PRN

## 2015-11-30 MED ORDER — ROCURONIUM BROMIDE 10 MG/ML (PF) SYRINGE
PREFILLED_SYRINGE | INTRAVENOUS | Status: AC
  Filled 2015-11-30: qty 10

## 2015-11-30 MED ORDER — TRAZODONE HCL 50 MG PO TABS
150.0000 mg | ORAL_TABLET | Freq: Every day | ORAL | Status: DC
  Administered 2015-11-30 – 2015-12-01 (×2): 150 mg via ORAL
  Filled 2015-11-30 (×2): qty 1

## 2015-11-30 MED ORDER — BUPIVACAINE HCL (PF) 0.5 % IJ SOLN
INTRAMUSCULAR | Status: AC
  Filled 2015-11-30: qty 30

## 2015-11-30 MED ORDER — PRAMIPEXOLE DIHYDROCHLORIDE 0.125 MG PO TABS
0.2500 mg | ORAL_TABLET | Freq: Two times a day (BID) | ORAL | Status: DC
  Administered 2015-11-30 – 2015-12-02 (×4): 0.25 mg via ORAL
  Filled 2015-11-30 (×4): qty 2

## 2015-11-30 MED ORDER — PHENYLEPHRINE HCL 10 MG/ML IJ SOLN
INTRAVENOUS | Status: DC | PRN
  Administered 2015-11-30: 40 ug/min via INTRAVENOUS

## 2015-11-30 MED ORDER — PROPOFOL 10 MG/ML IV BOLUS
INTRAVENOUS | Status: DC | PRN
  Administered 2015-11-30: 10 mg via INTRAVENOUS

## 2015-11-30 MED ORDER — MAGNESIUM OXIDE 400 (241.3 MG) MG PO TABS
800.0000 mg | ORAL_TABLET | Freq: Every day | ORAL | Status: DC
  Administered 2015-11-30 – 2015-12-01 (×2): 800 mg via ORAL
  Filled 2015-11-30 (×2): qty 2

## 2015-11-30 MED ORDER — BISACODYL 5 MG PO TBEC
5.0000 mg | DELAYED_RELEASE_TABLET | Freq: Every day | ORAL | Status: DC | PRN
Start: 2015-11-30 — End: 2015-12-02

## 2015-11-30 MED ORDER — POLYSACCHARIDE IRON COMPLEX 150 MG PO CAPS
150.0000 mg | ORAL_CAPSULE | Freq: Every day | ORAL | Status: DC
  Administered 2015-12-01 – 2015-12-02 (×2): 150 mg via ORAL
  Filled 2015-11-30 (×3): qty 1

## 2015-11-30 MED ORDER — FLEET ENEMA 7-19 GM/118ML RE ENEM: 1.0000 | ENEMA | Freq: Once | RECTAL | Status: DC | PRN

## 2015-11-30 MED ORDER — BUPIVACAINE HCL (PF) 0.5 % IJ SOLN
INTRAMUSCULAR | Status: DC | PRN
  Administered 2015-11-30: 30 mL

## 2015-11-30 MED ORDER — PHENYLEPHRINE HCL 10 MG/ML IJ SOLN
INTRAMUSCULAR | Status: DC | PRN
  Administered 2015-11-30 (×2): 80 ug via INTRAVENOUS

## 2015-11-30 MED ORDER — HYDROMORPHONE HCL 2 MG/ML IJ SOLN
0.5000 mg | INTRAMUSCULAR | Status: DC | PRN
  Administered 2015-12-01: 1 mg via INTRAVENOUS
  Filled 2015-11-30: qty 1

## 2015-11-30 MED ORDER — ONDANSETRON HCL 4 MG/2ML IJ SOLN: 4.0000 mg | Freq: Four times a day (QID) | INTRAMUSCULAR | Status: DC | PRN

## 2015-11-30 MED ORDER — ONDANSETRON HCL 4 MG/2ML IJ SOLN
INTRAMUSCULAR | Status: DC | PRN
  Administered 2015-11-30: 4 mg via INTRAVENOUS

## 2015-11-30 MED ORDER — ACETAMINOPHEN 325 MG PO TABS: 650.0000 mg | ORAL_TABLET | Freq: Four times a day (QID) | ORAL | Status: DC | PRN

## 2015-11-30 MED ORDER — PAROXETINE HCL 20 MG PO TABS
40.0000 mg | ORAL_TABLET | Freq: Every day | ORAL | Status: DC
  Administered 2015-12-01 – 2015-12-02 (×2): 40 mg via ORAL
  Filled 2015-11-30 (×2): qty 2

## 2015-11-30 MED ORDER — DEXAMETHASONE SODIUM PHOSPHATE 10 MG/ML IJ SOLN
INTRAMUSCULAR | Status: DC | PRN
  Administered 2015-11-30: 10 mg via INTRAVENOUS

## 2015-11-30 MED ORDER — METHOCARBAMOL 500 MG PO TABS
500.0000 mg | ORAL_TABLET | Freq: Four times a day (QID) | ORAL | Status: DC | PRN
  Administered 2015-11-30 – 2015-12-02 (×6): 500 mg via ORAL
  Filled 2015-11-30 (×6): qty 1

## 2015-11-30 MED ORDER — LACTATED RINGERS IV SOLN
Freq: Once | INTRAVENOUS | Status: AC
  Administered 2015-11-30: 10:00:00 via INTRAVENOUS

## 2015-11-30 MED ORDER — DEXTROSE 5 % IV SOLN: 500.0000 mg | Freq: Four times a day (QID) | INTRAVENOUS | Status: DC | PRN

## 2015-11-30 MED ORDER — TRANEXAMIC ACID 1000 MG/10ML IV SOLN
1000.0000 mg | INTRAVENOUS | Status: AC
  Administered 2015-11-30: 1000 mg via INTRAVENOUS
  Filled 2015-11-30: qty 10

## 2015-11-30 MED ORDER — ACETAMINOPHEN 650 MG RE SUPP: 650.0000 mg | Freq: Four times a day (QID) | RECTAL | Status: DC | PRN

## 2015-11-30 MED ORDER — DEXTROSE-NACL 5-0.45 % IV SOLN: INTRAVENOUS | Status: DC

## 2015-11-30 MED ORDER — METOCLOPRAMIDE HCL 5 MG/ML IJ SOLN: 5.0000 mg | Freq: Three times a day (TID) | INTRAMUSCULAR | Status: DC | PRN

## 2015-11-30 MED ORDER — ONDANSETRON HCL 4 MG PO TABS: 4.0000 mg | ORAL_TABLET | Freq: Four times a day (QID) | ORAL | Status: DC | PRN

## 2015-11-30 MED ORDER — ALUMINUM HYDROXIDE GEL 320 MG/5ML PO SUSP
15.0000 mL | ORAL | Status: DC | PRN
Start: 2015-11-30 — End: 2015-12-02

## 2015-11-30 MED ORDER — DEXAMETHASONE SODIUM PHOSPHATE 10 MG/ML IJ SOLN
10.0000 mg | Freq: Once | INTRAMUSCULAR | Status: AC
  Administered 2015-12-01: 10 mg via INTRAVENOUS
  Filled 2015-11-30: qty 1

## 2015-11-30 MED ORDER — KCL IN DEXTROSE-NACL 20-5-0.45 MEQ/L-%-% IV SOLN
INTRAVENOUS | Status: DC
  Administered 2015-11-30: 15:00:00 via INTRAVENOUS
  Filled 2015-11-30: qty 1000

## 2015-11-30 MED ORDER — FENTANYL CITRATE (PF) 100 MCG/2ML IJ SOLN
INTRAMUSCULAR | Status: AC
  Filled 2015-11-30: qty 2

## 2015-11-30 MED ORDER — LIDOCAINE 2% (20 MG/ML) 5 ML SYRINGE
INTRAMUSCULAR | Status: AC
  Filled 2015-11-30: qty 5

## 2015-11-30 MED ORDER — POLYETHYLENE GLYCOL 3350 17 G PO PACK: 17.0000 g | PACK | Freq: Every day | ORAL | Status: DC | PRN

## 2015-11-30 MED ORDER — PHENYLEPHRINE HCL 10 MG/ML IJ SOLN
INTRAMUSCULAR | Status: AC
  Filled 2015-11-30: qty 1

## 2015-11-30 MED ORDER — PHENYLEPHRINE 40 MCG/ML (10ML) SYRINGE FOR IV PUSH (FOR BLOOD PRESSURE SUPPORT)
PREFILLED_SYRINGE | INTRAVENOUS | Status: AC
  Filled 2015-11-30: qty 10

## 2015-11-30 MED ORDER — PHENOL 1.4 % MT LIQD: 1.0000 | OROMUCOSAL | Status: DC | PRN

## 2015-11-30 MED ORDER — FENTANYL CITRATE (PF) 100 MCG/2ML IJ SOLN
INTRAMUSCULAR | Status: AC
  Filled 2015-11-30: qty 4

## 2015-11-30 MED ORDER — CELECOXIB 200 MG PO CAPS
200.0000 mg | ORAL_CAPSULE | Freq: Two times a day (BID) | ORAL | Status: DC
  Administered 2015-11-30 – 2015-12-02 (×5): 200 mg via ORAL
  Filled 2015-11-30 (×5): qty 1

## 2015-11-30 MED ORDER — FENTANYL CITRATE (PF) 100 MCG/2ML IJ SOLN
INTRAMUSCULAR | Status: DC | PRN
Start: 2015-11-30 — End: 2015-11-30
  Administered 2015-11-30 (×2): 50 ug via INTRAVENOUS

## 2015-11-30 MED ORDER — DOCUSATE SODIUM 100 MG PO CAPS
100.0000 mg | ORAL_CAPSULE | Freq: Two times a day (BID) | ORAL | Status: DC
  Administered 2015-11-30 – 2015-12-02 (×5): 100 mg via ORAL
  Filled 2015-11-30 (×5): qty 1

## 2015-11-30 MED ORDER — LACTATED RINGERS IV SOLN
INTRAVENOUS | Status: DC | PRN
  Administered 2015-11-30 (×2): via INTRAVENOUS

## 2015-11-30 MED ORDER — METOCLOPRAMIDE HCL 5 MG PO TABS: 5.0000 mg | ORAL_TABLET | Freq: Three times a day (TID) | ORAL | Status: DC | PRN

## 2015-11-30 MED ORDER — OXYCODONE HCL 5 MG PO TABS
5.0000 mg | ORAL_TABLET | ORAL | Status: DC | PRN
  Administered 2015-11-30 – 2015-12-02 (×10): 10 mg via ORAL
  Filled 2015-11-30 (×10): qty 2

## 2015-11-30 MED ORDER — FENTANYL CITRATE (PF) 100 MCG/2ML IJ SOLN
25.0000 ug | INTRAMUSCULAR | Status: DC | PRN
  Administered 2015-11-30 (×2): 25 ug via INTRAVENOUS

## 2015-11-30 MED ORDER — ONDANSETRON HCL 4 MG/2ML IJ SOLN
INTRAMUSCULAR | Status: AC
  Filled 2015-11-30: qty 2

## 2015-11-30 MED ORDER — PROPOFOL 500 MG/50ML IV EMUL
INTRAVENOUS | Status: DC | PRN
  Administered 2015-11-30: 80 ug/kg/min via INTRAVENOUS

## 2015-11-30 MED ORDER — 0.9 % SODIUM CHLORIDE (POUR BTL) OPTIME
TOPICAL | Status: DC | PRN
  Administered 2015-11-30: 1000 mL

## 2015-11-30 MED ORDER — DEXAMETHASONE SODIUM PHOSPHATE 10 MG/ML IJ SOLN
INTRAMUSCULAR | Status: AC
  Filled 2015-11-30: qty 1

## 2015-11-30 MED ORDER — SODIUM CHLORIDE 0.9 % IJ SOLN
INTRAMUSCULAR | Status: DC | PRN
  Administered 2015-11-30: 10 mL

## 2015-11-30 MED ORDER — TRANEXAMIC ACID 1000 MG/10ML IV SOLN
1000.0000 mg | Freq: Once | INTRAVENOUS | Status: AC
  Administered 2015-11-30: 1000 mg via INTRAVENOUS
  Filled 2015-11-30: qty 10

## 2015-11-30 MED ORDER — PROPOFOL 10 MG/ML IV BOLUS
INTRAVENOUS | Status: AC
  Filled 2015-11-30: qty 20

## 2015-11-30 MED ORDER — ASPIRIN EC 325 MG PO TBEC
325.0000 mg | DELAYED_RELEASE_TABLET | Freq: Every day | ORAL | Status: DC
  Administered 2015-12-01 – 2015-12-02 (×2): 325 mg via ORAL
  Filled 2015-11-30 (×3): qty 1

## 2015-11-30 MED ORDER — CHLORHEXIDINE GLUCONATE 4 % EX LIQD
60.0000 mL | Freq: Once | CUTANEOUS | Status: DC
Start: 2015-11-30 — End: 2015-11-30

## 2015-11-30 SURGICAL SUPPLY — 50 items
BAG DECANTER FOR FLEXI CONT (MISCELLANEOUS) ×2 IMPLANT
BLADE SURG ROTATE 9660 (MISCELLANEOUS) IMPLANT
CAPT HIP TOTAL 2 ×1 IMPLANT
COVER PERINEAL POST (MISCELLANEOUS) ×2 IMPLANT
COVER SURGICAL LIGHT HANDLE (MISCELLANEOUS) ×2 IMPLANT
DRAPE C-ARM 42X72 X-RAY (DRAPES) ×2 IMPLANT
DRAPE STERI IOBAN 125X83 (DRAPES) ×2 IMPLANT
DRAPE U-SHAPE 47X51 STRL (DRAPES) ×4 IMPLANT
DRESSING AQUACEL AG SP 3.5X10 (GAUZE/BANDAGES/DRESSINGS) IMPLANT
DRSG AQUACEL AG ADV 3.5X10 (GAUZE/BANDAGES/DRESSINGS) ×2 IMPLANT
DRSG AQUACEL AG SP 3.5X10 (GAUZE/BANDAGES/DRESSINGS) ×2
DURAPREP 26ML APPLICATOR (WOUND CARE) ×2 IMPLANT
ELECT BLADE 4.0 EZ CLEAN MEGAD (MISCELLANEOUS) ×2
ELECT REM PT RETURN 9FT ADLT (ELECTROSURGICAL) ×2
ELECTRODE BLDE 4.0 EZ CLN MEGD (MISCELLANEOUS) ×1 IMPLANT
ELECTRODE REM PT RTRN 9FT ADLT (ELECTROSURGICAL) ×1 IMPLANT
FACESHIELD WRAPAROUND (MASK) ×4 IMPLANT
FACESHIELD WRAPAROUND OR TEAM (MASK) ×2 IMPLANT
GLOVE BIO SURGEON STRL SZ7.5 (GLOVE) ×2 IMPLANT
GLOVE BIO SURGEON STRL SZ8.5 (GLOVE) ×2 IMPLANT
GLOVE BIOGEL PI IND STRL 8 (GLOVE) ×1 IMPLANT
GLOVE BIOGEL PI IND STRL 9 (GLOVE) ×1 IMPLANT
GLOVE BIOGEL PI INDICATOR 8 (GLOVE) ×1
GLOVE BIOGEL PI INDICATOR 9 (GLOVE) ×1
GOWN STRL REUS W/ TWL LRG LVL3 (GOWN DISPOSABLE) ×1 IMPLANT
GOWN STRL REUS W/ TWL XL LVL3 (GOWN DISPOSABLE) ×2 IMPLANT
GOWN STRL REUS W/TWL LRG LVL3 (GOWN DISPOSABLE) ×2
GOWN STRL REUS W/TWL XL LVL3 (GOWN DISPOSABLE) ×4
KIT BASIN OR (CUSTOM PROCEDURE TRAY) ×2 IMPLANT
KIT ROOM TURNOVER OR (KITS) ×2 IMPLANT
MANIFOLD NEPTUNE II (INSTRUMENTS) ×2 IMPLANT
NDL 22X1.5 STRL (OR ONLY) (MISCELLANEOUS) ×2 IMPLANT
NEEDLE 22X1 1/2 OR ONLY (MISCELLANEOUS) ×2
NEEDLE 22X1.5 STRL (OR ONLY) (MISCELLANEOUS) ×2 IMPLANT
NS IRRIG 1000ML POUR BTL (IV SOLUTION) ×2 IMPLANT
PACK TOTAL JOINT (CUSTOM PROCEDURE TRAY) ×2 IMPLANT
PAD ARMBOARD 7.5X6 YLW CONV (MISCELLANEOUS) ×4 IMPLANT
SAW OSC TIP CART 19.5X105X1.3 (SAW) ×2 IMPLANT
SUT VIC AB 1 CTX 36 (SUTURE) ×2
SUT VIC AB 1 CTX36XBRD ANBCTR (SUTURE) ×1 IMPLANT
SUT VIC AB 2-0 CT1 27 (SUTURE) ×4
SUT VIC AB 2-0 CT1 TAPERPNT 27 (SUTURE) ×2 IMPLANT
SUT VIC AB 3-0 PS2 18 (SUTURE) ×2
SUT VIC AB 3-0 PS2 18XBRD (SUTURE) ×1 IMPLANT
SYR CONTROL 10ML LL (SYRINGE) ×4 IMPLANT
TOWEL OR 17X24 6PK STRL BLUE (TOWEL DISPOSABLE) ×1 IMPLANT
TOWEL OR 17X26 10 PK STRL BLUE (TOWEL DISPOSABLE) ×1 IMPLANT
TRAY CATH 16FR W/PLASTIC CATH (SET/KITS/TRAYS/PACK) ×1 IMPLANT
TRAY FOLEY CATH 14FR (SET/KITS/TRAYS/PACK) IMPLANT
WATER STERILE IRR 1000ML POUR (IV SOLUTION) ×1 IMPLANT

## 2015-11-30 NOTE — Op Note (Signed)
OPERATIVE REPORT    DATE OF PROCEDURE:  11/30/2015       PREOPERATIVE DIAGNOSIS:  LEFT HIP OSTEOARTHRITIS                                                          POSTOPERATIVE DIAGNOSIS:  LEFT HIP OSTEOARTHRITIS                                                           PROCEDURE: Anterior L total hip arthroplasty using a 48 mm DePuy Pinnacle  Cup, Peabody Energypex Hole Eliminator, 0-degree polyethylene liner, a +8 32 mm ceramic head, a #6 Depuy Triloc stem   SURGEON: Paige [name],Adian Jablonowski J    ASSISTANT:   Eric K. Gaylene BrooksPhillips PA-C  (present throughout entire procedure and necessary for timely completion of the procedure)   ANESTHESIA: Spinal BLOOD LOSS: 400 FLUID REPLACEMENT: 1600 crystalloid Antibiotic: 3gm ancef Tranexamic Acid: 2gm topical COMPLICATIONS: none    INDICATIONS FOR PROCEDURE: A 70 y.o. year-old With  LEFT HIP OSTEOARTHRITIS   for 3 years, x-rays show bone-on-bone arthritic changes, and osteophytes. Despite conservative measures with observation, anti-inflammatory medicine, narcotics, use of a cane, has severe unremitting pain and can ambulate only a few blocks before resting. Patient desires elective L total hip arthroplasty to decrease pain and increase function. The risks, benefits, and alternatives were discussed at length including but not limited to the risks of infection, bleeding, nerve injury, stiffness, blood clots, the need for revision surgery, cardiopulmonary complications, among others, and they were willing to proceed. Questions answered     PROCEDURE IN DETAIL: The patient was identified by armband,  [facility] preoperative [facility], taken to the operating room , appropriate anesthetic monitors  were attached and  anesthesia was induced with the patienton the gurney. The HANA boots were applied to the feet and he was then transferred to the HANA table with a peroneal post and support underneath the non-operative le, which was  locked in 5 lb traction. Theoperative lower extremity was then prepped and draped in the usual sterile fashion from just above the iliac crest to the knee. And a timeout procedure was performed. We then made a 15 cm incision along the interval at the leading edge of the tensor fascia lata of starting at 2 cm lateral to and 2 cm distal to the ASIS. Small bleeders in the skin and subcutaneous tissue identified and cauterized we dissected down to the fascia and made an incision in the fascia allowing us to elevate the fascia of the tensor muscle and exploited the interval between the rectus and the tensor fascia lata. A Hohmann retractor was then placed along the superior neck of the femur and a Cobra retractor along the inferior neck of the femur we teed the capsule starting out at the superior anterior aspect of the acetabulum going distally and made the T along the neck both leaflets of the T were tagged with #2 Ethibond suture. Cobra retractors were then placed along the inferior and superior neck allowing us to perform a standard neck cut and removed the femoral  head with a power corkscrew. We then placed a right angle Hohmann retractor along the anterior aspect of the acetabulum a spiked Cobra in the cotyloid notch and posteriorly a Muelller retractor. We then sequentially reamed up to a 47 mm basket reamer obtaining good coverage in all quadrants, verified by C-arm imaging. Under C-arm control with and hammered into place a 48 mm Pinnacle cup in 45 of abduction and 15 of anteversion. The cup seated nicely and required no supplemental screws. We then placed a central hole Eliminator and a 0 polyethylene liner. The foot was then externally rotated to 110, the HANA elevator was placed around the flare of the greater trochanter and the limb was extended and abducted delivering the proximal femur up into the wound. A medium Hohmann retractor was placed over the greater trochanter and a Mueller retractor along the  posterior femoral neck completing the exposure. We then performed releases superiorly and and inferiorly of the capsule going back to the pirformis fossa superiorly and to the lesser trochanter inferiorly. We then entered the proximal femur with the box cutting offset chisel followed by, a canal sounder, the chili pepper and broaching up to a 6 broach. This seated nicely and we reamed the calcar. A trial reduction was performed with a +8 mm 32 mm head.The limb lengths were excellent the hip was stable in 90 of external rotation. At this point the trial components removed and we hammered into place a # 6 Tri-Lock stem with Gryption coating. This was a std offset stem and a + 8 32 mm ceramic ball was then hammered into place the hip was reduced and final C-arm images obtained. The wound was thoroughly irrigated with normal saline solution. We repaired the ant capsule and the tensor fascia lot a with running 0 vicryl suture. the subcutaneous tissue was closed with 2-0 and 3-0 Vicryl suture followed by an Aquacil dressing. At this point the patient was awaken and transferred to hospital gurney without difficulty. The subcutaneous tissue with 0 and 2-0 undyed Vicryl suture and the skin with running  3-0 vicryl subcuticular suture. Aquacil dressing was applied. The patient was then unclamped, rolled supine, awaken extubated and taken to recovery room without difficulty in stable condition.   Paige [name] J 11/30/2015, 12:04 PM

## 2015-11-30 NOTE — Interval H&P Note (Signed)
History and Physical Interval Note:  11/30/2015 9:13 AM  Paige [name]  has presented today for surgery, with the diagnosis of LEFT HIP OSTEOARTHRITIS  The various methods of treatment have been discussed with the patient and family. After consideration of risks, benefits and other options for treatment, the patient has consented to  Procedure(s): TOTAL HIP ARTHROPLASTY ANTERIOR APPROACH (Left) as a surgical intervention .  The patient's history has been reviewed, patient examined, no change in status, stable for surgery.  I have reviewed the patient's chart and labs.  Questions were answered to the patient's satisfaction.     Nestor Lewandowsky

## 2015-11-30 NOTE — Evaluation (Signed)
Physical Therapy Evaluation Patient Details Name: Paige [name] MRN: [id number] DOB: 1946/01/16 Today's Date: 11/30/2015   History of Present Illness  70 y.o. female admitted to [facility] on 11/30/15 for elective L THA.  Pt with significant PMHx of vertigo, HTN, anemia, bil TKA,   Clinical Impression  Pt is POD #0 and is moving well, slowly and has some difficulty still feeling her legs completely, but is able to get up and walk a short distance to the bathroom with RW and min assist.  Pt is planning on going to [facility] for rehab at discharge as she lives alone and her husband has advanced MS and is a SNF resident.   PT to follow acutely for deficits listed below.       Follow Up Recommendations SNF (Plans to go [facility])    Equipment Recommendations  None recommended by PT    Recommendations for Other Services    NA    Precautions / Restrictions Precautions Precautions: None (none ordered) Restrictions Weight Bearing Restrictions: Yes LLE Weight Bearing: Weight bearing as tolerated      Mobility  Bed Mobility Overal bed mobility: Needs Assistance Bed Mobility: Supine to Sit     Supine to sit: Min guard     General bed mobility comments: Min guard   Transfers Overall transfer level: Needs assistance Equipment used: Rolling walker (2 wheeled) Transfers: Sit to/from Stand Sit to Stand: Min assist         General transfer comment: Min assist to support trunk when transitioning to stand.  Pt feels "funny" up on her legs as she does not quite have full sensation back yet.   Ambulation/Gait Ambulation/Gait assistance: Min assist Ambulation Distance (Feet): 15 Feet Assistive device: Rolling walker (2 wheeled) Gait Pattern/deviations: Step-to pattern;Antalgic;Leaning posteriorly Gait velocity: decreased   General Gait Details: Pt states she feels as though she is going to fall forward, however, she is actually leaning posteriorly.  Pt is slow to move, but able to  make it into the bathroom with RW.          Balance Overall balance assessment: Needs assistance Sitting-balance support: Feet supported;Bilateral upper extremity supported Sitting balance-Leahy Scale: Good   Postural control: Posterior lean Standing balance support: Bilateral upper extremity supported Standing balance-Leahy Scale: Poor                               Pertinent Vitals/Pain Pain Assessment: 0-10 Pain Score: 3  Pain Location: left hip/thigh Pain Descriptors / Indicators: Aching;Burning Pain Intervention(s): Monitored during session;Limited activity within patient's tolerance;Repositioned    Home Living Family/patient expects to be discharged to:: Skilled nursing facility [facility])                      Prior Function Level of Independence: Independent with assistive device(s)         Comments: used a RW around the house PTA, used cane when going out because she could not lift the RW into the car     Hand Dominance   Dominant Hand: Right    Extremity/Trunk Assessment   Upper Extremity Assessment: Defer to OT evaluation           Lower Extremity Assessment: LLE deficits/detail   LLE Deficits / Details: left leg with normal post op pain and weakness.  Ankle at least 3/5, knee 3/5, hip 2/5.  Cervical / Trunk Assessment: Normal  Communication  Communication: No difficulties  Cognition Arousal/Alertness: Awake/alert Behavior During Therapy: WFL for tasks assessed/performed Overall Cognitive Status: Within Functional Limits for tasks assessed                         Exercises Total Joint Exercises Ankle Circles/Pumps: AROM;Both;20 reps Quad Sets: AROM;Both;10 reps Gluteal Sets: AAROM;Left;10 reps   Assessment/Plan    PT Assessment Patient needs continued PT services  PT Problem List Decreased strength;Decreased range of motion;Decreased activity tolerance;Decreased balance;Decreased mobility;Decreased  knowledge of use of DME;Pain          PT Treatment Interventions DME instruction;Gait training;Stair training;Therapeutic activities;Functional mobility training;Therapeutic exercise;Balance training;Patient/family education;Manual techniques;Modalities    PT Goals (Current goals can be found in the Care Plan section)  Acute Rehab PT Goals Patient Stated Goal: to go to SNF for rehab before returning home unassisted PT Goal Formulation: With patient Time For Goal Achievement: 12/07/15 Potential to Achieve Goals: Good    Frequency 7X/week   Barriers to discharge Decreased caregiver support Pt's husband is a SNF resident and has advanced MS.       End of Session Equipment Utilized During Treatment: Gait belt Activity Tolerance: Patient limited by pain Patient left: in chair;with call bell/phone within reach           Time: 1711-1735 PT Time Calculation (min) (ACUTE ONLY): 24 min   Charges:   PT Evaluation $PT Eval Moderate Complexity: 1 Procedure PT Treatments $Therapeutic Activity: 8-22 mins        Liliah Dorian B. Montserrat Shek, PT, DPT [phone]   11/30/2015, 5:41 PM

## 2015-11-30 NOTE — Transfer of Care (Signed)
Immediate Anesthesia Transfer of Care Note  Patient: Paige [name]  Procedure(s) Performed: Procedure(s): TOTAL HIP ARTHROPLASTY ANTERIOR APPROACH (Left)  Patient Location: PACU  Anesthesia Type:MAC and Spinal  Level of Consciousness: awake, alert , oriented and patient cooperative  Airway & Oxygen Therapy: Patient Spontanous Breathing and Patient connected to nasal cannula oxygen  Post-op Assessment: Report given to RN, Post -op Vital signs reviewed and stable and Patient moving all extremities X 4  Post vital signs: Reviewed and stable  Last Vitals:  Vitals:   11/30/15 0859  BP: (!) 152/61  Pulse: 85  Resp: 20  Temp: 36.8 C    Last Pain:  Vitals:   11/30/15 0938  TempSrc:   PainSc: 5       Patients Stated Pain Goal: 3 (11/30/15 7616)  Complications: No apparent anesthesia complications

## 2015-11-30 NOTE — Anesthesia Postprocedure Evaluation (Signed)
Anesthesia Post Note  Patient: Paige [name]  Procedure(s) Performed: Procedure(s) (LRB): TOTAL HIP ARTHROPLASTY ANTERIOR APPROACH (Left)  Patient location during evaluation: PACU Anesthesia Type: Spinal Level of consciousness: awake Pain management: satisfactory to patient Vital Signs Assessment: post-procedure vital signs reviewed and stable Respiratory status: spontaneous breathing Cardiovascular status: blood pressure returned to baseline Postop Assessment: no headache and spinal receding Anesthetic complications: no    Last Vitals:  Vitals:   11/30/15 1410 11/30/15 1412  BP:  119/64  Pulse:  72  Resp:  12  Temp: 36.6 C     Last Pain:  Vitals:   11/30/15 1410  TempSrc:   PainSc: 4                  Melvinia Ashby EDWARD

## 2015-11-30 NOTE — Anesthesia Procedure Notes (Signed)
Spinal  Patient location during procedure: OR Preanesthetic Checklist Completed: patient identified, site marked, surgical consent, pre-op evaluation, timeout performed, IV checked, risks and benefits discussed and monitors and equipment checked Spinal Block Patient position: sitting Prep: DuraPrep Patient monitoring: heart rate, cardiac monitor, continuous pulse ox and blood pressure Approach: midline Location: L3-4 Injection technique: single-shot Needle Needle type: Sprotte and Tuohy  Needle gauge: 24 G Needle length: 12.7 cm Assessment Sensory level: T4 Additional Notes 6 cm LOR with tuohy after unable to fell well with 24 GA sprotte x 2 (+) CSF asp;  Then dosed  LLD x 3 min

## 2015-12-01 ENCOUNTER — Encounter (HOSPITAL_COMMUNITY): Payer: Self-pay | Admitting: Orthopedic Surgery

## 2015-12-01 LAB — CBC
HCT: 42.6 % (ref 36.0–46.0)
Hemoglobin: 14.3 g/dL (ref 12.0–15.0)
MCH: 32 pg (ref 26.0–34.0)
MCHC: 33.6 g/dL (ref 30.0–36.0)
MCV: 95.3 fL (ref 78.0–100.0)
PLATELETS: 244 10*3/uL (ref 150–400)
RBC: 4.47 MIL/uL (ref 3.87–5.11)
RDW: 12.9 % (ref 11.5–15.5)
WBC: 16.4 10*3/uL — AB (ref 4.0–10.5)

## 2015-12-01 LAB — BASIC METABOLIC PANEL
Anion gap: 12 (ref 5–15)
BUN: 14 mg/dL (ref 6–20)
CHLORIDE: 102 mmol/L (ref 101–111)
CO2: 23 mmol/L (ref 22–32)
CREATININE: 0.77 mg/dL (ref 0.44–1.00)
Calcium: 9 mg/dL (ref 8.9–10.3)
Glucose, Bld: 104 mg/dL — ABNORMAL HIGH (ref 65–99)
POTASSIUM: 5.2 mmol/L — AB (ref 3.5–5.1)
SODIUM: 137 mmol/L (ref 135–145)

## 2015-12-01 NOTE — Clinical Social Work Note (Signed)
Clinical Social Work Assessment  Patient Details  Name: Paige [name] MRN: [id number] Date of Birth: 1945/02/09  Date of referral:  12/01/15               Reason for consult:  Facility Placement (Patient states that she has been pre-accepted to [facility])                Permission sought to share information with:   (Facilities ) Permission granted to share information::   ([facility])  Name::        Agency::     Relationship::     Contact Information:     Housing/Transportation Living arrangements for the past 2 months:  Single Family Home Source of Information:  Patient Patient Interpreter Needed:  None Criminal Activity/Legal Involvement Pertinent to Current Situation/Hospitalization:  No - Comment as needed Significant Relationships:   [employer] Family) Lives with:  Self Do you feel safe going back to the place where you live?  Yes (Patienat states she will go to SNF) Need for family participation in patient care:  Yes (Comment)  Care giving concerns:  Patient states that prior to L Hip replacement she was completing ADLs independently. However, now she states she needs assistance with ADLs and is interested in SNF. SW will refer pt.    Social Worker assessment / plan:  SW met with patient at bedside. Patient is alert and oriented. There was no family present. Patient states that she has a good support which consist of her church family. Patient states that she has spoken with [facility] and has been accepted. Patient states that she has been to the facility as a pt x2 in the past due to her knees.   Employment status:  Retired Forensic scientist:   Therapist, occupational) PT Recommendations:  Branch / Referral to community resources:   (SNF)  Patient/Family's Response to care:  Appropriate.   Patient/Family's Understanding of and Emotional Response to Diagnosis, Current Treatment, and Prognosis:  No questions.   Emotional Assessment Appearance:   Appears stated age Attitude/Demeanor/Rapport:   (appropriate) Affect (typically observed):  Accepting Orientation:  Oriented to Self, Oriented to Place, Oriented to  Time, Oriented to Situation Alcohol / Substance use:  Not Applicable Psych involvement (Current and /or in the community):  No (Comment)  Discharge Needs  Concerns to be addressed:  No discharge needs identified Readmission within the last 30 days:  No Current discharge risk:  None Barriers to Discharge:  No Barriers Identified   Bernita Buffy 12/01/2015, 1:37 PM

## 2015-12-01 NOTE — Progress Notes (Signed)
PATIENT ID: Paige [name]  MRN: [id number]  DOB/AGE:  01-29-1945 / 70 y.o.  1 Day Post-Op Procedure(s) (LRB): TOTAL HIP ARTHROPLASTY ANTERIOR APPROACH (Left)    PROGRESS NOTE Subjective: Patient is alert, oriented, x1 Nausea, no Vomiting, yes passing gas, . Taking PO well. Denies SOB, Chest or Calf Pain. Using Incentive Spirometer, PAS in place. Ambulate 31ft Patient reports pain as  1/10  .    Objective: Vital signs in last 24 hours: Vitals:   11/30/15 1435 11/30/15 2000 12/01/15 0030 12/01/15 0410  BP: (!) 117/48 (!) 102/59 (!) 111/54 (!) 115/41  Pulse: 74 93 80 75  Resp: 18 18 17 17   Temp: 98.2 F (36.8 C) 98.8 F (37.1 C) 98.1 F (36.7 C) 97.9 F (36.6 C)  TempSrc: Oral Oral Oral Oral  SpO2: 96% 96% 95% 99%  Weight:      Height:          Intake/Output from previous day: I/O last 3 completed shifts: In: 3595.4 [I.V.:3435.4; IV Piggyback:160] Out: 550 [Urine:200; Blood:350]   Intake/Output this shift: No intake/output data recorded.   LABORATORY DATA: No results for input(s): WBC, HGB, HCT, PLT, NA, K, CL, CO2, BUN, CREATININE, GLUCOSE, GLUCAP, INR, CALCIUM in the last 72 hours.  Invalid input(s): PT, 2  Examination: Neurologically intact ABD soft Neurovascular intact Sensation intact distally Intact pulses distally Dorsiflexion/Plantar flexion intact Incision: no drainage No cellulitis present Compartment soft} XR AP&Lat of hip shows well placed\fixed THA  Assessment:   1 Day Post-Op Procedure(s) (LRB): TOTAL HIP ARTHROPLASTY ANTERIOR APPROACH (Left) ADDITIONAL DIAGNOSIS:  Expected Acute Blood Loss Anemia,   Plan: PT/OT WBAT, THA  DVT Prophylaxis: SCDx72 hrs, ASA 325 mg BID x 2 weeks  DISCHARGE PLAN: Skilled Nursing Facility/Rehab, [facility]  DISCHARGE NEEDS: HHPT, Walker and 3-in-1 comode seatPatient ID: Paige [name], female   DOB: 11/13/1945, 70 y.o.   MRN: [id number]

## 2015-12-01 NOTE — Progress Notes (Signed)
Physical Therapy Treatment Patient Details Name: Paige [name] MRN: [id number] DOB: October 25, 1945 Today's Date: 12/01/2015    History of Present Illness 70 y.o. female admitted to [facility] on 11/30/15 for elective L THA.  Pt with significant PMHx of vertigo, HTN, anemia, bil TKA,     PT Comments    Patient is making progress toward mobility goals. Tolerated increased gait distance and therex this session. Continue to progress as tolerated with anticipated d/c to SNF for further skilled PT services.    Follow Up Recommendations  SNF (Plans to go to [facility])     Equipment Recommendations  None recommended by PT    Recommendations for Other Services       Precautions / Restrictions Precautions Precautions: None (none ordered) Restrictions Weight Bearing Restrictions: Yes LLE Weight Bearing: Weight bearing as tolerated    Mobility  Bed Mobility Overal bed mobility: Needs Assistance Bed Mobility: Supine to Sit     Supine to sit: Supervision     General bed mobility comments: supervision for safety; increased time and effort required; min use of rails and HOB elevated  Transfers Overall transfer level: Needs assistance Equipment used: Rolling walker (2 wheeled) Transfers: Sit to/from [employer] Sit to Stand: Min guard Stand pivot transfers: Min guard       General transfer comment: min guard for safety; cues for hand placement  Ambulation/Gait Ambulation/Gait assistance: Min guard Ambulation Distance (Feet): 100 Feet Assistive device: Rolling walker (2 wheeled) Gait Pattern/deviations: Step-through pattern;Decreased stance time - left;Decreased step length - right;Decreased weight shift to left;Antalgic Gait velocity: decreased   General Gait Details: cues for sequencing, posture, and encouraged to increase WB on L LE; improved step through pattern with increased distance   Stairs            Wheelchair Mobility    Modified Rankin (Stroke  Patients Only)       Balance     Sitting balance-Leahy Scale: Good       Standing balance-Leahy Scale: Poor                      Cognition Arousal/Alertness: Awake/alert Behavior During Therapy: WFL for tasks assessed/performed Overall Cognitive Status: Within Functional Limits for tasks assessed                      Exercises Total Joint Exercises Quad Sets: AROM;Both;10 reps Heel Slides: AROM;Left;10 reps;Seated Long Arc Quad: AROM;Left;10 reps;Seated    General Comments        Pertinent Vitals/Pain Pain Assessment: Faces Faces Pain Scale: Hurts little more Pain Location: left anterior thigh/groin Pain Descriptors / Indicators: Aching;Sore Pain Intervention(s): Limited activity within patient's tolerance;Monitored during session;Premedicated before session;Repositioned    Home Living                      Prior Function            PT Goals (current goals can now be found in the care plan section) Acute Rehab PT Goals Patient Stated Goal: to go to SNF for rehab before returning home unassisted PT Goal Formulation: With patient Time For Goal Achievement: 12/07/15 Potential to Achieve Goals: Good Progress towards PT goals: Progressing toward goals    Frequency    7X/week      PT Plan Current plan remains appropriate    Co-evaluation             End of Session Equipment Utilized During  Treatment: Gait belt Activity Tolerance: Patient tolerated treatment well Patient left: in chair;with call bell/phone within reach     Time: 0902-0941 PT Time Calculation (min) (ACUTE ONLY): 39 min  Charges:  $Gait Training: 8-22 mins $Therapeutic Exercise: 8-22 mins $Therapeutic Activity: 8-22 mins                    G Codes:      Paige [name], PTA Pager: [phone]   12/01/2015, 9:48 AM

## 2015-12-01 NOTE — Evaluation (Signed)
Occupational Therapy Evaluation Patient Details Name: Paige [name] MRN: [id number] DOB: November 21, 1945 Today's Date: 12/01/2015    History of Present Illness 70 y.o. female admitted to [facility] on 11/30/15 for elective L THA.  Pt with significant PMHx of vertigo, HTN, anemia, bil TKA,    Clinical Impression  PTA, pt was independent with basic ADL and IADL. Pt currently requires moderate assist with LB ADL and min guard assist for functional mobility. Pt lives alone and does not have family/friends available 24 hours/day for assistance. Pt plans to D/C to SNF post-acute D/C in order to maximize independence and return to PLOF. Recommend short-term SNF placement for continued rehabilitation services to improve independence with ADL and functional mobility to ensure safety once pt returns home. Pt would benefit from continued OT services while admitted to improve independence with ADL. OT will continue to follow acutely.    Follow Up Recommendations  SNF (Pt plans to D/C to [facility] )    Equipment Recommendations  Other (comment) (TBD at next venue of care)       Precautions / Restrictions Precautions Precautions: None Restrictions Weight Bearing Restrictions: Yes LLE Weight Bearing: Weight bearing as tolerated      Mobility Bed Mobility Overal bed mobility: Needs Assistance Bed Mobility: Supine to Sit     Supine to sit: Supervision     General bed mobility comments: Out of bed in chair on OT arrival.  Transfers Overall transfer level: Needs assistance Equipment used: Rolling walker (2 wheeled) Transfers: Sit to/from Stand Sit to Stand: Min guard Stand pivot transfers: Min guard       General transfer comment: min guard for safety; cues for hand placement    Balance Overall balance assessment: Needs assistance Sitting-balance support: Feet supported;No upper extremity supported Sitting balance-Leahy Scale: Good     Standing balance support: Single extremity  supported;During functional activity Standing balance-Leahy Scale: Poor Standing balance comment: Able to remove single UE from RW for oral care at sink with min guard assist for safety.                            ADL Overall ADL's : Needs assistance/impaired     Grooming: Oral care;Min guard;Standing   Upper Body Bathing: Set up;Sitting   Lower Body Bathing: Moderate assistance;Sit to/from stand   Upper Body Dressing : Set up;Sitting   Lower Body Dressing: Moderate assistance;Sit to/from stand   Toilet Transfer: Min guard;Ambulation;Comfort height toilet;RW;Grab bars   Toileting- Clothing Manipulation and Hygiene: Min guard;Sit to/from stand       Functional mobility during ADLs: Min guard;Rolling walker General ADL Comments: Pt educated on ice for pain management, fall prevention, and safe use of DME during ADL.     Vision Vision Assessment?: No apparent visual deficits          Pertinent Vitals/Pain Pain Assessment: 0-10 Pain Score: 3  Faces Pain Scale: Hurts little more Pain Location: L anterior thigh/groin Pain Descriptors / Indicators: Aching;Sore Pain Intervention(s): Limited activity within patient's tolerance;Monitored during session;Repositioned;Ice applied     Hand Dominance Right   Extremity/Trunk Assessment Upper Extremity Assessment Upper Extremity Assessment: Overall WFL for tasks assessed   Lower Extremity Assessment Lower Extremity Assessment: LLE deficits/detail LLE Deficits / Details: Decreased strength and ROM as expected post-operatively.       Communication Communication Communication: No difficulties   Cognition Arousal/Alertness: Awake/alert Behavior During Therapy: WFL for tasks assessed/performed Overall Cognitive Status: Within Functional Limits for  tasks assessed                        Exercises Exercises: Total Joint     Shoulder Instructions      Home Living Family/patient expects to be discharged  to:: Skilled nursing facility                                 Additional Comments: Pt lives alone       Prior Functioning/Environment Level of Independence: Independent with assistive device(s)        Comments: used a RW around the house PTA, used cane when going out because she could not lift the RW into the car        OT Problem List: Decreased strength;Decreased range of motion;Decreased activity tolerance;Impaired balance (sitting and/or standing);Decreased safety awareness;Decreased knowledge of use of DME or AE;Decreased knowledge of precautions;Pain   OT Treatment/Interventions: Self-care/ADL training;Therapeutic exercise;Therapeutic activities;Patient/family education;Balance training;DME and/or AE instruction    OT Goals(Current goals can be found in the care plan section) Acute Rehab OT Goals Patient Stated Goal: to go to SNF for rehab before returning home unassisted OT Goal Formulation: With patient Time For Goal Achievement: 12/15/15 Potential to Achieve Goals: Good ADL Goals Pt Will Perform Lower Body Bathing: with modified independence;sit to/from stand Pt Will Perform Lower Body Dressing: with modified independence;sit to/from stand Pt Will Transfer to Toilet: with modified independence;ambulating;bedside commode Pt Will Perform Toileting - Clothing Manipulation and hygiene: with modified independence;sit to/from stand  OT Frequency: Min 1X/week    End of Session Equipment Utilized During Treatment: Gait belt;Rolling walker  Activity Tolerance: Patient tolerated treatment well Patient left: in chair;with call bell/phone within reach   Time: [id number] OT Time Calculation (min): 20 min Charges:  OT General Charges $OT Visit: 1 Procedure OT Evaluation $OT Eval Moderate Complexity: 1 Procedure  Doristine SectionCharity A Zachari Alberta, OTR/L [phone] 12/01/2015, 12:54 PM

## 2015-12-01 NOTE — [place] FL2 (Signed)
[place] MEDICAID FL2 LEVEL OF CARE SCREENING TOOL     IDENTIFICATION  Patient Name: Paige [name] Birthdate: September 28, 1945 Sex: female Admission Date (Current Location): 11/30/2015  [facility] and [place] Number:  Producer, television/film/video and Address:  [facility] [place], [address] [place], [place] [address]      Provider Number: [id number]  Attending Physician Name and Address:  Gean Birchwood, MD  Relative Name and Phone Number:       Current Level of Care: Hospital Recommended Level of Care: Skilled Nursing Facility Prior Approval Number:    Date Approved/Denied:   PASRR Number:  [id number])  Discharge Plan: SNF    Current Diagnoses: Patient Active Problem List   Diagnosis Date Noted  . Primary osteoarthritis of left hip 11/26/2015  . Arthritis of right knee 02/10/2014  . Primary osteoarthritis of right knee 02/09/2014  . Arthritis of knee, left 09/25/2013  . Arthritis of knee 09/25/2013    Orientation RESPIRATION BLADDER Height & Weight     Self, Time, Situation, Place  Normal Incontinent Weight: 275 lb 12.8 oz (125.1 kg) Height:  5' 8.25" (173.4 cm)  BEHAVIORAL SYMPTOMS/MOOD NEUROLOGICAL BOWEL NUTRITION STATUS      Continent    AMBULATORY STATUS COMMUNICATION OF NEEDS Skin   Limited Assist   Normal                       Personal Care Assistance Level of Assistance  Bathing, Dressing Bathing Assistance: Limited assistance   Dressing Assistance: Limited assistance     Functional Limitations Info             SPECIAL CARE FACTORS FREQUENCY                       Contractures      Additional Factors Info   (Full)               Current Medications (12/01/2015):  This is the current hospital active medication list Current Facility-Administered Medications  Medication Dose Route Frequency Provider Last Rate Last Dose  . acetaminophen (TYLENOL) tablet 650 mg  650 mg Oral Q6H PRN Gean Birchwood, MD       Or  .  acetaminophen (TYLENOL) suppository 650 mg  650 mg Rectal Q6H PRN Gean Birchwood, MD      . aluminum hydroxide (AMPHOJEL/ALTERNAGEL) suspension 15-30 mL  15-30 mL Oral Q4H PRN Gean Birchwood, MD      . aspirin EC tablet 325 mg  325 mg Oral Q breakfast Gean Birchwood, MD   325 mg at 12/01/15 0817  . bisacodyl (DULCOLAX) EC tablet 5 mg  5 mg Oral Daily PRN Gean Birchwood, MD      . celecoxib (CELEBREX) capsule 200 mg  200 mg Oral Q12H Gean Birchwood, MD   200 mg at 12/01/15 0817  . dextrose 5 % and 0.45 % NaCl with KCl 20 mEq/L infusion   Intravenous Continuous Gean Birchwood, MD 125 mL/hr at 11/30/15 1505    . diphenhydrAMINE (BENADRYL) 12.5 MG/5ML elixir 12.5-25 mg  12.5-25 mg Oral Q4H PRN Gean Birchwood, MD      . docusate sodium (COLACE) capsule 100 mg  100 mg Oral BID Gean Birchwood, MD   100 mg at 12/01/15 0817  . HYDROmorphone (DILAUDID) injection 0.5-1 mg  0.5-1 mg Intravenous Q2H PRN Gean Birchwood, MD   1 mg at 12/01/15 0043  . iron polysaccharides (NIFEREX) capsule 150 mg  150 mg Oral Daily Gean BirchwoodFrank Rowan, MD   150 mg at 12/01/15 0817  . magnesium oxide (MAG-OX) tablet 800 mg  800 mg Oral QHS Gean BirchwoodFrank Rowan, MD   800 mg at 11/30/15 2143  . menthol-cetylpyridinium (CEPACOL) lozenge 3 mg  1 lozenge Oral PRN Gean BirchwoodFrank Rowan, MD       Or  . phenol (CHLORASEPTIC) mouth spray 1 spray  1 spray Mouth/Throat PRN Gean BirchwoodFrank Rowan, MD      . methocarbamol (ROBAXIN) tablet 500 mg  500 mg Oral Q6H PRN Gean BirchwoodFrank Rowan, MD   500 mg at 12/01/15 1256   Or  . methocarbamol (ROBAXIN) 500 mg in dextrose 5 % 50 mL IVPB  500 mg Intravenous Q6H PRN Gean BirchwoodFrank Rowan, MD      . metoCLOPramide (REGLAN) tablet 5-10 mg  5-10 mg Oral Q8H PRN Gean BirchwoodFrank Rowan, MD       Or  . metoCLOPramide (REGLAN) injection 5-10 mg  5-10 mg Intravenous Q8H PRN Gean BirchwoodFrank Rowan, MD      . ondansetron [facility](ZOFRAN) tablet 4 mg  4 mg Oral Q6H PRN Gean BirchwoodFrank Rowan, MD       Or  . ondansetron [facility](ZOFRAN) injection 4 mg  4 mg Intravenous Q6H PRN Gean BirchwoodFrank Rowan, MD      . oxyCODONE (Oxy IR/ROXICODONE) immediate  release tablet 5-10 mg  5-10 mg Oral Q3H PRN Gean BirchwoodFrank Rowan, MD   10 mg at 12/01/15 1251  . PARoxetine (PAXIL) tablet 40 mg  40 mg Oral Daily Gean BirchwoodFrank Rowan, MD   40 mg at 12/01/15 0818  . polyethylene glycol (MIRALAX / GLYCOLAX) packet 17 g  17 g Oral Daily PRN Gean BirchwoodFrank Rowan, MD      . pramipexole (MIRAPEX) tablet 0.25 mg  0.25 mg Oral BID Gean BirchwoodFrank Rowan, MD   0.25 mg at 12/01/15 0817  . sodium phosphate (FLEET) 7-19 GM/118ML enema 1 enema  1 enema Rectal Once PRN Gean BirchwoodFrank Rowan, MD      . traZODone (DESYREL) tablet 150 mg  150 mg Oral QHS Gean BirchwoodFrank Rowan, MD   150 mg at 11/30/15 2137     Discharge Medications: Please see discharge summary for a list of discharge medications.  Relevant Imaging Results:  Relevant Lab Results:   Additional Information  (SS: [id number])  Paige [name], Paige [name]

## 2015-12-02 DIAGNOSIS — I999 Unspecified disorder of circulatory system: Secondary | ICD-10-CM | POA: Diagnosis not present

## 2015-12-02 DIAGNOSIS — Z96642 Presence of left artificial hip joint: Secondary | ICD-10-CM | POA: Diagnosis not present

## 2015-12-02 DIAGNOSIS — I10 Essential (primary) hypertension: Secondary | ICD-10-CM | POA: Diagnosis not present

## 2015-12-02 DIAGNOSIS — Z9089 Acquired absence of other organs: Secondary | ICD-10-CM | POA: Diagnosis not present

## 2015-12-02 DIAGNOSIS — M1991 Primary osteoarthritis, unspecified site: Secondary | ICD-10-CM | POA: Diagnosis not present

## 2015-12-02 DIAGNOSIS — D649 Anemia, unspecified: Secondary | ICD-10-CM | POA: Diagnosis not present

## 2015-12-02 DIAGNOSIS — G4700 Insomnia, unspecified: Secondary | ICD-10-CM | POA: Diagnosis not present

## 2015-12-02 DIAGNOSIS — M129 Arthropathy, unspecified: Secondary | ICD-10-CM | POA: Diagnosis not present

## 2015-12-02 DIAGNOSIS — M25569 Pain in unspecified knee: Secondary | ICD-10-CM | POA: Diagnosis not present

## 2015-12-02 DIAGNOSIS — S32302D Unspecified fracture of left ilium, subsequent encounter for fracture with routine healing: Secondary | ICD-10-CM | POA: Diagnosis not present

## 2015-12-02 DIAGNOSIS — K219 Gastro-esophageal reflux disease without esophagitis: Secondary | ICD-10-CM | POA: Diagnosis not present

## 2015-12-02 DIAGNOSIS — D509 Iron deficiency anemia, unspecified: Secondary | ICD-10-CM | POA: Diagnosis not present

## 2015-12-02 DIAGNOSIS — M1711 Unilateral primary osteoarthritis, right knee: Secondary | ICD-10-CM | POA: Diagnosis not present

## 2015-12-02 DIAGNOSIS — G2581 Restless legs syndrome: Secondary | ICD-10-CM | POA: Diagnosis not present

## 2015-12-02 DIAGNOSIS — Z471 Aftercare following joint replacement surgery: Secondary | ICD-10-CM | POA: Diagnosis not present

## 2015-12-02 DIAGNOSIS — Z9849 Cataract extraction status, unspecified eye: Secondary | ICD-10-CM | POA: Diagnosis not present

## 2015-12-02 DIAGNOSIS — M1990 Unspecified osteoarthritis, unspecified site: Secondary | ICD-10-CM | POA: Diagnosis not present

## 2015-12-02 LAB — CBC
HEMATOCRIT: 35 % — AB (ref 36.0–46.0)
HEMOGLOBIN: 11.7 g/dL — AB (ref 12.0–15.0)
MCH: 32.1 pg (ref 26.0–34.0)
MCHC: 33.4 g/dL (ref 30.0–36.0)
MCV: 96.2 fL (ref 78.0–100.0)
Platelets: 188 10*3/uL (ref 150–400)
RBC: 3.64 MIL/uL — ABNORMAL LOW (ref 3.87–5.11)
RDW: 13.4 % (ref 11.5–15.5)
WBC: 13.6 10*3/uL — ABNORMAL HIGH (ref 4.0–10.5)

## 2015-12-02 MED ORDER — ASPIRIN EC 325 MG PO TBEC: 325.0000 mg | DELAYED_RELEASE_TABLET | Freq: Two times a day (BID) | ORAL | 0 refills | Status: DC

## 2015-12-02 MED ORDER — METHOCARBAMOL 500 MG PO TABS: 500.0000 mg | ORAL_TABLET | Freq: Two times a day (BID) | ORAL | 0 refills | Status: DC

## 2015-12-02 MED ORDER — OXYCODONE-ACETAMINOPHEN 5-325 MG PO TABS: 1.0000 | ORAL_TABLET | ORAL | 0 refills | Status: DC | PRN

## 2015-12-02 NOTE — Progress Notes (Signed)
PATIENT ID: Paige [name]  MRN: [id number]  DOB/AGE:  [birth year] / 70 y.o.  2 Days Post-Op Procedure(s) (LRB): TOTAL HIP ARTHROPLASTY ANTERIOR APPROACH (Left)    PROGRESS NOTE Subjective: Patient is alert, oriented, no Nausea, no Vomiting, yes passing gas, . Taking PO well. Denies SOB, Chest or Calf Pain. Using Incentive Spirometer, PAS in place. Ambulate WBAT with pt walling 100 ft Patient reports pain as mild .    Objective: Vital signs in last 24 hours: Vitals:   12/01/15 0410 12/01/15 1300 12/01/15 2015 12/02/15 0539  BP: (!) 115/41 (!) 127/57 129/65 (!) 97/44  Pulse: 75 86 79 76  Resp: 17 18 18 17   Temp: 97.9 F (36.6 C) 98.1 F (36.7 C) 97.9 F (36.6 C) 98.2 F (36.8 C)  TempSrc: Oral Oral Oral Oral  SpO2: 99% 96% 96% 99%  Weight:      Height:          Intake/Output from previous day: I/O last 3 completed shifts: In: 2255.4 [P.O.:720; I.V.:1535.4] Out: -    Intake/Output this shift: No intake/output data recorded.   LABORATORY DATA:  Recent Labs  12/01/15 0702 12/01/15 0923 12/02/15 0659  WBC  --  16.4* PENDING  HGB  --  14.3 11.7*  HCT  --  42.6 35.0*  PLT  --  244 188  NA 137  --   --   K 5.2*  --   --   CL 102  --   --   CO2 23  --   --   BUN 14  --   --   CREATININE 0.77  --   --   GLUCOSE 104*  --   --   CALCIUM 9.0  --   --     Examination: Neurologically intact Neurovascular intact Sensation intact distally Intact pulses distally Dorsiflexion/Plantar flexion intact Incision: dressing C/D/I No cellulitis present Compartment soft} XR AP&Lat of hip shows well placed\fixed THA  Assessment:   2 Days Post-Op Procedure(s) (LRB): TOTAL HIP ARTHROPLASTY ANTERIOR APPROACH (Left) ADDITIONAL DIAGNOSIS:  Expected Acute Blood Loss Anemia, obesity  Plan: PT/OT WBAT, THA  DVT Prophylaxis: SCDx72 hrs, ASA 325 mg BID x 2 weeks  DISCHARGE PLAN: Skilled Nursing Facility/Rehab  DISCHARGE NEEDS: HHPT, Walker and 3-in-1 comode seat

## 2015-12-02 NOTE — Progress Notes (Signed)
Pt stable for d/c to SNF today per MD. Report called to Misha at [facility], all questions answered. Belongings packed and sent with pt. She was transported to facility via [facility].   D[place]ham, Latricia Heft

## 2015-12-02 NOTE — Discharge Summary (Signed)
Patient ID: Paige [name] MRN: [id number] DOB/AGE: 70-Nov-1947 70 y.o.  Admit date: 11/30/2015 Discharge date: 12/02/2015  Admission Diagnoses:  Principal Problem:   Primary osteoarthritis of left hip   Discharge Diagnoses:  Same  Past Medical History:  Diagnosis Date  . Anemia   . Arthritis   . Depression   . GERD (gastroesophageal reflux disease)   . H/O hiatal hernia   . Hypertension    not being treated  . Lymphedema of both lower extremities   . PONV (postoperative nausea and vomiting)    after Cholecysectomy  . Restless legs syndrome   . Vertigo     Surgeries: Procedure(s): TOTAL HIP ARTHROPLASTY ANTERIOR APPROACH on 11/30/2015   Consultants:   Discharged Condition: Improved  Hospital Course: Paige [name] is an 70 y.o. female who was admitted 11/30/2015 for operative treatment ofPrimary osteoarthritis of left hip. Patient has severe unremitting pain that affects sleep, daily activities, and work/hobbies. After pre-op clearance the patient was taken to the operating room on 11/30/2015 and underwent  Procedure(s): TOTAL HIP ARTHROPLASTY ANTERIOR APPROACH.    Patient was given perioperative antibiotics: Anti-infectives    Start     Dose/Rate Route Frequency Ordered Stop   11/30/15 0930  ceFAZolin (ANCEF) 3 g in dextrose 5 % 50 mL IVPB     3 g 130 mL/hr over 30 Minutes Intravenous To [facility]Stay [facility] 11/27/15 1024 11/30/15 1010       Patient was given sequential compression devices, early ambulation, and chemoprophylaxis to prevent DVT.  Patient benefited maximally from hospital stay and there were no complications.    Recent vital signs: Patient Vitals for the past 24 hrs:  BP Temp Temp src Pulse Resp SpO2  12/02/15 0539 (!) 97/44 98.2 F (36.8 C) Oral 76 17 99 %  12/01/15 2015 129/65 97.9 F (36.6 C) Oral 79 18 96 %  12/01/15 1300 (!) 127/57 98.1 F (36.7 C) Oral 86 18 96 %     Recent laboratory studies:  Recent Labs  12/01/15 0702  12/01/15 0923 12/02/15 0659  WBC  --  16.4* PENDING  HGB  --  14.3 11.7*  HCT  --  42.6 35.0*  PLT  --  244 188  NA 137  --   --   K 5.2*  --   --   CL 102  --   --   CO2 23  --   --   BUN 14  --   --   CREATININE 0.77  --   --   GLUCOSE 104*  --   --   CALCIUM 9.0  --   --      Discharge Medications:     Medication List    STOP taking these medications   naproxen sodium 220 MG tablet Commonly known as:  ANAPROX     TAKE these medications   aspirin EC 325 MG tablet Take 1 tablet (325 mg total) by mouth 2 (two) times daily.   buPROPion 150 MG 24 hr tablet Commonly known as:  WELLBUTRIN XL Take 150 mg by mouth daily.   calcium carbonate 500 MG chewable tablet Commonly known as:  TUMS - dosed in mg elemental calcium Chew 1-2 tablets by mouth daily as needed for indigestion or heartburn.   IRON COMPLEX PO Take 1 tablet by mouth daily.   MAGNESIUM-OXIDE 400 (241.3 Mg) MG tablet Generic drug:  magnesium oxide Take 2 tablets by mouth at bedtime.   meclizine 25 MG tablet Commonly known  as:  ANTIVERT Take 1 tablet (25 mg total) by mouth 3 (three) times daily as needed.   methocarbamol 500 MG tablet Commonly known as:  ROBAXIN Take 1 tablet (500 mg total) by mouth 2 (two) times daily with a meal.   multivitamin with minerals tablet Take 1 tablet by mouth daily.   oxyCODONE-acetaminophen 5-325 MG tablet Commonly known as:  ROXICET Take 1 tablet by mouth every 4 (four) hours as needed.   PARoxetine 40 MG tablet Commonly known as:  PAXIL Take 40 mg by mouth every morning.   pramipexole 0.25 MG tablet Commonly known as:  MIRAPEX Take 0.25 mg by mouth 2 (two) times daily.   SUPER B COMPLEX PO Take by mouth.   traZODone 150 MG tablet Commonly known as:  DESYREL Take 150 mg by mouth at bedtime.   Vitamin D3 2000 units Tabs Take 2,000 Units by mouth daily.            Durable Medical Equipment        Start     Ordered   11/30/15 1429  DME Walker  rolling  Once     11/30/15 1428   11/30/15 1429  DME 3 n 1  Once     11/30/15 1428   11/30/15 1429  DME Bedside commode  Once     11/30/15 1428      Diagnostic Studies: Dg Chest 2 View  Result Date: 11/19/2015 CLINICAL DATA:  Preop for hip replacement surgery EXAM: CHEST  2 VIEW COMPARISON:  09/18/2013 FINDINGS: Cardiomediastinal silhouette is stable. No infiltrate or pulmonary edema. Mild degenerative changes thoracic spine. Gastric banding device in proximal stomach again noted. IMPRESSION: No active cardiopulmonary disease. Electronically Signed   By: Natasha MeadLiviu  Pop M.D.   On: 11/19/2015 10:16   Dg C-arm 1-60 Min  Result Date: 11/30/2015 CLINICAL DATA:  Left hip arthroplasty. EXAM: DG C-ARM 61-120 MIN COMPARISON:  01/21/2015. FINDINGS: Total left hip replacement. Hardware intact. Anatomic alignment. Two images obtained. 0 minutes 31 seconds fluoroscopy time. IMPRESSION: Total left hip replacement. Electronically Signed   By: Maisie Fushomas  Register   On: 11/30/2015 12:24   Dg Hip Operative Unilat W Or W/o Pelvis Left  Result Date: 11/30/2015 CLINICAL DATA:  Left total hip arthroplasty. EXAM: OPERATIVE left HIP (WITH PELVIS IF PERFORMED) 1 VIEWS TECHNIQUE: Fluoroscopic spot image(s) were submitted for interpretation post-operatively. COMPARISON:  None. FINDINGS: Frontal view of the left hip shows the patient to be status post total arthroplasty. No evidence for immediate hardware complications on the spot fluoro images. IMPRESSION: Intraoperative assessment during left total hip replacement. Electronically Signed   By: Kennith CenterEric  Mansell M.D.   On: 11/30/2015 12:22    Disposition: 01-Home or Self Care  Discharge Instructions    Call MD / Call 911    Complete by:  As directed    If you experience chest pain or shortness of breath, CALL 911 and be transported to the hospital emergency room.  If you develope a fever above 101 F, pus (white drainage) or increased drainage or redness at the wound, or calf  pain, call your surgeon's office.   Constipation Prevention    Complete by:  As directed    Drink plenty of fluids.  Prune juice may be helpful.  You may use a stool softener, such as Colace (over the counter) 100 mg twice a day.  Use MiraLax (over the counter) for constipation as needed.   Diet - low sodium heart healthy    Complete  by:  As directed    Driving restrictions    Complete by:  As directed    No driving for 2 weeks   Follow the hip precautions as taught in Physical Therapy    Complete by:  As directed    Increase activity slowly as tolerated    Complete by:  As directed    Patient may shower    Complete by:  As directed    You may shower without a dressing once there is no drainage.  Do not wash over the wound.  If drainage remains, cover wound with plastic wrap and then shower.      Follow-up Information    Nestor Lewandowsky, MD Follow up in 2 week(s).   Specialty:  Orthopedic Surgery Contact information: [address] [place] [address] [phone]            Signed: Vear Clock Eddis Pingleton R 12/02/2015, 8:17 AM

## 2015-12-02 NOTE — Progress Notes (Signed)
SW spoke with admissions at [facility]. She states that pt is welcomed to come. SW made nurse aware and also informed her that transportation has been arranged.   SW spoke with pt and made her aware. SW offered to inform family or friend. However, the pt declined.   Crista Curb, MSW

## 2015-12-02 NOTE — Discharge Instructions (Signed)

## 2015-12-02 NOTE — Progress Notes (Signed)
SW has faxed in the required documents to patient's insurance in order to obtain an authorization number.   SW is awaiting a call back from insurance with authorization number. SW spoke with [facility] admissions who states pt is welcomed once authorization number and summary are available.   Crista Curb, MSW

## 2015-12-02 NOTE — Progress Notes (Addendum)
Physical Therapy Treatment Patient Details Name: Paige [name] MRN: [id number] DOB: 11/21/1945 Today's Date: 12/02/2015    History of Present Illness 70 y.o. female admitted to [facility] on 11/30/15 for elective L THA.  Pt with significant PMHx of vertigo, HTN, anemia, bil TKA,     PT Comments    Patient continues to require assistance/supervision for safe mobility. Current plan remains appropriate.   Follow Up Recommendations  SNF (Plans to go to [facility])     Equipment Recommendations  None recommended by PT    Recommendations for Other Services       Precautions / Restrictions Precautions Precautions: None (none ordered) Restrictions Weight Bearing Restrictions: Yes LLE Weight Bearing: Weight bearing as tolerated    Mobility  Bed Mobility               General bed mobility comments: pt OOB in chair upon arrival  Transfers Overall transfer level: Needs assistance Equipment used: Rolling walker (2 wheeled) Transfers: Sit to/from Stand Sit to Stand: Min guard         General transfer comment: from recliner and commode; cues for hand placement; pt with improved eccentric loading today  Ambulation/Gait Ambulation/Gait assistance: Min guard Ambulation Distance (Feet): 80 Feet Assistive device: Rolling walker (2 wheeled) Gait Pattern/deviations: Step-through pattern;Decreased stance time - left;Decreased step length - right;Decreased weight shift to left;Antalgic;Trunk flexed Gait velocity: decreased   General Gait Details: worked on improved step length symmetry and posture this session; pt with slightly antalgic gait but ability to improve step through pattern and WB on L LE with increased distance; cues for posture and bilat heel strike   Stairs            Wheelchair Mobility    Modified Rankin (Stroke Patients Only)       Balance     Sitting balance-Leahy Scale: Good       Standing balance-Leahy Scale: Poor                       Cognition Arousal/Alertness: Awake/alert Behavior During Therapy: WFL for tasks assessed/performed Overall Cognitive Status: Within Functional Limits for tasks assessed                      Exercises Total Joint Exercises Quad Sets: AROM;Both;10 reps;Seated Gluteal Sets: AROM;Both;10 reps;Seated Heel Slides: AROM;Left;10 reps;Seated Hip ABduction/ADduction: AROM;Left;10 reps;Seated Long Arc Quad: AROM;Left;10 reps;Seated    General Comments        Pertinent Vitals/Pain Pain Assessment: 0-10 Pain Score: 5  Pain Location: L hip/thigh with mobility Pain Descriptors / Indicators: Aching;Grimacing;Guarding;Sore;Tightness Pain Intervention(s): Limited activity within patient's tolerance;Monitored during session;Repositioned;Patient requesting pain meds-RN notified    Home Living                      Prior Function            PT Goals (current goals can now be found in the care plan section) Acute Rehab PT Goals Patient Stated Goal: to go to SNF for rehab before returning home unassisted PT Goal Formulation: With patient Time For Goal Achievement: 12/07/15 Potential to Achieve Goals: Good Progress towards PT goals: Progressing toward goals    Frequency    7X/week      PT Plan Current plan remains appropriate    Co-evaluation             End of Session Equipment Utilized During Treatment: Gait belt Activity Tolerance: Patient tolerated  treatment well Patient left: in chair;with call bell/phone within reach;with nursing/sitter in room;Other (comment) (NT helping get [facility])     Time: [id number] PT Time Calculation (min) (ACUTE ONLY): 22 min  Charges:  $Gait Training: 8-22 mins                     G Codes:      Derek MoundKellyn R Starlin Steib Nahun Kronberg, PTA Pager: [phone]   12/02/2015, 3:38 PM

## 2015-12-04 DIAGNOSIS — D509 Iron deficiency anemia, unspecified: Secondary | ICD-10-CM | POA: Diagnosis not present

## 2015-12-04 DIAGNOSIS — G2581 Restless legs syndrome: Secondary | ICD-10-CM | POA: Diagnosis not present

## 2015-12-04 DIAGNOSIS — M1991 Primary osteoarthritis, unspecified site: Secondary | ICD-10-CM | POA: Diagnosis not present

## 2015-12-04 DIAGNOSIS — Z471 Aftercare following joint replacement surgery: Secondary | ICD-10-CM | POA: Diagnosis not present

## 2015-12-10 DIAGNOSIS — G4700 Insomnia, unspecified: Secondary | ICD-10-CM | POA: Diagnosis not present

## 2015-12-10 DIAGNOSIS — I999 Unspecified disorder of circulatory system: Secondary | ICD-10-CM | POA: Diagnosis not present

## 2015-12-10 DIAGNOSIS — S32302D Unspecified fracture of left ilium, subsequent encounter for fracture with routine healing: Secondary | ICD-10-CM | POA: Diagnosis not present

## 2015-12-15 DIAGNOSIS — M1612 Unilateral primary osteoarthritis, left hip: Secondary | ICD-10-CM | POA: Diagnosis not present

## 2015-12-22 DIAGNOSIS — E538 Deficiency of other specified B group vitamins: Secondary | ICD-10-CM | POA: Diagnosis not present

## 2015-12-22 DIAGNOSIS — G2581 Restless legs syndrome: Secondary | ICD-10-CM | POA: Diagnosis not present

## 2015-12-22 DIAGNOSIS — F419 Anxiety disorder, unspecified: Secondary | ICD-10-CM | POA: Diagnosis not present

## 2015-12-22 DIAGNOSIS — R609 Edema, unspecified: Secondary | ICD-10-CM | POA: Diagnosis not present

## 2015-12-22 DIAGNOSIS — E559 Vitamin D deficiency, unspecified: Secondary | ICD-10-CM | POA: Diagnosis not present

## 2015-12-24 ENCOUNTER — Encounter: Payer: Self-pay | Admitting: Physical Therapy

## 2015-12-24 ENCOUNTER — Ambulatory Visit: Payer: Medicare Other | Attending: Orthopedic Surgery | Admitting: Physical Therapy

## 2015-12-24 DIAGNOSIS — M25552 Pain in left hip: Secondary | ICD-10-CM | POA: Diagnosis not present

## 2015-12-24 DIAGNOSIS — R262 Difficulty in walking, not elsewhere classified: Secondary | ICD-10-CM | POA: Insufficient documentation

## 2015-12-24 DIAGNOSIS — M7989 Other specified soft tissue disorders: Secondary | ICD-10-CM | POA: Insufficient documentation

## 2015-12-24 NOTE — Therapy (Signed)
[facility] [address] [place], [place], [address] Phone: [phone]   Fax:  [phone]  Physical Therapy Evaluation  Patient Details  Name: Paige [name] MRN: [id number] Date of Birth: 12/16/1945 Referring Provider: Dr. Turner Daniels  Encounter Date: 12/24/2015      PT End of Session - 12/24/15 0841    Visit Number 1   Date for PT Re-Evaluation 02/18/16   PT Start Time 0800   PT Stop Time 0845   PT Time Calculation (min) 45 min   Activity Tolerance Patient tolerated treatment well;No increased pain   Behavior During Therapy WFL for tasks assessed/performed      Past Medical History:  Diagnosis Date  . Anemia   . Arthritis   . Depression   . GERD (gastroesophageal reflux disease)   . H/O hiatal hernia   . Hypertension    not being treated  . Lymphedema of both lower extremities   . PONV (postoperative nausea and vomiting)    after Cholecysectomy  . Restless legs syndrome   . Vertigo     Past Surgical History:  Procedure Laterality Date  . ABDOMINAL HYSTERECTOMY    . CHOLECYSTECTOMY    . COLONOSCOPY    . EYE SURGERY Bilateral    cataracts  . FRACTURE SURGERY Right    arm age 49  . FRACTURE SURGERY Bilateral    both thumbs  . LAPAROSCOPIC GASTRIC BANDING     2010  . TOTAL HIP ARTHROPLASTY Left 11/30/2015   Procedure: TOTAL HIP ARTHROPLASTY ANTERIOR APPROACH;  Surgeon: Gean Birchwood, MD;  [facility];  Service: Orthopedics;  Laterality: Left;  . TOTAL KNEE ARTHROPLASTY Left 09/25/2013   Procedure: TOTAL KNEE ARTHROPLASTY;  Surgeon: Nestor Lewandowsky, MD;  [facility]: [facility];  Service: Orthopedics;  Laterality: Left;  . TOTAL KNEE ARTHROPLASTY Right 02/10/2014   dr Turner Daniels  . TOTAL KNEE ARTHROPLASTY Right 02/10/2014   Procedure: TOTAL KNEE ARTHROPLASTY;  Surgeon: Nestor Lewandowsky, MD;  [facility] OR;  Service: Orthopedics;  Laterality: Right;    There were no vitals filed for this visit.       Subjective  Assessment - 12/24/15 0803    Subjective Pt underwent toal hip replacement on the left side on Novemeber 6. [facility] in the SNF for a total of 10 days. She presents to the clinic with a single point cane however she does still use her rolling walker at home if she needs to carry things. She puts the items on the walker seat and pushes them. She reports that this surgery has gone much better than either of her total knee suregeries.    Limitations Walking;Lifting   How long can you stand comfortably? 5 minutes   Patient Stated Goals be able to get dressed and put clothes on, increasse ROM, decrease soreness, "walk normally"   Currently in Pain? No/denies  aggs: getting ready for bed, SLR. Eases: percocet, ice,   Multiple Pain Sites No            [facility] PT Assessment - 12/24/15 0001      Assessment   Medical Diagnosis Left total hip   Referring Provider Dr. Turner Daniels   Next MD Visit 01/06/16   Prior Therapy yes     Precautions   Precautions None  ant. approach     Restrictions   Weight Bearing Restrictions No     Balance Screen   Has the patient fallen in the past 6  months No   Has the patient had a decrease in activity level because of a fear of falling?  Yes   Is the patient reluctant to leave their home because of a fear of falling?  No     Home Environment   Living Environment Private residence   Type of Home House     Prior Function   Level of Independence Independent with household mobility with device;Independent with community mobility with device   Vocation Retired   Engineer, maintenanceLeisure crafting, computer     ROM / Strength   AROM / PROM / Strength AROM;Strength     AROM   Overall AROM Comments unable to perform in standing   AROM Assessment Site Hip   Right/Left Hip Right;Left   Left Hip Flexion 55  supine   Left Hip ABduction 30  supine   Left Hip ADduction 15  supine     Palpation   Palpation comment TTP left lateral hip over scar     Transfers   Five time sit to  stand comments  19 secs     Ambulation/Gait   Ambulation/Gait Yes   Ambulation Distance (Feet) 650 Feet  6 minute walk test   Gait Comments ankle inversion, foot pronation bilaterally, walkingwith single point cane     6 Minute Walk- Baseline   6 Minute Walk- Baseline yes  650 feet, without stops                   [facility] Adult PT Treatment/Exercise - 12/24/15 0001      Modalities   Modalities Cryotherapy     Cryotherapy   Number Minutes Cryotherapy 10 Minutes   Cryotherapy Location Hip  left anterior   Type of Cryotherapy Ice pack                PT Education - 12/24/15 0841    Education provided No          PT Short Term Goals - 12/24/15 0852      PT SHORT TERM GOAL #1   Title Independent with HEP   Time 2   Period Weeks   Status New           PT Long Term Goals - 12/24/15 [id number]      PT LONG TERM GOAL #1   Title Pt will be able to perform 6 minute walk test without AD and walk 1000 feet without stopping in order to move towards navigating her community more efficiently.   Time 8   Period Weeks   Status New     PT LONG TERM GOAL #2   Title Pt will improve hip flexion to 90 degrees and be able to perform in a standing posture with AD.   Time 8   Period Weeks   Status New     PT LONG TERM GOAL #3   Title Pt will be able to put her pants on without increasing symptoms in order to perfrom basic ADL more efficiently.   Time 8   Period Weeks   Status New               Plan - 12/24/15 0844    Clinical Impression Statement Pt presents 3 weeks post left ant. total hip surgery. She does not have any precautions. Pt has decrease AROM, decrease balance and decreased mobility with ambulation. Pt unable to perfrom AROM win standing so measurements were taken in supine. Pt voiced that she does not feel stable  enough standing on right leg alone.   Rehab Potential Good   PT Frequency 2x / week   PT Duration 8 weeks   PT  Treatment/Interventions ADLs/Self Care Home Management;Cryotherapy;Electrical Stimulation;Iontophoresis 4mg /ml Dexamethasone;Moist Heat;Ultrasound;Gait training;Stair training;Functional mobility training;Therapeutic activities;Therapeutic exercise;Balance training;Neuromuscular re-education;Patient/family education;Passive range of motion;Manual techniques;Energy conservation   PT Next Visit Plan PROM, balance training, HEP for home stretches   Consulted and Agree with Plan of Care Patient      Patient will benefit from skilled therapeutic intervention in order to improve the following deficits and impairments:  Abnormal gait, Decreased activity tolerance, Decreased balance, Decreased cognition, Decreased endurance, Decreased coordination, Decreased mobility, Decreased strength, Decreased range of motion, Difficulty walking, Hypomobility, Increased edema, Increased muscle spasms, Impaired flexibility, Obesity, Pain  Visit Diagnosis: Left hip pain  Difficulty walking  Swelling of limb     Problem List Patient Active Problem List   Diagnosis Date Noted  . Primary osteoarthritis of left hip 11/26/2015  . Arthritis of right knee 02/10/2014  . Primary osteoarthritis of right knee 02/09/2014  . Arthritis of knee, left 09/25/2013  . Arthritis of knee 09/25/2013    Tamsen Meek, SPT 12/24/2015, 9:17 AM  [facility] [address] [place], [place], [address] Phone: [phone]   Fax:  [phone]  Name: LYANNE SCHUTTER MRN: [id number] Date of Birth: Jan 09, 1946

## 2015-12-25 DIAGNOSIS — E559 Vitamin D deficiency, unspecified: Secondary | ICD-10-CM | POA: Diagnosis not present

## 2015-12-28 ENCOUNTER — Ambulatory Visit: Payer: Medicare Other | Attending: Orthopedic Surgery | Admitting: Physical Therapy

## 2015-12-28 ENCOUNTER — Encounter: Payer: Self-pay | Admitting: Physical Therapy

## 2015-12-28 DIAGNOSIS — M629 Disorder of muscle, unspecified: Secondary | ICD-10-CM | POA: Insufficient documentation

## 2015-12-28 DIAGNOSIS — M25661 Stiffness of right knee, not elsewhere classified: Secondary | ICD-10-CM | POA: Diagnosis not present

## 2015-12-28 DIAGNOSIS — M7989 Other specified soft tissue disorders: Secondary | ICD-10-CM | POA: Insufficient documentation

## 2015-12-28 DIAGNOSIS — M79606 Pain in leg, unspecified: Secondary | ICD-10-CM | POA: Diagnosis not present

## 2015-12-28 DIAGNOSIS — M25552 Pain in left hip: Secondary | ICD-10-CM

## 2015-12-28 DIAGNOSIS — R262 Difficulty in walking, not elsewhere classified: Secondary | ICD-10-CM | POA: Diagnosis not present

## 2015-12-28 NOTE — Therapy (Signed)
[facility] [address] [place], [place], [address] Phone: [phone]   Fax:  [phone]  Physical Therapy Treatment  Patient Details  Name: Paige [name] MRN: [id number] Date of Birth: [birth year] Referring Provider: Dr. Turner Daniels  Encounter Date: 12/28/2015      PT End of Session - 12/28/15 0842    Visit Number 2   Date for PT Re-Evaluation 02/18/16   PT Start Time 0759   PT Stop Time 0844   PT Time Calculation (min) 45 min   Activity Tolerance Patient tolerated treatment well;No increased pain   Behavior During Therapy WFL for tasks assessed/performed      Past Medical History:  Diagnosis Date  . Anemia   . Arthritis   . Depression   . GERD (gastroesophageal reflux disease)   . H/O hiatal hernia   . Hypertension    not being treated  . Lymphedema of both lower extremities   . PONV (postoperative nausea and vomiting)    after Cholecysectomy  . Restless legs syndrome   . Vertigo     Past Surgical History:  Procedure Laterality Date  . ABDOMINAL HYSTERECTOMY    . CHOLECYSTECTOMY    . COLONOSCOPY    . EYE SURGERY Bilateral    cataracts  . FRACTURE SURGERY Right    arm age 70  . FRACTURE SURGERY Bilateral    both thumbs  . LAPAROSCOPIC GASTRIC BANDING     2010  . TOTAL HIP ARTHROPLASTY Left 11/30/2015   Procedure: TOTAL HIP ARTHROPLASTY ANTERIOR APPROACH;  Surgeon: Gean Birchwood, MD;  [facility];  Service: Orthopedics;  Laterality: Left;  . TOTAL KNEE ARTHROPLASTY Left 09/25/2013   Procedure: TOTAL KNEE ARTHROPLASTY;  Surgeon: Nestor Lewandowsky, MD;  [facility];  Service: Orthopedics;  Laterality: Left;  . TOTAL KNEE ARTHROPLASTY Right 02/10/2014   dr Turner Daniels  . TOTAL KNEE ARTHROPLASTY Right 02/10/2014   Procedure: TOTAL KNEE ARTHROPLASTY;  Surgeon: Nestor Lewandowsky, MD;  [facility];  Service: Orthopedics;  Laterality: Right;    There were no vitals filed for this visit.      Subjective  Assessment - 12/28/15 0802    Subjective I am feeling pretty good.  Trying to walk without the cane some, I am not as steady as I want to be.   Currently in Pain? No/denies                         [facility] Adult PT Treatment/Exercise - 12/28/15 0001      High Level Balance   High Level Balance Comments resisted gait all directions     Exercises   Exercises Knee/Hip     Knee/Hip Exercises: Stretches   Passive Hamstring Stretch 3 reps;20 seconds   Quad Stretch 3 reps;20 seconds   Hip Flexor Stretch 3 reps;20 seconds   Gastroc Stretch 3 reps;20 seconds     Knee/Hip Exercises: Aerobic   Nustep Level 5 x 6 minutes     Knee/Hip Exercises: Machines for Strengthening   Cybex Knee Extension 10# 2x15   Cybex Knee Flexion 25# 2x15   Cybex Leg Press 20# 2x10, then no weight with left leg only 2x10     Knee/Hip Exercises: Standing   Hip Flexion 2 sets;10 reps   Hip Flexion Limitations 3#   Hip Abduction 2 sets;10 reps   Abduction Limitations 3#  PT Short Term Goals - 12/24/15 [id number]      PT SHORT TERM GOAL #1   Title Independent with HEP   Time 2   Period Weeks   Status New           PT Long Term Goals - 12/24/15 [id number]      PT LONG TERM GOAL #1   Title Pt will be able to perform 6 minute walk test without AD and walk 1000 feet without stopping in order to move towards navigating her community more efficiently.   Time 8   Period Weeks   Status New     PT LONG TERM GOAL #2   Title Pt will improve hip flexion to 90 degrees and be able to perform in a standing posture with AD.   Time 8   Period Weeks   Status New     PT LONG TERM GOAL #3   Title Pt will be able to put her pants on without increasing symptoms in order to perfrom basic ADL more efficiently.   Time 8   Period Weeks   Status New               Plan - 12/28/15 [id number]    Clinical Impression Statement Patient doing very well, she is walking very slow trying to  not use a cane.  Very tight calves.  Patient wants to work on getting up from the floor in the future.   PT Next Visit Plan Work on strength and balance   Consulted and Agree with Plan of Care Patient      Patient will benefit from skilled therapeutic intervention in order to improve the following deficits and impairments:  Abnormal gait, Decreased activity tolerance, Decreased balance, Decreased cognition, Decreased endurance, Decreased coordination, Decreased mobility, Decreased strength, Decreased range of motion, Difficulty walking, Hypomobility, Increased edema, Increased muscle spasms, Impaired flexibility, Obesity, Pain  Visit Diagnosis: Left hip pain  Difficulty walking     Problem List Patient Active Problem List   Diagnosis Date Noted  . Primary osteoarthritis of left hip 11/26/2015  . Arthritis of right knee 02/10/2014  . Primary osteoarthritis of right knee 02/09/2014  . Arthritis of knee, left 09/25/2013  . Arthritis of knee 09/25/2013    Jearld LeschALBRIGHT,Deysy Schabel W., PT 12/28/2015, 8:44 AM  [facility] [address] [place], [place], [address] Phone: [phone]   Fax:  [phone]  Name: Lavonda JumboReoma E Findlay MRN: [id number] Date of Birth: [birth year]

## 2015-12-31 ENCOUNTER — Ambulatory Visit: Payer: Medicare Other | Admitting: Physical Therapy

## 2015-12-31 ENCOUNTER — Encounter: Payer: Self-pay | Admitting: Physical Therapy

## 2015-12-31 DIAGNOSIS — M25552 Pain in left hip: Secondary | ICD-10-CM

## 2015-12-31 DIAGNOSIS — R262 Difficulty in walking, not elsewhere classified: Secondary | ICD-10-CM

## 2015-12-31 NOTE — Therapy (Signed)
[facility] [address] [place], [place], [address] Phone: [phone]   Fax:  [phone]  Physical Therapy Treatment  Patient Details  Name: Paige [name] MRN: [id number] Date of Birth: 1945-05-10 Referring Provider: Dr. Turner Daniels  Encounter Date: 12/31/2015      PT End of Session - 12/31/15 0944    Visit Number 3   Date for PT Re-Evaluation 02/18/16   PT Start Time 0850   PT Stop Time 0940   PT Time Calculation (min) 50 min      Past Medical History:  Diagnosis Date  . Anemia   . Arthritis   . Depression   . GERD (gastroesophageal reflux disease)   . H/O hiatal hernia   . Hypertension    not being treated  . Lymphedema of both lower extremities   . PONV (postoperative nausea and vomiting)    after Cholecysectomy  . Restless legs syndrome   . Vertigo     Past Surgical History:  Procedure Laterality Date  . ABDOMINAL HYSTERECTOMY    . CHOLECYSTECTOMY    . COLONOSCOPY    . EYE SURGERY Bilateral    cataracts  . FRACTURE SURGERY Right    arm age 66  . FRACTURE SURGERY Bilateral    both thumbs  . LAPAROSCOPIC GASTRIC BANDING     2010  . TOTAL HIP ARTHROPLASTY Left 11/30/2015   Procedure: TOTAL HIP ARTHROPLASTY ANTERIOR APPROACH;  Surgeon: Gean Birchwood, MD;  [facility];  Service: Orthopedics;  Laterality: Left;  . TOTAL KNEE ARTHROPLASTY Left 09/25/2013   Procedure: TOTAL KNEE ARTHROPLASTY;  Surgeon: Nestor Lewandowsky, MD;  [facility];  Service: Orthopedics;  Laterality: Left;  . TOTAL KNEE ARTHROPLASTY Right 02/10/2014   dr Turner Daniels  . TOTAL KNEE ARTHROPLASTY Right 02/10/2014   Procedure: TOTAL KNEE ARTHROPLASTY;  Surgeon: Nestor Lewandowsky, MD;  [facility];  Service: Orthopedics;  Laterality: Right;    There were no vitals filed for this visit.      Subjective Assessment - 12/31/15 0846    Subjective amb without cane, doing well byt alittle "clunky". hip is great knees are another issue.   Currently in Pain? No/denies                         [facility] Adult PT Treatment/Exercise - 12/31/15 0001      Knee/Hip Exercises: Stretches   Passive Hamstring Stretch 3 reps;20 seconds   Quad Stretch 3 reps;20 seconds   Gastroc Stretch 3 reps;20 seconds     Knee/Hip Exercises: Aerobic   Nustep Level 5 x 9 minutes     Knee/Hip Exercises: Machines for Strengthening   Cybex Knee Extension 10# 2x15  5# LLE 10 times   Cybex Knee Flexion 25# 2x15   Cybex Leg Press 30# 2 sets 15   Other Machine black bar 20 times DF/PF     Knee/Hip Exercises: Standing   Lateral Step Up Both;1 set;Hand Hold: 2;Step Height: 6";10 reps  opp hip abd   Forward Step Up Both;15 reps;Hand Hold: 2;Step Height: 6"  opp hip ext     Knee/Hip Exercises: Seated   Long Arc Quad Strengthening;Both;15 reps  green tband   Ball Squeeze 20   Knee/Hip Flexion green tband 15 both   Hamstring Curl Strengthening;Both;15 reps  green tband   Abduction/Adduction  Strengthening;Both;20 reps  gren tband  PT Short Term Goals - 12/31/15 0946      PT SHORT TERM GOAL #1   Title Independent with HEP   Status Achieved           PT Long Term Goals - 12/24/15 [id number]      PT LONG TERM GOAL #1   Title Pt will be able to perform 6 minute walk test without AD and walk 1000 feet without stopping in order to move towards navigating her community more efficiently.   Time 8   Period Weeks   Status New     PT LONG TERM GOAL #2   Title Pt will improve hip flexion to 90 degrees and be able to perform in a standing posture with AD.   Time 8   Period Weeks   Status New     PT LONG TERM GOAL #3   Title Pt will be able to put her pants on without increasing symptoms in order to perfrom basic ADL more efficiently.   Time 8   Period Weeks   Status New               Plan - 12/31/15 0945    Clinical Impression Statement pt doing very well, some initiat soreness and increased  antalgia with rising to walk. slight limp. Weakness with lateral hip mvmt and c/o calf tightness.   PT Next Visit Plan Work on strength and balance      Patient will benefit from skilled therapeutic intervention in order to improve the following deficits and impairments:  Abnormal gait, Decreased activity tolerance, Decreased balance, Decreased cognition, Decreased endurance, Decreased coordination, Decreased mobility, Decreased strength, Decreased range of motion, Difficulty walking, Hypomobility, Increased edema, Increased muscle spasms, Impaired flexibility, Obesity, Pain  Visit Diagnosis: Left hip pain  Difficulty walking     Problem List Patient Active Problem List   Diagnosis Date Noted  . Primary osteoarthritis of left hip 11/26/2015  . Arthritis of right knee 02/10/2014  . Primary osteoarthritis of right knee 02/09/2014  . Arthritis of knee, left 09/25/2013  . Arthritis of knee 09/25/2013    Brecklyn Galvis,ANGIE PTA 12/31/2015, 9:46 AM  [facility] [address] [place], [place], [address] Phone: [phone]   Fax:  [phone]  Name: Lavonda JumboReoma E Soland MRN: [id number] Date of Birth: Jul 27, 1945

## 2016-01-01 DIAGNOSIS — E559 Vitamin D deficiency, unspecified: Secondary | ICD-10-CM | POA: Diagnosis not present

## 2016-01-05 ENCOUNTER — Ambulatory Visit: Payer: Medicare Other | Admitting: Physical Therapy

## 2016-01-05 ENCOUNTER — Encounter: Payer: Self-pay | Admitting: Physical Therapy

## 2016-01-05 DIAGNOSIS — M25552 Pain in left hip: Secondary | ICD-10-CM | POA: Diagnosis not present

## 2016-01-05 DIAGNOSIS — R262 Difficulty in walking, not elsewhere classified: Secondary | ICD-10-CM

## 2016-01-05 NOTE — Therapy (Signed)
[facility] [address] [place], [place], [address] Phone: [phone]   Fax:  [phone]  Physical Therapy Treatment  Patient Details  Name: Paige [name] MRN: [id number] Date of Birth: 10-28-1945 Referring Provider: Dr. Turner Danielsowan  Encounter Date: 01/05/2016      PT End of Session - 01/05/16 0839    Visit Number 4   Date for PT Re-Evaluation 02/18/16   PT Start Time 0800   PT Stop Time 0842   PT Time Calculation (min) 42 min      Past Medical History:  Diagnosis Date  . Anemia   . Arthritis   . Depression   . GERD (gastroesophageal reflux disease)   . H/O hiatal hernia   . Hypertension    not being treated  . Lymphedema of both lower extremities   . PONV (postoperative nausea and vomiting)    after Cholecysectomy  . Restless legs syndrome   . Vertigo     Past Surgical History:  Procedure Laterality Date  . ABDOMINAL HYSTERECTOMY    . CHOLECYSTECTOMY    . COLONOSCOPY    . EYE SURGERY Bilateral    cataracts  . FRACTURE SURGERY Right    arm age 89  . FRACTURE SURGERY Bilateral    both thumbs  . LAPAROSCOPIC GASTRIC BANDING     2010  . TOTAL HIP ARTHROPLASTY Left 11/30/2015   Procedure: TOTAL HIP ARTHROPLASTY ANTERIOR APPROACH;  Surgeon: Gean BirchwoodFrank Rowan, MD;  [facility];  Service: Orthopedics;  Laterality: Left;  . TOTAL KNEE ARTHROPLASTY Left 09/25/2013   Procedure: TOTAL KNEE ARTHROPLASTY;  Surgeon: Nestor LewandowskyFrank J Rowan, MD;  [facility];  Service: Orthopedics;  Laterality: Left;  . TOTAL KNEE ARTHROPLASTY Right 02/10/2014   dr Turner Danielsrowan  . TOTAL KNEE ARTHROPLASTY Right 02/10/2014   Procedure: TOTAL KNEE ARTHROPLASTY;  Surgeon: Nestor LewandowskyFrank J Rowan, MD;  [facility];  Service: Orthopedics;  Laterality: Right;    There were no vitals filed for this visit.      Subjective Assessment - 01/05/16 0803    Subjective hip is doing well, knees are issue   Currently in Pain? No/denies                          [facility] Adult PT Treatment/Exercise - 01/05/16 0001      Knee/Hip Exercises: Aerobic   Nustep L 5 7 min     Knee/Hip Exercises: Machines for Strengthening   Cybex Knee Extension 10# 2x15   Cybex Knee Flexion 25# 2x15   Cybex Leg Press 30# 2 sets 15  30# 2 sets 10     Knee/Hip Exercises: Standing   Other Standing Knee Exercises 5# 15 reps hip 4 ways     Knee/Hip Exercises: Seated   Sit to Sand 15 reps;without UE support  wt ball                  PT Short Term Goals - 12/31/15 0946      PT SHORT TERM GOAL #1   Title Independent with HEP   Status Achieved           PT Long Term Goals - 12/24/15 [id number]      PT LONG TERM GOAL #1   Title Pt will be able to perform 6 minute walk test without AD and walk 1000 feet without stopping in order to move towards navigating her community more efficiently.   Time  8   Period Weeks   Status New     PT LONG TERM GOAL #2   Title Pt will improve hip flexion to 90 degrees and be able to perform in a standing posture with AD.   Time 8   Period Weeks   Status New     PT LONG TERM GOAL #3   Title Pt will be able to put her pants on without increasing symptoms in order to perfrom basic ADL more efficiently.   Time 8   Period Weeks   Status New               Plan - 01/05/16 0840    Clinical Impression Statement increased standing ex today and pt did well but fatigued very easily. pt verb no hip pain but c/o knees still an issue.   PT Next Visit Plan Work on strength and balance      Patient will benefit from skilled therapeutic intervention in order to improve the following deficits and impairments:  Abnormal gait, Decreased activity tolerance, Decreased balance, Decreased cognition, Decreased endurance, Decreased coordination, Decreased mobility, Decreased strength, Decreased range of motion, Difficulty walking, Hypomobility, Increased edema, Increased muscle spasms, Impaired  flexibility, Obesity, Pain  Visit Diagnosis: Left hip pain  Difficulty walking     Problem List Patient Active Problem List   Diagnosis Date Noted  . Primary osteoarthritis of left hip 11/26/2015  . Arthritis of right knee 02/10/2014  . Primary osteoarthritis of right knee 02/09/2014  . Arthritis of knee, left 09/25/2013  . Arthritis of knee 09/25/2013    Paige [name],ANGIE PTA 01/05/2016, 8:41 AM  [facility] [address] [place], [place], [address] Phone: [phone]   Fax:  [phone]  Name: Paige [name] MRN: [id number] Date of Birth: [birth year]

## 2016-01-07 ENCOUNTER — Encounter: Payer: Self-pay | Admitting: Physical Therapy

## 2016-01-07 ENCOUNTER — Ambulatory Visit: Payer: Medicare Other | Admitting: Physical Therapy

## 2016-01-07 DIAGNOSIS — M25552 Pain in left hip: Secondary | ICD-10-CM

## 2016-01-07 DIAGNOSIS — M25661 Stiffness of right knee, not elsewhere classified: Secondary | ICD-10-CM

## 2016-01-07 DIAGNOSIS — R262 Difficulty in walking, not elsewhere classified: Secondary | ICD-10-CM

## 2016-01-07 DIAGNOSIS — M7989 Other specified soft tissue disorders: Secondary | ICD-10-CM

## 2016-01-07 DIAGNOSIS — M629 Disorder of muscle, unspecified: Secondary | ICD-10-CM

## 2016-01-07 DIAGNOSIS — M79606 Pain in leg, unspecified: Secondary | ICD-10-CM

## 2016-01-07 NOTE — Therapy (Signed)
[facility] [address] [place], [place], [address] Phone: [phone]   Fax:  [phone]  Physical Therapy Treatment  Patient Details  Name: Paige [name] MRN: [id number] Date of Birth: [birth year] Referring Provider: Dr. Turner Daniels  Encounter Date: 01/07/2016      PT End of Session - 01/07/16 0844    Visit Number 5   Date for PT Re-Evaluation 02/18/16   PT Start Time 0800   PT Stop Time 0845   PT Time Calculation (min) 45 min   Activity Tolerance Patient tolerated treatment well;No increased pain   Behavior During Therapy WFL for tasks assessed/performed      Past Medical History:  Diagnosis Date  . Anemia   . Arthritis   . Depression   . GERD (gastroesophageal reflux disease)   . H/O hiatal hernia   . Hypertension    not being treated  . Lymphedema of both lower extremities   . PONV (postoperative nausea and vomiting)    after Cholecysectomy  . Restless legs syndrome   . Vertigo     Past Surgical History:  Procedure Laterality Date  . ABDOMINAL HYSTERECTOMY    . CHOLECYSTECTOMY    . COLONOSCOPY    . EYE SURGERY Bilateral    cataracts  . FRACTURE SURGERY Right    arm age 27  . FRACTURE SURGERY Bilateral    both thumbs  . LAPAROSCOPIC GASTRIC BANDING     2010  . TOTAL HIP ARTHROPLASTY Left 11/30/2015   Procedure: TOTAL HIP ARTHROPLASTY ANTERIOR APPROACH;  Surgeon: Gean Birchwood, MD;  [facility];  Service: Orthopedics;  Laterality: Left;  . TOTAL KNEE ARTHROPLASTY Left 09/25/2013   Procedure: TOTAL KNEE ARTHROPLASTY;  Surgeon: Nestor Lewandowsky, MD;  [facility];  Service: Orthopedics;  Laterality: Left;  . TOTAL KNEE ARTHROPLASTY Right 02/10/2014   dr Turner Daniels  . TOTAL KNEE ARTHROPLASTY Right 02/10/2014   Procedure: TOTAL KNEE ARTHROPLASTY;  Surgeon: Nestor Lewandowsky, MD;  [facility];  Service: Orthopedics;  Laterality: Right;    There were no vitals filed for this visit.      Subjective  Assessment - 01/07/16 0758    Subjective Pt states she is doing well. She says that she walked about an hour and a half on Tuesday at [facility]. She did not exprience any pain. She is not having any pain today.   Currently in Pain? No/denies                         [facility] Adult PT Treatment/Exercise - 01/07/16 0001      High Level Balance   High Level Balance Activities Other (comment)   High Level Balance Comments static stand on airex pad, eyes open and eyes closed, 3x30 sec     Knee/Hip Exercises: Aerobic   Nustep Lvl 6, 6 minutes     Knee/Hip Exercises: Machines for Strengthening   Cybex Knee Extension #15, 2x15   Cybex Knee Flexion #35, 2x15   Cybex Leg Press 40lb, 2x15     Knee/Hip Exercises: Standing   Hip Abduction Stengthening;Left;2 sets;20 reps  2lb ankle weight   Forward Step Up 2 sets;Left;Right;10 reps;Step Height: 6"  with FWW     Knee/Hip Exercises: Seated   Heel Slides Strengthening;Left;2 sets;15 reps  Fitter sliding board, full resistance applied     Manual Therapy   Manual Therapy Passive ROM   Passive ROM gastroc stretch, HS  stretch, [facility]                PT Education - 01/07/16 0844    Education provided No          PT Short Term Goals - 12/31/15 0946      PT SHORT TERM GOAL #1   Title Independent with HEP   Status Achieved           PT Long Term Goals - 12/24/15 0852      PT LONG TERM GOAL #1   Title Pt will be able to perform 6 minute walk test without AD and walk 1000 feet without stopping in order to move towards navigating her community more efficiently.   Time 8   Period Weeks   Status New     PT LONG TERM GOAL #2   Title Pt will improve hip flexion to 90 degrees and be able to perform in a standing posture with AD.   Time 8   Period Weeks   Status New     PT LONG TERM GOAL #3   Title Pt will be able to put her pants on without increasing symptoms in order to perfrom basic ADL more efficiently.   Time 8    Period Weeks   Status New               Plan - 01/07/16 0844    Clinical Impression Statement Pt toelrated tretament well. Pt did not report any increase in pain. Pt did report difficulty with static stand on airex pad with eyes closed. Min assist was required for static strand exercise. Continue to progress per pt tolerance   Rehab Potential Good   PT Frequency 2x / week   PT Duration 8 weeks   PT Treatment/Interventions ADLs/Self Care Home Management;Cryotherapy;Electrical Stimulation;Iontophoresis 4mg /ml Dexamethasone;Moist Heat;Ultrasound;Gait training;Stair training;Functional mobility training;Therapeutic activities;Therapeutic exercise;Balance training;Neuromuscular re-education;Patient/family education;Passive range of motion;Manual techniques;Energy conservation   PT Next Visit Plan Work on strength and balance, Give HEP for LE stretches   Consulted and Agree with Plan of Care Patient      Patient will benefit from skilled therapeutic intervention in order to improve the following deficits and impairments:  Abnormal gait, Decreased activity tolerance, Decreased balance, Decreased cognition, Decreased endurance, Decreased coordination, Decreased mobility, Decreased strength, Decreased range of motion, Difficulty walking, Hypomobility, Increased edema, Increased muscle spasms, Impaired flexibility, Obesity, Pain  Visit Diagnosis: Left hip pain  Difficulty walking  Swelling of limb  Pain of lower extremity, unspecified laterality  Hamstring tightness of both lower extremities  Knee stiffness, right     Problem List Patient Active Problem List   Diagnosis Date Noted  . Primary osteoarthritis of left hip 11/26/2015  . Arthritis of right knee 02/10/2014  . Primary osteoarthritis of right knee 02/09/2014  . Arthritis of knee, left 09/25/2013  . Arthritis of knee 09/25/2013    Tamsen MeekBrian Cade Lum Stillinger, SPT 01/07/2016, 8:46 AM  [facility] [address] [place], [place], [address] Phone: [phone]   Fax:  [phone]  Name: Lavonda JumboReoma E Duhe MRN: [id number] Date of Birth: May 29, 1945

## 2016-01-12 ENCOUNTER — Encounter: Payer: Self-pay | Admitting: Physical Therapy

## 2016-01-12 ENCOUNTER — Ambulatory Visit: Payer: Medicare Other | Admitting: Physical Therapy

## 2016-01-12 DIAGNOSIS — M79606 Pain in leg, unspecified: Secondary | ICD-10-CM

## 2016-01-12 DIAGNOSIS — M25552 Pain in left hip: Secondary | ICD-10-CM

## 2016-01-12 DIAGNOSIS — R262 Difficulty in walking, not elsewhere classified: Secondary | ICD-10-CM

## 2016-01-12 DIAGNOSIS — M629 Disorder of muscle, unspecified: Secondary | ICD-10-CM

## 2016-01-12 NOTE — Therapy (Signed)
[facility] [address] [place], [place], [address] Phone: [phone]   Fax:  [phone]  Physical Therapy Treatment  Patient Details  Name: KIYONA SCHECHTER MRN: [id number] Date of Birth: 09-06-1945 Referring Provider: Dr. Turner Daniels  Encounter Date: 01/12/2016      PT End of Session - 01/12/16 1014    Visit Number 6   Date for PT Re-Evaluation 02/18/16   PT Start Time 0930   PT Stop Time 1020   PT Time Calculation (min) 50 min   Activity Tolerance Patient tolerated treatment well;No increased pain   Behavior During Therapy WFL for tasks assessed/performed      Past Medical History:  Diagnosis Date  . Anemia   . Arthritis   . Depression   . GERD (gastroesophageal reflux disease)   . H/O hiatal hernia   . Hypertension    not being treated  . Lymphedema of both lower extremities   . PONV (postoperative nausea and vomiting)    after Cholecysectomy  . Restless legs syndrome   . Vertigo     Past Surgical History:  Procedure Laterality Date  . ABDOMINAL HYSTERECTOMY    . CHOLECYSTECTOMY    . COLONOSCOPY    . EYE SURGERY Bilateral    cataracts  . FRACTURE SURGERY Right    arm age 70  . FRACTURE SURGERY Bilateral    both thumbs  . LAPAROSCOPIC GASTRIC BANDING     2010  . TOTAL HIP ARTHROPLASTY Left 11/30/2015   Procedure: TOTAL HIP ARTHROPLASTY ANTERIOR APPROACH;  Surgeon: Gean Birchwood, MD;  [facility];  Service: Orthopedics;  Laterality: Left;  . TOTAL KNEE ARTHROPLASTY Left 09/25/2013   Procedure: TOTAL KNEE ARTHROPLASTY;  Surgeon: Nestor Lewandowsky, MD;  [facility];  Service: Orthopedics;  Laterality: Left;  . TOTAL KNEE ARTHROPLASTY Right 02/10/2014   dr Turner Daniels  . TOTAL KNEE ARTHROPLASTY Right 02/10/2014   Procedure: TOTAL KNEE ARTHROPLASTY;  Surgeon: Nestor Lewandowsky, MD;  [facility];  Service: Orthopedics;  Laterality: Right;    There were no vitals filed for this visit.      Subjective  Assessment - 01/12/16 0943    Subjective Pt states she is doing well. She states that the leg is a little stiff today because she did alot of walking yesterday at [employer].     Currently in Pain? No/denies                         [facility] Adult PT Treatment/Exercise - 01/12/16 0001      Knee/Hip Exercises: Stretches   Gastroc Stretch 3 reps;20 seconds;Left;Right     Knee/Hip Exercises: Aerobic   Nustep 8 min, lvl 5     Knee/Hip Exercises: Machines for Strengthening   Cybex Knee Extension 15 lb, 2x15   Cybex Knee Flexion 35 lb, 2x15     Knee/Hip Exercises: Standing   Hip Abduction Stengthening;Left;1 set;20 reps   Hip Extension Stengthening;Left;1 set;20 reps   Lateral Step Up Left;1 set;20 reps   Forward Step Up Left;1 set;20 reps     Modalities   Modalities Moist Heat     Moist Heat Therapy   Number Minutes Moist Heat 10 Minutes   Moist Heat Location Hip     Manual Therapy   Manual Therapy Passive ROM   Passive ROM gastroc, HS, [facility] stretch  PT Education - 01/12/16 1013    Education provided No          PT Short Term Goals - 12/31/15 0946      PT SHORT TERM GOAL #1   Title Independent with HEP   Status Achieved           PT Long Term Goals - 01/12/16 1016      PT LONG TERM GOAL #1   Status On-going     PT LONG TERM GOAL #2   Status On-going               Plan - 01/12/16 1014    Clinical Impression Statement Although pt reported increased stiffness this morning, she tolerated treatment well and was able to complete all exercises. Progress per pt tolerance and continue hip strengthening in standing position and increase aerobic exercise resistence.   Rehab Potential Good   PT Frequency 2x / week   PT Duration 8 weeks   PT Treatment/Interventions ADLs/Self Care Home Management;Cryotherapy;Electrical Stimulation;Iontophoresis 4mg /ml Dexamethasone;Moist Heat;Ultrasound;Gait training;Stair  training;Functional mobility training;Therapeutic activities;Therapeutic exercise;Balance training;Neuromuscular re-education;Patient/family education;Passive range of motion;Manual techniques;Energy conservation   PT Next Visit Plan LE strengthening and stretches   PT Home Exercise Plan LE stretches   Consulted and Agree with Plan of Care Patient      Patient will benefit from skilled therapeutic intervention in order to improve the following deficits and impairments:  Abnormal gait, Decreased activity tolerance, Decreased balance, Decreased cognition, Decreased endurance, Decreased coordination, Decreased mobility, Decreased strength, Decreased range of motion, Difficulty walking, Hypomobility, Increased edema, Increased muscle spasms, Impaired flexibility, Obesity, Pain  Visit Diagnosis: Left hip pain  Difficulty walking  Pain of lower extremity, unspecified laterality  Hamstring tightness of both lower extremities     Problem List Patient Active Problem List   Diagnosis Date Noted  . Primary osteoarthritis of left hip 11/26/2015  . Arthritis of right knee 02/10/2014  . Primary osteoarthritis of right knee 02/09/2014  . Arthritis of knee, left 09/25/2013  . Arthritis of knee 09/25/2013    Tamsen MeekBrian Cade Yarexi Pawlicki, SPT 01/12/2016, 10:25 AM  [facility] [address] [place], [place], [address] Phone: [phone]   Fax:  [phone]  Name: Lavonda JumboReoma E Eoff MRN: [id number] Date of Birth: [birth year]

## 2016-01-14 ENCOUNTER — Ambulatory Visit: Payer: Medicare Other | Admitting: Physical Therapy

## 2016-01-20 ENCOUNTER — Ambulatory Visit: Payer: Medicare Other | Admitting: Physical Therapy

## 2016-01-20 ENCOUNTER — Encounter: Payer: Self-pay | Admitting: Physical Therapy

## 2016-01-20 DIAGNOSIS — M25552 Pain in left hip: Secondary | ICD-10-CM

## 2016-01-20 DIAGNOSIS — M629 Disorder of muscle, unspecified: Secondary | ICD-10-CM

## 2016-01-20 DIAGNOSIS — R262 Difficulty in walking, not elsewhere classified: Secondary | ICD-10-CM

## 2016-01-20 NOTE — Therapy (Signed)
[facility] [address] [place], [place], [address] Phone: [phone]   Fax:  [phone]  Physical Therapy Treatment  Patient Details  Name: Paige [name] MRN: [id number] Date of Birth: [birth year] Referring Provider: Dr. Turner Daniels  Encounter Date: 01/20/2016      PT End of Session - 01/20/16 1225    Visit Number 7   Date for PT Re-Evaluation 02/18/16   PT Start Time 1152   PT Stop Time 1232   PT Time Calculation (min) 40 min   Activity Tolerance Patient tolerated treatment well;No increased pain   Behavior During Therapy WFL for tasks assessed/performed      Past Medical History:  Diagnosis Date  . Anemia   . Arthritis   . Depression   . GERD (gastroesophageal reflux disease)   . H/O hiatal hernia   . Hypertension    not being treated  . Lymphedema of both lower extremities   . PONV (postoperative nausea and vomiting)    after Cholecysectomy  . Restless legs syndrome   . Vertigo     Past Surgical History:  Procedure Laterality Date  . ABDOMINAL HYSTERECTOMY    . CHOLECYSTECTOMY    . COLONOSCOPY    . EYE SURGERY Bilateral    cataracts  . FRACTURE SURGERY Right    arm age 14  . FRACTURE SURGERY Bilateral    both thumbs  . LAPAROSCOPIC GASTRIC BANDING     2010  . TOTAL HIP ARTHROPLASTY Left 11/30/2015   Procedure: TOTAL HIP ARTHROPLASTY ANTERIOR APPROACH;  Surgeon: Gean Birchwood, MD;  [facility];  Service: Orthopedics;  Laterality: Left;  . TOTAL KNEE ARTHROPLASTY Left 09/25/2013   Procedure: TOTAL KNEE ARTHROPLASTY;  Surgeon: Nestor Lewandowsky, MD;  [facility];  Service: Orthopedics;  Laterality: Left;  . TOTAL KNEE ARTHROPLASTY Right 02/10/2014   dr Turner Daniels  . TOTAL KNEE ARTHROPLASTY Right 02/10/2014   Procedure: TOTAL KNEE ARTHROPLASTY;  Surgeon: Nestor Lewandowsky, MD;  [facility];  Service: Orthopedics;  Laterality: Right;    There were no vitals filed for this visit.      Subjective  Assessment - 01/20/16 1150    Subjective "I have a cold, I have pain down the side of my leg"   Currently in Pain? Yes   Pain Score 1    Pain Location Leg   Pain Orientation Left;Lateral                         [facility] Adult PT Treatment/Exercise - 01/20/16 0001      Knee/Hip Exercises: Aerobic   Nustep 8 min, lvl 5     Knee/Hip Exercises: Machines for Strengthening   Cybex Knee Extension 15 lb, 2x15   Cybex Knee Flexion 35 lb, 2x15     Knee/Hip Exercises: Standing   Hip Abduction Stengthening;Left;2 sets;10 reps;Knee straight  with RW   Forward Step Up Left;Both;2 sets;10 reps;Hand Hold: 2;Step Height: 6"  RW     Knee/Hip Exercises: Seated   Sit to Sand 2 sets;10 reps  UE support on knees      Modalities   Modalities Moist Heat     Moist Heat Therapy   Number Minutes Moist Heat 10 Minutes   Moist Heat Location Hip                  PT Short Term Goals - 12/31/15 3291      PT  SHORT TERM GOAL #1   Title Independent with HEP   Status Achieved           PT Long Term Goals - 01/12/16 1016      PT LONG TERM GOAL #1   Status On-going     PT LONG TERM GOAL #2   Status On-going               Plan - 01/20/16 1226    Clinical Impression Statement Pt enters clinic with a cold and fatigued. Shortened treatment session due to pt not feeling well. Pt able to complete all of today's exercises but required increase rest breaks due to fatigue. No increase in pain reported with today's activity.   Rehab Potential Good   PT Frequency 2x / week   PT Duration 8 weeks   PT Treatment/Interventions ADLs/Self Care Home Management;Cryotherapy;Electrical Stimulation;Iontophoresis 4mg /ml Dexamethasone;Moist Heat;Ultrasound;Gait training;Stair training;Functional mobility training;Therapeutic activities;Therapeutic exercise;Balance training;Neuromuscular re-education;Patient/family education;Passive range of motion;Manual techniques;Energy conservation    PT Next Visit Plan LE strengthening and stretches      Patient will benefit from skilled therapeutic intervention in order to improve the following deficits and impairments:  Abnormal gait, Decreased activity tolerance, Decreased balance, Decreased cognition, Decreased endurance, Decreased coordination, Decreased mobility, Decreased strength, Decreased range of motion, Difficulty walking, Hypomobility, Increased edema, Increased muscle spasms, Impaired flexibility, Obesity, Pain  Visit Diagnosis: Left hip pain  Difficulty walking  Hamstring tightness of both lower extremities     Problem List Patient Active Problem List   Diagnosis Date Noted  . Primary osteoarthritis of left hip 11/26/2015  . Arthritis of right knee 02/10/2014  . Primary osteoarthritis of right knee 02/09/2014  . Arthritis of knee, left 09/25/2013  . Arthritis of knee 09/25/2013    Paige [name] 01/20/2016, 12:27 PM  [facility] [address] [place], [place], [address] Phone: [phone]   Fax:  [phone]  Name: Paige [name] MRN: [id number] Date of Birth: [birth year]

## 2016-01-22 ENCOUNTER — Ambulatory Visit: Payer: Medicare Other | Admitting: Physical Therapy

## 2016-01-28 ENCOUNTER — Encounter: Payer: Self-pay | Admitting: Physical Therapy

## 2016-01-28 ENCOUNTER — Ambulatory Visit: Payer: Medicare Other | Attending: Orthopedic Surgery | Admitting: Physical Therapy

## 2016-01-28 DIAGNOSIS — M629 Disorder of muscle, unspecified: Secondary | ICD-10-CM | POA: Insufficient documentation

## 2016-01-28 DIAGNOSIS — R262 Difficulty in walking, not elsewhere classified: Secondary | ICD-10-CM | POA: Diagnosis not present

## 2016-01-28 DIAGNOSIS — M25552 Pain in left hip: Secondary | ICD-10-CM | POA: Insufficient documentation

## 2016-01-28 NOTE — Therapy (Signed)
[facility] [address] [place], [place], [address] Phone: [phone]   Fax:  [phone]  Physical Therapy Treatment  Patient Details  Name: Paige [name] MRN: [id number] Date of Birth: 07/03/1945 Referring Provider: Dr. Turner Daniels  Encounter Date: 01/28/2016      PT End of Session - 01/28/16 1422    Visit Number 8   Date for PT Re-Evaluation 02/18/16   PT Start Time 1345   PT Stop Time 1426   PT Time Calculation (min) 41 min   Activity Tolerance Patient tolerated treatment well;No increased pain   Behavior During Therapy WFL for tasks assessed/performed      Past Medical History:  Diagnosis Date  . Anemia   . Arthritis   . Depression   . GERD (gastroesophageal reflux disease)   . H/O hiatal hernia   . Hypertension    not being treated  . Lymphedema of both lower extremities   . PONV (postoperative nausea and vomiting)    after Cholecysectomy  . Restless legs syndrome   . Vertigo     Past Surgical History:  Procedure Laterality Date  . ABDOMINAL HYSTERECTOMY    . CHOLECYSTECTOMY    . COLONOSCOPY    . EYE SURGERY Bilateral    cataracts  . FRACTURE SURGERY Right    arm age 5  . FRACTURE SURGERY Bilateral    both thumbs  . LAPAROSCOPIC GASTRIC BANDING     2010  . TOTAL HIP ARTHROPLASTY Left 11/30/2015   Procedure: TOTAL HIP ARTHROPLASTY ANTERIOR APPROACH;  Surgeon: Gean Birchwood, MD;  [facility];  Service: Orthopedics;  Laterality: Left;  . TOTAL KNEE ARTHROPLASTY Left 09/25/2013   Procedure: TOTAL KNEE ARTHROPLASTY;  Surgeon: Nestor Lewandowsky, MD;  [facility];  Service: Orthopedics;  Laterality: Left;  . TOTAL KNEE ARTHROPLASTY Right 02/10/2014   dr Turner Daniels  . TOTAL KNEE ARTHROPLASTY Right 02/10/2014   Procedure: TOTAL KNEE ARTHROPLASTY;  Surgeon: Nestor Lewandowsky, MD;  [facility];  Service: Orthopedics;  Laterality: Right;    There were no vitals filed for this visit.      Subjective  Assessment - 01/28/16 1352    Subjective "I feel a lot better" "I was able to get up from the floor"    Currently in Pain? No/denies   Pain Score 0-No pain                         [facility] Adult PT Treatment/Exercise - 01/28/16 0001      Knee/Hip Exercises: Aerobic   Nustep 8 min, lvl 5     Knee/Hip Exercises: Machines for Strengthening   Cybex Knee Extension 15 lb, 2x15   Cybex Knee Flexion 35 lb, 2x15     Knee/Hip Exercises: Standing   Hip Abduction Stengthening;Left;2 sets;10 reps;Knee straight   Abduction Limitations 3#   Lateral Step Up Both;1 set;Hand Hold: 1;Step Height: 6";5 reps   Forward Step Up Left;Both;2 sets;10 reps;Hand Hold: 2;Step Height: 6"     Knee/Hip Exercises: Seated   Ball Squeeze 20   Sit to Sand 3 sets;5 reps;without UE support                  PT Short Term Goals - 12/31/15 0946      PT SHORT TERM GOAL #1   Title Independent with HEP   Status Achieved           PT Long  Term Goals - 01/12/16 1016      PT LONG TERM GOAL #1   Status On-going     PT LONG TERM GOAL #2   Status On-going               Plan - 01/28/16 1423    Clinical Impression Statement Pt reports an overall improvement with a better ability to get in and out of her car. Reports some muscle burning with seated leg curls and extensions. Reports some weakness with lateral step ups with LLE.   Rehab Potential Good   PT Frequency 2x / week   PT Duration 8 weeks   PT Treatment/Interventions ADLs/Self Care Home Management;Cryotherapy;Electrical Stimulation;Iontophoresis 4mg /ml Dexamethasone;Moist Heat;Ultrasound;Gait training;Stair training;Functional mobility training;Therapeutic activities;Therapeutic exercise;Balance training;Neuromuscular re-education;Patient/family education;Passive range of motion;Manual techniques;Energy conservation   PT Next Visit Plan LE strengthening and stretches, look to D/C with next 2 visits      Patient will benefit  from skilled therapeutic intervention in order to improve the following deficits and impairments:  Abnormal gait, Decreased activity tolerance, Decreased balance, Decreased cognition, Decreased endurance, Decreased coordination, Decreased mobility, Decreased strength, Decreased range of motion, Difficulty walking, Hypomobility, Increased edema, Increased muscle spasms, Impaired flexibility, Obesity, Pain  Visit Diagnosis: Left hip pain  Difficulty walking  Hamstring tightness of both lower extremities     Problem List Patient Active Problem List   Diagnosis Date Noted  . Primary osteoarthritis of left hip 11/26/2015  . Arthritis of right knee 02/10/2014  . Primary osteoarthritis of right knee 02/09/2014  . Arthritis of knee, left 09/25/2013  . Arthritis of knee 09/25/2013    Paige [name] 01/28/2016, 2:25 PM  [facility] [place] [address] [place], [place], [address] Phone: [phone]   Fax:  [phone]  Name: Paige [name] MRN: [id number] Date of Birth: [birth year]

## 2016-02-02 ENCOUNTER — Encounter: Payer: Self-pay | Admitting: Physical Therapy

## 2016-02-02 ENCOUNTER — Ambulatory Visit: Payer: Medicare Other | Admitting: Physical Therapy

## 2016-02-02 DIAGNOSIS — R262 Difficulty in walking, not elsewhere classified: Secondary | ICD-10-CM

## 2016-02-02 DIAGNOSIS — M25552 Pain in left hip: Secondary | ICD-10-CM | POA: Diagnosis not present

## 2016-02-02 NOTE — Therapy (Signed)
[facility] [address] [place], [place], [address] Phone: [phone]   Fax:  [phone]  Physical Therapy Treatment  Patient Details  Name: Paige [name] MRN: [id number] Date of Birth: 1945-07-11 Referring Provider: Dr. Turner Daniels  Encounter Date: 02/02/2016      PT End of Session - 02/02/16 1401    Visit Number 9   Date for PT Re-Evaluation 02/18/16   PT Start Time 1320   PT Stop Time 1405   PT Time Calculation (min) 45 min      Past Medical History:  Diagnosis Date  . Anemia   . Arthritis   . Depression   . GERD (gastroesophageal reflux disease)   . H/O hiatal hernia   . Hypertension    not being treated  . Lymphedema of both lower extremities   . PONV (postoperative nausea and vomiting)    after Cholecysectomy  . Restless legs syndrome   . Vertigo     Past Surgical History:  Procedure Laterality Date  . ABDOMINAL HYSTERECTOMY    . CHOLECYSTECTOMY    . COLONOSCOPY    . EYE SURGERY Bilateral    cataracts  . FRACTURE SURGERY Right    arm age 71  . FRACTURE SURGERY Bilateral    both thumbs  . LAPAROSCOPIC GASTRIC BANDING     2010  . TOTAL HIP ARTHROPLASTY Left 11/30/2015   Procedure: TOTAL HIP ARTHROPLASTY ANTERIOR APPROACH;  Surgeon: Gean Birchwood, MD;  Location: [facility];  Service: Orthopedics;  Laterality: Left;  . TOTAL KNEE ARTHROPLASTY Left 09/25/2013   Procedure: TOTAL KNEE ARTHROPLASTY;  Surgeon: Nestor Lewandowsky, MD;  Location: [facility];  Service: Orthopedics;  Laterality: Left;  . TOTAL KNEE ARTHROPLASTY Right 02/10/2014   dr Turner Daniels  . TOTAL KNEE ARTHROPLASTY Right 02/10/2014   Procedure: TOTAL KNEE ARTHROPLASTY;  Surgeon: Nestor Lewandowsky, MD;  Location: [facility];  Service: Orthopedics;  Laterality: Right;    There were no vitals filed for this visit.      Subjective Assessment - 02/02/16 1318    Subjective No pain. Little sore from last treatment.    Currently in Pain? No/denies                          [facility] Adult PT Treatment/Exercise - 02/02/16 0001      High Level Balance   High Level Balance Activities Backward walking;Marching forwards;Side stepping  HHA with Red Tband     Knee/Hip Exercises: Aerobic   Nustep 6 min lvl 5      Knee/Hip Exercises: Machines for Strengthening   Cybex Knee Extension 15 lb, 2x15   Cybex Knee Flexion 35 lb, 2x15     Knee/Hip Exercises: Standing   Hip Abduction Stengthening;Both;20 reps   Other Standing Knee Exercises step over and back on airex 10 times HHA x1  wt ball march   Other Standing Knee Exercises ball toss on airex     Knee/Hip Exercises: Seated   Sit to Sand 10 reps;without UE support  wt ball                  PT Short Term Goals - 12/31/15 0946      PT SHORT TERM GOAL #1   Title Independent with HEP   Status Achieved           PT Long Term Goals - 01/12/16 1016      PT LONG TERM  GOAL #1   Status On-going     PT LONG TERM GOAL #2   Status On-going               Plan - 02/02/16 1402    Clinical Impression Statement overall pt is doing much better, balance is fair. progressing with goals   PT Next Visit Plan address goals and possible D/C      Patient will benefit from skilled therapeutic intervention in order to improve the following deficits and impairments:  Abnormal gait, Decreased activity tolerance, Decreased balance, Decreased cognition, Decreased endurance, Decreased coordination, Decreased mobility, Decreased strength, Decreased range of motion, Difficulty walking, Hypomobility, Increased edema, Increased muscle spasms, Impaired flexibility, Obesity, Pain  Visit Diagnosis: Left hip pain  Difficulty walking     Problem List Patient Active Problem List   Diagnosis Date Noted  . Primary osteoarthritis of left hip 11/26/2015  . Arthritis of right knee 02/10/2014  . Primary osteoarthritis of right knee 02/09/2014  . Arthritis of knee, left  09/25/2013  . Arthritis of knee 09/25/2013    Gracianna Vink,ANGIE PTA 02/02/2016, 2:04 PM  [facility] [address] [place], [place], [address] Phone: [phone]   Fax:  [phone]  Name: KAWTHAR ENNEN MRN: [id number] Date of Birth: December 05, 1969

## 2016-02-04 ENCOUNTER — Ambulatory Visit: Payer: Medicare Other | Admitting: Physical Therapy

## 2016-02-04 ENCOUNTER — Encounter: Payer: Self-pay | Admitting: Physical Therapy

## 2016-02-04 DIAGNOSIS — M25552 Pain in left hip: Secondary | ICD-10-CM

## 2016-02-04 DIAGNOSIS — R262 Difficulty in walking, not elsewhere classified: Secondary | ICD-10-CM

## 2016-02-04 NOTE — Therapy (Signed)
[facility] [place], [place], [address] Phone: [phone]   Fax:  [phone]  Physical Therapy Treatment  Patient Details  Name: Paige [name] MRN: [id number] Date of Birth: [birth year] Referring Provider: Dr. Mayer Camel  Encounter Date: 02/04/2016      PT End of Session - 02/04/16 1354    Visit Number 10   Date for PT Re-Evaluation 02/04/16   PT Start Time 0115   PT Stop Time 0200   PT Time Calculation (min) 45 min   Activity Tolerance Patient tolerated treatment well;No increased pain   Behavior During Therapy WFL for tasks assessed/performed      Past Medical History:  Diagnosis Date  . Anemia   . Arthritis   . Depression   . GERD (gastroesophageal reflux disease)   . H/O hiatal hernia   . Hypertension    not being treated  . Lymphedema of both lower extremities   . PONV (postoperative nausea and vomiting)    after Cholecysectomy  . Restless legs syndrome   . Vertigo     Past Surgical History:  Procedure Laterality Date  . ABDOMINAL HYSTERECTOMY    . CHOLECYSTECTOMY    . COLONOSCOPY    . EYE SURGERY Bilateral    cataracts  . FRACTURE SURGERY Right    arm age 51  . FRACTURE SURGERY Bilateral    both thumbs  . LAPAROSCOPIC GASTRIC BANDING     2010  . TOTAL HIP ARTHROPLASTY Left 11/30/2015   Procedure: TOTAL HIP ARTHROPLASTY ANTERIOR APPROACH;  Surgeon: Frederik Pear, MD;  Location: [facility];  Service: Orthopedics;  Laterality: Left;  . TOTAL KNEE ARTHROPLASTY Left 09/25/2013   Procedure: TOTAL KNEE ARTHROPLASTY;  Surgeon: Kerin Salen, MD;  Location: [facility] [facility];  Service: Orthopedics;  Laterality: Left;  . TOTAL KNEE ARTHROPLASTY Right 02/10/2014   dr Mayer Camel  . TOTAL KNEE ARTHROPLASTY Right 02/10/2014   Procedure: TOTAL KNEE ARTHROPLASTY;  Surgeon: Kerin Salen, MD;  Location: [facility];  Service: Orthopedics;  Laterality: Right;    There were no vitals filed for this visit.      Subjective  Assessment - 02/04/16 1319    Subjective Pt reports she is doing well. When asked about discharge, she reports that she is ready to be discharged. She is no longer having any pain however she does report that her knees feel "wobbly".   Currently in Pain? No/denies                         [facility] Adult PT Treatment/Exercise - 02/04/16 0001      Ambulation/Gait   Ambulation/Gait Yes   Gait Comments 6 minute walk test, 850 feet walked.  pt was conversing with therapist during test     Knee/Hip Exercises: Aerobic   Nustep 7 min, lvl 4     Knee/Hip Exercises: Standing   Other Standing Knee Exercises Functional squat lifts 10 pound box   lifting waters out of trunk of car                PT Education - 02/04/16 1353    Education provided Yes   Education Details Proper body mechanics for lifting and removing waters from trunk of car and placing them in house.   Person(s) Educated Patient   Methods Explanation;Demonstration;Verbal cues   Comprehension Verbalized understanding;Returned demonstration          PT Short Term Goals -  12/31/15 0946      PT SHORT TERM GOAL #1   Title Independent with HEP   Status Achieved           PT Long Term Goals - February 16, 2016 1331      PT LONG TERM GOAL #1   Status Partially Met  walked 850'     PT LONG TERM GOAL #2   Status Achieved     PT LONG TERM GOAL #3   Status Achieved               Plan - 2016-02-16 1355    Clinical Impression Statement Pt is being discharged today with possibility of continuing via the monthly plan and will need guidance on how to use the machines in the clinic. Pt has achieved all the goals set for her excpet for 6 minute walk test. She ambulated 850' today and the goal was 1000' however she perfromed the test while carrying on a conversation which may have slowed her down. Pt has progressed well and feels comfortable with home exercises. She is in agreement with the decision to  discharge today.    Rehab Potential Good   PT Next Visit Plan Discharge this visit with goals met and continue with independent gym plan   Consulted and Agree with Plan of Care Patient      Patient will benefit from skilled therapeutic intervention in order to improve the following deficits and impairments:  Abnormal gait, Decreased activity tolerance, Decreased balance, Decreased cognition, Decreased endurance, Decreased coordination, Decreased mobility, Decreased strength, Decreased range of motion, Difficulty walking, Hypomobility, Increased edema, Increased muscle spasms, Impaired flexibility, Obesity, Pain  Visit Diagnosis: Difficulty walking  Left hip pain       G-Codes - 02/16/16 1404    Functional Assessment Tool Used foto 52% limitaiton   Functional Limitation Mobility: Walking and moving around   Mobility: Walking and Moving Around Current Status [phone]) At least 40 percent but less than 60 percent impaired, limited or restricted   Mobility: Walking and Moving Around Goal Status [phone]) At least 40 percent but less than 60 percent impaired, limited or restricted   Mobility: Walking and Moving Around Discharge Status [phone]) At least 40 percent but less than 60 percent impaired, limited or restricted      Problem List Patient Active Problem List   Diagnosis Date Noted  . Primary osteoarthritis of left hip 11/26/2015  . Arthritis of right knee 02/10/2014  . Primary osteoarthritis of right knee 02/09/2014  . Arthritis of knee, left 09/25/2013  . Arthritis of knee 09/25/2013   PHYSICAL THERAPY DISCHARGE SUMMARY   Plan: Patient agrees to discharge.  Patient goals were met. Patient is being discharged due to meeting the stated rehab goals.  ?????      Sumner Boast 02-16-16, 2:07 PM  [facility] [place], [place], [address] Phone: [phone]   Fax:  [phone]  Name: NASIRAH SACHS MRN: [id number] Date of Birth: 1945-03-31

## 2016-02-05 DIAGNOSIS — E559 Vitamin D deficiency, unspecified: Secondary | ICD-10-CM | POA: Diagnosis not present

## 2016-02-08 ENCOUNTER — Ambulatory Visit: Payer: Medicare Other | Admitting: Physical Therapy

## 2016-02-11 ENCOUNTER — Ambulatory Visit: Payer: Medicare Other | Admitting: Physical Therapy

## 2016-02-12 DIAGNOSIS — H01021 Squamous blepharitis right upper eyelid: Secondary | ICD-10-CM | POA: Diagnosis not present

## 2016-02-12 DIAGNOSIS — H01022 Squamous blepharitis right lower eyelid: Secondary | ICD-10-CM | POA: Diagnosis not present

## 2016-02-12 DIAGNOSIS — E559 Vitamin D deficiency, unspecified: Secondary | ICD-10-CM | POA: Diagnosis not present

## 2016-02-12 DIAGNOSIS — H01024 Squamous blepharitis left upper eyelid: Secondary | ICD-10-CM | POA: Diagnosis not present

## 2016-02-12 DIAGNOSIS — Z961 Presence of intraocular lens: Secondary | ICD-10-CM | POA: Diagnosis not present

## 2016-02-12 DIAGNOSIS — H04123 Dry eye syndrome of bilateral lacrimal glands: Secondary | ICD-10-CM | POA: Diagnosis not present

## 2016-02-12 DIAGNOSIS — H01025 Squamous blepharitis left lower eyelid: Secondary | ICD-10-CM | POA: Diagnosis not present

## 2016-03-11 DIAGNOSIS — E538 Deficiency of other specified B group vitamins: Secondary | ICD-10-CM | POA: Diagnosis not present

## 2016-04-14 ENCOUNTER — Other Ambulatory Visit: Payer: Self-pay | Admitting: Obstetrics and Gynecology

## 2016-04-14 DIAGNOSIS — Z1231 Encounter for screening mammogram for malignant neoplasm of breast: Secondary | ICD-10-CM

## 2016-04-27 DIAGNOSIS — F339 Major depressive disorder, recurrent, unspecified: Secondary | ICD-10-CM | POA: Diagnosis not present

## 2016-04-27 DIAGNOSIS — E538 Deficiency of other specified B group vitamins: Secondary | ICD-10-CM | POA: Diagnosis not present

## 2016-04-27 DIAGNOSIS — Z23 Encounter for immunization: Secondary | ICD-10-CM | POA: Diagnosis not present

## 2016-04-27 DIAGNOSIS — Z0000 Encounter for general adult medical examination without abnormal findings: Secondary | ICD-10-CM | POA: Diagnosis not present

## 2016-05-09 ENCOUNTER — Ambulatory Visit
Admission: RE | Admit: 2016-05-09 | Discharge: 2016-05-09 | Disposition: A | Discharge status: Home / Self Care | Payer: Medicare Other | Source: Ambulatory Visit | Attending: Family Medicine | Admitting: Family Medicine

## 2016-05-09 DIAGNOSIS — Z1231 Encounter for screening mammogram for malignant neoplasm of breast: Secondary | ICD-10-CM | POA: Diagnosis not present

## 2016-05-19 DIAGNOSIS — M7062 Trochanteric bursitis, left hip: Secondary | ICD-10-CM | POA: Diagnosis not present

## 2016-05-19 DIAGNOSIS — M545 Low back pain: Secondary | ICD-10-CM | POA: Diagnosis not present

## 2016-05-19 DIAGNOSIS — M25511 Pain in right shoulder: Secondary | ICD-10-CM | POA: Diagnosis not present

## 2016-09-23 DIAGNOSIS — M546 Pain in thoracic spine: Secondary | ICD-10-CM | POA: Diagnosis not present

## 2016-09-23 DIAGNOSIS — M545 Low back pain: Secondary | ICD-10-CM | POA: Diagnosis not present

## 2016-10-24 ENCOUNTER — Encounter (HOSPITAL_COMMUNITY): Payer: Self-pay

## 2017-07-10 DIAGNOSIS — M791 Myalgia, unspecified site: Secondary | ICD-10-CM | POA: Diagnosis not present

## 2017-08-31 DIAGNOSIS — Z0000 Encounter for general adult medical examination without abnormal findings: Secondary | ICD-10-CM | POA: Diagnosis not present

## 2017-08-31 DIAGNOSIS — G479 Sleep disorder, unspecified: Secondary | ICD-10-CM | POA: Diagnosis not present

## 2017-08-31 DIAGNOSIS — K219 Gastro-esophageal reflux disease without esophagitis: Secondary | ICD-10-CM | POA: Diagnosis not present

## 2017-09-26 DIAGNOSIS — E538 Deficiency of other specified B group vitamins: Secondary | ICD-10-CM | POA: Diagnosis not present

## 2017-10-17 ENCOUNTER — Encounter (HOSPITAL_COMMUNITY): Payer: Self-pay

## 2017-10-23 DIAGNOSIS — R42 Dizziness and giddiness: Secondary | ICD-10-CM | POA: Diagnosis not present

## 2017-10-23 DIAGNOSIS — F339 Major depressive disorder, recurrent, unspecified: Secondary | ICD-10-CM | POA: Diagnosis not present

## 2017-10-23 DIAGNOSIS — Z23 Encounter for immunization: Secondary | ICD-10-CM | POA: Diagnosis not present

## 2018-01-10 DIAGNOSIS — T485X5A Adverse effect of other anti-common-cold drugs, initial encounter: Secondary | ICD-10-CM | POA: Diagnosis not present

## 2018-01-10 DIAGNOSIS — J300 Vasomotor rhinitis: Secondary | ICD-10-CM

## 2018-01-10 DIAGNOSIS — J310 Chronic rhinitis: Secondary | ICD-10-CM

## 2018-01-10 DIAGNOSIS — J3089 Other allergic rhinitis: Secondary | ICD-10-CM

## 2018-01-10 HISTORY — DX: Adverse effect of other anti-common-cold drugs, initial encounter: T48.5X5A

## 2018-01-10 HISTORY — DX: Other allergic rhinitis: J30.89

## 2018-01-10 HISTORY — DX: Chronic rhinitis: J31.0

## 2018-01-10 HISTORY — DX: Vasomotor rhinitis: J30.0

## 2018-01-15 DIAGNOSIS — H0230 Blepharochalasis unspecified eye, unspecified eyelid: Secondary | ICD-10-CM | POA: Diagnosis not present

## 2018-02-08 DIAGNOSIS — H524 Presbyopia: Secondary | ICD-10-CM | POA: Diagnosis not present

## 2018-02-08 DIAGNOSIS — Z961 Presence of intraocular lens: Secondary | ICD-10-CM | POA: Diagnosis not present

## 2018-02-08 DIAGNOSIS — H02834 Dermatochalasis of left upper eyelid: Secondary | ICD-10-CM | POA: Diagnosis not present

## 2018-02-08 DIAGNOSIS — H02831 Dermatochalasis of right upper eyelid: Secondary | ICD-10-CM | POA: Diagnosis not present

## 2018-02-08 DIAGNOSIS — H04123 Dry eye syndrome of bilateral lacrimal glands: Secondary | ICD-10-CM | POA: Diagnosis not present

## 2018-02-15 ENCOUNTER — Encounter (INDEPENDENT_AMBULATORY_CARE_PROVIDER_SITE_OTHER): Payer: Self-pay

## 2018-02-28 ENCOUNTER — Encounter (INDEPENDENT_AMBULATORY_CARE_PROVIDER_SITE_OTHER): Payer: Self-pay | Admitting: Family Medicine

## 2018-02-28 ENCOUNTER — Ambulatory Visit (INDEPENDENT_AMBULATORY_CARE_PROVIDER_SITE_OTHER): Payer: Medicare Other | Admitting: Family Medicine

## 2018-02-28 VITALS — BP 127/83 | HR 86 | Temp 97.7°F | Ht 67.0 in | Wt 306.0 lb

## 2018-02-28 DIAGNOSIS — R0602 Shortness of breath: Secondary | ICD-10-CM | POA: Diagnosis not present

## 2018-02-28 DIAGNOSIS — I10 Essential (primary) hypertension: Secondary | ICD-10-CM | POA: Diagnosis not present

## 2018-02-28 DIAGNOSIS — Z6841 Body Mass Index (BMI) 40.0 and over, adult: Secondary | ICD-10-CM

## 2018-02-28 DIAGNOSIS — F3289 Other specified depressive episodes: Secondary | ICD-10-CM | POA: Diagnosis not present

## 2018-02-28 DIAGNOSIS — E66813 Obesity, class 3: Secondary | ICD-10-CM

## 2018-02-28 DIAGNOSIS — E559 Vitamin D deficiency, unspecified: Secondary | ICD-10-CM | POA: Diagnosis not present

## 2018-02-28 DIAGNOSIS — E538 Deficiency of other specified B group vitamins: Secondary | ICD-10-CM

## 2018-02-28 DIAGNOSIS — Z0289 Encounter for other administrative examinations: Secondary | ICD-10-CM

## 2018-02-28 DIAGNOSIS — R5383 Other fatigue: Secondary | ICD-10-CM

## 2018-03-01 LAB — COMPREHENSIVE METABOLIC PANEL
ALT: 17 IU/L (ref 0–32)
AST: 17 IU/L (ref 0–40)
Albumin/Globulin Ratio: 2.4 — ABNORMAL HIGH (ref 1.2–2.2)
Albumin: 4.5 g/dL (ref 3.7–4.7)
Alkaline Phosphatase: 119 IU/L — ABNORMAL HIGH (ref 39–117)
BUN/Creatinine Ratio: 14 (ref 12–28)
BUN: 15 mg/dL (ref 8–27)
Bilirubin Total: 0.6 mg/dL (ref 0.0–1.2)
CO2: 24 mmol/L (ref 20–29)
CREATININE: 1.06 mg/dL — AB (ref 0.57–1.00)
Calcium: 9.5 mg/dL (ref 8.7–10.3)
Chloride: 97 mmol/L (ref 96–106)
GFR, EST AFRICAN AMERICAN: 61 mL/min/{1.73_m2} (ref >59)
GFR, EST NON AFRICAN AMERICAN: 53 mL/min/{1.73_m2} — AB (ref >59)
GLUCOSE: 96 mg/dL (ref 65–99)
Globulin, Total: 1.9 g/dL (ref 1.5–4.5)
Potassium: 4.5 mmol/L (ref 3.5–5.2)
Sodium: 138 mmol/L (ref 134–144)
TOTAL PROTEIN: 6.4 g/dL (ref 6.0–8.5)

## 2018-03-01 LAB — VITAMIN B12: VITAMIN B 12: 237 pg/mL (ref 232–1245)

## 2018-03-01 LAB — VITAMIN D 25 HYDROXY (VIT D DEFICIENCY, FRACTURES): Vit D, 25-Hydroxy: 14.3 ng/mL — ABNORMAL LOW (ref 30.0–100.0)

## 2018-03-01 LAB — CBC WITH DIFFERENTIAL
BASOS ABS: 0 10*3/uL (ref 0.0–0.2)
Basos: 1 %
EOS (ABSOLUTE): 0.1 10*3/uL (ref 0.0–0.4)
Eos: 2 %
HEMOGLOBIN: 15.9 g/dL (ref 11.1–15.9)
Hematocrit: 47.5 % — ABNORMAL HIGH (ref 34.0–46.6)
IMMATURE GRANULOCYTES: 0 %
Immature Grans (Abs): 0 10*3/uL (ref 0.0–0.1)
LYMPHS ABS: 0.9 10*3/uL (ref 0.7–3.1)
Lymphs: 14 %
MCH: 31 pg (ref 26.6–33.0)
MCHC: 33.5 g/dL (ref 31.5–35.7)
MCV: 93 fL (ref 79–97)
MONOCYTES: 7 %
Monocytes Absolute: 0.5 10*3/uL (ref 0.1–0.9)
Neutrophils Absolute: 4.8 10*3/uL (ref 1.4–7.0)
Neutrophils: 76 %
RBC: 5.13 x10E6/uL (ref 3.77–5.28)
RDW: 13 % (ref 11.7–15.4)
WBC: 6.4 10*3/uL (ref 3.4–10.8)

## 2018-03-01 LAB — T3: T3, Total: 143 ng/dL (ref 71–180)

## 2018-03-01 LAB — T4, FREE: Free T4: 1.4 ng/dL (ref 0.82–1.77)

## 2018-03-01 LAB — LIPID PANEL WITH LDL/HDL RATIO
CHOLESTEROL TOTAL: 177 mg/dL (ref 100–199)
HDL: 56 mg/dL (ref >39)
LDL Calculated: 103 mg/dL — ABNORMAL HIGH (ref 0–99)
LDl/HDL Ratio: 1.8 ratio (ref 0.0–3.2)
TRIGLYCERIDES: 92 mg/dL (ref 0–149)
VLDL CHOLESTEROL CAL: 18 mg/dL (ref 5–40)

## 2018-03-01 LAB — INSULIN, RANDOM: INSULIN: 11.2 u[IU]/mL (ref 2.6–24.9)

## 2018-03-01 LAB — HEMOGLOBIN A1C
ESTIMATED AVERAGE GLUCOSE: 94 mg/dL
Hgb A1c MFr Bld: 4.9 % (ref 4.8–5.6)

## 2018-03-01 LAB — TSH: TSH: 1.3 u[IU]/mL (ref 0.450–4.500)

## 2018-03-01 NOTE — Progress Notes (Signed)
.  Office: [phone]  /  Fax: [phone]   HPI:   Chief Complaint: OBESITY  Paige [name] [name] (MR# [id number]) is a 73 y.o. female who presents on [name] for obesity evaluation and treatment. Current BMI is Body mass index is 47.93 kg/m.   Paige [name] [name] has struggled with obesity for years and has been unsuccessful in either losing weight or maintaining long term weight loss. Paige [name] [name] attended our information session and states she is currently in the action stage of change and ready to dedicate time achieving and maintaining a healthier weight. Paige [name] [name] is status post lap band weight loss surgery in 2012. Her weight went from 330 lbs to 270 in 6 months. She started regaining after 1 year and is now at 306 pounds. She notes mild restriction with eating bread rapidly.  Paige [name] [name] states her desired weight loss is 146 lbs she has been heavy most of her life she started gaining weight since 1975 her heaviest weight ever was 335 lbs. she snacks frequently in the evenings she wakes up frequently in the middle of the night to eat she frequently makes poor food choices she has binge eating behaviors she struggles with emotional eating    Paige [name] [name] feels her energy is lower than it should be. This has worsened with weight gain and has not worsened recently. Paige [name] [name] admits to daytime somnolence and admits to waking up still tired. Patient is at risk for obstructive sleep apnea. Paige [name] [name] has a history of symptoms of daytime Paige [name], morning headache and hypertension. Patient generally gets 6 hours of sleep per night, and states they generally have restless sleep. Snoring is present. Apneic episodes are not present. Epworth Sleepiness Score is 9.  Dyspnea on exertion Paige [name] [name] notes increasing shortness of breath with exercising and seems to be worsening over time with weight gain. She notes getting out of breath sooner with activity than she used to. This has not gotten worse recently. Paige [name] [name] denies  orthopnea.  Hypertension Paige [name] [name] is a 73 y.o. female with hypertension. Paige [name] [name]'s blood pressure is currently controlled. She is working on weight loss to help control her blood pressure with the goal of decreasing her risk of heart attack and stroke.   Vitamin B12 Paige [name] [name] has a diagnosis of B12 insufficiency and notes Paige [name]. This is not a new diagnosis and she is not on B12 supplementation. She is not a vegetarian and does have a previous diagnosis of pernicious anemia. She has a history of weight loss surgery.   Vitamin D deficiency Paige [name] [name] has a diagnosis of vitamin D deficiency. She is not currently taking vit D and is status post weight loss surgery. She admits Paige [name].  Depression with emotional eating behaviors Paige [name] [name] is on multiple medications and notes significant emotional eating. She sometimes feels her weight makes her life not worth loving, but she shows no sign of suicidal or homicidal ideations. She is struggling with emotional eating and using food for comfort to the extent that it is negatively impacting her health. She often snacks when she is not hungry. Paige [name] [name] sometimes feels she is out of control and then feels guilty that she made poor food choices. She has been working on behavior modification techniques to help reduce her emotional eating and has been somewhat successful.   Depression Screen Paige [name] [name]'s Food and Mood (modified PHQ-9) score was 17. Depression screen Paige [name] [name]  Decreased Interest 3  Down, Depressed, Hopeless 3  PHQ - 2 Score 6  Altered sleeping 3  Tired, decreased energy  3  Change in appetite 3  Feeling bad or failure about yourself  0  Trouble concentrating 0  Moving slowly or fidgety/restless 1  Suicidal thoughts 1  PHQ-9 Score 17  Difficult doing work/chores Somewhat difficult   ASSESSMENT AND PLAN:  Other Paige [name] - Plan: EKG 12-Lead, CBC With Differential, Comprehensive metabolic panel, Hemoglobin A1c, Insulin, random, T3, T4, free,  TSH  Shortness of breath on exertion - Plan: Lipid Panel With LDL/HDL Ratio  Essential hypertension  B12 nutritional deficiency - Plan: Vitamin B12  Vitamin D deficiency - Plan: VITAMIN D 25 Hydroxy (Vit-D Deficiency, Fractures)  Other depression - with emotional eating  Class 3 severe obesity with serious comorbidity and body mass index (BMI) of 45.0 to 49.9 in adult, unspecified obesity type (HCC)  PLAN:  Paige [name] Paige [name] [name] was informed that her Paige [name] may be related to obesity, depression or many other causes. Labs will be ordered, and in the meanwhile Paige [name] [name] has agreed to work on diet, exercise and weight loss to help with Paige [name]. Proper sleep hygiene was discussed including the need for 7-8 hours of quality sleep each night. A sleep study was not ordered based on symptoms and Epworth score. An EKG and an indirect calorimetry was ordered today.  Dyspnea on exertion Paige [name] [name]'s shortness of breath appears to be obesity related and exercise induced. She has agreed to work on weight loss and gradually increase exercise to treat her exercise induced shortness of breath. If Paige [name] [name] follows our instructions and loses weight without improvement of her shortness of breath, we will plan to refer to pulmonology. We will monitor this condition regularly. We ordered an indirect calorimetry, an EKG, and labs today. Paige [name] [name] agrees follow up in 2 weeks.  Hypertension We discussed sodium restriction, working on healthy weight loss, and a regular exercise program as the means to achieve improved blood pressure control. We will continue to monitor her blood pressure as well as her progress with the above lifestyle modifications. She will continue her medications as prescribed and will watch for signs of hypotension as she continues her lifestyle modifications. Paige [name] [name] agreed with this plan and agreed to follow up as directed.  Vitamin B12 Paige [name] [name] will work on increasing B12 rich foods in her diet. B12 supplementation  was not prescribed today. We will plan on checking labs today and she agrees to follow up as directed.  Vitamin D Deficiency Paige [name] [name] was informed that low vitamin D levels contributes to Paige [name] and are associated with obesity, breast, and colon cancer. Labs were ordered today and she agrees to follow up in 2 weeks.  Depression with Emotional Eating Behaviors We discussed behavior modification techniques today to help Paige [name] [name] deal with her emotional eating and depression. She has agreed to continue her medications and agreed to follow up as directed. Patient was referred to Dr. Dewaine CongerBarker, our bariatric psychologist for evaluation in 2 weeks due to elevated PHQ-9 score and significant struggles with emotional eating.  Depression Screen Aracelie had a strongly positive depression screening. Depression is commonly associated with obesity and often results in emotional eating behaviors. We will monitor this closely and work on CBT to help improve the non-hunger eating patterns. Referral to Psychology may be required if no improvement is seen as she continues in our clinic.  Obesity Rebekha is currently in the action stage of change and her goal is to continue with weight loss efforts. She has agreed to follow the Category 3 plan. Kaylise has been instructed to work up to a goal of  150 minutes of combined cardio and strengthening exercise per week for weight loss and overall health benefits. We discussed the following Behavioral Modification Strategies today: increasing lean protein intake, decreasing simple carbohydrates, and no skipping meals.   Nia has agreed to follow up with our clinic in 2 weeks. She was informed of the importance of frequent follow up visits to maximize her success with intensive lifestyle modifications for her multiple health conditions. She was informed we would discuss her lab results at her next visit unless there is a critical issue that needs to be addressed sooner. Tonette agreed to  keep her next visit at the agreed upon time to discuss these results.  ALLERGIES: Allergies  Allergen Reactions  . No Known Allergies     MEDICATIONS: Current Outpatient Medications on File Prior to Visit  Medication Sig Dispense Refill  . buPROPion (WELLBUTRIN XL) 150 MG 24 hr tablet Take 150 mg by mouth daily.  4  . PARoxetine (PAXIL) 40 MG tablet Take 40 mg by mouth every morning.      . pramipexole (MIRAPEX) 0.25 MG tablet Take 0.25 mg by mouth 2 (two) times daily.     . traZODone (DESYREL) 150 MG tablet Take 150 mg by mouth at bedtime.  10   No current facility-administered medications on file prior to visit.     PAST MEDICAL HISTORY: Past Medical History:  Diagnosis Date  . Anemia   . Anxiety   . Arthritis   . Back pain   . Depression   . Paige [name]   . Gallbladder problem   . GERD (gastroesophageal reflux disease)   . H/O hiatal hernia   . Hypertension    not being treated  . Joint pain   . Lymphedema of both lower extremities   . Obesity   . Osteoarthritis   . PONV (postoperative nausea and vomiting)    after Cholecysectomy  . Restless legs syndrome   . Vertigo   . Vitamin B 12 deficiency     PAST SURGICAL HISTORY: Past Surgical History:  Procedure Laterality Date  . ABDOMINAL HYSTERECTOMY    . CHOLECYSTECTOMY    . COLONOSCOPY    . EYE SURGERY Bilateral    cataracts  . FRACTURE SURGERY Right    arm age 33  . FRACTURE SURGERY Bilateral    both thumbs  . LAPAROSCOPIC GASTRIC BANDING     2010  . OVARIAN CYST REMOVAL    . TOTAL HIP ARTHROPLASTY Left 11/30/2015   Procedure: TOTAL HIP ARTHROPLASTY ANTERIOR APPROACH;  Surgeon: Gean Birchwood, MD;  Location: [facility] OR;  Service: Orthopedics;  Laterality: Left;  . TOTAL KNEE ARTHROPLASTY Left 09/25/2013   Procedure: TOTAL KNEE ARTHROPLASTY;  Surgeon: Nestor Lewandowsky, MD;  Location: [facility] OR;  Service: Orthopedics;  Laterality: Left;  . TOTAL KNEE ARTHROPLASTY Right 02/10/2014   dr Turner Daniels  . TOTAL KNEE ARTHROPLASTY Right  02/10/2014   Procedure: TOTAL KNEE ARTHROPLASTY;  Surgeon: Nestor Lewandowsky, MD;  Location: [facility] OR;  Service: Orthopedics;  Laterality: Right;    SOCIAL HISTORY: Social History   Tobacco Use  . Smoking status: Never Smoker  . Smokeless tobacco: Never Used  Substance Use Topics  . Alcohol use: Yes    Comment: rarely  . Drug use: No    FAMILY HISTORY: Family History  Problem Relation Age of Onset  . Hypertension Mother   . Depression Mother     ROS: Review of Systems  Constitutional: Positive for malaise/Paige [name]. Negative for weight loss.  HENT: Positive for hearing loss.        Positive for nasal discharge.  Eyes:       Wears glasses or contacts. Positive for floaters. Positive for flashes of light.  Respiratory: Shortness of breath:  with activity.   Cardiovascular: Negative for orthopnea.  Neurological: Positive for weakness and headaches.  Psychiatric/Behavioral: Positive for depression. Negative for suicidal ideas. The patient has insomnia.        Negative for homicidal ideations.    PHYSICAL EXAM: Blood pressure 127/83, pulse 86, temperature 97.7 F (36.5 C), temperature source Oral, height 5\' 7"  (1.702 m), weight (!) 306 lb (138.8 kg), SpO2 95 %. Body mass index is 47.93 kg/m. Physical Exam Vitals signs reviewed.  Constitutional:      Appearance: Normal appearance. She is obese.  HENT:     Head: Normocephalic and atraumatic.     Nose: Nose normal.  Eyes:     General: No scleral icterus.    Extraocular Movements: Extraocular movements intact.  Neck:     Musculoskeletal: Normal range of motion and neck supple.     Thyroid: No thyromegaly.     Comments: Negative for thyromegaly. Cardiovascular:     Rate and Rhythm: Normal rate and regular rhythm.  Pulmonary:     Effort: Pulmonary effort is normal. No respiratory distress.  Abdominal:     Palpations: Abdomen is soft.     Tenderness: There is no abdominal tenderness.     Comments: Positive for obesity.   Musculoskeletal:     Comments: ROM normal in all extremities.  Skin:    General: Skin is warm and dry.  Neurological:     Mental Status: She is alert and oriented to person, place, and time.     Coordination: Coordination normal.  Psychiatric:        Mood and Affect: Mood normal.        Behavior: Behavior normal.     RECENT LABS AND TESTS: BMET    Component Value Date/Time   NA 137 12/01/2015 0702   K 5.2 (H) 12/01/2015 0702   CL 102 12/01/2015 0702   CO2 23 12/01/2015 0702   GLUCOSE 104 (H) 12/01/2015 0702   BUN 14 12/01/2015 0702   CREATININE 0.77 12/01/2015 0702   CALCIUM 9.0 12/01/2015 0702   GFRNONAA >60 12/01/2015 0702   GFRAA >60 12/01/2015 0702   No results found for: HGBA1C No results found for: INSULIN CBC    Component Value Date/Time   WBC 13.6 (H) 12/02/2015 0659   RBC 3.64 (L) 12/02/2015 0659   HGB 11.7 (L) 12/02/2015 0659   HCT 35.0 (L) 12/02/2015 0659   PLT 188 12/02/2015 0659   MCV 96.2 12/02/2015 0659   MCH 32.1 12/02/2015 0659   MCHC 33.4 12/02/2015 0659   RDW 13.4 12/02/2015 0659   LYMPHSABS 1.0 11/19/2015 0957   MONOABS 0.4 11/19/2015 0957   EOSABS 0.1 11/19/2015 0957   BASOSABS 0.1 11/19/2015 0957   Iron/TIBC/Ferritin/ %Sat No results found for: IRON, TIBC, FERRITIN, IRONPCTSAT Lipid Panel  No results found for: CHOL, TRIG, HDL, CHOLHDL, VLDL, LDLCALC, LDLDIRECT Hepatic Function Panel     Component Value Date/Time   PROT 6.6 03/20/2009 0845   ALBUMIN 3.5 03/20/2009 0845   AST 21 03/20/2009 0845   ALT 24 03/20/2009 0845   ALKPHOS 101 03/20/2009 0845   BILITOT 0.5 03/20/2009 0845   No results found for: TSH  ECG  shows NSR with a rate of 91 BPM. INDIRECT CALORIMETER done  today shows a VO2 of 317 and a REE of 2207. Her calculated basal metabolic rate is 4098 thus her basal metabolic rate is better than expected.  OBESITY BEHAVIORAL INTERVENTION VISIT  Today's visit was # 1   Starting weight: 306 lbs Starting date:  [name] Today's weight : Weight: (!) 306 lb (138.8 kg)  Today's date: [name] Total lbs lost to date: 0 At least 15 minutes were spent on discussing the following behavioral intervention visit.  ASK: We discussed the diagnosis of obesity with Cathaleen E Blankley today and Fredricka agreed to give [place] permission to discuss obesity behavioral modification therapy today.  ASSESS: Kalijah has the diagnosis of obesity and her BMI today is 47.9. Gema is in the action stage of change.   ADVISE: Jaunice was educated on the multiple health risks of obesity as well as the benefit of weight loss to improve her health. She was advised of the need for long term treatment and the importance of lifestyle modifications to improve her current health and to decrease her risk of future health problems.  AGREE: Multiple dietary modification options and treatment options were discussed and Laela agreed to follow the recommendations documented in the above note.  ARRANGE: Kween was educated on the importance of frequent visits to treat obesity as outlined per CMS and USPSTF guidelines and agreed to schedule her next follow up appointment today.   IKirke Corin, CMA, am acting as transcriptionist for Wilder Glade, MD   I have reviewed the above documentation for accuracy and completeness, and I agree with the above. -Quillian Quince, MD

## 2018-03-14 ENCOUNTER — Ambulatory Visit (INDEPENDENT_AMBULATORY_CARE_PROVIDER_SITE_OTHER): Payer: Medicare Other | Admitting: Family Medicine

## 2018-03-14 ENCOUNTER — Encounter (INDEPENDENT_AMBULATORY_CARE_PROVIDER_SITE_OTHER): Payer: Self-pay | Admitting: Family Medicine

## 2018-03-14 VITALS — BP 141/85 | HR 82 | Temp 98.6°F | Ht 67.0 in | Wt 303.0 lb

## 2018-03-14 DIAGNOSIS — N39498 Other specified urinary incontinence: Secondary | ICD-10-CM

## 2018-03-14 DIAGNOSIS — E559 Vitamin D deficiency, unspecified: Secondary | ICD-10-CM

## 2018-03-14 DIAGNOSIS — Z6841 Body Mass Index (BMI) 40.0 and over, adult: Secondary | ICD-10-CM

## 2018-03-14 DIAGNOSIS — E8881 Metabolic syndrome: Secondary | ICD-10-CM

## 2018-03-14 MED ORDER — VITAMIN D (ERGOCALCIFEROL) 1.25 MG (50000 UNIT) PO CAPS: 50000.0000 [IU] | ORAL_CAPSULE | ORAL | 0 refills | Status: DC

## 2018-03-15 ENCOUNTER — Ambulatory Visit (INDEPENDENT_AMBULATORY_CARE_PROVIDER_SITE_OTHER): Payer: Medicare Other | Admitting: Psychology

## 2018-03-15 DIAGNOSIS — F3289 Other specified depressive episodes: Secondary | ICD-10-CM

## 2018-03-15 NOTE — Progress Notes (Signed)
Office: [phone]  /  Fax: [phone]   HPI:   Chief Complaint: OBESITY Paige [name] is here to discuss her progress with her obesity treatment plan. Paige is on the Category 3 plan and is following her eating plan approximately 80% of the time. Paige states Paige is exercising 0 minutes 0 times per week. Paige [name] did very well with weight loss on the Category 3 plan but struggled to eat all of her food due to the Lap Band restriction. Hunger was controlled status post Lap Band surgery. Her weight is (!) 303 lb (137.4 kg) today and has had a weight loss of 3 pounds over a period of 2 weeks since her last visit. Paige has lost 3 lbs since starting treatment with [place].  Vitamin D deficiency Paige [name] has a diagnosis of Vitamin D deficiency. Paige is currently not taking Vit D and and does report fatigue.  Insulin Resistance Paige [name] has a new diagnosis. Paige has a normal A1C and glucose but has an increased fasting insulin. Paige does report a decrease in her polyphagia in the last 2 weeks on her Category 3 plan. Paige denies hypoglycemia.   Urinary Incontinence Paige [name] notes increased incontinence with coughing and lifting heavy objects. Paige saw a urologist years ago who started her on a medication but it didn't help so Paige discontinued it.  ASSESSMENT AND PLAN:  Vitamin D deficiency - Plan: Vitamin D, Ergocalciferol, (DRISDOL) 1.25 MG (50000 UT) CAPS capsule  Insulin resistance  Other urinary incontinence  Class 3 severe obesity with serious comorbidity and body mass index (BMI) of 45.0 to 49.9 in adult, unspecified obesity type (HCC)  PLAN:  Vitamin D Deficiency Paige [name] was informed that low Vitamin D levels contributes to fatigue and are associated with obesity, breast, and colon cancer. Paige agrees to start taking prescription Vit D @ 50,000 IU every week #4 with no refills and will follow-up for routine testing of Vitamin D in 3 months. Paige was informed of the risk of over-replacement of Vitamin D and agrees  to not increase her dose unless Paige discusses this with [place] first. Paige [name] agrees to follow-up with our clinic in 2 weeks.  Insulin Resistance Paige [name] will continue to work on weight loss, exercise, and decreasing simple carbohydrates in her diet to help decrease the risk of diabetes. We dicussed metformin including benefits and risks. Paige was informed that eating too many simple carbohydrates or too many calories at one sitting increases the likelihood of GI side effects. Paige will continue her diet and exercise. Paige [name] agreed to follow-up with [place] as directed to monitor her progress.  Urinary Incontinence Paige [name] refused to go back to her urologist to look at other options to help.  Obesity Paige [name] is currently in the action stage of change. As such, her goal is to continue with weight loss efforts. Paige has agreed to follow the Category 3 plan. Paige [name] has been instructed to work up to a goal of 150 minutes of combined cardio and strengthening exercise per week for weight loss and overall health benefits. We discussed the following Behavioral Modification Strategies today: increasing lean protein intake, decreasing simple carbohydrates, increase H20 intake, and work on meal planning and easy cooking plans  Leaner has agreed to follow-up with our clinic in 2 weeks. Paige was informed of the importance of frequent follow up visits to maximize her success with intensive lifestyle modifications for her multiple health conditions.  ALLERGIES: Allergies  Allergen Reactions  . No Known Allergies  MEDICATIONS: Current Outpatient Medications on File Prior to Visit  Medication Sig Dispense Refill  . buPROPion (WELLBUTRIN XL) 150 MG 24 hr tablet Take 150 mg by mouth daily.  4  . PARoxetine (PAXIL) 40 MG tablet Take 40 mg by mouth every morning.      . pramipexole (MIRAPEX) 0.25 MG tablet Take 0.25 mg by mouth 2 (two) times daily.     . traZODone (DESYREL) 150 MG tablet Take 150 mg by mouth at bedtime.  10    No current facility-administered medications on file prior to visit.     PAST MEDICAL HISTORY: Past Medical History:  Diagnosis Date  . Anemia   . Anxiety   . Arthritis   . Back pain   . Depression   . Fatigue   . Gallbladder problem   . GERD (gastroesophageal reflux disease)   . H/O hiatal hernia   . Hypertension    not being treated  . Joint pain   . Lymphedema of both lower extremities   . Obesity   . Osteoarthritis   . PONV (postoperative nausea and vomiting)    after Cholecysectomy  . Restless legs syndrome   . Vertigo   . Vitamin B 12 deficiency     PAST SURGICAL HISTORY: Past Surgical History:  Procedure Laterality Date  . ABDOMINAL HYSTERECTOMY    . CHOLECYSTECTOMY    . COLONOSCOPY    . EYE SURGERY Bilateral    cataracts  . FRACTURE SURGERY Right    arm age 23  . FRACTURE SURGERY Bilateral    both thumbs  . LAPAROSCOPIC GASTRIC BANDING     2010  . OVARIAN CYST REMOVAL    . TOTAL HIP ARTHROPLASTY Left 11/30/2015   Procedure: TOTAL HIP ARTHROPLASTY ANTERIOR APPROACH;  Surgeon: Gean Birchwood, MD;  Location: [facility];  Service: Orthopedics;  Laterality: Left;  . TOTAL KNEE ARTHROPLASTY Left 09/25/2013   Procedure: TOTAL KNEE ARTHROPLASTY;  Surgeon: Nestor Lewandowsky, MD;  Location: [facility];  Service: Orthopedics;  Laterality: Left;  . TOTAL KNEE ARTHROPLASTY Right 02/10/2014   dr Turner Daniels  . TOTAL KNEE ARTHROPLASTY Right 02/10/2014   Procedure: TOTAL KNEE ARTHROPLASTY;  Surgeon: Nestor Lewandowsky, MD;  Location: [facility];  Service: Orthopedics;  Laterality: Right;    SOCIAL HISTORY: Social History   Tobacco Use  . Smoking status: Never Smoker  . Smokeless tobacco: Never Used  Substance Use Topics  . Alcohol use: Yes    Comment: rarely  . Drug use: No    FAMILY HISTORY: Family History  Problem Relation Age of Onset  . Hypertension Mother   . Depression Mother    ROS: Review of Systems  Constitutional: Positive for malaise/fatigue and weight loss.  Genitourinary:        Positive for urinary incontinence.  Endo/Heme/Allergies:       Positive for decreased polyphagia. Negative for hypoglycemia.   PHYSICAL EXAM: Blood pressure (!) 141/85, pulse 82, temperature 98.6 F (37 C), temperature source Oral, height 5\' 7"  (1.702 m), weight (!) 303 lb (137.4 kg), SpO2 96 %. Body mass index is 47.46 kg/m. Physical Exam Vitals signs reviewed.  Constitutional:      Appearance: Normal appearance. Paige is obese.  Cardiovascular:     Rate and Rhythm: Normal rate.     Pulses: Normal pulses.  Pulmonary:     Effort: Pulmonary effort is normal.     Breath sounds: Normal breath sounds.  Musculoskeletal: Normal range of motion.  Skin:    General:  Skin is warm and dry.  Neurological:     Mental Status: Paige is alert and oriented to person, place, and time.  Psychiatric:        Behavior: Behavior normal.   RECENT LABS AND TESTS: BMET    Component Value Date/Time   NA 138 02/28/2018 1128   K 4.5 02/28/2018 1128   CL 97 02/28/2018 1128   CO2 24 02/28/2018 1128   GLUCOSE 96 02/28/2018 1128   GLUCOSE 104 (H) 12/01/2015 0702   BUN 15 02/28/2018 1128   CREATININE 1.06 (H) 02/28/2018 1128   CALCIUM 9.5 02/28/2018 1128   GFRNONAA 53 (L) 02/28/2018 1128   GFRAA 61 02/28/2018 1128   Lab Results  Component Value Date   HGBA1C 4.9 02/28/2018   Lab Results  Component Value Date   INSULIN 11.2 02/28/2018   CBC    Component Value Date/Time   WBC 6.4 02/28/2018 1128   WBC 13.6 (H) 12/02/2015 0659   RBC 5.13 02/28/2018 1128   RBC 3.64 (L) 12/02/2015 0659   HGB 15.9 02/28/2018 1128   HCT 47.5 (H) 02/28/2018 1128   PLT 188 12/02/2015 0659   MCV 93 02/28/2018 1128   MCH 31.0 02/28/2018 1128   MCH 32.1 12/02/2015 0659   MCHC 33.5 02/28/2018 1128   MCHC 33.4 12/02/2015 0659   RDW 13.0 02/28/2018 1128   LYMPHSABS 0.9 02/28/2018 1128   MONOABS 0.4 11/19/2015 0957   EOSABS 0.1 02/28/2018 1128   BASOSABS 0.0 02/28/2018 1128   Iron/TIBC/Ferritin/ %Sat No  results found for: IRON, TIBC, FERRITIN, IRONPCTSAT Lipid Panel     Component Value Date/Time   CHOL 177 02/28/2018 1128   TRIG 92 02/28/2018 1128   HDL 56 02/28/2018 1128   LDLCALC 103 (H) 02/28/2018 1128   Hepatic Function Panel     Component Value Date/Time   PROT 6.4 02/28/2018 1128   ALBUMIN 4.5 02/28/2018 1128   AST 17 02/28/2018 1128   ALT 17 02/28/2018 1128   ALKPHOS 119 (H) 02/28/2018 1128   BILITOT 0.6 02/28/2018 1128      Component Value Date/Time   TSH 1.300 02/28/2018 1128    Ref. Range 02/28/2018 11:28  Vitamin D, 25-Hydroxy Latest Ref Range: 30.0 - 100.0 ng/mL 14.3 (L)   OBESITY BEHAVIORAL INTERVENTION VISIT  Today's visit was #2  Starting weight: 306 lbs Starting date: 02/28/2018 Today's weight: 303 lbs  Today's date: 03/14/2018 Total lbs lost to date: 3 At least 15 minutes were spent on discussing the following behavioral intervention visit.    Most Recent Value 03/21/2013 - 03/20/2018  Height 5\' 7"  (1.702 m) 03/14/2018  Weight 303 lb (137.4 kg) (A) 03/14/2018  BMI (Calculated) 47.45 03/14/2018  BLOOD PRESSURE - SYSTOLIC 141 03/14/2018  BLOOD PRESSURE - DIASTOLIC 85 03/14/2018  Waist Measurement  58 inches 02/28/2018   Body Fat % 54.8 % 03/14/2018  Total Body Water (lbs) 104.4 lbs 03/14/2018  RMR 2207 02/28/2018   ASK: We discussed the diagnosis of obesity with Paige [name] today and Paige [name] agreed to give [place] permission to discuss obesity behavioral modification therapy today.  ASSESS: Paige [name] has the diagnosis of obesity and her BMI today is 47.46. Paige [name] is in the action stage of change.   ADVISE: Paige [name] was educated on the multiple health risks of obesity as well as the benefit of weight loss to improve her health. Paige was advised of the need for long term treatment and the importance of lifestyle modifications to improve her current health and  to decrease her risk of future health problems.  AGREE: Multiple dietary modification options and  treatment options were discussed and  Paige [name] agreed to follow the recommendations documented in the above note.  ARRANGE: Paige [name] was educated on the importance of frequent visits to treat obesity as outlined per CMS and USPSTF guidelines and agreed to schedule her next follow up appointment today.  I, Paige [name], am acting as Energy managertranscriptionist for Paige Quincearen Marlane Hirschmann, MD  I have reviewed the above documentation for accuracy and completeness, and I agree with the above. -Paige Quincearen Chalyn Amescua, MD

## 2018-03-15 NOTE — Progress Notes (Signed)
Office: [phone]  /  Fax: [phone]    Date: March 15, 2018  Time Seen: 10:56am Duration: 56 minutes Provider: Lawerance Cruel, PsyD Type of Session: Intake for Individual Therapy  Type of Contact: Face-to-face  Informed Consent: The provider's role was explained to Bexleigh E Minix. The provider reviewed and discussed issues of confidentiality, privacy, and limits therein. In addition to verbal informed consent, written informed consent for psychological services was obtained from [facility] prior to the initial intake interview. Written consent included information concerning the practice, financial arrangements, and confidentiality and patients' rights. Since the clinic is not a 24/7 crisis center, mental health emergency resources were shared in the form of a handout, and the provider explained MyChart, e-mail, voicemail, and/or other messaging systems should be utilized only for non-emergency reasons. Bayler verbally acknowledged understanding of the aforementioned, and agreed to use mental health emergency resources discussed if needed. Moreover, Leila agreed information may be shared with other [facility] providers as needed for coordination of care, and written consent was obtained.   Chief Complaint: Sabrea was referred by Dr. Quillian Quince due to depression with emotional eating behaviors. Per the note for the visit with Dr. Quillian Quince on February 28, 2018, "Aspyn is on multiple medications and notes significant emotional eating. She sometimes feels her weight makes her life not worth loving, but she shows no sign of suicidal or homicidal ideations. She is struggling with emotional eating and using food for comfort to the extent that it is negatively impacting her health. She often snacks when she is not hungry. Liberta sometimes feels she is out of control and then feels guilty that she made poor food choices. She has been working on behavior modification techniques  to help reduce her emotional eating and has been somewhat successful."  During today's appointment, Garrie shared she engages in emotional eating. She explained her husband's passed away in 2016/06/06, and since then she described engaging in "sporadic eating" and "eating in front of the television." She also described grazing throughout the day. Mishael shared her last episode of emotional eating was last week due to "concern about [her] future" as it relates to the clinic resulting in her consuming yogurt with toppings. Ruchy shared she craves ice cream and pizza.   Chelsei was asked to complete a questionnaire assessing various behaviors related to emotional eating. Analeya endorsed the following: eat certain foods when you are anxious, stressed, depressed, or your feelings are hurt, use food to help you cope with emotional situations, find food is comforting to you, overeat frequently when you are bored or lonely, overeat when you are alone, but eat much less when you are with other people and eat as a reward.  HPI:  Per the note for the initial visit with Dr. Quillian Quince on February 28, 2018, "Troy is status post lap band weight loss surgery in 06/07/10. Her weight went from 330 lbs to 270 in 6 months. She started regaining after 1 year and is now at 306 pounds. She notes mild restriction with eating bread rapidly." During the initial appointment with Dr. Quillian Quince, Electa further reported experiencing the following: snacking frequently in the evenings, binge eating behaviors, struggling with emotional eating and waking up frquently in the middle of the night to eat. During today's appointment, Conchita reported having "problems" with emotional eating for "years." She explained her husband suffered from multiple sclerosis, and she would use food for comfort. Vallory also discussed eating out "a lot" due  to convenience. She denied a history of binge eating, but described engaging in overeating. Stefanny denied a history of  purging and engagement in other compensatory strategies, and has never been diagnosed with an eating disorder.  Mental Status Examination: Hokulani arrived early for the appointment. She presented as appropriately dressed and groomed. Shazia appeared her stated age and demonstrated adequate orientation to time, place, person, and purpose of the appointment. She also demonstrated appropriate eye contact. No psychomotor abnormalities or behavioral peculiarities noted; however, she was observed ambulating with a cane. Her mood was euthymic with congruent affect. Her thought processes were logical, linear, and goal-directed. No hallucinations, delusions, bizarre thinking or behavior reported or observed. Judgment, insight, and impulse control appeared to be grossly intact. There was no evidence of paraphasias (i.e., errors in speech, gross mispronunciations, and word substitutions), repetition deficits, or disturbances in volume or prosody (i.e., rhythm and intonation). There was no evidence of attention or memory impairments. Nakina denied current suicidal and homicidal ideation, plan, and intent.   Family & Psychosocial History: Melanee shared she is not in a relationship and does not have any children. She shared she lives with her two cats. Aleigh shared she retired five years ago and noted her highest level of education is a bachelor's of science degree. She shared her social support system consists of her cousin, Sunday school class, neighbor, and the woman who helps her with housework. Sharlon stated she identifies as Baptist, and she attends church once a month. Ashawnti further shared she attends an art class once a week.   Medical History:  Past Medical History:  Diagnosis Date  . Anemia   . Anxiety   . Arthritis   . Back pain   . Depression   . Fatigue   . Gallbladder problem   . GERD (gastroesophageal reflux disease)   . H/O hiatal hernia   . Hypertension    not being treated  . Joint pain   .  Lymphedema of both lower extremities   . Obesity   . Osteoarthritis   . PONV (postoperative nausea and vomiting)    after Cholecysectomy  . Restless legs syndrome   . Vertigo   . Vitamin B 12 deficiency    Past Surgical History:  Procedure Laterality Date  . ABDOMINAL HYSTERECTOMY    . CHOLECYSTECTOMY    . COLONOSCOPY    . EYE SURGERY Bilateral    cataracts  . FRACTURE SURGERY Right    arm age 8  . FRACTURE SURGERY Bilateral    both thumbs  . LAPAROSCOPIC GASTRIC BANDING     [age]  . OVARIAN CYST REMOVAL    . TOTAL HIP ARTHROPLASTY Left 11/30/2015   Procedure: TOTAL HIP ARTHROPLASTY ANTERIOR APPROACH;  Surgeon: Gean BirchwoodFrank Rowan, MD;  Location: [facility];  Service: Orthopedics;  Laterality: Left;  . TOTAL KNEE ARTHROPLASTY Left 09/25/2013   Procedure: TOTAL KNEE ARTHROPLASTY;  Surgeon: Nestor LewandowskyFrank J Rowan, MD;  Location: [facility];  Service: Orthopedics;  Laterality: Left;  . TOTAL KNEE ARTHROPLASTY Right 02/10/2014   dr Turner Danielsrowan  . TOTAL KNEE ARTHROPLASTY Right 02/10/2014   Procedure: TOTAL KNEE ARTHROPLASTY;  Surgeon: Nestor LewandowskyFrank J Rowan, MD;  Location: [facility];  Service: Orthopedics;  Laterality: Right;   Current Outpatient Medications on File Prior to Visit  Medication Sig Dispense Refill  . buPROPion (WELLBUTRIN XL) 150 MG 24 hr tablet Take 150 mg by mouth daily.  4  . PARoxetine (PAXIL) 40 MG tablet Take 40 mg by mouth every morning.      .Marland Kitchen  pramipexole (MIRAPEX) 0.25 MG tablet Take 0.25 mg by mouth 2 (two) times daily.     . traZODone (DESYREL) 150 MG tablet Take 150 mg by mouth at bedtime.  10  . Vitamin D, Ergocalciferol, (DRISDOL) 1.25 MG (50000 UT) CAPS capsule Take 1 capsule (50,000 Units total) by mouth every 7 (seven) days. 4 capsule 0   No current facility-administered medications on file prior to visit.   Ronalda denied a history of head injuries and loss of consciousness. She shared she is having surgery in two weeks due to "droopy eye lids."  Mental Health History: Laverta attended individual  therapy 30 to 40 years ago for self-esteem issues for approximately six weeks. She denied ever meeting with a psychiatrist and has never been hospitalized for psychiatric concerns. She shared her primary care physician currently prescribes Wellbutrin, Paxil, and Trazodone. It was recommended she follow up with her PCP as she reported she is not "feeling the effects" of the medications. She agreed. Regarding family history of mental health related concerns, Jaiyah reported her mother suffered from depression. Berea denied a trauma history, including psychological, physical  and sexual abuse, as well as neglect.   Khristine described her mood as "hanging on." During the holidays, she explained she experience hopelessness in the form of "Diona Browner, I am just waiting to die." Since the holidays, Kaaren reported a decrease in hopelessness and added she currently feels hopeful. Gayle further added, "Losing weight will help." She indicated she experiences difficulty staying asleep due to frequent urination resulting in her averaging approximately 6 hours of sleep at night. She also endorsed experiencing decreased motivation as well as fatigue. Cindia denied use of recreational and illicit substances, but noted she consumes alcohol "once in a while." She noted it is "rarely" and in the form of a glass of wine with dinner.  Sahirah denied experiencing the following: appetite issues, decreased self-esteem, attention and concentration issues, memory concerns, feeling fidgety/restless, irritability, obsessions and compulsions, hallucinations and delusions, mania, angry outbursts, social withdrawal and worry thoughts. She also denied history of and current suicidal ideation, plan, and intent; history of and current homicidal ideation, plan, and intent; and history of and current engagement in self-harm. Notably, Anet endorsed item 9 on the modified PHQ-9 during her visit with Dr. Dalbert Garnet on February 28, 2018.  During today's appointment,  she clarified she endorsed that item due to hopelessness regarding food and not due to suicidal ideation.  The following strengths were reported by Ivonne: nice to others, caring, respectful, and accepting of others. The following strengths were observed by this provider: ability to express thoughts and feelings during the therapeutic session, ability to establish and benefit from a therapeutic relationship, ability to learn and practice coping skills, willingness to work toward established goal(s) with the clinic and ability to engage in reciprocal conversation.  Legal History: Ryah denied a history of legal involvement.   Structured Assessment Results: The Patient Health Questionnaire-9 (PHQ-9) is a self-report measure that assesses symptoms and severity of depression over the course of the last two weeks. Albena obtained a score of 10 suggesting moderate depression. Andrea finds the endorsed symptoms to be somewhat difficult. Depression screen PHQ 2/9 03/15/2018  Decreased Interest 1  Down, Depressed, Hopeless 1  PHQ - 2 Score 2  Altered sleeping 3  Tired, decreased energy 3  Change in appetite 2  Feeling bad or failure about yourself  0  Trouble concentrating 0  Moving slowly or fidgety/restless 0  Suicidal thoughts 0  PHQ-9 Score 10  Difficult doing work/chores -   The Generalized Anxiety Disorder-7 (GAD-7) is a brief self-report measure that assesses symptoms of anxiety over the course of the last two weeks. Shruthi obtained a score of zero. GAD 7 : Generalized Anxiety Score 03/15/2018  Nervous, Anxious, on Edge 0  Control/stop worrying 0  Worry too much - different things 0  Trouble relaxing 0  Restless 0  Easily annoyed or irritable 0  Afraid - awful might happen 0  Total GAD 7 Score 0   Interventions: A chart review was conducted prior to the clinical intake interview. The PHQ-9 and GAD-7 were administered and a clinical intake interview was completed. In addition, Dorethea was asked  to complete a Mood and Food questionnaire to assess various behaviors related to emotional eating. Throughout session, empathic reflections and validation was provided. Continuing treatment with this provider was discussed and a treatment goal was established. Psychoeducation regarding emotional versus physical hunger was provided. Paulene was given a handout to utilize between now and the next appointment to increase awareness of hunger patterns and subsequent eating.   Provisional DSM-5 Diagnosis: 311 (F32.8) Other Specified Depressive Disorder, Emotional Eating Behaviors  Plan: Katanya appears able and willing to participate as evidenced by collaboration on a treatment goal, engagement in reciprocal conversation, and asking questions as needed for clarification. The next appointment will be scheduled in two weeks. The following treatment goal was established: decrease emotional eating. For the aforementioned goal, Herman can benefit from biweekly individual therapy sessions that are brief in duration for approximately four to six sessions. The treatment modality will be individual therapeutic services, including an eclectic therapeutic approach utilizing techniques from Cognitive Behavioral Therapy, Patient Centered Therapy, Dialectical Behavior Therapy, Acceptance and Commitment Therapy, Interpersonal Therapy, and Cognitive Restructuring. Therapeutic approach will include various interventions as appropriate, such as validation, support, mindfulness, thought defusion, reframing, psychoeducation, values assessment, and role playing. This provider will regularly review the treatment plan and medical chart to keep informed of status changes. Kamarah expressed understanding and agreement with the initial treatment plan of care.

## 2018-03-26 DIAGNOSIS — H0230 Blepharochalasis unspecified eye, unspecified eyelid: Secondary | ICD-10-CM | POA: Diagnosis not present

## 2018-03-26 NOTE — Progress Notes (Signed)
Office: [phone]  /  Fax: [phone]    Date: March 28, 2018   Time Seen: 8:28am Duration: 32 minutes Provider: Lawerance Cruel, Psy.D. Type of Session: Individual Therapy  Type of Contact: Face-to-face  Session Content: Paige [name] is a 73 y.o. female presenting for a follow-up appointment to address the previously established treatment goal of decreasing emotional eating. The session was initiated with the administration of the PHQ-9 and GAD-7, as well as a brief check-in. Paige [name] shared she has eye lid surgery tomorrow, and noted, "I think I'll be alright." Regarding eating, Paige [name] shared, "I'm still having trouble fitting in all my food." Thus, she was engaged in problem solving. Paige [name] and this provider discussed moving foods around from the structured meal plan to avoid feeling overstuffed and experiencing possible vomiting after any meal. In regard to emotional eating,  Paige [name] shared, "I haven't really felt it. I had to remind myself to eat." She attributed the absence of emotional eating episodes to "wanting to learn how to eat the right way." Psychoeducation regarding triggers for emotional eating was provided. Paige [name] was provided a handout, and encouraged to utilize the handout between now and the next appointment to increase awareness of triggers and frequency. Paige [name] agreed. This provider also discussed behavioral strategies for specific triggers, such as placing the utensil down when conversing to avoid mindless eating. Paige [name] was receptive to today's session as evidenced by openness to sharing, responsiveness to feedback, and willingness to identify triggers for emotional eating.  Mental Status Examination: Paige [name] arrived early for the appointment. She presented as appropriately dressed and groomed. Paige [name] appeared her stated age and demonstrated adequate orientation to time, place, person, and purpose of the appointment. She also demonstrated appropriate eye contact. No psychomotor abnormalities or  behavioral peculiarities noted. Her mood was euthymic with congruent affect. Her thought processes were logical, linear, and goal-directed. No hallucinations, delusions, bizarre thinking or behavior reported or observed. Judgment, insight, and impulse control appeared to be grossly intact. There was no evidence of paraphasias (i.e., errors in speech, gross mispronunciations, and word substitutions), repetition deficits, or disturbances in volume or prosody (i.e., rhythm and intonation). There was no evidence of attention or memory impairments. Paige [name] denied current suicidal and homicidal ideation, plan and intent.   Structured Assessment Results: The Patient Health Questionnaire-9 (PHQ-9) is a self-report measure that assesses symptoms and severity of depression over the course of the last two weeks. Paige [name] obtained a score of 3 suggesting minimal depression. Paige [name] finds the endorsed symptoms to be not difficult at all. Depression screen Paige [name] 2/9 03/28/2018  Decreased Interest 0  Down, Depressed, Hopeless 0  PHQ - 2 Score 0  Altered sleeping 1  Tired, decreased energy 1  Change in appetite 1  Feeling bad or failure about yourself  0  Trouble concentrating 0  Moving slowly or fidgety/restless 0  Suicidal thoughts 0  PHQ-9 Score 3  Difficult doing work/chores -   The Generalized Anxiety Disorder-7 (GAD-7) is a brief self-report measure that assesses symptoms of anxiety over the course of the last two weeks. Paige [name] obtained a score of zero. GAD 7 : Generalized Anxiety Score 03/28/2018  Nervous, Anxious, on Edge 0  Control/stop worrying 0  Worry too much - different things 0  Trouble relaxing 0  Restless 0  Easily annoyed or irritable 0  Afraid - awful might happen 0  Total GAD 7 Score 0   Interventions:  Administration of PHQ-9 and GAD-7 for symptom monitoring Review of content from the previous session  Empathic reflections and validation Problem solving Psychoeducation regarding triggers for  emotional eating Positive reinforcement Rapport building Brief chart review  DSM-5 Diagnosis: 311 (F32.8) Other Specified Depressive Disorder, Emotional Eating Behaviors  Treatment Goal & Progress: During the initial appointment with this provider, the following treatment goal was established: decrease emotional eating. Progress is limited, as Paige [name] has just begun treatment with this provider; however, she is receptive to the interaction and interventions and rapport is being established. Nevertheless, Paige [name] has demonstrated some progress in her goal as evidenced by increased awareness of hunger patterns. She also denied episodes of emotional eating since the last appointment with this provider.  Plan: Paige [name] continues to appear able and willing to participate as evidenced by engagement in reciprocal conversation, and asking questions for clarification as appropriate. Due to her surgery tomorrow and subsequent recovery, the next appointment will be scheduled in three weeks. The next session will focus on reviewing triggers for emotional eating, and working towards the established treatment goal.

## 2018-03-28 ENCOUNTER — Ambulatory Visit (INDEPENDENT_AMBULATORY_CARE_PROVIDER_SITE_OTHER): Payer: Medicare Other | Admitting: Family Medicine

## 2018-03-28 ENCOUNTER — Ambulatory Visit (INDEPENDENT_AMBULATORY_CARE_PROVIDER_SITE_OTHER): Payer: Medicare Other | Admitting: Psychology

## 2018-03-28 VITALS — BP 138/80 | HR 82 | Temp 97.9°F | Ht 67.0 in | Wt 300.0 lb

## 2018-03-28 DIAGNOSIS — F3289 Other specified depressive episodes: Secondary | ICD-10-CM

## 2018-03-28 DIAGNOSIS — E559 Vitamin D deficiency, unspecified: Secondary | ICD-10-CM | POA: Diagnosis not present

## 2018-03-28 DIAGNOSIS — E8881 Metabolic syndrome: Secondary | ICD-10-CM

## 2018-03-28 DIAGNOSIS — Z6841 Body Mass Index (BMI) 40.0 and over, adult: Secondary | ICD-10-CM | POA: Diagnosis not present

## 2018-03-28 MED ORDER — VITAMIN D (ERGOCALCIFEROL) 1.25 MG (50000 UNIT) PO CAPS: 50000.0000 [IU] | ORAL_CAPSULE | ORAL | 0 refills | Status: DC

## 2018-03-28 NOTE — Progress Notes (Signed)
Office: [phone]  /  Fax: [phone]   HPI:   Chief Complaint: OBESITY Paige [name] is here to discuss her progress with her obesity treatment plan. She is on the Category 3 plan and is following her eating plan approximately 80-85% of the time. She states she is exercising 0 minutes 0 times per week. Paige [name] is eating all of the protein on the plan mostly and reports being very satisfied with the plan.  Her weight is 300 lb (136.1 kg) today and has had a weight loss of 3 pounds over a period of 2 weeks since her last visit. She has lost 6 lbs since starting treatment with [place].  Insulin Resistance Paige [name] has a diagnosis of insulin resistance based on her elevated fasting insulin level >5. Although Paige [name]'s blood glucose readings are still under good control, insulin resistance puts her at greater risk of metabolic syndrome and diabetes. She is not taking metformin currently and continues to work on diet and exercise to decrease risk of diabetes. She denies polyphagia.  Vitamin D deficiency Paige [name] has a diagnosis of Vitamin D deficiency which is not at goal. Her last Vitamin D level was reported low at 14.3 on 02/28/2018. She is currently taking prescription Vit D and denies nausea, vomiting or muscle weakness.  ASSESSMENT AND PLAN:  Vitamin D deficiency - Plan: Vitamin D, Ergocalciferol, (DRISDOL) 1.25 MG (50000 UT) CAPS capsule  Insulin resistance  Class 3 severe obesity with serious comorbidity and body mass index (BMI) of 45.0 to 49.9 in adult, unspecified obesity type (HCC)  PLAN:  Insulin Resistance Mykal will continue to work on weight loss, exercise, and decreasing simple carbohydrates in her diet to help decrease the risk of diabetes.  Sharde is not on metformin and prescription was not written today. Latrisa agreed to follow-up with [place] as directed to monitor her progress.  Vitamin D Deficiency Paige [name] was informed that low Vitamin D levels contributes to fatigue and are associated with  obesity, breast, and colon cancer. She agrees to continue to take prescription Vit D @ 50,000 IU every week #4 with 0 refills and will follow-up for routine testing of Vitamin D, at least 2-3 times per year. She was informed of the risk of over-replacement of Vitamin D and agrees to not increase her dose unless she discusses this with [place] first. Paige [name] agrees to follow-up with our clinic in 3 weeks.  Obesity Paige [name] is currently in the action stage of change. As such, her goal is to continue with weight loss efforts. She has agreed to follow the Category 3 plan. We discussed the following Behavioral Modification Strategies today: increasing lean protein intake and planning for success. Paige [name] has not been prescribed exercise at this time. Paige [name] has agreed to follow-up with our clinic in 3 weeks. She was informed of the importance of frequent follow up visits to maximize her success with intensive lifestyle modifications for her multiple health conditions.  ALLERGIES: Allergies  Allergen Reactions  . No Known Allergies     MEDICATIONS: Current Outpatient Medications on File Prior to Visit  Medication Sig Dispense Refill  . buPROPion (WELLBUTRIN XL) 150 MG 24 hr tablet Take 150 mg by mouth daily.  4  . PARoxetine (PAXIL) 40 MG tablet Take 40 mg by mouth every morning.      . pramipexole (MIRAPEX) 0.25 MG tablet Take 0.25 mg by mouth 2 (two) times daily.     . traZODone (DESYREL) 150 MG tablet Take 150 mg by mouth at bedtime.  10   No current facility-administered medications on file prior to visit.     PAST MEDICAL HISTORY: Past Medical History:  Diagnosis Date  . Anemia   . Anxiety   . Arthritis   . Back pain   . Depression   . Fatigue   . Gallbladder problem   . GERD (gastroesophageal reflux disease)   . H/O hiatal hernia   . Hypertension    not being treated  . Joint pain   . Lymphedema of both lower extremities   . Obesity   . Osteoarthritis   . PONV (postoperative nausea  and vomiting)    after Cholecysectomy  . Restless legs syndrome   . Vertigo   . Vitamin B 12 deficiency     PAST SURGICAL HISTORY: Past Surgical History:  Procedure Laterality Date  . ABDOMINAL HYSTERECTOMY    . CHOLECYSTECTOMY    . COLONOSCOPY    . EYE SURGERY Bilateral    cataracts  . FRACTURE SURGERY Right    arm age 58  . FRACTURE SURGERY Bilateral    both thumbs  . LAPAROSCOPIC GASTRIC BANDING     2010  . OVARIAN CYST REMOVAL    . TOTAL HIP ARTHROPLASTY Left 11/30/2015   Procedure: TOTAL HIP ARTHROPLASTY ANTERIOR APPROACH;  Surgeon: Gean Birchwood, MD;  Location: [facility] OR;  Service: Orthopedics;  Laterality: Left;  . TOTAL KNEE ARTHROPLASTY Left 09/25/2013   Procedure: TOTAL KNEE ARTHROPLASTY;  Surgeon: Nestor Lewandowsky, MD;  Location: [facility] OR;  Service: Orthopedics;  Laterality: Left;  . TOTAL KNEE ARTHROPLASTY Right 02/10/2014   dr Turner Daniels  . TOTAL KNEE ARTHROPLASTY Right 02/10/2014   Procedure: TOTAL KNEE ARTHROPLASTY;  Surgeon: Nestor Lewandowsky, MD;  Location: [facility] OR;  Service: Orthopedics;  Laterality: Right;    SOCIAL HISTORY: Social History   Tobacco Use  . Smoking status: Never Smoker  . Smokeless tobacco: Never Used  Substance Use Topics  . Alcohol use: Yes    Comment: rarely  . Drug use: No    FAMILY HISTORY: Family History  Problem Relation Age of Onset  . Hypertension Mother   . Depression Mother    ROS: Review of Systems  Constitutional: Positive for weight loss.  Gastrointestinal: Negative for nausea and vomiting.  Musculoskeletal:       Negative for muscle weakness.  Endo/Heme/Allergies:       Negative for polyphagia. Negative for hypoglycemia.   PHYSICAL EXAM: Blood pressure 138/80, pulse 82, temperature 97.9 F (36.6 C), temperature source Oral, height 5\' 7"  (1.702 m), weight 300 lb (136.1 kg), SpO2 93 %. Body mass index is 46.99 kg/m. Physical Exam Vitals signs reviewed.  Constitutional:      Appearance: Normal appearance. She is obese.    Cardiovascular:     Rate and Rhythm: Normal rate.     Pulses: Normal pulses.  Pulmonary:     Effort: Pulmonary effort is normal.     Breath sounds: Normal breath sounds.  Musculoskeletal: Normal range of motion.  Skin:    General: Skin is warm and dry.  Neurological:     Mental Status: She is alert and oriented to person, place, and time.  Psychiatric:        Behavior: Behavior normal.   RECENT LABS AND TESTS: BMET    Component Value Date/Time   NA 138 02/28/2018 1128   K 4.5 02/28/2018 1128   CL 97 02/28/2018 1128   CO2 24 02/28/2018 1128   GLUCOSE 96 02/28/2018 1128   GLUCOSE  104 (H) 12/01/2015 0702   BUN 15 02/28/2018 1128   CREATININE 1.06 (H) 02/28/2018 1128   CALCIUM 9.5 02/28/2018 1128   GFRNONAA 53 (L) 02/28/2018 1128   GFRAA 61 02/28/2018 1128   Lab Results  Component Value Date   HGBA1C 4.9 02/28/2018   Lab Results  Component Value Date   INSULIN 11.2 02/28/2018   CBC    Component Value Date/Time   WBC 6.4 02/28/2018 1128   WBC 13.6 (H) 12/02/2015 0659   RBC 5.13 02/28/2018 1128   RBC 3.64 (L) 12/02/2015 0659   HGB 15.9 02/28/2018 1128   HCT 47.5 (H) 02/28/2018 1128   PLT 188 12/02/2015 0659   MCV 93 02/28/2018 1128   MCH 31.0 02/28/2018 1128   MCH 32.1 12/02/2015 0659   MCHC 33.5 02/28/2018 1128   MCHC 33.4 12/02/2015 0659   RDW 13.0 02/28/2018 1128   LYMPHSABS 0.9 02/28/2018 1128   MONOABS 0.4 11/19/2015 0957   EOSABS 0.1 02/28/2018 1128   BASOSABS 0.0 02/28/2018 1128   Iron/TIBC/Ferritin/ %Sat No results found for: IRON, TIBC, FERRITIN, IRONPCTSAT Lipid Panel     Component Value Date/Time   CHOL 177 02/28/2018 1128   TRIG 92 02/28/2018 1128   HDL 56 02/28/2018 1128   LDLCALC 103 (H) 02/28/2018 1128   Hepatic Function Panel     Component Value Date/Time   PROT 6.4 02/28/2018 1128   ALBUMIN 4.5 02/28/2018 1128   AST 17 02/28/2018 1128   ALT 17 02/28/2018 1128   ALKPHOS 119 (H) 02/28/2018 1128   BILITOT 0.6 02/28/2018 1128       Component Value Date/Time   TSH 1.300 02/28/2018 1128    Ref. Range 02/28/2018 11:28  Vitamin D, 25-Hydroxy Latest Ref Range: 30.0 - 100.0 ng/mL 14.3 (L)   OBESITY BEHAVIORAL INTERVENTION VISIT  Today's visit was #3  Starting weight: 306 lbs Starting date: 02/28/2018 Today's weight: 300 lbs Today's date: 03/28/2018 Total lbs lost to date: 6 At least 15 minutes were spent on discussing the following behavioral intervention visit.    03/28/2018  Height 5\' 7"  (1.702 m)  Weight 300 lb (136.1 kg)  BMI (Calculated) 46.98  BLOOD PRESSURE - SYSTOLIC 138  BLOOD PRESSURE - DIASTOLIC 80   Body Fat % 55.6 %   ASK: We discussed the diagnosis of obesity with Paige [name] today and Paige [name] agreed to give [place] permission to discuss obesity behavioral modification therapy today.  ASSESS: Paige [name] has the diagnosis of obesity and her BMI today is 46.98. Paige [name] is in the action stage of change.   ADVISE: Paige [name] was educated on the multiple health risks of obesity as well as the benefit of weight loss to improve her health. She was advised of the need for long term treatment and the importance of lifestyle modifications to improve her current health and to decrease her risk of future health problems.  AGREE: Multiple dietary modification options and treatment options were discussed and  Paige [name] agreed to follow the recommendations documented in the above note.  ARRANGE: Kristene was educated on the importance of frequent visits to treat obesity as outlined per CMS and USPSTF guidelines and agreed to schedule her next follow up appointment today.  IMarianna Payment, am acting as Energy manager for [place], FNP-C.  I have reviewed the above documentation for accuracy and completeness, and I agree with the above.  - Corin Tilly, FNP-C.

## 2018-03-29 ENCOUNTER — Encounter (INDEPENDENT_AMBULATORY_CARE_PROVIDER_SITE_OTHER): Payer: Self-pay | Admitting: Family Medicine

## 2018-03-29 DIAGNOSIS — H02834 Dermatochalasis of left upper eyelid: Secondary | ICD-10-CM | POA: Diagnosis not present

## 2018-03-29 DIAGNOSIS — E88819 Insulin resistance, unspecified: Secondary | ICD-10-CM | POA: Insufficient documentation

## 2018-03-29 DIAGNOSIS — H0234 Blepharochalasis left upper eyelid: Secondary | ICD-10-CM | POA: Diagnosis not present

## 2018-03-29 DIAGNOSIS — E8881 Metabolic syndrome: Secondary | ICD-10-CM | POA: Insufficient documentation

## 2018-03-29 DIAGNOSIS — H02831 Dermatochalasis of right upper eyelid: Secondary | ICD-10-CM | POA: Diagnosis not present

## 2018-03-29 DIAGNOSIS — E559 Vitamin D deficiency, unspecified: Secondary | ICD-10-CM | POA: Insufficient documentation

## 2018-03-29 DIAGNOSIS — Z6841 Body Mass Index (BMI) 40.0 and over, adult: Secondary | ICD-10-CM

## 2018-03-29 DIAGNOSIS — H0231 Blepharochalasis right upper eyelid: Secondary | ICD-10-CM | POA: Diagnosis not present

## 2018-03-29 HISTORY — DX: Vitamin D deficiency, unspecified: E55.9

## 2018-03-29 HISTORY — DX: Body Mass Index (BMI) 40.0 and over, adult: Z684

## 2018-03-29 HISTORY — DX: Morbid (severe) obesity due to excess calories: E66.01

## 2018-03-29 HISTORY — DX: Metabolic syndrome: E88.81

## 2018-03-29 HISTORY — DX: Insulin resistance, unspecified: E88.819

## 2018-04-18 ENCOUNTER — Ambulatory Visit (INDEPENDENT_AMBULATORY_CARE_PROVIDER_SITE_OTHER): Payer: Self-pay | Admitting: Psychology

## 2018-04-18 ENCOUNTER — Ambulatory Visit (INDEPENDENT_AMBULATORY_CARE_PROVIDER_SITE_OTHER): Payer: Self-pay | Admitting: Family Medicine

## 2018-04-19 ENCOUNTER — Encounter (INDEPENDENT_AMBULATORY_CARE_PROVIDER_SITE_OTHER): Payer: Self-pay

## 2018-04-23 ENCOUNTER — Encounter (INDEPENDENT_AMBULATORY_CARE_PROVIDER_SITE_OTHER): Payer: Self-pay | Admitting: Family Medicine

## 2018-04-23 NOTE — Telephone Encounter (Signed)
Please address

## 2018-04-25 ENCOUNTER — Encounter (INDEPENDENT_AMBULATORY_CARE_PROVIDER_SITE_OTHER): Payer: Self-pay

## 2018-04-25 ENCOUNTER — Ambulatory Visit (INDEPENDENT_AMBULATORY_CARE_PROVIDER_SITE_OTHER): Payer: Medicare Other | Admitting: Psychology

## 2018-04-25 ENCOUNTER — Ambulatory Visit (INDEPENDENT_AMBULATORY_CARE_PROVIDER_SITE_OTHER): Payer: Medicare Other | Admitting: Family Medicine

## 2018-04-25 ENCOUNTER — Encounter (INDEPENDENT_AMBULATORY_CARE_PROVIDER_SITE_OTHER): Payer: Self-pay | Admitting: Family Medicine

## 2018-04-25 ENCOUNTER — Other Ambulatory Visit: Payer: Self-pay

## 2018-04-25 DIAGNOSIS — Z6841 Body Mass Index (BMI) 40.0 and over, adult: Secondary | ICD-10-CM

## 2018-04-25 DIAGNOSIS — F3289 Other specified depressive episodes: Secondary | ICD-10-CM

## 2018-04-25 DIAGNOSIS — E559 Vitamin D deficiency, unspecified: Secondary | ICD-10-CM | POA: Diagnosis not present

## 2018-04-25 MED ORDER — VITAMIN D (ERGOCALCIFEROL) 1.25 MG (50000 UNIT) PO CAPS: 50000.0000 [IU] | ORAL_CAPSULE | ORAL | 0 refills | Status: DC

## 2018-04-25 NOTE — Progress Notes (Addendum)
Office: [phone]  /  Fax: [phone]    Date: April 25, 2018   Appointment Start Time: 8:58am Duration: 34 minutes Provider: Lawerance Cruel, Psy.D. Type of Session: Individual Therapy  Location of Patient: Home Location of Provider: Office  Type of Contact: Telepsychological Visit via [employer]   Session Content: Prior to initiating telepsychological services, Paige [name] was provided with an informed consent document via e-mail, which included the development of a safety plan (I.e., an emergency contact and emergency resources) in the event of an emergency/crisis. Paige [name] returned the completed consent form prior to today's appointment via MyChart as this provider's clinic is closed. This provider verbally reviewed the consent form during today's appointment prior to proceeding with the appointment. Paige [name] verbally acknowledged understanding that she is ultimately responsible for understanding her insurance benefits as it relates to reimbursement of telepsychological services. This provider also reviewed confidentiality, as it relates to telepsychological services, as well as the rationale for telepsychological services. More specifically, this provider's clinic is closed for in-person visits due to COVID-19. Therapeutic services will resume to in-person appointments once the clinic re-opens. Paige [name] expressed understanding regarding the rationale for telepsychological services. In addition, this provider explained the telepsychological services informed consent document would be considered an addendum to the initial consent document. Paige [name] verbally consented to proceed. Regarding MyChart messages, Jaynia verbally acknowledged understanding that any messages sent would be a part of her electronic medical record; therefore, visible to all providers.     Paige [name] is a 73 y.o. female presenting via [employer] for a follow-up appointment to address the previously established treatment goal of decreasing  emotional eating. Prior to proceeding with today's appointment, Paige [name]'s physical location at the time of this appointment was obtained. Paige [name] reported she was at home and provided the address. In the event of technical difficulties, Paige [name] shared a phone number she could be reached at. Paige [name] and this provider participated in today's telepsychological service. Also, Paige [name] denied anyone else being present in the room or on the [employer]. After verbally discussing the informed consent, this provider verbally administered the PHQ-9 and GAD-7, and conducted a brief check-in. Paige [name] shared she has been staying at home and "following the diet." She added, "I have been keeping in touch with people on the phone." She further shared her eye surgery went well and there has been an increase in reading since the surgery. Regarding COVID-19, Emmery shared experiencing distress and described feeling "disgusted" by recent events. This provider assisted in processing associated thoughts and feelings, and recommended that [employer] obtain information from [facility].gov versus other outlets. She was agreeable. Regarding emotional eating, Paige [name] denied episodes and explained, "I seem to have developed a new mindset." Moreover, psychoeducation regarding mindfulness was provided. This provider also explained the benefit of mindfulness as it relates to emotional eating. Paige [name] was encouraged to engage in the provided exercises between now and the next appointment with this provider. Paige [name] agreed. She was also led through a mindfulness exercise involving her 5 senses. This provider sent Paige [name] a MyChart message with a handout for mindfulness. Prior to sending the message, this provider explained the message would be visible to all providers, as it would be part of the electronic medical record. Paige [name] verbally acknowledged understanding, and verbally consented to this provider sending the MyChart message. Paige [name] was receptive to today's session as  evidenced by openness to sharing, responsiveness to feedback, and willingness to practice mindfulness.  Mental Status Examination:  Appearance: neat Behavior: cooperative Mood: euthymic Affect: mood congruent  Speech: normal in rate, volume, and tone Eye Contact: appropriate Psychomotor Activity: appropriate Thought Process: linear, logical, and goal directed  Content/Perceptual Disturbances: denies suicidal and homicidal ideation, plan, and intent and no hallucinations, delusions, bizarre thinking or behavior reported or observed Orientation: time, person, place and purpose of appointment Cognition/Sensorium: memory, attention, language, and fund of knowledge intact  Insight: good Judgment: good  Structured Assessment Results: The Patient Health Questionnaire-9 (PHQ-9) is a self-report measure that assesses symptoms and severity of depression over the course of the last two weeks. Paige [name] obtained a score of 2 suggesting minimal depression. Paige [name] finds the endorsed symptoms to be not difficult at all. Depression screen PHQ 2/9 04/25/2018  Decreased Interest 1  Down, Depressed, Hopeless 0  PHQ - 2 Score 1  Altered sleeping 0  Tired, decreased energy 1  Change in appetite 1  Feeling bad or failure about yourself  0  Trouble concentrating 0  Moving slowly or fidgety/restless 0  Suicidal thoughts 0  PHQ-9 Score 3  Difficult doing work/chores -   The Generalized Anxiety Disorder-7 (GAD-7) is a brief self-report measure that assesses symptoms of anxiety over the course of the last two weeks. Paige [name] obtained a score of zero. GAD 7 : Generalized Anxiety Score 04/25/2018  Nervous, Anxious, on Edge 0  Control/stop worrying 0  Worry too much - different things 0  Trouble relaxing 0  Restless 0  Easily annoyed or irritable 0  Afraid - awful might happen 0  Total GAD 7 Score 0   Interventions:  Administration of PHQ-9 and GAD-7 for symptom monitoring Review of content from the previous  session Empathic reflections and validation Processing thoughts and feelings Psychoeducation regarding mindfulness Mindfulness exercise Positive reinforcement Brief chart review  DSM-5 Diagnosis: 311 (F32.8) Other Specified Depressive Disorder, Emotional Eating Behaviors  Treatment Goal & Progress: During the initial appointment with this provider, the following treatment goal was established: decrease emotional eating. Paige [name] has demonstrated progress in her goal as evidenced by increased awareness of hunger patterns and triggers for emotional eating.  Plan: Paige [name] continues to appear able and willing to participate as evidenced by engagement in reciprocal conversation, and asking questions for clarification as appropriate. The next appointment will be scheduled in two weeks, which will be via [employer]. Once this provider's office resumes in-person appointments, Paige [name] will be notified. The next session will focus further on mindfulness.

## 2018-04-25 NOTE — Progress Notes (Signed)
Office: [phone]  /  Fax: [phone] TeleHealth Visit:  Paige [name] has verbally consented to this TeleHealth visit today. The patient is located at home, the provider is located at the [facility] office. The participants in this visit include the listed provider and patient. The visit was conducted today via Webex.  HPI:   Chief Complaint: OBESITY Paige [name] is here to discuss her progress with her obesity treatment plan. She is on the Category 3 plan and is following her eating plan approximately 50% of the time. She states she is exercising 0 minutes 0 times per week. Paige [name] states she weighed 292.4 on her home scales today. She reports eating all of the protein on the plan and thinks she has lost 8 lbs. She does state she is not eating enough vegetables. She is status post lap band surgery, which limits her quantity of food. We were unable to weigh the patient today for this TeleHealth visit. She feels as if she has lost weight since her last visit. She has lost 6 lbs since starting treatment with [place].  Depression with emotional eating behaviors Paige [name] denies stress eating. She states she spoke with Dr. Dewaine Conger and states her stress eating tendencies have improved greatly. She has been working on behavior modification techniques to help reduce her emotional eating and has been  successful. She shows no sign of suicidal or homicidal ideations.  Depression screen Paige [name] 2/9 04/25/2018 03/28/2018 03/15/2018 02/28/2018  Decreased Interest 1 0 1 3  Down, Depressed, Hopeless 0 0 1 3  PHQ - 2 Score 1 0 2 6  Altered sleeping 0 1 3 3   Tired, decreased energy 1 1 3 3   Change in appetite 1 1 2 3   Feeling bad or failure about yourself  0 0 0 0  Trouble concentrating 0 0 0 0  Moving slowly or fidgety/restless 0 0 0 1  Suicidal thoughts 0 0 0 1  PHQ-9 Score 3 3 10 17   Difficult doing work/chores - - - Somewhat difficult   Vitamin D deficiency Paige [name] has a diagnosis of Vitamin D  deficiency, which is not at goal. Her last Vitamin D level was reported to be 14.3 on 02/28/2018. She is currently taking prescription Vit D and denies nausea, vomiting or muscle weakness.  ASSESSMENT AND PLAN:  Vitamin D deficiency - Plan: Vitamin D, Ergocalciferol, (DRISDOL) 1.25 MG (50000 UT) CAPS capsule  Other depression - with emotional eating  Class 3 severe obesity with serious comorbidity and body mass index (BMI) of 45.0 to 49.9 in adult, unspecified obesity type (HCC)  PLAN:  Depression with Emotional Eating Behaviors We discussed behavior modification techniques today to help Paige [name] deal with her emotional eating and depression. Paige [name] was advised to continue meeting with Dr. Dewaine Conger.  Vitamin D Deficiency Paige [name] was informed that low Vitamin D levels contributes to fatigue and are associated with obesity, breast, and colon cancer. She agrees to continue to take prescription Vit D @ 50,000 IU every week #4 with 0 refills and will follow-up for routine testing of Vitamin D, at least 2-3 times per year. She was informed of the risk of over-replacement of Vitamin D and agrees to not increase her dose unless she discusses this with [place] first. Paige [name] agrees to follow-up with our clinic in 2-3 weeks.  Obesity Paige [name] is currently in the action stage of change. As such, her goal is to continue with weight loss efforts. She has agreed to follow the Category 3 plan.  We discussed the following Behavioral Modification Strategies today: increasing lean protein intake, increasing vegetables, emotional eating strategies, and planning for success. Paige [name] has not been prescribed exercise at this time. Paige [name] has agreed to follow-up with our clinic in 2-3 weeks. She was informed of the importance of frequent follow-up visits to maximize her success with intensive lifestyle modifications for her multiple health conditions.  ALLERGIES: Allergies  Allergen Reactions   No Known Allergies      MEDICATIONS: Current Outpatient Medications on File Prior to Visit  Medication Sig Dispense Refill   buPROPion (WELLBUTRIN XL) 150 MG 24 hr tablet Take 150 mg by mouth daily.  4   PARoxetine (PAXIL) 40 MG tablet Take 40 mg by mouth every morning.       pramipexole (MIRAPEX) 0.25 MG tablet Take 0.25 mg by mouth 2 (two) times daily.      traZODone (DESYREL) 150 MG tablet Take 150 mg by mouth at bedtime.  10   No current facility-administered medications on file prior to visit.     PAST MEDICAL HISTORY: Past Medical History:  Diagnosis Date   Anemia    Anxiety    Arthritis    Back pain    Depression    Fatigue    Gallbladder problem    GERD (gastroesophageal reflux disease)    H/O hiatal hernia    Hypertension    not being treated   Joint pain    Lymphedema of both lower extremities    Obesity    Osteoarthritis    PONV (postoperative nausea and vomiting)    after Cholecysectomy   Restless legs syndrome    Vertigo    Vitamin B 12 deficiency     PAST SURGICAL HISTORY: Past Surgical History:  Procedure Laterality Date   ABDOMINAL HYSTERECTOMY     CHOLECYSTECTOMY     COLONOSCOPY     EYE SURGERY Bilateral    cataracts   FRACTURE SURGERY Right    arm age 21   FRACTURE SURGERY Bilateral    both thumbs   LAPAROSCOPIC GASTRIC BANDING     2010   OVARIAN CYST REMOVAL     TOTAL HIP ARTHROPLASTY Left 11/30/2015   Procedure: TOTAL HIP ARTHROPLASTY ANTERIOR APPROACH;  Surgeon: Gean Birchwood, MD;  Location: [facility];  Service: Orthopedics;  Laterality: Left;   TOTAL KNEE ARTHROPLASTY Left 09/25/2013   Procedure: TOTAL KNEE ARTHROPLASTY;  Surgeon: Nestor Lewandowsky, MD;  Location: [facility];  Service: Orthopedics;  Laterality: Left;   TOTAL KNEE ARTHROPLASTY Right 02/10/2014   dr Turner Daniels   TOTAL KNEE ARTHROPLASTY Right 02/10/2014   Procedure: TOTAL KNEE ARTHROPLASTY;  Surgeon: Nestor Lewandowsky, MD;  Location: [facility];  Service: Orthopedics;  Laterality: Right;     SOCIAL HISTORY: Social History   Tobacco Use   Smoking status: Never Smoker   Smokeless tobacco: Never Used  Substance Use Topics   Alcohol use: Yes    Comment: rarely   Drug use: No    FAMILY HISTORY: Family History  Problem Relation Age of Onset   Hypertension Mother    Depression Mother    ROS: Review of Systems  Gastrointestinal: Negative for nausea and vomiting.  Musculoskeletal:       Negative for muscle weakness.  Psychiatric/Behavioral: Positive for depression (emotional eating).   PHYSICAL EXAM: Pt in no acute distress  RECENT LABS AND TESTS: BMET    Component Value Date/Time   NA 138 02/28/2018 1128   K 4.5 02/28/2018 1128   CL  97 02/28/2018 1128   CO2 24 02/28/2018 1128   GLUCOSE 96 02/28/2018 1128   GLUCOSE 104 (H) 12/01/2015 0702   BUN 15 02/28/2018 1128   CREATININE 1.06 (H) 02/28/2018 1128   CALCIUM 9.5 02/28/2018 1128   GFRNONAA 53 (L) 02/28/2018 1128   GFRAA 61 02/28/2018 1128   Lab Results  Component Value Date   HGBA1C 4.9 02/28/2018   Lab Results  Component Value Date   INSULIN 11.2 02/28/2018   CBC    Component Value Date/Time   WBC 6.4 02/28/2018 1128   WBC 13.6 (H) 12/02/2015 0659   RBC 5.13 02/28/2018 1128   RBC 3.64 (L) 12/02/2015 0659   HGB 15.9 02/28/2018 1128   HCT 47.5 (H) 02/28/2018 1128   PLT 188 12/02/2015 0659   MCV 93 02/28/2018 1128   MCH 31.0 02/28/2018 1128   MCH 32.1 12/02/2015 0659   MCHC 33.5 02/28/2018 1128   MCHC 33.4 12/02/2015 0659   RDW 13.0 02/28/2018 1128   LYMPHSABS 0.9 02/28/2018 1128   MONOABS 0.4 11/19/2015 0957   EOSABS 0.1 02/28/2018 1128   BASOSABS 0.0 02/28/2018 1128   Iron/TIBC/Ferritin/ %Sat No results found for: IRON, TIBC, FERRITIN, IRONPCTSAT Lipid Panel     Component Value Date/Time   CHOL 177 02/28/2018 1128   TRIG 92 02/28/2018 1128   HDL 56 02/28/2018 1128   LDLCALC 103 (H) 02/28/2018 1128   Hepatic Function Panel     Component Value Date/Time   PROT 6.4  02/28/2018 1128   ALBUMIN 4.5 02/28/2018 1128   AST 17 02/28/2018 1128   ALT 17 02/28/2018 1128   ALKPHOS 119 (H) 02/28/2018 1128   BILITOT 0.6 02/28/2018 1128      Component Value Date/Time   TSH 1.300 02/28/2018 1128   Results for SANJUANITA, HOFFMEYER (MRN [id number]) as of 04/25/2018 15:51  Ref. Range 02/28/2018 11:28  Vitamin D, 25-Hydroxy Latest Ref Range: 30.0 - 100.0 ng/mL 14.3 (L)    I, Marianna Payment, am acting as Energy manager for [place], FNP-C.  I have reviewed the above documentation for accuracy and completeness, and I agree with the above.  - Dawn Whitmire, FNP-C.

## 2018-04-26 ENCOUNTER — Encounter (INDEPENDENT_AMBULATORY_CARE_PROVIDER_SITE_OTHER): Payer: Self-pay | Admitting: Family Medicine

## 2018-04-26 DIAGNOSIS — F32A Depression, unspecified: Secondary | ICD-10-CM | POA: Insufficient documentation

## 2018-04-26 DIAGNOSIS — F329 Major depressive disorder, single episode, unspecified: Secondary | ICD-10-CM | POA: Insufficient documentation

## 2018-05-08 NOTE — Progress Notes (Signed)
Office: [phone]  /  Fax: [phone]    Date: May 09, 2018   Appointment Start Time: 8:57am Duration: 32 minutes Provider: Lawerance Cruel, Psy.D. Type of Session: Individual Therapy  Location of Patient: Home Location of Provider: Home Type of Contact: Telepsychological Visit via [employer] Webex   Session Content: Paige [name] is a 73 y.o. female presenting via [employer] Webex for a follow-up appointment to address the previously established treatment goal of decreasing emotional eating. Today's appointment was a telepsychological visit, as this provider's clinic is closed for in-person visits due to COVID-19. Therapeutic services will resume to in-person appointments once the clinic re-opens. Paige [name] expressed understanding regarding the rationale for telepsychological services. Prior to proceeding with today's appointment, Paige [name]'s physical location at the time of this appointment was obtained. Paige [name] reported she was at home and provided the address. In the event of technical difficulties, Paige [name] shared a phone number she could be reached at. Paige [name] and this provider participated in today's telepsychological service. Also, Paige [name] denied anyone else being present in the room or on the [employer].   This provider conducted a brief check-in. Paige [name] shared, "I've been trying to follow the plan. It hasn't been 100%." This was explored. She explained, "It's just a lot of food." Paige [name] explained protein has been the most difficult to consume. More specifically, she indicated, "I don't eat as much meat." This provider recommended she speak further with Adah Salvage, FNP-C for other protein options; Paige [name] agreed. Paige [name] denied episodes of emotional eating since the last appointment with this provider. She acknowledged she "forget[s]" to eat at times. Thus, this provider recommended setting alarms to eat. She agreed, and noted, "That makes sense." Aside from eating, Paige [name] stated, "I keep having reoccurring dreams where  I am back at my old job." She further shared she experiences difficulty making deadlines, and running into other obstacles in her dreams. This provider reflected that some details coincide with recent events. After waking up, Paige [name] indicated feeling, "I'm just glad the dream wasn't real." She indicated she reflects on her current life to help reduce anxiety. This provider also recommended she engage in the exercise involving her five senses to help ground her in the present moment; Paige [name] agreed. Moreover, Paige [name] shared experiencing chest pains resulting in her calling 911 last Thursday. She was informed her "EKG looked normal;" however, she was told she was having "irregular heart beats." Paige [name] declined going to the hospital due to COVID-19. This provider recommended following-up with her PCP and calling 911 if needed in the future; Paige [name] agreed. She also noted a plan to call her PCP after today's appointment. Moreover, this provider verbally administered the PHQ-9 and GAD-7. A reduction in symptomatology on the PHQ-9 was reflected. Paige [name] shared, "I feel happier" and she noted, "I view the glass half full." Regarding mindfulness, Paige [name] stated she has not "used it that much" because she did not feel she need it. This provider discussed the utilization of YouTube for mindfulness exercises, specifically exercises by Rhae Hammock. Paige [name] was agreeable to engaging in one mindfulness exercise a day. Paige [name] was receptive to today's session as evidenced by openness to sharing, responsiveness to feedback, and willingness to continue engaging in mindfulness.  Mental Status Examination:  Appearance: neat Behavior: cooperative Mood: euthymic Affect: mood congruent Speech: normal in rate, volume, and tone Eye Contact: appropriate Psychomotor Activity: appropriate Thought Process: linear, logical, and goal directed  Content/Perceptual Disturbances: denies suicidal and homicidal ideation, plan, and intent and no  hallucinations, delusions, bizarre thinking or  behavior reported or observed Orientation: time, person, place and purpose of appointment Cognition/Sensorium: memory, attention, language, and fund of knowledge intact  Insight: good Judgment: good  Structured Assessment Results: The Patient Health Questionnaire-9 (PHQ-9) is a self-report measure that assesses symptoms and severity of depression over the course of the last two weeks. Paige [name] obtained a score of 1 suggesting minimal depression. Paige [name] finds the endorsed symptoms to be not difficult at all. Depression screen Paige [name] LP 2/9 05/09/2018  Decreased Interest 0  Down, Depressed, Hopeless 0  PHQ - 2 Score 0  Altered sleeping 0  Tired, decreased energy 1  Change in appetite -  Feeling bad or failure about yourself  0  Trouble concentrating 0  Moving slowly or fidgety/restless 0  Suicidal thoughts 0  PHQ-9 Score 1  Difficult doing work/chores -   The Generalized Anxiety Disorder-7 (GAD-7) is a brief self-report measure that assesses symptoms of anxiety over the course of the last two weeks. Shalin obtained a score of 0. GAD 7 : Generalized Anxiety Score 05/09/2018  Nervous, Anxious, on Edge 0  Control/stop worrying 0  Worry too much - different things 0  Trouble relaxing 0  Restless 0  Easily annoyed or irritable 0  Afraid - awful might happen 0  Total GAD 7 Score 0   Interventions:  Administration of PHQ-9 and GAD-7 for symptom monitoring Review of content from the previous session Empathic reflections and validation Problem solving Psychoeducation regarding mindfulness Positive reinforcement Brief chart review Employed supportive psychotherapy interventions today to facilitate reduced distress, and to improve coping skills with identified stressors  DSM-5 Diagnosis: 311 (F32.8) Other Specified Depressive Disorder, Emotional Eating Behaviors  Treatment Goal & Progress: During the initial appointment with this provider, the  following treatment goal was established: decrease emotional eating. Delaney has demonstrated progress in her goal as evidenced by increased awareness of hunger patterns and triggers for emotional eating. She denied episodes of emotional eating since the last appointment with this provider, and continues to demonstrate willingness to engage in learned skills.   Plan: Tris continues to appear able and willing to participate as evidenced by engagement in reciprocal conversation, and asking questions for clarification as appropriate. The next appointment will be scheduled in two weeks, which will be via [employer]. Once this provider's office resumes in-person appointments, Kennia will be notified. The next session will focus further on mindfulness.

## 2018-05-09 ENCOUNTER — Other Ambulatory Visit: Payer: Self-pay

## 2018-05-09 ENCOUNTER — Ambulatory Visit (INDEPENDENT_AMBULATORY_CARE_PROVIDER_SITE_OTHER): Payer: Medicare Other | Admitting: Psychology

## 2018-05-09 DIAGNOSIS — F3289 Other specified depressive episodes: Secondary | ICD-10-CM

## 2018-05-16 ENCOUNTER — Encounter (INDEPENDENT_AMBULATORY_CARE_PROVIDER_SITE_OTHER): Payer: Self-pay | Admitting: Family Medicine

## 2018-05-16 ENCOUNTER — Other Ambulatory Visit: Payer: Self-pay

## 2018-05-16 ENCOUNTER — Ambulatory Visit (INDEPENDENT_AMBULATORY_CARE_PROVIDER_SITE_OTHER): Payer: Medicare Other | Admitting: Family Medicine

## 2018-05-16 DIAGNOSIS — Z6841 Body Mass Index (BMI) 40.0 and over, adult: Secondary | ICD-10-CM | POA: Diagnosis not present

## 2018-05-16 DIAGNOSIS — F3289 Other specified depressive episodes: Secondary | ICD-10-CM | POA: Diagnosis not present

## 2018-05-16 DIAGNOSIS — E559 Vitamin D deficiency, unspecified: Secondary | ICD-10-CM | POA: Diagnosis not present

## 2018-05-16 MED ORDER — VITAMIN D (ERGOCALCIFEROL) 1.25 MG (50000 UNIT) PO CAPS: 50000.0000 [IU] | ORAL_CAPSULE | ORAL | 0 refills | Status: DC

## 2018-05-16 NOTE — Progress Notes (Signed)
Office: [phone]  /  Fax: [phone] TeleHealth Visit:  Paige [name] has verbally consented to this TeleHealth visit today. The patient is located at home, the provider is located at the [facility] office. The participants in this visit include the listed provider and patient. The visit was conducted today via Face Time.  HPI:   Chief Complaint: OBESITY Paige [name] is here to discuss her progress with her obesity treatment plan. She is on the Category 3 plan and is following her eating plan approximately 85 to 90 % of the time. She states she is exercising 0 minutes 0 times per week. Paige [name] weighed 290 pounds today reflecting an almost 2.5 pound weight loss. She had pizza on her birthday and enjoyed it and is back on plan. Paige [name] is staying busy at home with chores and  painting pictures.  We were unable to weigh the patient today for this TeleHealth visit. She feels as if she has lost weight since her last visit. She has lost 6 lbs since starting treatment with [place].  Vitamin D Deficiency [name] has a diagnosis of vitamin D deficiency. She is currently on vit D, but is not at goal. Her last vitamin D level was 14.3 on 02/28/18. Paige [name] denies nausea, vomiting, or muscle weakness.  Depression with emotional eating behaviors Paige [name] is seeing Dr. Dewaine Conger and had an appointment scheduled for 05/23/18. Her stress eating is very well controlled. She has been working on behavior modification techniques to help reduce her emotional eating and has been successful.   Depression screen Paige [name] Spring 2/9 05/09/2018 04/25/2018 03/28/2018 03/15/2018 02/28/2018  Decreased Interest 0 1 0 1 3  Down, Depressed, Hopeless 0 0 0 1 3  PHQ - 2 Score 0 1 0 2 6  Altered sleeping 0 0 1 3 3   Tired, decreased energy 1 1 1 3 3   Change in appetite - 1 1 2 3   Feeling bad or failure about yourself  0 0 0 0 0  Trouble concentrating 0 0 0 0 0  Moving slowly or fidgety/restless 0 0 0 0 1  Suicidal thoughts 0 0 0 0 1  PHQ-9  Score 1 3 3 10 17   Difficult doing work/chores - - - - Somewhat difficult   ASSESSMENT AND PLAN:  Vitamin D deficiency - Plan: Vitamin D, Ergocalciferol, (DRISDOL) 1.25 MG (50000 UT) CAPS capsule  Other depression - with emotional eating  Class 3 severe obesity with serious comorbidity and body mass index (BMI) of 45.0 to 49.9 in adult, unspecified obesity type (HCC)  PLAN:  Vitamin D Deficiency [name] was informed that low vitamin D levels contribute to fatigue and are associated with obesity, breast, and colon cancer. [name] agrees to continue to take prescription Vit D @50 ,000 IU every week #4 with no refills and will follow up for routine testing of vitamin D, at least 2-3 times per year. She was informed of the risk of over-replacement of vitamin D and agrees to not increase her dose unless she discusses this with [place] first. [name] agrees to follow up in 2 weeks as directed.  Depression with Emotional Eating Behaviors We discussed behavior modification techniques today to help Paige [name] deal with her emotional eating and depression. She has agreed to continue taking her bupropion and strategies for emotional eating. She will continue seeing Dr. Dewaine Conger and agreed to follow up as directed. Paige [name] agreed with this plan.  Obesity Paige [name] is currently in the action stage of change. As such, her goal is  to continue with weight loss efforts. She has agreed to follow the Category 3 plan and we discussed carb exchanges for bread, such as sweet potatoes or rice (100 calorie serving). We discussed the following Behavioral Modification Strategies today: work on meal planning and easy cooking plans, planning for success, and emotional eating strategies.  Paige [name] has agreed to follow up with our clinic in 2 weeks. She was informed of the importance of frequent follow up visits to maximize her success with intensive lifestyle modifications for her multiple health conditions.  ALLERGIES: Allergies  Allergen  Reactions  . No Known Allergies     MEDICATIONS: Current Outpatient Medications on File Prior to Visit  Medication Sig Dispense Refill  . buPROPion (WELLBUTRIN XL) 150 MG 24 hr tablet Take 150 mg by mouth daily.  4  . PARoxetine (PAXIL) 40 MG tablet Take 40 mg by mouth every morning.      . pramipexole (MIRAPEX) 0.25 MG tablet Take 0.25 mg by mouth 2 (two) times daily.     . traZODone (DESYREL) 150 MG tablet Take 150 mg by mouth at bedtime.  10  . Vitamin D, Ergocalciferol, (DRISDOL) 1.25 MG (50000 UT) CAPS capsule Take 1 capsule (50,000 Units total) by mouth every 7 (seven) days. 4 capsule 0   No current facility-administered medications on file prior to visit.     PAST MEDICAL HISTORY: Past Medical History:  Diagnosis Date  . Anemia   . Anxiety   . Arthritis   . Back pain   . Depression   . Fatigue   . Gallbladder problem   . GERD (gastroesophageal reflux disease)   . H/O hiatal hernia   . Hypertension    not being treated  . Joint pain   . Lymphedema of both lower extremities   . Obesity   . Osteoarthritis   . PONV (postoperative nausea and vomiting)    after Cholecysectomy  . Restless legs syndrome   . Vertigo   . Vitamin B 12 deficiency     PAST SURGICAL HISTORY: Past Surgical History:  Procedure Laterality Date  . ABDOMINAL HYSTERECTOMY    . CHOLECYSTECTOMY    . COLONOSCOPY    . EYE SURGERY Bilateral    cataracts  . FRACTURE SURGERY Right    arm age 51  . FRACTURE SURGERY Bilateral    both thumbs  . LAPAROSCOPIC GASTRIC BANDING     2010  . OVARIAN CYST REMOVAL    . TOTAL HIP ARTHROPLASTY Left 11/30/2015   Procedure: TOTAL HIP ARTHROPLASTY ANTERIOR APPROACH;  Surgeon: Gean Birchwood, MD;  Location: [facility];  Service: Orthopedics;  Laterality: Left;  . TOTAL KNEE ARTHROPLASTY Left 09/25/2013   Procedure: TOTAL KNEE ARTHROPLASTY;  Surgeon: Nestor Lewandowsky, MD;  Location: [facility];  Service: Orthopedics;  Laterality: Left;  . TOTAL KNEE ARTHROPLASTY Right 02/10/2014    dr Turner Daniels  . TOTAL KNEE ARTHROPLASTY Right 02/10/2014   Procedure: TOTAL KNEE ARTHROPLASTY;  Surgeon: Nestor Lewandowsky, MD;  Location: [facility];  Service: Orthopedics;  Laterality: Right;    SOCIAL HISTORY: Social History   Tobacco Use  . Smoking status: Never Smoker  . Smokeless tobacco: Never Used  Substance Use Topics  . Alcohol use: Yes    Comment: rarely  . Drug use: No    FAMILY HISTORY: Family History  Problem Relation Age of Onset  . Hypertension Mother   . Depression Mother     ROS: Review of Systems  Gastrointestinal: Negative for nausea and vomiting.  Musculoskeletal:       Negative for muscle weakness.  Psychiatric/Behavioral: Positive for depression.    PHYSICAL EXAM: Pt in no acute distress  RECENT LABS AND TESTS: BMET    Component Value Date/Time   NA 138 02/28/2018 1128   K 4.5 02/28/2018 1128   CL 97 02/28/2018 1128   CO2 24 02/28/2018 1128   GLUCOSE 96 02/28/2018 1128   GLUCOSE 104 (H) 12/01/2015 0702   BUN 15 02/28/2018 1128   CREATININE 1.06 (H) 02/28/2018 1128   CALCIUM 9.5 02/28/2018 1128   GFRNONAA 53 (L) 02/28/2018 1128   GFRAA 61 02/28/2018 1128   Lab Results  Component Value Date   HGBA1C 4.9 02/28/2018   Lab Results  Component Value Date   INSULIN 11.2 02/28/2018   CBC    Component Value Date/Time   WBC 6.4 02/28/2018 1128   WBC 13.6 (H) 12/02/2015 0659   RBC 5.13 02/28/2018 1128   RBC 3.64 (L) 12/02/2015 0659   HGB 15.9 02/28/2018 1128   HCT 47.5 (H) 02/28/2018 1128   PLT 188 12/02/2015 0659   MCV 93 02/28/2018 1128   MCH 31.0 02/28/2018 1128   MCH 32.1 12/02/2015 0659   MCHC 33.5 02/28/2018 1128   MCHC 33.4 12/02/2015 0659   RDW 13.0 02/28/2018 1128   LYMPHSABS 0.9 02/28/2018 1128   MONOABS 0.4 11/19/2015 0957   EOSABS 0.1 02/28/2018 1128   BASOSABS 0.0 02/28/2018 1128   Iron/TIBC/Ferritin/ %Sat No results found for: IRON, TIBC, FERRITIN, IRONPCTSAT Lipid Panel     Component Value Date/Time   CHOL 177 02/28/2018  1128   TRIG 92 02/28/2018 1128   HDL 56 02/28/2018 1128   LDLCALC 103 (H) 02/28/2018 1128   Hepatic Function Panel     Component Value Date/Time   PROT 6.4 02/28/2018 1128   ALBUMIN 4.5 02/28/2018 1128   AST 17 02/28/2018 1128   ALT 17 02/28/2018 1128   ALKPHOS 119 (H) 02/28/2018 1128   BILITOT 0.6 02/28/2018 1128      Component Value Date/Time   TSH 1.300 02/28/2018 1128   Results for GEORGIANN, NEIDER (MRN [id number]) as of 05/16/2018 11:40  Ref. Range 02/28/2018 11:28  Vitamin D, 25-Hydroxy Latest Ref Range: 30.0 - 100.0 ng/mL 14.3 (L)     I, Kirke Corin, CMA, am acting as Energy manager for [employer], FNP-C.  I have reviewed the above documentation for accuracy and completeness, and I agree with the above.  - Kateleen Encarnacion, FNP-C.

## 2018-05-23 ENCOUNTER — Other Ambulatory Visit: Payer: Self-pay

## 2018-05-23 ENCOUNTER — Ambulatory Visit (INDEPENDENT_AMBULATORY_CARE_PROVIDER_SITE_OTHER): Payer: Medicare Other | Admitting: Psychology

## 2018-05-23 DIAGNOSIS — F3289 Other specified depressive episodes: Secondary | ICD-10-CM

## 2018-05-23 NOTE — Progress Notes (Signed)
Office: [phone]  /  Fax: [phone]    Date: May 23, 2018   Appointment Start Time: 11:04am Duration: 26 minutes Provider: Lawerance Cruel, Psy.D. Type of Session: Individual Therapy  Location of Patient: Home Location of Provider: Provider's Home Type of Contact: Telepsychological Visit via [employer] Web[employer]   Session Content: Paige [name] is a 73 y.o. female presenting via [employer] WebEx for a follow-up appointment to address the previously established treatment goal of decreasing emotional eating. Today's appointment was a telepsychological visit, as this provider's clinic is closed for in-person visits due to COVID-19. Therapeutic services will resume to in-person appointments once the clinic re-opens. Hargun expressed understanding regarding the rationale for telepsychological services, and provided verbal consent for today's appointment. Prior to proceeding with today's appointment, [name]'s physical location at the time of this appointment was obtained. [name] reported she was at home and provided the address. In the event of technical difficulties, [name] shared a phone number she could be reached at. Hellon and this provider participated in today's telepsychological service. Also, [name] denied anyone else being present in the room or on the WebEx appointment.  Notably, this provider called [name] at 11:02am, as she did not present for today's WebEx appointment. She explained she could not find the e-mail sent by this provider; therefore, the e-mail was resent. As such, the appointment was initiated 4 minutes late.  This provider verbally administered the PHQ-9 and GAD-7, and conducted a brief check-in. [name] shared, "I've been staying home like everyone, and it's getting boring." She indicated she ordered pizza for her birthday and had a piece of carrot cake. [name] indicated she felt "guilty," but she enjoyed her birthday meal. The aforementioned was explored. She acknowledged eating congruent to the  meal plan for the other meals. This was positively reinforced. To assist with the boredom, this provider explored what [name] enjoyed doing prior to the pandemic. She acknowledged she used to paint, travel, engage in crafting, and go the movies. As such, this provider and [name] discussed ways to incorporate the aforementioned activities to the current circumstances. Additionally, psychoeducation regarding pleasurable activities, including its impact on emotional eating and overall well-being was provided. [name] was provided with a handout via e-mail with various options of pleasurable activities, and was encouraged to engage in different activities between now and the next appointment with this provider. [name] agreed. Also, this provider and [name] discussed engaging in activities outside to help with a change of pace. This provider further discussed engaging in the mindfulness exercise using the senses while engaging in pleasurable activities to help cultivate awareness of the present moment, and enhance the activity; [name] agreed. Furthermore, this provider and [name] discussed termination planning. At the onset of therapeutic services the duration of treatment was discussed to be a total of 4 to 6 sessions; however, due to the pandemic, this provider will continue meeting [name] as deemed necessary and appropriate. Maryln verbally acknowledged understanding of the aforementioned, and was receptive to double-checking insurance benefits. At this time, the frequency of appointments will be reduced, as [name] reported she is doing well overall. [name] was receptive to today's session as evidenced by openness to sharing, responsiveness to feedback, and willingness to engage in various pleasurable activities.  Mental Status Examination:  Appearance: neat Behavior: cooperative Mood: euthymic Affect: mood congruent Speech: normal in rate, volume, and tone Eye Contact: appropriate Psychomotor Activity: appropriate  Thought Process: linear, logical, and goal directed  Content/Perceptual Disturbances: denies suicidal and homicidal ideation, plan, and intent and no hallucinations,  delusions, bizarre thinking or behavior reported or observed Orientation: time, person, place and purpose of appointment Cognition/Sensorium: memory, attention, language, and fund of knowledge intact  Insight: good Judgment: good  Structured Assessment Results: The Patient Health Questionnaire-9 (PHQ-9) is a self-report measure that assesses symptoms and severity of depression over the course of the last two weeks. [name] obtained a score of 1 suggesting minimal depression. [name] finds the endorsed symptoms to be not difficult at all. Depression screen [facility] 2/9 05/23/2018  Decreased Interest 0  Down, Depressed, Hopeless 0  PHQ - 2 Score 0  Altered sleeping 0  Tired, decreased energy 1  Change in appetite 0  Feeling bad or failure about yourself  0  Trouble concentrating 0  Moving slowly or fidgety/restless 0  Suicidal thoughts 0  PHQ-9 Score 1  Difficult doing work/chores -   The Generalized Anxiety Disorder-7 (GAD-7) is a brief self-report measure that assesses symptoms of anxiety over the course of the last two weeks. [name] obtained a score of 0. GAD 7 : Generalized Anxiety Score 05/23/2018  Nervous, Anxious, on Edge 0  Control/stop worrying 0  Worry too much - different things 0  Trouble relaxing 0  Restless 0  Easily annoyed or irritable 0  Afraid - awful might happen 0  Total GAD 7 Score 0   Interventions:  Administered PHQ-9 and GAD-7 for symptom monitoring Reviewed content from the previous session Provided empathic reflections and validation Psychoeducation provided regarding pleasurable activities Discussed termination planning Provided positive reinforcement Employed supportive psychotherapy interventions today to facilitate reduced distress, and to improve coping skills with identified stressors Conducted  a brief chart review  DSM-5 Diagnosis: 311 (F32.8) Other Specified Depressive Disorder, Emotional Eating Behaviors  Treatment Goal & Progress: During the initial appointment with this provider, the following treatment goal was established: decrease emotional eating. Blaise has demonstrated progress in her goal as evidenced by increased awareness of hunger patterns and triggers for emotional eating. She continues to demonstrate willingness to engage in learned skills.   Plan: Christian continues to appear able and willing to participate as evidenced by engagement in reciprocal conversation, and asking questions for clarification as appropriate. The next appointment will be scheduled in three weeks, which will be via [employer]. Once this provider's office resumes in-person appointments, Viktoria will be notified. The next session will focus further on mindfulness, and the introduction of thought defusion.

## 2018-05-30 ENCOUNTER — Other Ambulatory Visit: Payer: Self-pay

## 2018-05-30 ENCOUNTER — Encounter (INDEPENDENT_AMBULATORY_CARE_PROVIDER_SITE_OTHER): Payer: Self-pay | Admitting: Family Medicine

## 2018-05-30 ENCOUNTER — Ambulatory Visit (INDEPENDENT_AMBULATORY_CARE_PROVIDER_SITE_OTHER): Payer: Medicare Other | Admitting: Family Medicine

## 2018-05-30 DIAGNOSIS — E8881 Metabolic syndrome: Secondary | ICD-10-CM

## 2018-05-30 DIAGNOSIS — Z6841 Body Mass Index (BMI) 40.0 and over, adult: Secondary | ICD-10-CM

## 2018-05-30 DIAGNOSIS — E559 Vitamin D deficiency, unspecified: Secondary | ICD-10-CM | POA: Diagnosis not present

## 2018-05-30 MED ORDER — VITAMIN D (ERGOCALCIFEROL) 1.25 MG (50000 UNIT) PO CAPS: 50000.0000 [IU] | ORAL_CAPSULE | ORAL | 0 refills | Status: DC

## 2018-05-30 NOTE — Progress Notes (Signed)
Office: [phone]  /  Fax: [phone] TeleHealth Visit:  Paige [name] has verbally consented to this TeleHealth visit today. The patient is located at home, the provider is located at the [facility] office. The participants in this visit include the listed provider and patient. The visit was conducted today via Webex.  HPI:   Chief Complaint: OBESITY Paige [name] is here to discuss her progress with her obesity treatment plan. She is on the Category 3 plan and is following her eating plan approximately 80% of the time. She states she is exercising 0 minutes 0 times per week. Paige [name] states she does not always eat the vegetables on the plan but is eating most of the protein on the plan. She reports sticking to the plan most of the time. We were unable to weigh the patient today for this TeleHealth visit. She feels as if she has lost 1 lb since her last visit. She has lost 6 lbs since starting treatment with [place].  Insulin Resistance Paige [name] has a diagnosis of insulin resistance based on her elevated fasting insulin level >5. Although Paige [name]'s blood glucose readings are still under good control, insulin resistance puts her at greater risk of metabolic syndrome and diabetes. She is not taking metformin currently and continues to work on diet and exercise to decrease risk of diabetes. No polyphagia. She states her appetite is decreased over what it used to be. Lab Results  Component Value Date   HGBA1C 4.9 02/28/2018    Vitamin D deficiency Paige [name] has a diagnosis of Vitamin D deficiency, which is not at goal. Her last Vitamin D level was reported to be 14.3 on 02/28/2018. She is currently taking prescription Vit D and denies nausea, vomiting or muscle weakness.  ASSESSMENT AND PLAN:  Insulin resistance  Vitamin D deficiency - Plan: Vitamin D, Ergocalciferol, (DRISDOL) 1.25 MG (50000 UT) CAPS capsule  Class 3 severe obesity with serious comorbidity and body mass index (BMI) of  45.0 to 49.9 in adult, unspecified obesity type (HCC)  PLAN:  Insulin Resistance Paige [name] will continue to work on weight loss, exercise, and decreasing simple carbohydrates in her diet to help decrease the risk of diabetes. We dicussed metformin including benefits and risks. She was informed that eating too many simple carbohydrates or too many calories at one sitting increases the likelihood of GI side effects. Paige [name] will continue her meal plan and follow-up with [place] as directed to monitor her progress.  Vitamin D Deficiency Paige [name] was informed that low Vitamin D levels contributes to fatigue and are associated with obesity, breast, and colon cancer. She agrees to continue to take prescription Vit D @ 50,000 IU every week #4 with 0 refills and will follow-up for routine testing of Vitamin D, at least 2-3 times per year. She was informed of the risk of over-replacement of Vitamin D and agrees to not increase her dose unless she discusses this with [place] first. Paige [name] agrees to follow-up with our clinic (the patient refused follow-up as she wants to wait until she can physically come in).  Obesity Paige [name] is currently in the action stage of change. As such, her goal is to continue with weight loss efforts. She has agreed to follow the Category 3 plan. Paige [name] has been instructed to ride her exercise bike 30 minutes 3 times per week for weight loss and overall health benefits. We discussed the following Behavioral Modification Strategies today: increasing lean protein intake, increasing vegetables, and planning for success.  Paige [name] refused  follow-up as she wants to wait until she can physically come in. She was informed of the importance of frequent follow up visits to maximize her success with intensive lifestyle modifications for her multiple health conditions.  ALLERGIES: Allergies  Allergen Reactions  . No Known Allergies     MEDICATIONS: Current Outpatient Medications on File Prior to Visit   Medication Sig Dispense Refill  . buPROPion (WELLBUTRIN XL) 150 MG 24 hr tablet Take 150 mg by mouth daily.  4  . PARoxetine (PAXIL) 40 MG tablet Take 40 mg by mouth every morning.      . pramipexole (MIRAPEX) 0.25 MG tablet Take 0.25 mg by mouth 2 (two) times daily.     . traZODone (DESYREL) 150 MG tablet Take 150 mg by mouth at bedtime.  10   No current facility-administered medications on file prior to visit.     PAST MEDICAL HISTORY: Past Medical History:  Diagnosis Date  . Anemia   . Anxiety   . Arthritis   . Back pain   . Depression   . Fatigue   . Gallbladder problem   . GERD (gastroesophageal reflux disease)   . H/O hiatal hernia   . Hypertension    not being treated  . Joint pain   . Lymphedema of both lower extremities   . Obesity   . Osteoarthritis   . PONV (postoperative nausea and vomiting)    after Cholecysectomy  . Restless legs syndrome   . Vertigo   . Vitamin B 12 deficiency     PAST SURGICAL HISTORY: Past Surgical History:  Procedure Laterality Date  . ABDOMINAL HYSTERECTOMY    . CHOLECYSTECTOMY    . COLONOSCOPY    . EYE SURGERY Bilateral    cataracts  . FRACTURE SURGERY Right    arm age 89  . FRACTURE SURGERY Bilateral    both thumbs  . LAPAROSCOPIC GASTRIC BANDING     2010  . OVARIAN CYST REMOVAL    . TOTAL HIP ARTHROPLASTY Left 11/30/2015   Procedure: TOTAL HIP ARTHROPLASTY ANTERIOR APPROACH;  Surgeon: Gean Birchwood, MD;  Location: [facility] OR;  Service: Orthopedics;  Laterality: Left;  . TOTAL KNEE ARTHROPLASTY Left 09/25/2013   Procedure: TOTAL KNEE ARTHROPLASTY;  Surgeon: Nestor Lewandowsky, MD;  Location: [facility] OR;  Service: Orthopedics;  Laterality: Left;  . TOTAL KNEE ARTHROPLASTY Right 02/10/2014   dr Turner Daniels  . TOTAL KNEE ARTHROPLASTY Right 02/10/2014   Procedure: TOTAL KNEE ARTHROPLASTY;  Surgeon: Nestor Lewandowsky, MD;  Location: [facility] OR;  Service: Orthopedics;  Laterality: Right;    SOCIAL HISTORY: Social History   Tobacco Use  . Smoking status:  Never Smoker  . Smokeless tobacco: Never Used  Substance Use Topics  . Alcohol use: Yes    Comment: rarely  . Drug use: No    FAMILY HISTORY: Family History  Problem Relation Age of Onset  . Hypertension Mother   . Depression Mother    ROS: Review of Systems  Gastrointestinal: Negative for nausea and vomiting.  Musculoskeletal:       Negative for muscle weakness.  Endo/Heme/Allergies:       Negative for polyphagia.   PHYSICAL EXAM: Pt in no acute distress  RECENT LABS AND TESTS: BMET    Component Value Date/Time   NA 138 02/28/2018 1128   K 4.5 02/28/2018 1128   CL 97 02/28/2018 1128   CO2 24 02/28/2018 1128   GLUCOSE 96 02/28/2018 1128   GLUCOSE 104 (H) 12/01/2015 3295  BUN 15 02/28/2018 1128   CREATININE 1.06 (H) 02/28/2018 1128   CALCIUM 9.5 02/28/2018 1128   GFRNONAA 53 (L) 02/28/2018 1128   GFRAA 61 02/28/2018 1128   Lab Results  Component Value Date   HGBA1C 4.9 02/28/2018   Lab Results  Component Value Date   INSULIN 11.2 02/28/2018   CBC    Component Value Date/Time   WBC 6.4 02/28/2018 1128   WBC 13.6 (H) 12/02/2015 0659   RBC 5.13 02/28/2018 1128   RBC 3.64 (L) 12/02/2015 0659   HGB 15.9 02/28/2018 1128   HCT 47.5 (H) 02/28/2018 1128   PLT 188 12/02/2015 0659   MCV 93 02/28/2018 1128   MCH 31.0 02/28/2018 1128   MCH 32.1 12/02/2015 0659   MCHC 33.5 02/28/2018 1128   MCHC 33.4 12/02/2015 0659   RDW 13.0 02/28/2018 1128   LYMPHSABS 0.9 02/28/2018 1128   MONOABS 0.4 11/19/2015 0957   EOSABS 0.1 02/28/2018 1128   BASOSABS 0.0 02/28/2018 1128   Iron/TIBC/Ferritin/ %Sat No results found for: IRON, TIBC, FERRITIN, IRONPCTSAT Lipid Panel     Component Value Date/Time   CHOL 177 02/28/2018 1128   TRIG 92 02/28/2018 1128   HDL 56 02/28/2018 1128   LDLCALC 103 (H) 02/28/2018 1128   Hepatic Function Panel     Component Value Date/Time   PROT 6.4 02/28/2018 1128   ALBUMIN 4.5 02/28/2018 1128   AST 17 02/28/2018 1128   ALT 17  02/28/2018 1128   ALKPHOS 119 (H) 02/28/2018 1128   BILITOT 0.6 02/28/2018 1128      Component Value Date/Time   TSH 1.300 02/28/2018 1128   Results for JENDAYA, MARCHENKO (MRN [id number]) as of 05/30/2018 14:01  Ref. Range 02/28/2018 11:28  Vitamin D, 25-Hydroxy Latest Ref Range: 30.0 - 100.0 ng/mL 14.3 (L)   I, Marianna Payment, am acting as Energy manager for [place], FNP-C.  I have reviewed the above documentation for accuracy and completeness, and I agree with the above.  -  , FNP-C.

## 2018-05-31 ENCOUNTER — Encounter (INDEPENDENT_AMBULATORY_CARE_PROVIDER_SITE_OTHER): Payer: Self-pay | Admitting: Family Medicine

## 2018-06-12 ENCOUNTER — Ambulatory Visit (INDEPENDENT_AMBULATORY_CARE_PROVIDER_SITE_OTHER): Payer: Medicare Other | Admitting: Psychology

## 2018-07-25 DIAGNOSIS — H0230 Blepharochalasis unspecified eye, unspecified eyelid: Secondary | ICD-10-CM | POA: Diagnosis not present

## 2018-11-23 DIAGNOSIS — Z23 Encounter for immunization: Secondary | ICD-10-CM | POA: Diagnosis not present

## 2018-12-13 DIAGNOSIS — F339 Major depressive disorder, recurrent, unspecified: Secondary | ICD-10-CM | POA: Diagnosis not present

## 2018-12-13 DIAGNOSIS — E538 Deficiency of other specified B group vitamins: Secondary | ICD-10-CM | POA: Diagnosis not present

## 2018-12-13 DIAGNOSIS — G4700 Insomnia, unspecified: Secondary | ICD-10-CM | POA: Diagnosis not present

## 2018-12-13 DIAGNOSIS — E559 Vitamin D deficiency, unspecified: Secondary | ICD-10-CM | POA: Diagnosis not present

## 2018-12-26 ENCOUNTER — Other Ambulatory Visit: Payer: Self-pay | Admitting: Family Medicine

## 2018-12-26 DIAGNOSIS — Z1231 Encounter for screening mammogram for malignant neoplasm of breast: Secondary | ICD-10-CM

## 2018-12-27 DIAGNOSIS — G4700 Insomnia, unspecified: Secondary | ICD-10-CM | POA: Diagnosis not present

## 2018-12-27 DIAGNOSIS — E559 Vitamin D deficiency, unspecified: Secondary | ICD-10-CM | POA: Diagnosis not present

## 2018-12-27 DIAGNOSIS — E538 Deficiency of other specified B group vitamins: Secondary | ICD-10-CM | POA: Diagnosis not present

## 2018-12-27 DIAGNOSIS — F339 Major depressive disorder, recurrent, unspecified: Secondary | ICD-10-CM | POA: Diagnosis not present

## 2018-12-28 DIAGNOSIS — Z0000 Encounter for general adult medical examination without abnormal findings: Secondary | ICD-10-CM | POA: Diagnosis not present

## 2018-12-31 ENCOUNTER — Other Ambulatory Visit: Payer: Self-pay | Admitting: Family Medicine

## 2018-12-31 DIAGNOSIS — E2839 Other primary ovarian failure: Secondary | ICD-10-CM

## 2019-03-09 ENCOUNTER — Ambulatory Visit: Payer: Medicare Other | Attending: Internal Medicine

## 2019-03-09 DIAGNOSIS — Z23 Encounter for immunization: Secondary | ICD-10-CM | POA: Insufficient documentation

## 2019-03-09 NOTE — Progress Notes (Signed)
   Covid-19 Vaccination Clinic  Name:  Paige [name]    MRN: [id number] DOB: 10-28-1945  03/09/2019  [name] was observed post Covid-19 immunization for 15 minutes without incidence. She was provided with Vaccine Information Sheet and instruction to access the V-Safe system.   [name] was instructed to call 911 with any severe reactions post vaccine: Marland Kitchen Difficulty breathing  . Swelling of your face and throat  . A fast heartbeat  . A bad rash all over your body  . Dizziness and weakness    Immunizations Administered    Name Date Dose VIS Date Route   Pfizer COVID-19 Vaccine 03/09/2019  9:21 AM 0.3 mL 01/04/2019 Intramuscular   Manufacturer: [employer], [employer]   Lot: [id number]   NDC: [address]-9

## 2019-03-27 ENCOUNTER — Ambulatory Visit
Admission: RE | Admit: 2019-03-27 | Discharge: 2019-03-27 | Disposition: A | Discharge status: Home / Self Care | Payer: Medicare Other | Source: Ambulatory Visit | Attending: Family Medicine | Admitting: Family Medicine

## 2019-03-27 ENCOUNTER — Other Ambulatory Visit: Payer: Self-pay

## 2019-03-27 DIAGNOSIS — Z1231 Encounter for screening mammogram for malignant neoplasm of breast: Secondary | ICD-10-CM

## 2019-03-27 DIAGNOSIS — E2839 Other primary ovarian failure: Secondary | ICD-10-CM

## 2019-03-27 DIAGNOSIS — M85851 Other specified disorders of bone density and structure, right thigh: Secondary | ICD-10-CM | POA: Diagnosis not present

## 2019-03-27 DIAGNOSIS — Z780 Asymptomatic menopausal state: Secondary | ICD-10-CM | POA: Diagnosis not present

## 2019-04-01 ENCOUNTER — Ambulatory Visit: Payer: Medicare Other | Attending: Internal Medicine

## 2019-04-01 DIAGNOSIS — Z23 Encounter for immunization: Secondary | ICD-10-CM | POA: Insufficient documentation

## 2019-04-01 NOTE — Progress Notes (Signed)
   Covid-19 Vaccination Clinic  Name:  Paige [name]    MRN: [id number] DOB: July 10, 1945  04/01/2019  [name] was observed post Covid-19 immunization for 15 minutes without incident. She was provided with Vaccine Information Sheet and instruction to access the V-Safe system.   [name] was instructed to call 911 with any severe reactions post vaccine: Marland Kitchen Difficulty breathing  . Swelling of face and throat  . A fast heartbeat  . A bad rash all over body  . Dizziness and weakness   Immunizations Administered    Name Date Dose VIS Date Route   Pfizer COVID-19 Vaccine 04/01/2019 11:32 AM 0.3 mL 01/04/2019 Intramuscular   Manufacturer: [employer], [employer]   Lot: [id number]   NDC: [address]-5

## 2019-06-04 DIAGNOSIS — F419 Anxiety disorder, unspecified: Secondary | ICD-10-CM | POA: Diagnosis not present

## 2019-06-04 DIAGNOSIS — E538 Deficiency of other specified B group vitamins: Secondary | ICD-10-CM | POA: Diagnosis not present

## 2019-06-04 DIAGNOSIS — I499 Cardiac arrhythmia, unspecified: Secondary | ICD-10-CM | POA: Diagnosis not present

## 2019-06-04 DIAGNOSIS — E559 Vitamin D deficiency, unspecified: Secondary | ICD-10-CM | POA: Diagnosis not present

## 2019-06-06 DIAGNOSIS — E538 Deficiency of other specified B group vitamins: Secondary | ICD-10-CM | POA: Diagnosis not present

## 2019-07-10 DIAGNOSIS — D510 Vitamin B12 deficiency anemia due to intrinsic factor deficiency: Secondary | ICD-10-CM | POA: Diagnosis not present

## 2019-08-14 DIAGNOSIS — D510 Vitamin B12 deficiency anemia due to intrinsic factor deficiency: Secondary | ICD-10-CM | POA: Diagnosis not present

## 2019-10-09 DIAGNOSIS — Z23 Encounter for immunization: Secondary | ICD-10-CM | POA: Diagnosis not present

## 2019-10-09 DIAGNOSIS — D510 Vitamin B12 deficiency anemia due to intrinsic factor deficiency: Secondary | ICD-10-CM | POA: Diagnosis not present

## 2019-11-21 DIAGNOSIS — D510 Vitamin B12 deficiency anemia due to intrinsic factor deficiency: Secondary | ICD-10-CM | POA: Diagnosis not present

## 2019-12-26 DIAGNOSIS — D510 Vitamin B12 deficiency anemia due to intrinsic factor deficiency: Secondary | ICD-10-CM | POA: Diagnosis not present

## 2020-03-04 DIAGNOSIS — Z961 Presence of intraocular lens: Secondary | ICD-10-CM | POA: Diagnosis not present

## 2020-03-04 DIAGNOSIS — H02831 Dermatochalasis of right upper eyelid: Secondary | ICD-10-CM | POA: Diagnosis not present

## 2020-03-04 DIAGNOSIS — H02834 Dermatochalasis of left upper eyelid: Secondary | ICD-10-CM | POA: Diagnosis not present

## 2020-03-04 DIAGNOSIS — H04123 Dry eye syndrome of bilateral lacrimal glands: Secondary | ICD-10-CM | POA: Diagnosis not present

## 2020-03-26 ENCOUNTER — Other Ambulatory Visit: Payer: Self-pay | Admitting: Family Medicine

## 2020-03-26 DIAGNOSIS — Z1231 Encounter for screening mammogram for malignant neoplasm of breast: Secondary | ICD-10-CM

## 2020-03-30 ENCOUNTER — Inpatient Hospital Stay: Admission: RE | Admit: 2020-03-30 | Payer: Medicare Other | Source: Ambulatory Visit

## 2020-04-15 DIAGNOSIS — N1831 Chronic kidney disease, stage 3a: Secondary | ICD-10-CM | POA: Diagnosis not present

## 2020-04-15 DIAGNOSIS — E538 Deficiency of other specified B group vitamins: Secondary | ICD-10-CM | POA: Diagnosis not present

## 2020-04-15 DIAGNOSIS — E559 Vitamin D deficiency, unspecified: Secondary | ICD-10-CM | POA: Diagnosis not present

## 2020-04-15 DIAGNOSIS — D510 Vitamin B12 deficiency anemia due to intrinsic factor deficiency: Secondary | ICD-10-CM | POA: Diagnosis not present

## 2020-05-04 ENCOUNTER — Encounter (HOSPITAL_COMMUNITY): Payer: Self-pay

## 2020-05-04 ENCOUNTER — Other Ambulatory Visit: Payer: Self-pay

## 2020-05-04 ENCOUNTER — Emergency Department (HOSPITAL_COMMUNITY): Payer: Medicare Other

## 2020-05-04 ENCOUNTER — Inpatient Hospital Stay (HOSPITAL_COMMUNITY)
Admission: EM | Admit: 2020-05-04 | Discharge: 2020-05-08 | DRG: 286 | Disposition: A | Discharge status: Home / Self Care | Payer: Medicare Other | Attending: Internal Medicine | Admitting: Internal Medicine

## 2020-05-04 DIAGNOSIS — M1710 Unilateral primary osteoarthritis, unspecified knee: Secondary | ICD-10-CM

## 2020-05-04 DIAGNOSIS — I517 Cardiomegaly: Secondary | ICD-10-CM | POA: Diagnosis not present

## 2020-05-04 DIAGNOSIS — Z66 Do not resuscitate: Secondary | ICD-10-CM | POA: Diagnosis present

## 2020-05-04 DIAGNOSIS — Z79899 Other long term (current) drug therapy: Secondary | ICD-10-CM

## 2020-05-04 DIAGNOSIS — I10 Essential (primary) hypertension: Secondary | ICD-10-CM | POA: Diagnosis not present

## 2020-05-04 DIAGNOSIS — K219 Gastro-esophageal reflux disease without esophagitis: Secondary | ICD-10-CM | POA: Diagnosis present

## 2020-05-04 DIAGNOSIS — E785 Hyperlipidemia, unspecified: Secondary | ICD-10-CM | POA: Diagnosis present

## 2020-05-04 DIAGNOSIS — Z20822 Contact with and (suspected) exposure to covid-19: Secondary | ICD-10-CM | POA: Diagnosis not present

## 2020-05-04 DIAGNOSIS — R0689 Other abnormalities of breathing: Secondary | ICD-10-CM | POA: Diagnosis not present

## 2020-05-04 DIAGNOSIS — Z6841 Body Mass Index (BMI) 40.0 and over, adult: Secondary | ICD-10-CM | POA: Diagnosis not present

## 2020-05-04 DIAGNOSIS — R7989 Other specified abnormal findings of blood chemistry: Secondary | ICD-10-CM | POA: Diagnosis present

## 2020-05-04 DIAGNOSIS — R600 Localized edema: Secondary | ICD-10-CM | POA: Diagnosis not present

## 2020-05-04 DIAGNOSIS — Z90710 Acquired absence of both cervix and uterus: Secondary | ICD-10-CM | POA: Diagnosis not present

## 2020-05-04 DIAGNOSIS — Z96653 Presence of artificial knee joint, bilateral: Secondary | ICD-10-CM | POA: Diagnosis present

## 2020-05-04 DIAGNOSIS — E66813 Obesity, class 3: Secondary | ICD-10-CM

## 2020-05-04 DIAGNOSIS — G2581 Restless legs syndrome: Secondary | ICD-10-CM | POA: Diagnosis present

## 2020-05-04 DIAGNOSIS — N1831 Chronic kidney disease, stage 3a: Secondary | ICD-10-CM | POA: Diagnosis not present

## 2020-05-04 DIAGNOSIS — I130 Hypertensive heart and chronic kidney disease with heart failure and stage 1 through stage 4 chronic kidney disease, or unspecified chronic kidney disease: Principal | ICD-10-CM | POA: Diagnosis present

## 2020-05-04 DIAGNOSIS — E876 Hypokalemia: Secondary | ICD-10-CM | POA: Diagnosis present

## 2020-05-04 DIAGNOSIS — R062 Wheezing: Secondary | ICD-10-CM | POA: Diagnosis not present

## 2020-05-04 DIAGNOSIS — I509 Heart failure, unspecified: Secondary | ICD-10-CM

## 2020-05-04 DIAGNOSIS — I110 Hypertensive heart disease with heart failure: Secondary | ICD-10-CM | POA: Diagnosis not present

## 2020-05-04 DIAGNOSIS — R0602 Shortness of breath: Secondary | ICD-10-CM | POA: Diagnosis not present

## 2020-05-04 DIAGNOSIS — F419 Anxiety disorder, unspecified: Secondary | ICD-10-CM | POA: Diagnosis present

## 2020-05-04 DIAGNOSIS — Z96642 Presence of left artificial hip joint: Secondary | ICD-10-CM | POA: Diagnosis not present

## 2020-05-04 DIAGNOSIS — E7849 Other hyperlipidemia: Secondary | ICD-10-CM | POA: Diagnosis not present

## 2020-05-04 DIAGNOSIS — E538 Deficiency of other specified B group vitamins: Secondary | ICD-10-CM | POA: Diagnosis present

## 2020-05-04 DIAGNOSIS — I420 Dilated cardiomyopathy: Secondary | ICD-10-CM | POA: Diagnosis present

## 2020-05-04 DIAGNOSIS — I5021 Acute systolic (congestive) heart failure: Secondary | ICD-10-CM | POA: Diagnosis not present

## 2020-05-04 DIAGNOSIS — I890 Lymphedema, not elsewhere classified: Secondary | ICD-10-CM | POA: Diagnosis not present

## 2020-05-04 DIAGNOSIS — R059 Cough, unspecified: Secondary | ICD-10-CM | POA: Diagnosis not present

## 2020-05-04 DIAGNOSIS — J9601 Acute respiratory failure with hypoxia: Secondary | ICD-10-CM | POA: Diagnosis present

## 2020-05-04 DIAGNOSIS — R000 Tachycardia, unspecified: Secondary | ICD-10-CM | POA: Diagnosis not present

## 2020-05-04 DIAGNOSIS — R0600 Dyspnea, unspecified: Secondary | ICD-10-CM | POA: Diagnosis not present

## 2020-05-04 DIAGNOSIS — E7800 Pure hypercholesterolemia, unspecified: Secondary | ICD-10-CM | POA: Diagnosis not present

## 2020-05-04 DIAGNOSIS — R778 Other specified abnormalities of plasma proteins: Secondary | ICD-10-CM | POA: Diagnosis present

## 2020-05-04 DIAGNOSIS — I5041 Acute combined systolic (congestive) and diastolic (congestive) heart failure: Secondary | ICD-10-CM | POA: Diagnosis present

## 2020-05-04 DIAGNOSIS — I5031 Acute diastolic (congestive) heart failure: Secondary | ICD-10-CM | POA: Diagnosis not present

## 2020-05-04 HISTORY — DX: Heart failure, unspecified: I50.9

## 2020-05-04 HISTORY — DX: Pure hypercholesterolemia, unspecified: E78.00

## 2020-05-04 HISTORY — DX: Gastro-esophageal reflux disease without esophagitis: K21.9

## 2020-05-04 HISTORY — DX: Anxiety disorder, unspecified: F41.9

## 2020-05-04 HISTORY — DX: Chronic kidney disease, stage 3a: N18.31

## 2020-05-04 LAB — I-STAT VENOUS BLOOD GAS, ED
Acid-Base Excess: 3 mmol/L — ABNORMAL HIGH (ref 0.0–2.0)
Bicarbonate: 30.2 mmol/L — ABNORMAL HIGH (ref 20.0–28.0)
Calcium, Ion: 1.19 mmol/L (ref 1.15–1.40)
HCT: 46 % (ref 36.0–46.0)
Hemoglobin: 15.6 g/dL — ABNORMAL HIGH (ref 12.0–15.0)
O2 Saturation: 96 %
Potassium: 4.5 mmol/L (ref 3.5–5.1)
Sodium: 141 mmol/L (ref 135–145)
TCO2: 32 mmol/L (ref 22–32)
pCO2, Ven: 54.1 mmHg (ref 44.0–60.0)
pH, Ven: 7.355 (ref 7.250–7.430)
pO2, Ven: 85 mmHg — ABNORMAL HIGH (ref 32.0–45.0)

## 2020-05-04 LAB — CBC WITH DIFFERENTIAL/PLATELET
Abs Immature Granulocytes: 0.02 10*3/uL (ref 0.00–0.07)
Basophils Absolute: 0 10*3/uL (ref 0.0–0.1)
Basophils Relative: 1 %
Eosinophils Absolute: 0.2 10*3/uL (ref 0.0–0.5)
Eosinophils Relative: 2 %
HCT: 47.2 % — ABNORMAL HIGH (ref 36.0–46.0)
Hemoglobin: 15.2 g/dL — ABNORMAL HIGH (ref 12.0–15.0)
Immature Granulocytes: 0 %
Lymphocytes Relative: 12 %
Lymphs Abs: 0.9 10*3/uL (ref 0.7–4.0)
MCH: 30.8 pg (ref 26.0–34.0)
MCHC: 32.2 g/dL (ref 30.0–36.0)
MCV: 95.7 fL (ref 80.0–100.0)
Monocytes Absolute: 0.7 10*3/uL (ref 0.1–1.0)
Monocytes Relative: 8 %
Neutro Abs: 6.2 10*3/uL (ref 1.7–7.7)
Neutrophils Relative %: 77 %
Platelets: 225 10*3/uL (ref 150–400)
RBC: 4.93 MIL/uL (ref 3.87–5.11)
RDW: 14.2 % (ref 11.5–15.5)
WBC: 8 10*3/uL (ref 4.0–10.5)
nRBC: 0 % (ref 0.0–0.2)

## 2020-05-04 LAB — BASIC METABOLIC PANEL
Anion gap: 7 (ref 5–15)
BUN: 19 mg/dL (ref 8–23)
CO2: 29 mmol/L (ref 22–32)
Calcium: 9.1 mg/dL (ref 8.9–10.3)
Chloride: 103 mmol/L (ref 98–111)
Creatinine, Ser: 0.93 mg/dL (ref 0.44–1.00)
GFR, Estimated: >60 mL/min (ref >60)
Glucose, Bld: 99 mg/dL (ref 70–99)
Potassium: 4.3 mmol/L (ref 3.5–5.1)
Sodium: 139 mmol/L (ref 135–145)

## 2020-05-04 LAB — RESP PANEL BY RT-PCR (FLU A&B, COVID) ARPGX2
Influenza A by PCR: NEGATIVE
Influenza B by PCR: NEGATIVE
SARS Coronavirus 2 by RT PCR: NEGATIVE

## 2020-05-04 LAB — BRAIN NATRIURETIC PEPTIDE: B Natriuretic Peptide: 256 pg/mL — ABNORMAL HIGH (ref 0.0–100.0)

## 2020-05-04 MED ORDER — SODIUM CHLORIDE 0.9 % IV SOLN: 250.0000 mL | INTRAVENOUS | Status: DC | PRN

## 2020-05-04 MED ORDER — FUROSEMIDE 10 MG/ML IJ SOLN
40.0000 mg | Freq: Once | INTRAMUSCULAR | Status: AC
  Administered 2020-05-04: 40 mg via INTRAVENOUS
  Filled 2020-05-04: qty 4

## 2020-05-04 MED ORDER — ENOXAPARIN SODIUM 40 MG/0.4ML ~~LOC~~ SOLN
40.0000 mg | Freq: Every day | SUBCUTANEOUS | Status: DC
  Administered 2020-05-05 – 2020-05-08 (×3): 40 mg via SUBCUTANEOUS
  Filled 2020-05-04 (×4): qty 0.4

## 2020-05-04 MED ORDER — SODIUM CHLORIDE 0.9% FLUSH: 3.0000 mL | INTRAVENOUS | Status: DC | PRN

## 2020-05-04 MED ORDER — ACETAMINOPHEN 325 MG PO TABS: 650.0000 mg | ORAL_TABLET | ORAL | Status: DC | PRN

## 2020-05-04 MED ORDER — ONDANSETRON HCL 4 MG/2ML IJ SOLN: 4.0000 mg | Freq: Four times a day (QID) | INTRAMUSCULAR | Status: DC | PRN

## 2020-05-04 MED ORDER — FUROSEMIDE 10 MG/ML IJ SOLN
40.0000 mg | Freq: Two times a day (BID) | INTRAMUSCULAR | Status: DC
  Administered 2020-05-05 – 2020-05-06 (×3): 40 mg via INTRAVENOUS
  Filled 2020-05-04 (×3): qty 4

## 2020-05-04 MED ORDER — SODIUM CHLORIDE 0.9% FLUSH
3.0000 mL | Freq: Two times a day (BID) | INTRAVENOUS | Status: DC
  Administered 2020-05-05 – 2020-05-07 (×6): 3 mL via INTRAVENOUS

## 2020-05-04 NOTE — ED Notes (Signed)
Called lab to have BNP added on to previous collection.

## 2020-05-04 NOTE — ED Triage Notes (Addendum)
Pt arrived to ED via EMS from home w/ c/o [facility] that has progressively worsened over the last couple of weeks. Initially pt was experiencing DOE which progressed to moderate dyspnea at rest. Upon EMS arrival to scene, Fire had placed pt on NRB at 15L. EMS noted that pt ws 97% on NRB and placed pt on 4L via [place] and pt was sating 99%.  EMS VS: 156/112, HR 110 ST w/ frequent PVC's, RR 20 en route. EMS IV: 20g L FA.  Audible expiratory wheezing noted upon EMS arrival to ED. Pt breathing labored and tachypneic.

## 2020-05-04 NOTE — ED Provider Notes (Signed)
[facility] EMERGENCY DEPARTMENT Provider Note   CSN: [id number] Arrival date & time: 05/04/20  1636     History Chief Complaint  Patient presents with  . Shortness of Breath    Paige [name] is a 75 y.o. female.  HPI She presents, from home, for evaluation of worsening shortness of breath.  She began feeling short of breath about a month ago.  It worsened and became constant 3 to 4 days ago.  No history of lung disease or use of medications to treat respiratory trouble.  She denies chest pain, headache, weakness, dizziness, extremity pain, trouble walking.  She was treated at the scene by EMS today, and transferred here.  Room air oxygen saturation was not reported.  There are no other known active modifying factors.    Past Medical History:  Diagnosis Date  . Anemia   . Anxiety   . Arthritis   . Back pain   . Depression   . Fatigue   . Gallbladder problem   . GERD (gastroesophageal reflux disease)   . H/O hiatal hernia   . Hypertension    not being treated  . Joint pain   . Lymphedema of both lower extremities   . Obesity   . Osteoarthritis   . PONV (postoperative nausea and vomiting)    after Cholecysectomy  . Restless legs syndrome   . Vertigo   . Vitamin B 12 deficiency     Patient Active Problem List   Diagnosis Date Noted  . Depression 04/26/2018  . Vitamin D deficiency 03/29/2018  . Insulin resistance 03/29/2018  . Class 3 severe obesity with serious comorbidity and body mass index (BMI) of 45.0 to 49.9 in adult (HCC) 03/29/2018  . Primary osteoarthritis of left hip 11/26/2015  . Arthritis of right knee 02/10/2014  . Primary osteoarthritis of right knee 02/09/2014  . Arthritis of knee, left 09/25/2013  . Arthritis of knee 09/25/2013    Past Surgical History:  Procedure Laterality Date  . ABDOMINAL HYSTERECTOMY    . CHOLECYSTECTOMY    . COLONOSCOPY    . EYE SURGERY Bilateral    cataracts  . FRACTURE SURGERY Right    arm age  51  . FRACTURE SURGERY Bilateral    both thumbs  . LAPAROSCOPIC GASTRIC BANDING     2010  . OVARIAN CYST REMOVAL    . TOTAL HIP ARTHROPLASTY Left 11/30/2015   Procedure: TOTAL HIP ARTHROPLASTY ANTERIOR APPROACH;  Surgeon: Gean Birchwood, MD;  Location: [facility] OR;  Service: Orthopedics;  Laterality: Left;  . TOTAL KNEE ARTHROPLASTY Left 09/25/2013   Procedure: TOTAL KNEE ARTHROPLASTY;  Surgeon: Nestor Lewandowsky, MD;  Location: [facility] OR;  Service: Orthopedics;  Laterality: Left;  . TOTAL KNEE ARTHROPLASTY Right 02/10/2014   dr Turner Daniels  . TOTAL KNEE ARTHROPLASTY Right 02/10/2014   Procedure: TOTAL KNEE ARTHROPLASTY;  Surgeon: Nestor Lewandowsky, MD;  Location: [facility] OR;  Service: Orthopedics;  Laterality: Right;     OB History    Gravida  0   Para  0   Term  0   Preterm  0   AB  0   Living  0     SAB  0   IAB  0   Ectopic  0   Multiple  0   Live Births  0           Family History  Problem Relation Age of Onset  . Hypertension Mother   . Depression Mother  Social History   Tobacco Use  . Smoking status: Never Smoker  . Smokeless tobacco: Never Used  Substance Use Topics  . Alcohol use: Yes    Comment: rarely  . Drug use: No    Home Medications Prior to Admission medications   Medication Sig Start Date End Date Taking? Authorizing Provider  buPROPion (WELLBUTRIN XL) 150 MG 24 hr tablet Take 150 mg by mouth daily. 09/04/15   [provider]  PARoxetine (PAXIL) 40 MG tablet Take 40 mg by mouth every morning.      [provider]  pramipexole (MIRAPEX) 0.25 MG tablet Take 0.25 mg by mouth 2 (two) times daily.     [provider]  traZODone (DESYREL) 150 MG tablet Take 150 mg by mouth at bedtime. 11/27/14   [provider]  Vitamin D, Ergocalciferol, (DRISDOL) 1.25 MG (50000 UT) CAPS capsule Take 1 capsule (50,000 Units total) by mouth every 7 (seven) days. 05/30/18   [place], Thermon Leyland, FNP    Allergies    No known allergies  Review of Systems    Review of Systems  All other systems reviewed and are negative.   Physical Exam Updated Vital Signs BP 137/86   Pulse 97   Temp 99.2 F (37.3 C) (Oral)   Resp (!) 27   Ht 5\' 7"  (1.702 m)   Wt (!) 147.4 kg   SpO2 100%   BMI 50.90 kg/m   Physical Exam Vitals and nursing note reviewed.  Constitutional:      General: She is not in acute distress.    Appearance: She is well-developed. She is obese. She is not ill-appearing, toxic-appearing or diaphoretic.  HENT:     Head: Normocephalic and atraumatic.     Right Ear: External ear normal.     Left Ear: External ear normal.  Eyes:     Conjunctiva/sclera: Conjunctivae normal.     Pupils: Pupils are equal, round, and reactive to light.  Neck:     Trachea: Phonation normal.  Cardiovascular:     Rate and Rhythm: Normal rate and regular rhythm.     Heart sounds: Normal heart sounds.  Pulmonary:     Effort: Pulmonary effort is normal. No respiratory distress.     Breath sounds: No stridor. Wheezing present. No rhonchi.     Comments: Decreased air movement bilaterally with end expiratory wheezing. Chest:     Chest wall: No tenderness.  Abdominal:     Palpations: Abdomen is soft.     Tenderness: There is no abdominal tenderness.  Musculoskeletal:        General: No tenderness. Normal range of motion.     Cervical back: Normal range of motion and neck supple.     Right lower leg: No edema.     Left lower leg: No edema.  Skin:    General: Skin is warm and dry.  Neurological:     Mental Status: She is alert and oriented to person, place, and time.     Cranial Nerves: No cranial nerve deficit.     Sensory: No sensory deficit.     Motor: No abnormal muscle tone.     Coordination: Coordination normal.  Psychiatric:        Mood and Affect: Mood normal.        Behavior: Behavior normal.        Thought Content: Thought content normal.        Judgment: Judgment normal.     ED Results / Procedures /  Treatments   Labs (all  labs ordered are listed, but only abnormal results are displayed) Labs Reviewed  CBC WITH DIFFERENTIAL/PLATELET - Abnormal; Notable for the following components:      Result Value   Hemoglobin 15.2 (*)    HCT 47.2 (*)    All other components within normal limits  I-STAT VENOUS BLOOD GAS, ED - Abnormal; Notable for the following components:   pO2, Ven 85.0 (*)    Bicarbonate 30.2 (*)    Acid-Base Excess 3.0 (*)    Hemoglobin 15.6 (*)    All other components within normal limits  RESP PANEL BY RT-PCR (FLU A&B, COVID) ARPGX2  BASIC METABOLIC PANEL  BLOOD GAS, VENOUS  BRAIN NATRIURETIC PEPTIDE    EKG EKG Interpretation  Date/Time:  Monday May 04 2020 16:43:37 EDT Ventricular Rate:  120 PR Interval:  178 QRS Duration: 101 QT Interval:  315 QTC Calculation: 445 R Axis:   19 Text Interpretation: Sinus tachycardia Multiple ventricular premature complexes Nonspecific T abnormalities, lateral leads Since last tracing rate faster and PVC are new Otherwise no significant change Confirmed by Mancel Bale [phone]) on 05/04/2020 5:29:49 PM   Radiology DG Chest Port 1 View  Result Date: 05/04/2020 CLINICAL DATA:  Dyspnea, shortness of breath EXAM: PORTABLE CHEST 1 VIEW COMPARISON:  11/19/2015 FINDINGS: Cardiomegaly. Mild, diffuse bilateral interstitial pulmonary opacity. Possible layering small pleural effusions. The visualized skeletal structures are unremarkable. IMPRESSION: Cardiomegaly with mild, diffuse bilateral interstitial pulmonary opacity and possible layering small pleural effusions. Findings are most consistent with edema. Electronically Signed   By: Lauralyn Primes M.D.   On: 05/04/2020 17:59    Procedures .Critical Care Performed by: Mancel Bale, MD Authorized by: Mancel Bale, MD   Critical care provider statement:    Critical care time (minutes):  35   Critical care start time:  05/04/2020 4:55 PM   Critical care end time:  05/04/2020 10:10 PM   Critical care time was  exclusive of:  Separately billable procedures and treating other patients   Critical care was necessary to treat or prevent imminent or life-threatening deterioration of the following conditions:  Cardiac failure   Critical care was time spent personally by me on the following activities:  Blood draw for specimens, development of treatment plan with patient or surrogate, discussions with consultants, evaluation of patient's response to treatment, examination of patient, obtaining history from patient or surrogate, ordering and performing treatments and interventions, ordering and review of laboratory studies, pulse oximetry, re-evaluation of patient's condition, review of old charts and ordering and review of radiographic studies     Medications Ordered in ED Medications  furosemide (LASIX) injection 40 mg (40 mg Intravenous Given 05/04/20 2132)    ED Course  I have reviewed the triage vital signs and the nursing notes.  Pertinent labs & imaging results that were available during my care of the patient were reviewed by me and considered in my medical decision making (see chart for details).    MDM Rules/Calculators/A&P                           Patient Vitals for the past 24 hrs:  BP Temp Temp src Pulse Resp SpO2 Height Weight  05/04/20 2130 137/86 -- -- 97 (!) 27 100 % -- --  05/04/20 2115 (!) 128/106 -- -- 98 18 100 % -- --  05/04/20 2100 (!) 151/82 -- -- (!) 105 15 100 % -- --  05/04/20 2045 Marland Kitchen)  147/87 -- -- 100 (!) 22 100 % -- --  05/04/20 2030 (!) 159/87 -- -- 97 (!) 25 100 % -- --  05/04/20 2015 (!) 148/86 -- -- 99 (!) 23 100 % -- --  05/04/20 2000 (!) 139/94 -- -- (!) 102 19 100 % -- --  05/04/20 1915 (!) 179/93 -- -- (!) 101 20 100 % -- --  05/04/20 1845 (!) 160/107 -- -- -- (!) 24 -- -- --  05/04/20 1830 (!) 135/97 -- -- -- (!) 22 -- -- --  05/04/20 1745 (!) 157/111 -- -- (!) 102 (!) 26 99 % -- --  05/04/20 1730 140/84 -- -- (!) 53 (!) 26 99 % -- --  05/04/20 1715 (!)  184/136 -- -- (!) 55 18 99 % -- --  05/04/20 1708 -- -- -- -- -- 97 % -- --  05/04/20 1705 -- -- -- -- -- 95 % -- --  05/04/20 1700 (!) 171/110 -- -- (!) 55 (!) 29 100 % -- --  05/04/20 1653 -- -- -- -- -- -- 5\' 7"  (1.702 m) (!) 147.4 kg  05/04/20 1652 (!) 187/123 99.2 F (37.3 C) Oral (!) 112 (!) 25 100 % -- --  05/04/20 1645 -- -- -- -- -- 99 % -- --    9:44 PM Reevaluation with update and discussion. After initial assessment and treatment, an updated evaluation reveals she feels more comfortable on oxygen, plan discussed and questions answered. 07/04/20   Medical Decision Making:  This patient is presenting for evaluation of shortness of breath for 1 month, which does require a range of treatment options, and is a complaint that involves a high risk of morbidity and mortality. The differential diagnoses include CHF, bronchitis, pneumonia, COPD. I decided to review old records, and in summary elderly obese female presenting with ongoing shortness of breath for several weeks, no history of COPD or congestive heart failure.  I did not require additional historical information from Anyone.  Clinical Laboratory Tests Ordered, included CBC, Metabolic panel and Viral panel, venous blood gas. Review indicates normal. Radiologic Tests Ordered, included chest x-ray.  I independently Visualized: Radiograph images, which show pulmonary edema with cardiomegaly  Cardiac Monitor Tracing which shows sinus tachycardia    Critical Interventions-clinical evaluation, laboratory testing, radiography, IV Lasix, observation and reassessment  After These Interventions, the Patient was reevaluated and was found with dyspnea secondary to likely pulmonary edema, new onset congestive heart failure.  Doubt ACS.  Incidental PVCs on EKG without worrisome near syncopal or syncopal episodes.  She will require hospitalization for diuresis, cardiac echo, and possibly cardiology consultation.  CRITICAL  CARE-yes Performed by: Mancel Bale  Nursing Notes Reviewed/ Care Coordinated Applicable Imaging Reviewed Interpretation of Laboratory Data incorporated into ED treatment  10:11 PM-Consult complete with hospitalist. Patient case explained and discussed.  He agrees to admit patient for further evaluation and treatment. Call ended at 10:05 PM    Final Clinical Impression(s) / ED Diagnoses Final diagnoses:  Acute congestive heart failure, unspecified heart failure type [facility])    Rx / DC Orders ED Discharge Orders    None       [facility], MD 05/04/20 2211

## 2020-05-04 NOTE — H&P (Signed)
History and Physical   KAMEE BOBST RUE:[id number] DOB: [birth year] DOA: 05/04/2020  Referring MD/NP/PA: Dr. Effie Shy  PCP: [name], MD   Outpatient Specialists: None  Patient coming from: Home  Chief Complaint: Shortness of breath  HPI: Paige [name] is a 75 y.o. female with medical history significant of osteoarthritis, hyperlipidemia, GERD, depression, chronic lymphedema of the lower extremity, restless leg syndrome and vitamin B12 deficiency who presents to the ER after being brought in by EMS with significant shortness of breath and hypoxia.  Patient reported progressive worsening of the shortness of breath over the last few weeks but more importantly in the last 2 days.  She was initially having dyspnea on exertion and now even at rest.  When EMS arrived she was placed on nonrebreather back at 15 L.  Oxygen sat on originally was said to be in the 80s.  She is currently doing well on 4 L nasal cannula.  She has sinus tachycardia and evidence of tachypnea.  Here in the ER patient was noted to have bilateral lymphedema.  She was wheezing audibly when she arrived here received a dose of Lasix and chest x-ray showed pulmonary edema and some trace pleural effusion.  She has no prior history of cardiac disease.  She is morbidly obese.  At this point suspicion is for new onset congestive heart failure probably diastolic in nature.  No chest pain no hemoptysis..  ED Course: Temperature 99.2 blood pressure 187/123 pulse 1 torso 29 oxygen sat 95% on room air.  CBC and chemistry largely within normal except hemoglobin of 15.6.  BNP 256.  Influenza and COVID-19 screen negative.  Chest x-ray showed bilateral pulmonary edema with cardiomegaly and possible trace pleural effusions.  Patient being admitted for evaluation and treatment of CHF exacerbation.  Review of Systems: As per HPI otherwise 10 point review of systems negative.    Past Medical History:  Diagnosis Date  . Anemia   .  Anxiety   . Arthritis   . Back pain   . Depression   . Fatigue   . Gallbladder problem   . GERD (gastroesophageal reflux disease)   . H/O hiatal hernia   . Hypertension    not being treated  . Joint pain   . Lymphedema of both lower extremities   . Obesity   . Osteoarthritis   . PONV (postoperative nausea and vomiting)    after Cholecysectomy  . Restless legs syndrome   . Vertigo   . Vitamin B 12 deficiency     Past Surgical History:  Procedure Laterality Date  . ABDOMINAL HYSTERECTOMY    . CHOLECYSTECTOMY    . COLONOSCOPY    . EYE SURGERY Bilateral    cataracts  . FRACTURE SURGERY Right    arm age 77  . FRACTURE SURGERY Bilateral    both thumbs  . LAPAROSCOPIC GASTRIC BANDING     2010  . OVARIAN CYST REMOVAL    . TOTAL HIP ARTHROPLASTY Left 11/30/2015   Procedure: TOTAL HIP ARTHROPLASTY ANTERIOR APPROACH;  Surgeon: Gean Birchwood, MD;  Location: [facility] OR;  Service: Orthopedics;  Laterality: Left;  . TOTAL KNEE ARTHROPLASTY Left 09/25/2013   Procedure: TOTAL KNEE ARTHROPLASTY;  Surgeon: Nestor Lewandowsky, MD;  Location: [facility] OR;  Service: Orthopedics;  Laterality: Left;  . TOTAL KNEE ARTHROPLASTY Right 02/10/2014   dr Turner Daniels  . TOTAL KNEE ARTHROPLASTY Right 02/10/2014   Procedure: TOTAL KNEE ARTHROPLASTY;  Surgeon: Nestor Lewandowsky, MD;  Location: [facility] OR;  Service: Orthopedics;  Laterality: Right;     reports that she has never smoked. She has never used smokeless tobacco. She reports current alcohol use. She reports that she does not use drugs.  Allergies  Allergen Reactions  . No Known Allergies     Family History  Problem Relation Age of Onset  . Hypertension Mother   . Depression Mother      Prior to Admission medications   Medication Sig Start Date End Date Taking? Authorizing Provider  buPROPion (WELLBUTRIN XL) 150 MG 24 hr tablet Take 150 mg by mouth daily. 09/04/15   [provider]  PARoxetine (PAXIL) 40 MG tablet Take 40 mg by mouth every morning.       [provider]  pramipexole (MIRAPEX) 0.25 MG tablet Take 0.25 mg by mouth 2 (two) times daily.     [provider]  traZODone (DESYREL) 150 MG tablet Take 150 mg by mouth at bedtime. 11/27/14   [provider]  Vitamin D, Ergocalciferol, (DRISDOL) 1.25 MG (50000 UT) CAPS capsule Take 1 capsule (50,000 Units total) by mouth every 7 (seven) days. 05/30/18   [place], Thermon Leyland, FNP    Physical Exam: Vitals:   05/04/20 2130 05/04/20 2145 05/04/20 2200 05/04/20 2215  BP: 137/86 (!) 144/86 139/70 (!) 147/82  Pulse: 97 97 95 92  Resp: (!) 27 (!) 25 19 (!) 22  Temp:      TempSrc:      SpO2: 100% 100% 98% 100%  Weight:      Height:          Constitutional: Morbidly obese, in no distress Vitals:   05/04/20 2130 05/04/20 2145 05/04/20 2200 05/04/20 2215  BP: 137/86 (!) 144/86 139/70 (!) 147/82  Pulse: 97 97 95 92  Resp: (!) 27 (!) 25 19 (!) 22  Temp:      TempSrc:      SpO2: 100% 100% 98% 100%  Weight:      Height:       Eyes: PERRL, lids and conjunctivae normal ENMT: Mucous membranes are moist. Posterior pharynx clear of any exudate or lesions.Normal dentition.  Neck: normal, supple, no masses, no thyromegaly Respiratory: Decreased air entry bilaterally with mild expiratory wheezing and basal crackles. Normal respiratory effort. No accessory muscle use.  Cardiovascular: Regular rate and rhythm, no murmurs / rubs / gallops.  2+ extremity edema. 2+ pedal pulses. No carotid bruits.  Abdomen: no tenderness, no masses palpated. No hepatosplenomegaly. Bowel sounds positive.  Musculoskeletal: no clubbing / cyanosis. No joint deformity upper and lower extremities. Good ROM, no contractures. Normal muscle tone.  Skin: no rashes, lesions, ulcers. No induration Neurologic: CN 2-12 grossly intact. Sensation intact, DTR normal. Strength 5/5 in all 4.  Psychiatric: Normal judgment and insight. Alert and oriented x 3. Normal mood.     Labs on Admission: I have personally  reviewed following labs and imaging studies  CBC: Recent Labs  Lab 05/04/20 1714 05/04/20 1723  WBC 8.0  --   NEUTROABS 6.2  --   HGB 15.2* 15.6*  HCT 47.2* 46.0  MCV 95.7  --   PLT 225  --    Basic Metabolic Panel: Recent Labs  Lab 05/04/20 1714 05/04/20 1723  NA 139 141  K 4.3 4.5  CL 103  --   CO2 29  --   GLUCOSE 99  --   BUN 19  --   CREATININE 0.93  --   CALCIUM 9.1  --    GFR:  Estimated Creatinine Clearance: 80.3 mL/min (by C-G formula based on SCr of 0.93 mg/dL). Liver Function Tests: No results for input(s): AST, ALT, ALKPHOS, BILITOT, PROT, ALBUMIN in the last 168 hours. No results for input(s): LIPASE, AMYLASE in the last 168 hours. No results for input(s): AMMONIA in the last 168 hours. Coagulation Profile: No results for input(s): INR, PROTIME in the last 168 hours. Cardiac Enzymes: No results for input(s): CKTOTAL, CKMB, CKMBINDEX, TROPONINI in the last 168 hours. BNP (last 3 results) No results for input(s): PROBNP in the last 8760 hours. HbA1C: No results for input(s): HGBA1C in the last 72 hours. CBG: No results for input(s): GLUCAP in the last 168 hours. Lipid Profile: No results for input(s): CHOL, HDL, LDLCALC, TRIG, CHOLHDL, LDLDIRECT in the last 72 hours. Thyroid Function Tests: No results for input(s): TSH, T4TOTAL, FREET4, T3FREE, THYROIDAB in the last 72 hours. Anemia Panel: No results for input(s): VITAMINB12, FOLATE, FERRITIN, TIBC, IRON, RETICCTPCT in the last 72 hours. Urine analysis:    Component Value Date/Time   COLORURINE YELLOW 11/19/2015 0943   APPEARANCEUR CLEAR 11/19/2015 0943   LABSPEC 1.018 11/19/2015 0943   PHURINE 7.5 11/19/2015 0943   GLUCOSEU NEGATIVE 11/19/2015 0943   HGBUR NEGATIVE 11/19/2015 0943   BILIRUBINUR NEGATIVE 11/19/2015 0943   KETONESUR NEGATIVE 11/19/2015 0943   PROTEINUR NEGATIVE 11/19/2015 0943   UROBILINOGEN 1.0 01/31/2014 1414   NITRITE NEGATIVE 11/19/2015 0943   LEUKOCYTESUR NEGATIVE  11/19/2015 0943   Sepsis Labs: @LABRCNTIP (procalcitonin:4,lacticidven:4) ) Recent Results (from the past 240 hour(s))  Resp Panel by RT-PCR (Flu A&B, Covid) Nasopharyngeal Swab     Status: None   Collection Time: 05/04/20  5:14 PM   Specimen: Nasopharyngeal Swab; Nasopharyngeal(NP) swabs in vial transport medium  Result Value Ref Range Status   SARS Coronavirus 2 by RT PCR NEGATIVE NEGATIVE Final    Comment: (NOTE) SARS-CoV-2 target nucleic acids are NOT DETECTED.  The SARS-CoV-2 RNA is generally detectable in upper respiratory specimens during the acute phase of infection. The lowest concentration of SARS-CoV-2 viral copies this assay can detect is 138 copies/mL. A negative result does not preclude SARS-Cov-2 infection and should not be used as the sole basis for treatment or other patient management decisions. A negative result may occur with  improper specimen collection/handling, submission of specimen other than nasopharyngeal swab, presence of viral mutation(s) within the areas targeted by this assay, and inadequate number of viral copies(<138 copies/mL). A negative result must be combined with clinical observations, patient history, and epidemiological information. The expected result is Negative.  Fact Sheet for Patients:  BloggerCourse.comhttps://www.fda.gov/media/152166/download  Fact Sheet for Healthcare Providers:  SeriousBroker.ithttps://www.fda.gov/media/152162/download  This test is no t yet approved or cleared by the [place] FDA and  has been authorized for detection and/or diagnosis of SARS-CoV-2 by FDA under an Emergency Use Authorization (EUA). This EUA will remain  in effect (meaning this test can be used) for the duration of the COVID-19 declaration under Section 564(b)(1) of the Act, 21 U.S.C.section 360bbb-3(b)(1), unless the authorization is terminated  or revoked sooner.       Influenza A by PCR NEGATIVE NEGATIVE Final   Influenza B by PCR NEGATIVE NEGATIVE Final     Comment: (NOTE) The Xpert Xpress SARS-CoV-2/FLU/RSV plus assay is intended as an aid in the diagnosis of influenza from Nasopharyngeal swab specimens and should not be used as a sole basis for treatment. Nasal washings and aspirates are unacceptable for Xpert Xpress SARS-CoV-2/FLU/RSV testing.  Fact Sheet for Patients: BloggerCourse.comhttps://www.fda.gov/media/152166/download  Fact  Sheet for Healthcare Providers: SeriousBroker.it  This test is not yet approved or cleared by the [place] and has been authorized for detection and/or diagnosis of SARS-CoV-2 by FDA under an Emergency Use Authorization (EUA). This EUA will remain in effect (meaning this test can be used) for the duration of the COVID-19 declaration under Section 564(b)(1) of the Act, 21 U.S.C. section 360bbb-3(b)(1), unless the authorization is terminated or revoked.  Performed at [facility], [address], [place], [place] [address]      Radiological Exams on Admission: DG Chest Port 1 View  Result Date: 05/04/2020 CLINICAL DATA:  Dyspnea, shortness of breath EXAM: PORTABLE CHEST 1 VIEW COMPARISON:  11/19/2015 FINDINGS: Cardiomegaly. Mild, diffuse bilateral interstitial pulmonary opacity. Possible layering small pleural effusions. The visualized skeletal structures are unremarkable. IMPRESSION: Cardiomegaly with mild, diffuse bilateral interstitial pulmonary opacity and possible layering small pleural effusions. Findings are most consistent with edema. Electronically Signed   By: Lauralyn Primes M.D.   On: 05/04/2020 17:59    EKG: Independently reviewed.  Shows sinus tachycardia with a rate of 120.  Evidence of LVH.  Multiple PVCs with nonspecific ST changes in the lateral leads.  Assessment/Plan Principal Problem:   Acute exacerbation of CHF (congestive heart failure) (HCC) Active Problems:   Arthritis of knee   Class 3 severe obesity with serious comorbidity and body mass index (BMI) of  45.0 to 49.9 in adult [facility])   Anxiety disorder   Chronic kidney disease, stage 3a (HCC)   Esophageal reflux   High cholesterol     #1 acute exacerbation of CHF: Patient will be admitted with presumably new onset CHF.  She has no cardiomegaly and possible pleural effusion with pulmonary edema.  We will admit the patient to cardiac telemetry.  Get echocardiogram.  Diuresis patient.  I&O strictly.  Monitor response.  She is currently on oxygen.  Patient may end up going home with home O2 if not titrated off.  Cardiology consult if echo is abnormal.  #2 morbid obesity: This may be contributed to patient's respiratory issues.  She could be having obesity hypoventilation syndrome.  #3 hyperlipidemia: Continue with a statin.  #4 elevated blood pressure: No documented history of hypertension but patient has significant elevation of systolic and diastolic blood pressure.  Will diurese and also treat for essential hypertension.  #5 anxiety disorder: Confirm and resume home regimen  #6 chronic kidney disease stage III: Renal function appears stable at this point GFR virtually normal.  Continue to monitor  #7 GERD: Confirm and continue home regimen.   DVT prophylaxis: Lovenox Code Status: Full code Family Communication: No family at bedside Disposition Plan: Home Consults called: None Admission status: Inpatient  Severity of Illness: The appropriate patient status for this patient is INPATIENT. Inpatient status is judged to be reasonable and necessary in order to provide the required intensity of service to ensure the patient's safety. The patient's presenting symptoms, physical exam findings, and initial radiographic and laboratory data in the context of their chronic comorbidities is felt to place them at high risk for further clinical deterioration. Furthermore, it is not anticipated that the patient will be medically stable for discharge from the hospital within 2 midnights of admission. The  following factors support the patient status of inpatient.   " The patient's presenting symptoms include shortness of breath. " The worrisome physical exam findings include morbidly obese with lower extremity edema. " The initial radiographic and laboratory data are worrisome because of bilateral  pulmonary edema. " The chronic co-morbidities include osteoarthritis.   * I certify that at the point of admission it is my clinical judgment that the patient will require inpatient hospital care spanning beyond 2 midnights from the point of admission due to high intensity of service, high risk for further deterioration and high frequency of surveillance required.Lonia Blood MD [facility] Pager [phone]  If 7PM-7AM, please contact night-coverage [email or web address] Password [facility]  05/04/2020, 11:02 PM

## 2020-05-05 ENCOUNTER — Inpatient Hospital Stay (HOSPITAL_COMMUNITY): Payer: Medicare Other

## 2020-05-05 DIAGNOSIS — I5031 Acute diastolic (congestive) heart failure: Secondary | ICD-10-CM | POA: Diagnosis not present

## 2020-05-05 DIAGNOSIS — I509 Heart failure, unspecified: Secondary | ICD-10-CM | POA: Diagnosis not present

## 2020-05-05 LAB — MRSA PCR SCREENING: MRSA by PCR: NEGATIVE

## 2020-05-05 LAB — TROPONIN I (HIGH SENSITIVITY)
Troponin I (High Sensitivity): 34 ng/L — ABNORMAL HIGH (ref <18)
Troponin I (High Sensitivity): 30 ng/L — ABNORMAL HIGH (ref <18)
Troponin I (High Sensitivity): 29 ng/L — ABNORMAL HIGH (ref <18)

## 2020-05-05 LAB — CBC WITH DIFFERENTIAL/PLATELET
Abs Immature Granulocytes: 0.01 10*3/uL (ref 0.00–0.07)
Basophils Absolute: 0 10*3/uL (ref 0.0–0.1)
Basophils Relative: 1 %
Eosinophils Absolute: 0.2 10*3/uL (ref 0.0–0.5)
Eosinophils Relative: 2 %
HCT: 44.6 % (ref 36.0–46.0)
Hemoglobin: 14.6 g/dL (ref 12.0–15.0)
Immature Granulocytes: 0 %
Lymphocytes Relative: 13 %
Lymphs Abs: 1 10*3/uL (ref 0.7–4.0)
MCH: 31.3 pg (ref 26.0–34.0)
MCHC: 32.7 g/dL (ref 30.0–36.0)
MCV: 95.7 fL (ref 80.0–100.0)
Monocytes Absolute: 0.6 10*3/uL (ref 0.1–1.0)
Monocytes Relative: 8 %
Neutro Abs: 6.4 10*3/uL (ref 1.7–7.7)
Neutrophils Relative %: 76 %
Platelets: 211 10*3/uL (ref 150–400)
RBC: 4.66 MIL/uL (ref 3.87–5.11)
RDW: 14.3 % (ref 11.5–15.5)
WBC: 8.3 10*3/uL (ref 4.0–10.5)
nRBC: 0 % (ref 0.0–0.2)

## 2020-05-05 LAB — ECHOCARDIOGRAM COMPLETE
Area-P 1/2: 4.12 cm2
Height: 67 in
S' Lateral: 5 cm
Weight: 5238.13 oz

## 2020-05-05 LAB — BASIC METABOLIC PANEL
Anion gap: 4 — ABNORMAL LOW (ref 5–15)
BUN: 16 mg/dL (ref 8–23)
CO2: 32 mmol/L (ref 22–32)
Calcium: 8.9 mg/dL (ref 8.9–10.3)
Chloride: 103 mmol/L (ref 98–111)
Creatinine, Ser: 0.96 mg/dL (ref 0.44–1.00)
GFR, Estimated: >60 mL/min (ref >60)
Glucose, Bld: 131 mg/dL — ABNORMAL HIGH (ref 70–99)
Potassium: 3.5 mmol/L (ref 3.5–5.1)
Sodium: 139 mmol/L (ref 135–145)

## 2020-05-05 LAB — BLOOD GAS, VENOUS
Acid-Base Excess: 6.8 mmol/L — ABNORMAL HIGH (ref 0.0–2.0)
Bicarbonate: 31.9 mmol/L — ABNORMAL HIGH (ref 20.0–28.0)
O2 Saturation: 91.5 %
Patient temperature: 37
pCO2, Ven: 55.1 mmHg (ref 44.0–60.0)
pH, Ven: 7.38 (ref 7.250–7.430)
pO2, Ven: 59.7 mmHg — ABNORMAL HIGH (ref 32.0–45.0)

## 2020-05-05 LAB — TSH: TSH: 2.941 u[IU]/mL (ref 0.350–4.500)

## 2020-05-05 MED ORDER — PRAMIPEXOLE DIHYDROCHLORIDE 0.25 MG PO TABS
0.2500 mg | ORAL_TABLET | Freq: Two times a day (BID) | ORAL | Status: DC
  Administered 2020-05-05 – 2020-05-08 (×5): 0.25 mg via ORAL
  Filled 2020-05-05 (×7): qty 1

## 2020-05-05 MED ORDER — VITAMIN D (ERGOCALCIFEROL) 1.25 MG (50000 UNIT) PO CAPS
50000.0000 [IU] | ORAL_CAPSULE | ORAL | Status: DC
  Administered 2020-05-06: 50000 [IU] via ORAL
  Filled 2020-05-05: qty 1

## 2020-05-05 MED ORDER — BUPROPION HCL ER (XL) 150 MG PO TB24
150.0000 mg | ORAL_TABLET | Freq: Every day | ORAL | Status: DC
  Administered 2020-05-05 – 2020-05-08 (×3): 150 mg via ORAL
  Filled 2020-05-05 (×4): qty 1

## 2020-05-05 MED ORDER — AMLODIPINE BESYLATE 5 MG PO TABS
5.0000 mg | ORAL_TABLET | Freq: Every day | ORAL | Status: DC
  Administered 2020-05-05: 5 mg via ORAL
  Filled 2020-05-05: qty 1

## 2020-05-05 MED ORDER — TRAZODONE HCL 50 MG PO TABS
150.0000 mg | ORAL_TABLET | Freq: Every day | ORAL | Status: DC
  Administered 2020-05-05 – 2020-05-07 (×3): 150 mg via ORAL
  Filled 2020-05-05 (×3): qty 3

## 2020-05-05 MED ORDER — PAROXETINE HCL 20 MG PO TABS
40.0000 mg | ORAL_TABLET | ORAL | Status: DC
  Administered 2020-05-06 – 2020-05-08 (×3): 40 mg via ORAL
  Filled 2020-05-05 (×4): qty 2

## 2020-05-05 MED ORDER — HYDRALAZINE HCL 20 MG/ML IJ SOLN: 10.0000 mg | Freq: Four times a day (QID) | INTRAMUSCULAR | Status: DC | PRN

## 2020-05-05 MED ORDER — ORAL CARE MOUTH RINSE
15.0000 mL | Freq: Two times a day (BID) | OROMUCOSAL | Status: DC
  Administered 2020-05-05 – 2020-05-07 (×5): 15 mL via OROMUCOSAL

## 2020-05-05 MED ORDER — LOSARTAN POTASSIUM 50 MG PO TABS: 50.0000 mg | ORAL_TABLET | Freq: Every day | ORAL | Status: DC

## 2020-05-05 NOTE — Progress Notes (Signed)
  Echocardiogram 2D Echocardiogram has been performed.  Paige [name] 05/05/2020, 1:59 PM

## 2020-05-05 NOTE — Progress Notes (Signed)
PROGRESS NOTE    Paige [name]  JGO:[id number] DOB: 10/28/1945 DOA: 05/04/2020 PCP: [name], MD   Brief Narrative:  This 75 years old female with PMH significant for osteoarthritis, hyperlipidemia, GERD, depression, chronic lymphedema of both lower extremities, restless leg syndrome and vitamin B12 deficiency presents in the ER with significant shortness of breath and hypoxia.  Patient reports progressive worsening of shortness of breath for last 2 weeks.  She report having shortness of breath on exertion but now happening even at rest.  Patient was severely hypoxic on arrival O2 saturation was 80% on RA requiring nonrebreather at 15 L.  She was significantly wheezing and was given Lasix and breathing treatment.  Chest x-ray shows findings consistent with pulmonary edema.  BNP 256,  Covid and influenza negative.  Patient is admitted for new onset CHF exacerbation.   Assessment & Plan:   Principal Problem:   Acute exacerbation of CHF (congestive heart failure) (HCC) Active Problems:   Arthritis of knee   Class 3 severe obesity with serious comorbidity and body mass index (BMI) of 45.0 to 49.9 in adult [facility])   Anxiety disorder   Chronic kidney disease, stage 3a (HCC)   Esophageal reflux   High cholesterol   Acute hypoxic respiratory failure could be secondary to new onset CHF exacerbation. Patient reports progressive shortness of breath initially at exertion and now happening even at rest. Bilateral leg swelling. BNP 256, chest x-ray showed cardiomegaly with possible pleural effusion , findings consistent with pulmonary edema.   Continue telemetry, obtain 2D echocardiogram. Patient feels better after getting Lasix 40 mg once daily. Continue Lasix 40 mg twice daily, intake/output charting. Daily weight. Patient is weaned down to 2 L via nasal cannula , sats 94%. Consider cardio consult if echo is abnormal.  Elevated troponin: That could be due to demand ischemia.  Patient  denies any chest pain. Continue to trend troponin and consult cardiology if are trending up.  Hyperlipidemia: Continue statin  Anxiety disorder: Resume home medications.  Restless leg syndrome: Continue pramipexole.  Elevated blood pressure: Patient denies any history of hypertension. We will start amlodipine 5 mg. Hydralazine 10 mg IV every 6 as needed.   Morbid obesity : Counseled in detail about diet and exercise. Outpatient PCP follow-up   DVT prophylaxis:  Lovenox Code Status: Full code. Family Communication: No family at bed side. Disposition Plan:   Status is: Inpatient  Remains inpatient appropriate because:Hemodynamically unstable and Inpatient level of care appropriate due to severity of illness   Dispo: The patient is from: Home              Anticipated d/c is to: Home              Patient currently is not medically stable to d/c.   Difficult to place patient No  Consultants:    None  Procedures:  2D  Echo Antimicrobials:   Anti-infectives (From admission, onward)   None      Subjective: Patient was seen and examined at bedside.  She denies any chest pain or shortness of breath.  She is lying comfortably.  She reports having chronic leg edema.  Objective: Vitals:   05/05/20 0401 05/05/20 0628 05/05/20 0700 05/05/20 1140  BP:  (!) 152/82 (!) 150/134 132/68  Pulse:  81 88 91  Resp:  19 20 20   Temp:   98 F (36.7 C) 98.2 F (36.8 C)  TempSrc:   Axillary Oral  SpO2:  98% 96% 94%  Weight: (!) 148.5 kg     Height:        Intake/Output Summary (Last 24 hours) at 05/05/2020 1406 Last data filed at 05/05/2020 1100 Gross per 24 hour  Intake 480 ml  Output 2600 ml  Net -2120 ml   Filed Weights   05/04/20 1653 05/04/20 2344 05/05/20 0401  Weight: (!) 147.4 kg (!) 148.5 kg (!) 148.5 kg    Examination:  General exam: Appears calm and comfortable, not in any acute distress. Respiratory system: Clear to auscultation. Respiratory effort  normal. Cardiovascular system: S1 & S2 heard, RRR. No JVD, murmurs, rubs, gallops or clicks. No pedal edema. Gastrointestinal system: Abdomen is nondistended, soft and nontender. No organomegaly or masses felt. Normal bowel sounds heard. Central nervous system: Alert and oriented. No focal neurological deficits. Extremities:Chronic bilateral leg edema consistent with lymphedema. Skin: No rashes, lesions or ulcers Psychiatry: Judgement and insight appear normal. Mood & affect appropriate.     Data Reviewed: I have personally reviewed following labs and imaging studies  CBC: Recent Labs  Lab 05/04/20 1714 05/04/20 1723 05/05/20 0038  WBC 8.0  --  8.3  NEUTROABS 6.2  --  6.4  HGB 15.2* 15.6* 14.6  HCT 47.2* 46.0 44.6  MCV 95.7  --  95.7  PLT 225  --  211   Basic Metabolic Panel: Recent Labs  Lab 05/04/20 1714 05/04/20 1723 05/05/20 0038  NA 139 141 139  K 4.3 4.5 3.5  CL 103  --  103  CO2 29  --  32  GLUCOSE 99  --  131*  BUN 19  --  16  CREATININE 0.93  --  0.96  CALCIUM 9.1  --  8.9   GFR: Estimated Creatinine Clearance: 78.2 mL/min (by C-G formula based on SCr of 0.96 mg/dL). Liver Function Tests: No results for input(s): AST, ALT, ALKPHOS, BILITOT, PROT, ALBUMIN in the last 168 hours. No results for input(s): LIPASE, AMYLASE in the last 168 hours. No results for input(s): AMMONIA in the last 168 hours. Coagulation Profile: No results for input(s): INR, PROTIME in the last 168 hours. Cardiac Enzymes: No results for input(s): CKTOTAL, CKMB, CKMBINDEX, TROPONINI in the last 168 hours. BNP (last 3 results) No results for input(s): PROBNP in the last 8760 hours. HbA1C: No results for input(s): HGBA1C in the last 72 hours. CBG: No results for input(s): GLUCAP in the last 168 hours. Lipid Profile: No results for input(s): CHOL, HDL, LDLCALC, TRIG, CHOLHDL, LDLDIRECT in the last 72 hours. Thyroid Function Tests: Recent Labs    05/05/20 0038  TSH 2.941   Anemia  Panel: No results for input(s): VITAMINB12, FOLATE, FERRITIN, TIBC, IRON, RETICCTPCT in the last 72 hours. Sepsis Labs: No results for input(s): PROCALCITON, LATICACIDVEN in the last 168 hours.  Recent Results (from the past 240 hour(s))  Resp Panel by RT-PCR (Flu A&B, Covid) Nasopharyngeal Swab     Status: None   Collection Time: 05/04/20  5:14 PM   Specimen: Nasopharyngeal Swab; Nasopharyngeal(NP) swabs in vial transport medium  Result Value Ref Range Status   SARS Coronavirus 2 by RT PCR NEGATIVE NEGATIVE Final    Comment: (NOTE) SARS-CoV-2 target nucleic acids are NOT DETECTED.  The SARS-CoV-2 RNA is generally detectable in upper respiratory specimens during the acute phase of infection. The lowest concentration of SARS-CoV-2 viral copies this assay can detect is 138 copies/mL. A negative result does not preclude SARS-Cov-2 infection and should not be used as the sole basis for treatment or other  patient management decisions. A negative result may occur with  improper specimen collection/handling, submission of specimen other than nasopharyngeal swab, presence of viral mutation(s) within the areas targeted by this assay, and inadequate number of viral copies(<138 copies/mL). A negative result must be combined with clinical observations, patient history, and epidemiological information. The expected result is Negative.  Fact Sheet for Patients:  BloggerCourse.com  Fact Sheet for Healthcare Providers:  SeriousBroker.it  This test is no t yet approved or cleared by the [place] FDA and  has been authorized for detection and/or diagnosis of SARS-CoV-2 by FDA under an Emergency Use Authorization (EUA). This EUA will remain  in effect (meaning this test can be used) for the duration of the COVID-19 declaration under Section 564(b)(1) of the Act, 21 U.S.C.section 360bbb-3(b)(1), unless the authorization is terminated  or  revoked sooner.       Influenza A by PCR NEGATIVE NEGATIVE Final   Influenza B by PCR NEGATIVE NEGATIVE Final    Comment: (NOTE) The Xpert Xpress SARS-CoV-2/FLU/RSV plus assay is intended as an aid in the diagnosis of influenza from Nasopharyngeal swab specimens and should not be used as a sole basis for treatment. Nasal washings and aspirates are unacceptable for Xpert Xpress SARS-CoV-2/FLU/RSV testing.  Fact Sheet for Patients: BloggerCourse.com  Fact Sheet for Healthcare Providers: SeriousBroker.it  This test is not yet approved or cleared by the [place] FDA and has been authorized for detection and/or diagnosis of SARS-CoV-2 by FDA under an Emergency Use Authorization (EUA). This EUA will remain in effect (meaning this test can be used) for the duration of the COVID-19 declaration under Section 564(b)(1) of the Act, 21 U.S.C. section 360bbb-3(b)(1), unless the authorization is terminated or revoked.  Performed at [facility], [address] [place], [place] [address]   MRSA PCR Screening     Status: None   Collection Time: 05/05/20 12:45 AM   Specimen: Nasal Mucosa; Nasopharyngeal  Result Value Ref Range Status   MRSA by PCR NEGATIVE NEGATIVE Final    Comment:        The GeneXpert MRSA Assay (FDA approved for NASAL specimens only), is one component of a comprehensive MRSA colonization surveillance program. It is not intended to diagnose MRSA infection nor to guide or monitor treatment for MRSA infections. Performed at [facility], [address], [place], [place] [address]     Radiology Studies: DG Chest Port 1 View  Result Date: 05/04/2020 CLINICAL DATA:  Dyspnea, shortness of breath EXAM: PORTABLE CHEST 1 VIEW COMPARISON:  11/19/2015 FINDINGS: Cardiomegaly. Mild, diffuse bilateral interstitial pulmonary opacity. Possible layering small pleural effusions. The visualized skeletal structures  are unremarkable. IMPRESSION: Cardiomegaly with mild, diffuse bilateral interstitial pulmonary opacity and possible layering small pleural effusions. Findings are most consistent with edema. Electronically Signed   By: Lauralyn Primes M.D.   On: 05/04/2020 17:59   Scheduled Meds: . amLODipine  5 mg Oral Daily  . buPROPion  150 mg Oral Daily  . enoxaparin (LOVENOX) injection  40 mg Subcutaneous Daily  . furosemide  40 mg Intravenous BID  . mouth rinse  15 mL Mouth Rinse BID  . [START ON 05/06/2020] PARoxetine  40 mg Oral [id number]  . pramipexole  0.25 mg Oral BID  . sodium chloride flush  3 mL Intravenous Q12H  . traZODone  150 mg Oral QHS  . Vitamin D (Ergocalciferol)  50,000 Units Oral Q7 days   Continuous Infusions: . sodium chloride  LOS: 1 day    Time spent: 35 mins    Porter Nakama, MD [facility]   If 7PM-7AM, please contact night-coverage

## 2020-05-05 NOTE — Plan of Care (Signed)
  Problem: Education: Goal: Ability to demonstrate management of disease process will improve Outcome: Not Progressing Goal: Ability to verbalize understanding of medication therapies will improve Pt verbalized indication and need of lasix.  Outcome: Not Progressing Goal: Individualized Educational Video(s) Outcome: Not Progressing   Problem: Activity: Goal: Capacity to carry out activities will improve Outcome: Not Progressing   Problem: Cardiac: Goal: Ability to achieve and maintain adequate cardiopulmonary perfusion will improve Outcome: Not Progressing

## 2020-05-06 ENCOUNTER — Other Ambulatory Visit (HOSPITAL_COMMUNITY): Payer: Self-pay

## 2020-05-06 DIAGNOSIS — I509 Heart failure, unspecified: Secondary | ICD-10-CM | POA: Diagnosis not present

## 2020-05-06 DIAGNOSIS — F419 Anxiety disorder, unspecified: Secondary | ICD-10-CM | POA: Diagnosis not present

## 2020-05-06 DIAGNOSIS — N1831 Chronic kidney disease, stage 3a: Secondary | ICD-10-CM | POA: Diagnosis not present

## 2020-05-06 LAB — LIPID PANEL
Cholesterol: 141 mg/dL (ref 0–200)
HDL: 44 mg/dL (ref >40)
LDL Cholesterol: 85 mg/dL (ref 0–99)
Total CHOL/HDL Ratio: 3.2 RATIO
Triglycerides: 62 mg/dL (ref <150)
VLDL: 12 mg/dL (ref 0–40)

## 2020-05-06 LAB — BASIC METABOLIC PANEL
Anion gap: 7 (ref 5–15)
BUN: 21 mg/dL (ref 8–23)
CO2: 35 mmol/L — ABNORMAL HIGH (ref 22–32)
Calcium: 8.9 mg/dL (ref 8.9–10.3)
Chloride: 97 mmol/L — ABNORMAL LOW (ref 98–111)
Creatinine, Ser: 1.03 mg/dL — ABNORMAL HIGH (ref 0.44–1.00)
GFR, Estimated: 57 mL/min — ABNORMAL LOW (ref >60)
Glucose, Bld: 114 mg/dL — ABNORMAL HIGH (ref 70–99)
Potassium: 3.3 mmol/L — ABNORMAL LOW (ref 3.5–5.1)
Sodium: 139 mmol/L (ref 135–145)

## 2020-05-06 MED ORDER — POTASSIUM CHLORIDE CRYS ER 20 MEQ PO TBCR
40.0000 meq | EXTENDED_RELEASE_TABLET | Freq: Two times a day (BID) | ORAL | Status: AC
  Administered 2020-05-06 (×2): 40 meq via ORAL
  Filled 2020-05-06 (×2): qty 2

## 2020-05-06 MED ORDER — LOSARTAN POTASSIUM 25 MG PO TABS
25.0000 mg | ORAL_TABLET | Freq: Every day | ORAL | Status: DC
  Administered 2020-05-06 – 2020-05-08 (×2): 25 mg via ORAL
  Filled 2020-05-06 (×3): qty 1

## 2020-05-06 MED ORDER — FUROSEMIDE 10 MG/ML IJ SOLN
40.0000 mg | Freq: Every day | INTRAMUSCULAR | Status: DC
Start: 2020-05-07 — End: 2020-05-08
  Administered 2020-05-08: 40 mg via INTRAVENOUS
  Filled 2020-05-06 (×2): qty 4

## 2020-05-06 MED ORDER — CARVEDILOL 3.125 MG PO TABS
3.1250 mg | ORAL_TABLET | Freq: Two times a day (BID) | ORAL | Status: DC
  Administered 2020-05-06 – 2020-05-08 (×3): 3.125 mg via ORAL
  Filled 2020-05-06 (×3): qty 1

## 2020-05-06 NOTE — Progress Notes (Signed)
PROGRESS NOTE    Paige [name]  KYH:[id number] DOB: 1945/04/22 DOA: 05/04/2020 PCP: [name], MD     Brief Narrative:  This 75 years old WF PMHx osteoarthritis, hyperlipidemia, GERD, depression, chronic lymphedema of both lower extremities, restless leg syndrome and vitamin B12 deficiency   Presents in the ER with significant shortness of breath and hypoxia.  Patient reports progressive worsening of shortness of breath for last 2 weeks.  She report having shortness of breath on exertion but now happening even at rest.  Patient was severely hypoxic on arrival O2 saturation was 80% on RA requiring nonrebreather at 15 L.  She was significantly wheezing and was given Lasix and breathing treatment.  Chest x-ray shows findings consistent with pulmonary edema.  BNP 256,  Covid and influenza negative.  Patient is admitted for new onset CHF exacerbation.   Subjective: Afebrile overnight, A/O x4, positive S OB.  States not on home O2 or CPAP.  Patient also states she wishes to be DNR as her CODE STATUS.  Per patient has never had any heart problems in the past.   Assessment & Plan: Covid vaccination; vaccinated 3/3   Principal Problem:   Acute exacerbation of CHF (congestive heart failure) (HCC) Active Problems:   Arthritis of knee   Class 3 severe obesity with serious comorbidity and body mass index (BMI) of 45.0 to 49.9 in adult [facility])   Anxiety disorder   Chronic kidney disease, stage 3a (HCC)   Esophageal reflux   High cholesterol  Acute respiratory failure with hypoxia -Most likely secondary to acute systolic CHF -Titrate O2 to maintain SPO2> 92% -Continuous pulse ox  Acute systolic and diastolic CHF -EF 30 to 40%.  See results below -Strict in and out -Daily weight -Discussed case with Dr. Algie Coffer cardiology who will perform cardiac catheterization either on 4/14 or 4/15. -Coreg 3.125 mg BID -Lasix IV 40 mg BID -Hydralazine PRN -Losartan 25 mg daily  Elevated  troponin -Patient with new onset systolic and diastolic CHF most likely secondary to MI. Results for Paige [name], Paige [name] (MRN [id number]) as of 05/06/2020 10:16  Ref. Range 05/05/2020 08:57 05/05/2020 13:59 05/05/2020 15:12 05/05/2020 18:00  Troponin I (High Sensitivity) Latest Ref Range: <18 ng/L 34 (H)  30 (H) 29 (H)  -Trending down   Essential HTN -See CHF   Hyperlipidemia: Continue statin  Anxiety disorder: Resume home medications.  Restless leg syndrome: Continue pramipexole.    Morbid obesity (Body mass index is 50.1 kg/m). Counseled in detail about diet and exercise. Outpatient PCP follow-up  Hypokalemia -Potassium goal> 4 -4/13 K-Dur 40 mEq x 2 doses           DVT prophylaxis: Lovenox Code Status: DNR Family Communication:  Status is: Inpatient    Dispo: The patient is from: Home              Anticipated d/c is to: Home              Anticipated d/c date is: 4/17              Patient currently unstable      Consultants:  Dr. Algie Coffer cardiology   Procedures/Significant Events:  4/12 Echocardiogram;Left Ventricle: LVEF=35 to 40%. Moderately decreased function. -Left ventricular diastolic parameters are consistent with Grade II diastolic dysfunction (pseudonormalization).    I have personally reviewed and interpreted all radiology studies and my findings are as above.  VENTILATOR SETTINGS: Nasal cannula 4/13 Flow 2 L/min SPO2 94   Cultures  Antimicrobials:    Devices    LINES / TUBES:      Continuous Infusions: . sodium chloride       Objective: Vitals:   05/05/20 2237 05/06/20 0000 05/06/20 0200 05/06/20 0400  BP:  114/72 122/65 140/80  Pulse: 80 83 75 84  Resp: 19 19 15 19   Temp:  97.9 F (36.6 C)  97.6 F (36.4 C)  TempSrc:  Oral  Oral  SpO2: 94% 93% 93% 95%  Weight:    (!) 145.1 kg  Height:        Intake/Output Summary (Last 24 hours) at 05/06/2020 0741 Last data filed at 05/06/2020 05/08/2020 Gross per 24 hour   Intake 1070 ml  Output 2950 ml  Net -1880 ml   Filed Weights   05/04/20 2344 05/05/20 0401 05/06/20 0400  Weight: (!) 148.5 kg (!) 148.5 kg (!) 145.1 kg    Examination:  General: A/O x4, positive acute respiratory distress Eyes: negative scleral hemorrhage, negative anisocoria, negative icterus ENT: Negative Runny nose, negative gingival bleeding, Neck:  Negative scars, masses, torticollis, lymphadenopathy, JVD Lungs: Clear to auscultation bilaterally without wheezes or crackles Cardiovascular: Regular rate and rhythm without murmur gallop or rub normal S1 and S2 Abdomen: MORBIDLY OBESE, negative abdominal pain, nondistended, positive soft, bowel sounds, no rebound, no ascites, no appreciable mass Extremities: No significant cyanosis, clubbing.  Bilateral lower extremity edema 2-3+ entheses patient states has had lower extremity edema for years)  Skin: Negative rashes, lesions, ulcers Psychiatric:  Negative depression, negative anxiety, negative fatigue, negative mania  Central nervous system:  Cranial nerves II through XII intact, tongue/uvula midline, all extremities muscle strength 5/5, sensation intact throughout, negative dysarthria, negative expressive aphasia, negative receptive aphasia.  .     Data Reviewed: Care during the described time interval was provided by me .  I have reviewed this patient's available data, including medical history, events of note, physical examination, and all test results as part of my evaluation.  CBC: Recent Labs  Lab 05/04/20 1714 05/04/20 1723 05/05/20 0038  WBC 8.0  --  8.3  NEUTROABS 6.2  --  6.4  HGB 15.2* 15.6* 14.6  HCT 47.2* 46.0 44.6  MCV 95.7  --  95.7  PLT 225  --  211   Basic Metabolic Panel: Recent Labs  Lab 05/04/20 1714 05/04/20 1723 05/05/20 0038 05/06/20 0125  NA 139 141 139 139  K 4.3 4.5 3.5 3.3*  CL 103  --  103 97*  CO2 29  --  32 35*  GLUCOSE 99  --  131* 114*  BUN 19  --  16 21  CREATININE 0.93  --   0.96 1.03*  CALCIUM 9.1  --  8.9 8.9   GFR: Estimated Creatinine Clearance: 71.9 mL/min (A) (by C-G formula based on SCr of 1.03 mg/dL (H)). Liver Function Tests: No results for input(s): AST, ALT, ALKPHOS, BILITOT, PROT, ALBUMIN in the last 168 hours. No results for input(s): LIPASE, AMYLASE in the last 168 hours. No results for input(s): AMMONIA in the last 168 hours. Coagulation Profile: No results for input(s): INR, PROTIME in the last 168 hours. Cardiac Enzymes: No results for input(s): CKTOTAL, CKMB, CKMBINDEX, TROPONINI in the last 168 hours. BNP (last 3 results) No results for input(s): PROBNP in the last 8760 hours. HbA1C: No results for input(s): HGBA1C in the last 72 hours. CBG: No results for input(s): GLUCAP in the last 168 hours. Lipid Profile: No results for input(s): CHOL, HDL, LDLCALC, TRIG, CHOLHDL, LDLDIRECT  in the last 72 hours. Thyroid Function Tests: Recent Labs    05/05/20 0038  TSH 2.941   Anemia Panel: No results for input(s): VITAMINB12, FOLATE, FERRITIN, TIBC, IRON, RETICCTPCT in the last 72 hours. Sepsis Labs: No results for input(s): PROCALCITON, LATICACIDVEN in the last 168 hours.  Recent Results (from the past 240 hour(s))  Resp Panel by RT-PCR (Flu A&B, Covid) Nasopharyngeal Swab     Status: None   Collection Time: 05/04/20  5:14 PM   Specimen: Nasopharyngeal Swab; Nasopharyngeal(NP) swabs in vial transport medium  Result Value Ref Range Status   SARS Coronavirus 2 by RT PCR NEGATIVE NEGATIVE Final    Comment: (NOTE) SARS-CoV-2 target nucleic acids are NOT DETECTED.  The SARS-CoV-2 RNA is generally detectable in upper respiratory specimens during the acute phase of infection. The lowest concentration of SARS-CoV-2 viral copies this assay can detect is 138 copies/mL. A negative result does not preclude SARS-Cov-2 infection and should not be used as the sole basis for treatment or other patient management decisions. A negative result may  occur with  improper specimen collection/handling, submission of specimen other than nasopharyngeal swab, presence of viral mutation(s) within the areas targeted by this assay, and inadequate number of viral copies(<138 copies/mL). A negative result must be combined with clinical observations, patient history, and epidemiological information. The expected result is Negative.  Fact Sheet for Patients:  BloggerCourse.com  Fact Sheet for Healthcare Providers:  SeriousBroker.it  This test is no t yet approved or cleared by the [place] FDA and  has been authorized for detection and/or diagnosis of SARS-CoV-2 by FDA under an Emergency Use Authorization (EUA). This EUA will remain  in effect (meaning this test can be used) for the duration of the COVID-19 declaration under Section 564(b)(1) of the Act, 21 U.S.C.section 360bbb-3(b)(1), unless the authorization is terminated  or revoked sooner.       Influenza A by PCR NEGATIVE NEGATIVE Final   Influenza B by PCR NEGATIVE NEGATIVE Final    Comment: (NOTE) The Xpert Xpress SARS-CoV-2/FLU/RSV plus assay is intended as an aid in the diagnosis of influenza from Nasopharyngeal swab specimens and should not be used as a sole basis for treatment. Nasal washings and aspirates are unacceptable for Xpert Xpress SARS-CoV-2/FLU/RSV testing.  Fact Sheet for Patients: BloggerCourse.com  Fact Sheet for Healthcare Providers: SeriousBroker.it  This test is not yet approved or cleared by the [place] FDA and has been authorized for detection and/or diagnosis of SARS-CoV-2 by FDA under an Emergency Use Authorization (EUA). This EUA will remain in effect (meaning this test can be used) for the duration of the COVID-19 declaration under Section 564(b)(1) of the Act, 21 U.S.C. section 360bbb-3(b)(1), unless the authorization is terminated  or revoked.  Performed at [facility], [address], [place], [place] [address]   MRSA PCR Screening     Status: None   Collection Time: 05/05/20 12:45 AM   Specimen: Nasal Mucosa; Nasopharyngeal  Result Value Ref Range Status   MRSA by PCR NEGATIVE NEGATIVE Final    Comment:        The GeneXpert MRSA Assay (FDA approved for NASAL specimens only), is one component of a comprehensive MRSA colonization surveillance program. It is not intended to diagnose MRSA infection nor to guide or monitor treatment for MRSA infections. Performed at [facility], [address], [place], [place] [address]          Radiology Studies: [facility] [place] [address]  Result Date: 05/04/2020 CLINICAL DATA:  Dyspnea, shortness of breath EXAM: PORTABLE CHEST 1 VIEW COMPARISON:  11/19/2015 FINDINGS: Cardiomegaly. Mild, diffuse bilateral interstitial pulmonary opacity. Possible layering small pleural effusions. The visualized skeletal structures are unremarkable. IMPRESSION: Cardiomegaly with mild, diffuse bilateral interstitial pulmonary opacity and possible layering small pleural effusions. Findings are most consistent with edema. Electronically Signed   By: Lauralyn PrimesAlex  Bibbey M.D.   On: 05/04/2020 17:59   ECHOCARDIOGRAM COMPLETE  Result Date: 05/05/2020    ECHOCARDIOGRAM REPORT   Patient Name:   Paige JumboREOMA E [name] Date of Exam: 05/05/2020 Medical Rec #:  [id number]        Height:       67.0 in Accession #:    [id number]       Weight:       327.4 lb Date of Birth:  [birth year]        BSA:          2.493 m Patient Age:    74 years         BP:           132/68 mmHg Patient Gender: F                HR:           91 bpm. Exam Location:  Inpatient Procedure: 2D Echo Indications:    CHF-Acute Diastolic I50.31  History:        Patient has no prior history of Echocardiogram examinations.                 Risk Factors:Hypertension.  Sonographer:    Thurman Coyerasey Kirkpatrick RDCS (AE) Referring Phys: [id number] MOHAMMAD L GARBA  IMPRESSIONS  1. Left ventricular ejection fraction, by estimation, is 35 to 40%. The left ventricle has moderately decreased function. Left ventricular endocardial border not optimally defined to evaluate regional wall motion. The left ventricular internal cavity size was mildly dilated. Left ventricular diastolic parameters are consistent with Grade II diastolic dysfunction (pseudonormalization).  2. Right ventricular systolic function is mildly reduced. The right ventricular size is normal.  3. The mitral valve is grossly normal. Trivial mitral valve regurgitation.  4. The aortic valve is grossly normal. Aortic valve regurgitation is not visualized. FINDINGS  Left Ventricle: Left ventricular ejection fraction, by estimation, is 35 to 40%. The left ventricle has moderately decreased function. Left ventricular endocardial border not optimally defined to evaluate regional wall motion. The left ventricular internal cavity size was mildly dilated. There is no left ventricular hypertrophy. Left ventricular diastolic parameters are consistent with Grade II diastolic dysfunction (pseudonormalization). Right Ventricle: The right ventricular size is normal. Right vetricular wall thickness was not well visualized. Right ventricular systolic function is mildly reduced. Left Atrium: Left atrial size was normal in size. Right Atrium: Right atrial size was normal in size. Pericardium: Trivial pericardial effusion is present. Mitral Valve: The mitral valve is grossly normal. Trivial mitral valve regurgitation. Tricuspid Valve: The tricuspid valve is not well visualized. Tricuspid valve regurgitation is trivial. Aortic Valve: The aortic valve is grossly normal. Aortic valve regurgitation is not visualized. Pulmonic Valve: The pulmonic valve was not well visualized. Pulmonic valve regurgitation is trivial. Aorta: The aortic root and ascending aorta are structurally normal, with no evidence of dilitation. IAS/Shunts: The atrial  septum is grossly normal.  LEFT VENTRICLE PLAX 2D LVIDd:         6.10 cm  Diastology LVIDs:         5.00 cm  LV e' medial:  6.05 cm/s LV PW:         1.00 cm  LV E/e' medial:  15.9 LV IVS:        1.00 cm  LV e' lateral:   6.80 cm/s LVOT diam:     2.30 cm  LV E/e' lateral: 14.1 LV SV:         45 LV SV Index:   18 LVOT Area:     4.15 cm  RIGHT VENTRICLE RV S prime:     10.70 cm/s TAPSE (M-mode): 1.5 cm LEFT ATRIUM             Index       RIGHT ATRIUM           Index LA diam:        4.20 cm 1.68 cm/m  RA Area:     14.90 cm LA Vol (A2C):   86.0 ml 34.49 ml/m RA Volume:   34.40 ml  13.80 ml/m LA Vol (A4C):   60.3 ml 24.18 ml/m LA Biplane Vol: 73.0 ml 29.28 ml/m  AORTIC VALVE LVOT Vmax:   55.50 cm/s LVOT Vmean:  36.700 cm/s LVOT VTI:    0.109 m  AORTA Ao Root diam: 3.60 cm MITRAL VALVE MV Area (PHT): 4.12 cm    SHUNTS MV Decel Time: 184 msec    Systemic VTI:  0.11 m MV E velocity: 96.00 cm/s  Systemic Diam: 2.30 cm MV A velocity: 49.00 cm/s MV E/A ratio:  1.96 Kristeen Miss MD Electronically signed by Kristeen Miss MD Signature Date/Time: 05/05/2020/4:17:02 PM    Final         Scheduled Meds: . amLODipine  5 mg Oral Daily  . buPROPion  150 mg Oral Daily  . enoxaparin (LOVENOX) injection  40 mg Subcutaneous Daily  . furosemide  40 mg Intravenous BID  . mouth rinse  15 mL Mouth Rinse BID  . PARoxetine  40 mg Oral [id number]  . pramipexole  0.25 mg Oral BID  . sodium chloride flush  3 mL Intravenous Q12H  . traZODone  150 mg Oral QHS  . Vitamin D (Ergocalciferol)  50,000 Units Oral Q Wed   Continuous Infusions: . sodium chloride       LOS: 2 days    Time spent:40 min    Kaylib Furness, Roselind Messier, MD [facility]   If 7PM-7AM, please contact night-coverage 05/06/2020, 7:41 AM

## 2020-05-06 NOTE — Plan of Care (Signed)
?  Problem: Education: ?Goal: Ability to demonstrate management of disease process will improve ?Outcome: Progressing ?  ?Problem: Activity: ?Goal: Capacity to carry out activities will improve ?Outcome: Progressing ?  ?Problem: Cardiac: ?Goal: Ability to achieve and maintain adequate cardiopulmonary perfusion will improve ?Outcome: Progressing ?  ?

## 2020-05-06 NOTE — Consult Note (Addendum)
Referring Physician: Cipriano Bunker, Md   Paige [name] is an 75 y.o. female.                       Chief Complaint: Shortness of breath and leg edema  HPI: 75 years old white female with no known cardiac condition has one month history of shortness of breath and 6 month history of leg edema. She has good diuresis with IV lasix use. She has PMH of arthritis, back pain, hypertension, morbid obesity and CKD II. Her LDl cholesterol is minimally elevated. Her BNP is high at 256 pg. Chest x-ray showed pulmonary edema and cardiomegaly. Her echocardiogram shows global LV systolic dysfunction with 30-35 % EF, moderate MR and severe TR. EKG shows sinus rhythm with frequent VPCs.  Past Medical History:  Diagnosis Date  . Anemia   . Anxiety   . Arthritis   . Back pain   . Depression   . Fatigue   . Gallbladder problem   . GERD (gastroesophageal reflux disease)   . H/O hiatal hernia   . Hypertension    not being treated  . Joint pain   . Lymphedema of both lower extremities   . Obesity   . Osteoarthritis   . PONV (postoperative nausea and vomiting)    after Cholecysectomy  . Restless legs syndrome   . Vertigo   . Vitamin B 12 deficiency       Past Surgical History:  Procedure Laterality Date  . ABDOMINAL HYSTERECTOMY    . CHOLECYSTECTOMY    . COLONOSCOPY    . EYE SURGERY Bilateral    cataracts  . FRACTURE SURGERY Right    arm age 71  . FRACTURE SURGERY Bilateral    both thumbs  . LAPAROSCOPIC GASTRIC BANDING     2010  . OVARIAN CYST REMOVAL    . TOTAL HIP ARTHROPLASTY Left 11/30/2015   Procedure: TOTAL HIP ARTHROPLASTY ANTERIOR APPROACH;  Surgeon: Gean Birchwood, MD;  Location: [facility] OR;  Service: Orthopedics;  Laterality: Left;  . TOTAL KNEE ARTHROPLASTY Left 09/25/2013   Procedure: TOTAL KNEE ARTHROPLASTY;  Surgeon: Nestor Lewandowsky, MD;  Location: [facility] OR;  Service: Orthopedics;  Laterality: Left;  . TOTAL KNEE ARTHROPLASTY Right 02/10/2014   dr Turner Daniels  . TOTAL KNEE ARTHROPLASTY Right  02/10/2014   Procedure: TOTAL KNEE ARTHROPLASTY;  Surgeon: Nestor Lewandowsky, MD;  Location: [facility] OR;  Service: Orthopedics;  Laterality: Right;    Family History  Problem Relation Age of Onset  . Hypertension Mother   . Depression Mother    Social History:  reports that she has never smoked. She has never used smokeless tobacco. She reports current alcohol use. She reports that she does not use drugs.  Allergies:  Allergies  Allergen Reactions  . No Known Allergies     Medications Prior to Admission  Medication Sig Dispense Refill  . ALPRAZolam (XANAX) 0.5 MG tablet Take 0.5 mg by mouth daily.    Marland Kitchen buPROPion (WELLBUTRIN XL) 150 MG 24 hr tablet Take 150 mg by mouth daily.  4  . Calcium-Vitamin D 500-125 MG-UNIT TABS Take 1 tablet by mouth daily at 6 (six) AM.    . cholecalciferol (VITAMIN D3) 25 MCG (1000 UNIT) tablet Take 1,000 Units by mouth daily.    . Cyanocobalamin (VITAMIN B 12 PO) Take 1 tablet by mouth daily.    . naproxen sodium (ALEVE) 220 MG tablet Take 220 mg by mouth daily as needed (pain).    Marland Kitchen  PARoxetine (PAXIL) 20 MG tablet Take 40 mg by mouth daily.    Marland Kitchen Phenylephrine HCl (SINEX REGULAR NA) Place 1 spray into both nostrils daily as needed (congestion).    . pramipexole (MIRAPEX) 0.25 MG tablet Take 0.25 mg by mouth 2 (two) times daily.    . Vitamin D, Ergocalciferol, (DRISDOL) 1.25 MG (50000 UT) CAPS capsule Take 1 capsule (50,000 Units total) by mouth every 7 (seven) days. (Patient not taking: No sig reported) 4 capsule 0    Results for orders placed or performed during the hospital encounter of 05/04/20 (from the past 48 hour(s))  Basic metabolic panel     Status: None   Collection Time: 05/04/20  5:14 PM  Result Value Ref Range   Sodium 139 135 - 145 mmol/L   Potassium 4.3 3.5 - 5.1 mmol/L   Chloride 103 98 - 111 mmol/L   CO2 29 22 - 32 mmol/L   Glucose, Bld 99 70 - 99 mg/dL    Comment: Glucose reference range applies only to samples taken after fasting for at  least 8 hours.   BUN 19 8 - 23 mg/dL   Creatinine, Ser 1.61 0.44 - 1.00 mg/dL   Calcium 9.1 8.9 - 09.6 mg/dL   GFR, Estimated >04 >54 mL/min    Comment: (NOTE) Calculated using the CKD-EPI Creatinine Equation (2021)    Anion gap 7 5 - 15    Comment: Performed at [facility], [address] [place], [place] [address]  CBC with Differential     Status: Abnormal   Collection Time: 05/04/20  5:14 PM  Result Value Ref Range   WBC 8.0 4.0 - 10.5 K/uL   RBC 4.93 3.87 - 5.11 MIL/uL   Hemoglobin 15.2 (H) 12.0 - 15.0 g/dL   HCT 91.4 (H) 78.2 - 95.6 %   MCV 95.7 80.0 - 100.0 fL   MCH 30.8 26.0 - 34.0 pg   MCHC 32.2 30.0 - 36.0 g/dL   RDW 21.3 08.6 - 57.8 %   Platelets 225 150 - 400 K/uL   nRBC 0.0 0.0 - 0.2 %   Neutrophils Relative % 77 %   Neutro Abs 6.2 1.7 - 7.7 K/uL   Lymphocytes Relative 12 %   Lymphs Abs 0.9 0.7 - 4.0 K/uL   Monocytes Relative 8 %   Monocytes Absolute 0.7 0.1 - 1.0 K/uL   Eosinophils Relative 2 %   Eosinophils Absolute 0.2 0.0 - 0.5 K/uL   Basophils Relative 1 %   Basophils Absolute 0.0 0.0 - 0.1 K/uL   Immature Granulocytes 0 %   Abs Immature Granulocytes 0.02 0.00 - 0.07 K/uL    Comment: Performed at [facility], [address], [place], [place] [address]  Resp Panel by RT-PCR (Flu A&B, Covid) Nasopharyngeal Swab     Status: None   Collection Time: 05/04/20  5:14 PM   Specimen: Nasopharyngeal Swab; Nasopharyngeal(NP) swabs in vial transport medium  Result Value Ref Range   SARS Coronavirus 2 by RT PCR NEGATIVE NEGATIVE    Comment: (NOTE) SARS-CoV-2 target nucleic acids are NOT DETECTED.  The SARS-CoV-2 RNA is generally detectable in upper respiratory specimens during the acute phase of infection. The lowest concentration of SARS-CoV-2 viral copies this assay can detect is 138 copies/mL. A negative result does not preclude SARS-Cov-2 infection and should not be used as the sole basis for treatment or other patient management decisions. A  negative result may occur with  improper specimen collection/handling, submission  of specimen other than nasopharyngeal swab, presence of viral mutation(s) within the areas targeted by this assay, and inadequate number of viral copies(<138 copies/mL). A negative result must be combined with clinical observations, patient history, and epidemiological information. The expected result is Negative.  Fact Sheet for Patients:  BloggerCourse.com  Fact Sheet for Healthcare Providers:  SeriousBroker.it  This test is no t yet approved or cleared by the [place] FDA and  has been authorized for detection and/or diagnosis of SARS-CoV-2 by FDA under an Emergency Use Authorization (EUA). This EUA will remain  in effect (meaning this test can be used) for the duration of the COVID-19 declaration under Section 564(b)(1) of the Act, 21 U.S.C.section 360bbb-3(b)(1), unless the authorization is terminated  or revoked sooner.       Influenza A by PCR NEGATIVE NEGATIVE   Influenza B by PCR NEGATIVE NEGATIVE    Comment: (NOTE) The Xpert Xpress SARS-CoV-2/FLU/RSV plus assay is intended as an aid in the diagnosis of influenza from Nasopharyngeal swab specimens and should not be used as a sole basis for treatment. Nasal washings and aspirates are unacceptable for Xpert Xpress SARS-CoV-2/FLU/RSV testing.  Fact Sheet for Patients: BloggerCourse.com  Fact Sheet for Healthcare Providers: SeriousBroker.it  This test is not yet approved or cleared by the [place] FDA and has been authorized for detection and/or diagnosis of SARS-CoV-2 by FDA under an Emergency Use Authorization (EUA). This EUA will remain in effect (meaning this test can be used) for the duration of the COVID-19 declaration under Section 564(b)(1) of the Act, 21 U.S.C. section 360bbb-3(b)(1), unless the authorization is terminated  or revoked.  Performed at [facility], [address], [place], [place] [address]   I-Stat venous blood gas, ED     Status: Abnormal   Collection Time: 05/04/20  5:23 PM  Result Value Ref Range   pH, Ven 7.355 7.250 - 7.430   pCO2, Ven 54.1 44.0 - 60.0 mmHg   pO2, Ven 85.0 (H) 32.0 - 45.0 mmHg   Bicarbonate 30.2 (H) 20.0 - 28.0 mmol/L   TCO2 32 22 - 32 mmol/L   O2 Saturation 96.0 %   Acid-Base Excess 3.0 (H) 0.0 - 2.0 mmol/L   Sodium 141 135 - 145 mmol/L   Potassium 4.5 3.5 - 5.1 mmol/L   Calcium, Ion 1.19 1.15 - 1.40 mmol/L   HCT 46.0 36.0 - 46.0 %   Hemoglobin 15.6 (H) 12.0 - 15.0 g/dL   Sample type VENOUS   Brain natriuretic peptide     Status: Abnormal   Collection Time: 05/04/20  9:49 PM  Result Value Ref Range   B Natriuretic Peptide 256.0 (H) 0.0 - 100.0 pg/mL    Comment: Performed at [facility], [address] [place], [place] [address]  Basic metabolic panel     Status: Abnormal   Collection Time: 05/05/20 12:38 AM  Result Value Ref Range   Sodium 139 135 - 145 mmol/L   Potassium 3.5 3.5 - 5.1 mmol/L   Chloride 103 98 - 111 mmol/L   CO2 32 22 - 32 mmol/L   Glucose, Bld 131 (H) 70 - 99 mg/dL    Comment: Glucose reference range applies only to samples taken after fasting for at least 8 hours.   BUN 16 8 - 23 mg/dL   Creatinine, Ser 3.23 0.44 - 1.00 mg/dL   Calcium 8.9 8.9 - 55.7 mg/dL   GFR, Estimated >32 >20 mL/min    Comment: (NOTE) Calculated using  the CKD-EPI Creatinine Equation (2021)    Anion gap 4 (L) 5 - 15    Comment: Performed at [facility], [address] [place], [place] [address]  CBC WITH DIFFERENTIAL     Status: None   Collection Time: 05/05/20 12:38 AM  Result Value Ref Range   WBC 8.3 4.0 - 10.5 K/uL   RBC 4.66 3.87 - 5.11 MIL/uL   Hemoglobin 14.6 12.0 - 15.0 g/dL   HCT 60.444.6 54.036.0 - 98.146.0 %   MCV 95.7 80.0 - 100.0 fL   MCH 31.3 26.0 - 34.0 pg   MCHC 32.7 30.0 - 36.0 g/dL   RDW 19.114.3 47.811.5 - 29.515.5 %   Platelets 211 150  - 400 K/uL   nRBC 0.0 0.0 - 0.2 %   Neutrophils Relative % 76 %   Neutro Abs 6.4 1.7 - 7.7 K/uL   Lymphocytes Relative 13 %   Lymphs Abs 1.0 0.7 - 4.0 K/uL   Monocytes Relative 8 %   Monocytes Absolute 0.6 0.1 - 1.0 K/uL   Eosinophils Relative 2 %   Eosinophils Absolute 0.2 0.0 - 0.5 K/uL   Basophils Relative 1 %   Basophils Absolute 0.0 0.0 - 0.1 K/uL   Immature Granulocytes 0 %   Abs Immature Granulocytes 0.01 0.00 - 0.07 K/uL    Comment: Performed at [facility], [address] [place], [place] [address]  TSH     Status: None   Collection Time: 05/05/20 12:38 AM  Result Value Ref Range   TSH 2.941 0.350 - 4.500 uIU/mL    Comment: Performed by a 3rd Generation assay with a functional sensitivity of <=0.01 uIU/mL. Performed at [facility], [address] [place], [place] [address]   Blood gas, venous     Status: Abnormal   Collection Time: 05/05/20 12:38 AM  Result Value Ref Range   pH, Ven 7.380 7.250 - 7.430   pCO2, Ven 55.1 44.0 - 60.0 mmHg   pO2, Ven 59.7 (H) 32.0 - 45.0 mmHg   Bicarbonate 31.9 (H) 20.0 - 28.0 mmol/L   Acid-Base Excess 6.8 (H) 0.0 - 2.0 mmol/L   O2 Saturation 91.5 %   Patient temperature 37.0     Comment: Performed at [facility], [address] [place], [place] [address]  MRSA PCR Screening     Status: None   Collection Time: 05/05/20 12:45 AM   Specimen: Nasal Mucosa; Nasopharyngeal  Result Value Ref Range   MRSA by PCR NEGATIVE NEGATIVE    Comment:        The GeneXpert MRSA Assay (FDA approved for NASAL specimens only), is one component of a comprehensive MRSA colonization surveillance program. It is not intended to diagnose MRSA infection nor to guide or monitor treatment for MRSA infections. Performed at [facility], [address] [place], [place] [address]   Troponin I (High Sensitivity)     Status: Abnormal   Collection Time: 05/05/20  8:57 AM  Result Value Ref Range   Troponin I (High Sensitivity) 34  (H) <18 ng/L    Comment: (NOTE) Elevated high sensitivity troponin I (hsTnI) values and significant  changes across serial measurements may suggest ACS but many other  chronic and acute conditions are known to elevate hsTnI results.  Refer to the "Links" section for chest pain algorithms and additional  guidance. Performed at [facility], [address] [place], [place] [address]   Troponin I (High  Sensitivity)     Status: Abnormal   Collection Time: 05/05/20  3:12 PM  Result Value Ref Range   Troponin I (High Sensitivity) 30 (H) <18 ng/L    Comment: (NOTE) Elevated high sensitivity troponin I (hsTnI) values and significant  changes across serial measurements may suggest ACS but many other  chronic and acute conditions are known to elevate hsTnI results.  Refer to the "Links" section for chest pain algorithms and additional  guidance. Performed at [facility], [address], [place], [place] [address]   Troponin I (High Sensitivity)     Status: Abnormal   Collection Time: 05/05/20  6:00 PM  Result Value Ref Range   Troponin I (High Sensitivity) 29 (H) <18 ng/L    Comment: (NOTE) Elevated high sensitivity troponin I (hsTnI) values and significant  changes across serial measurements may suggest ACS but many other  chronic and acute conditions are known to elevate hsTnI results.  Refer to the "Links" section for chest pain algorithms and additional  guidance. Performed at [facility], [address], [place], [place] [address]   Basic metabolic panel     Status: Abnormal   Collection Time: 05/06/20  1:25 AM  Result Value Ref Range   Sodium 139 135 - 145 mmol/L   Potassium 3.3 (L) 3.5 - 5.1 mmol/L   Chloride 97 (L) 98 - 111 mmol/L   CO2 35 (H) 22 - 32 mmol/L   Glucose, Bld 114 (H) 70 - 99 mg/dL    Comment: Glucose reference range applies only to samples taken after fasting for at least 8 hours.   BUN 21 8 - 23 mg/dL   Creatinine, Ser 0.98 (H) 0.44 -  1.00 mg/dL   Calcium 8.9 8.9 - 11.9 mg/dL   GFR, Estimated 57 (L) >60 mL/min    Comment: (NOTE) Calculated using the CKD-EPI Creatinine Equation (2021)    Anion gap 7 5 - 15    Comment: Performed at [facility], [address] [place], [place] [address]   DG Chest Port 1 View  Result Date: 05/04/2020 CLINICAL DATA:  Dyspnea, shortness of breath EXAM: PORTABLE CHEST 1 VIEW COMPARISON:  11/19/2015 FINDINGS: Cardiomegaly. Mild, diffuse bilateral interstitial pulmonary opacity. Possible layering small pleural effusions. The visualized skeletal structures are unremarkable. IMPRESSION: Cardiomegaly with mild, diffuse bilateral interstitial pulmonary opacity and possible layering small pleural effusions. Findings are most consistent with edema. Electronically Signed   By: Lauralyn Primes M.D.   On: 05/04/2020 17:59   ECHOCARDIOGRAM COMPLETE  Result Date: 05/05/2020    ECHOCARDIOGRAM REPORT   Patient Name:   LETONIA STEAD Date of Exam: 05/05/2020 Medical Rec #:  [id number]        Height:       67.0 in Accession #:    [id number]       Weight:       327.4 lb Date of Birth:  [birth year]        BSA:          2.493 m Patient Age:    74 years         BP:           132/68 mmHg Patient Gender: F                HR:           91 bpm. Exam Location:  Inpatient Procedure: 2D Echo Indications:    CHF-Acute Diastolic I50.31  History:  Patient has no prior history of Echocardiogram examinations.                 Risk Factors:Hypertension.  Sonographer:    Thurman Coyer RDCS (AE) Referring Phys: [id number] MOHAMMAD L GARBA IMPRESSIONS  1. Left ventricular ejection fraction, by estimation, is 35 to 40%. The left ventricle has moderately decreased function. Left ventricular endocardial border not optimally defined to evaluate regional wall motion. The left ventricular internal cavity size was mildly dilated. Left ventricular diastolic parameters are consistent with Grade II diastolic dysfunction (pseudonormalization).   2. Right ventricular systolic function is mildly reduced. The right ventricular size is normal.  3. The mitral valve is grossly normal. Trivial mitral valve regurgitation.  4. The aortic valve is grossly normal. Aortic valve regurgitation is not visualized. FINDINGS  Left Ventricle: Left ventricular ejection fraction, by estimation, is 35 to 40%. The left ventricle has moderately decreased function. Left ventricular endocardial border not optimally defined to evaluate regional wall motion. The left ventricular internal cavity size was mildly dilated. There is no left ventricular hypertrophy. Left ventricular diastolic parameters are consistent with Grade II diastolic dysfunction (pseudonormalization). Right Ventricle: The right ventricular size is normal. Right vetricular wall thickness was not well visualized. Right ventricular systolic function is mildly reduced. Left Atrium: Left atrial size was normal in size. Right Atrium: Right atrial size was normal in size. Pericardium: Trivial pericardial effusion is present. Mitral Valve: The mitral valve is grossly normal. Trivial mitral valve regurgitation. Tricuspid Valve: The tricuspid valve is not well visualized. Tricuspid valve regurgitation is trivial. Aortic Valve: The aortic valve is grossly normal. Aortic valve regurgitation is not visualized. Pulmonic Valve: The pulmonic valve was not well visualized. Pulmonic valve regurgitation is trivial. Aorta: The aortic root and ascending aorta are structurally normal, with no evidence of dilitation. IAS/Shunts: The atrial septum is grossly normal.  LEFT VENTRICLE PLAX 2D LVIDd:         6.10 cm  Diastology LVIDs:         5.00 cm  LV e' medial:    6.05 cm/s LV PW:         1.00 cm  LV E/e' medial:  15.9 LV IVS:        1.00 cm  LV e' lateral:   6.80 cm/s LVOT diam:     2.30 cm  LV E/e' lateral: 14.1 LV SV:         45 LV SV Index:   18 LVOT Area:     4.15 cm  RIGHT VENTRICLE RV S prime:     10.70 cm/s TAPSE (M-mode): 1.5 cm  LEFT ATRIUM             Index       RIGHT ATRIUM           Index LA diam:        4.20 cm 1.68 cm/m  RA Area:     14.90 cm LA Vol (A2C):   86.0 ml 34.49 ml/m RA Volume:   34.40 ml  13.80 ml/m LA Vol (A4C):   60.3 ml 24.18 ml/m LA Biplane Vol: 73.0 ml 29.28 ml/m  AORTIC VALVE LVOT Vmax:   55.50 cm/s LVOT Vmean:  36.700 cm/s LVOT VTI:    0.109 m  AORTA Ao Root diam: 3.60 cm MITRAL VALVE MV Area (PHT): 4.12 cm    SHUNTS MV Decel Time: 184 msec    Systemic VTI:  0.11 m MV E velocity: 96.00 cm/s  Systemic Diam: 2.30  cm MV A velocity: 49.00 cm/s MV E/A ratio:  1.96 Kristeen Miss MD Electronically signed by Kristeen Miss MD Signature Date/Time: 05/05/2020/4:17:02 PM    Final     Review Of Systems Constitutional: No fever, chills, Chronic weight gain. Eyes: No vision change, wears glasses. No discharge or pain. Ears: No hearing loss, No tinnitus. Respiratory: No asthma, COPD, pneumonias. Positive shortness of breath. No hemoptysis. Cardiovascular: Positive chest pain, palpitation, leg edema. Gastrointestinal: No nausea, vomiting, diarrhea, constipation. No GI bleed. No hepatitis. Genitourinary: No dysuria, hematuria, kidney stone. No incontinance. Neurological: No headache, stroke, seizures.  Psychiatry: No psych facility admission for anxiety, depression, suicide. No detox. Skin: No rash. Musculoskeletal: Positive joint pain, no fibromyalgia. Positive neck pain, back pain. Lymphadenopathy: No lymphadenopathy. Hematology: No anemia or easy bruising.   Blood pressure 112/61, pulse 80, temperature (!) 97.3 F (36.3 C), temperature source Oral, resp. rate 18, height 5\' 7"  (1.702 m), weight (!) 145.1 kg, SpO2 90 %. Body mass index is 50.1 kg/m. General appearance: alert, cooperative, appears stated age and significant respiratory distress Head: Normocephalic, atraumatic. Eyes: Blue eyes, pink conjunctiva, corneas clear.  Neck: No adenopathy, no carotid bruit, Positive JVD, supple, symmetrical,  trachea midline and thyroid not enlarged. Resp: Clear to auscultation bilaterally. Cardio: Regular rate and rhythm, S1, S2 normal, III/VI systolic murmur, no click, rub or gallop GI: Soft, non-tender; bowel sounds normal; no organomegaly. Extremities: 2 + edema, no cyanosis or clubbing. Skin: Warm and dry.  Neurologic: Alert and oriented X 3, normal strength.  Assessment/Plan Acute systolic left heart failure, HFrEF R/O CAD HTN HLD Morbid obesity Hypokalemia  Agree with diuresis and potassium replacement.. Discontinue amlodipine as BP is lower. Add small dose Losartan and carvedilol. Consider R and L heart catheterization in near future post additional diuresis. Magnesium level is pending.  Time spent: Review of old records, Lab, x-rays, EKG, other cardiac tests, examination, discussion with patient/Nurse and doctor over 70 minutes.  Ricki Rodriguez, MD  05/06/2020, 9:46 AM

## 2020-05-06 NOTE — Progress Notes (Signed)
Heart Failure Stewardship Pharmacist Progress Note   PCP: [name], MD PCP-Cardiologist: No primary care provider on file.    HPI:  75 yo F with PMH of osteoarthritis, HLD, GERD, chronic lymphedema, and depression. She presented to the ED on 05/04/20 with shortness of breath. An ECHO was done on 05/05/20 and LVEF was reduced to 35-40% with moderate MR and severe TR.   Current HF Medications: Furosemide 40 mg IV daily Carvedilol 3.125 mg BID Losartan 25 mg daily  Prior to admission HF Medications: None  Pertinent Lab Values: . Serum creatinine 1.03, BUN 21, Potassium 3.3, Sodium 139, BNP 256  Vital Signs: . Weight: 319 lbs (admission weight: 327 lbs) . Blood pressure: 120/60s  . Heart rate: 80s   Medication Assistance / Insurance Benefits Check: Does the patient have prescription insurance?  Yes Type of insurance plan: BCBS Medicare  Does the patient qualify for medication assistance through manufacturers or grants?   Pending . Eligible grants and/or patient assistance programs: [place] . Medication assistance applications in progress: none  . Medication assistance applications approved: none Approved medication assistance renewals will be completed by: pending  Outpatient Pharmacy:  Prior to admission outpatient pharmacy: [facility] Is the patient willing to use [facility] TOC pharmacy at discharge? Yes Is the patient willing to transition their outpatient pharmacy to utilize a [facility] outpatient pharmacy?   Pending    Assessment: 1. Acute systolic CHF (EF 42-68%), due to unknown etiology. NYHA class II/III symptoms. - Continue furosemide 40 mg IV daily. Potassium replaced. Check magnesium in AM - Agree with starting carvedilol 3.125 mg BID - Agree with starting losartan 25 mg daily - consider optimizing to Entresto 24/26 mg BID prior to discharge - Consider starting spironolactone 12.5 mg daily prior to discharge - Consider starting Jardiance 10 mg daily  prior to discharge - Consider obtaining R/LHC once more euvolemic   Plan: 1) Medication changes recommended at this time: - Agree with changes as above  2) Patient assistance: - Entresto copay $37 per month - Marcelline Deist is not covered on patient's insurance formulary - Jardiance copay $37 per month  3)  Education  - To be completed prior to discharge  Sharen Hones, PharmD, BCPS Heart Failure Engineer, building services Phone [phone]

## 2020-05-07 ENCOUNTER — Encounter (HOSPITAL_COMMUNITY)
Admission: EM | Disposition: A | Discharge status: Home / Self Care | Payer: Self-pay | Source: Home / Self Care | Attending: Internal Medicine

## 2020-05-07 DIAGNOSIS — N1831 Chronic kidney disease, stage 3a: Secondary | ICD-10-CM | POA: Diagnosis not present

## 2020-05-07 DIAGNOSIS — I509 Heart failure, unspecified: Secondary | ICD-10-CM | POA: Diagnosis not present

## 2020-05-07 DIAGNOSIS — F419 Anxiety disorder, unspecified: Secondary | ICD-10-CM | POA: Diagnosis not present

## 2020-05-07 HISTORY — PX: RIGHT/LEFT HEART CATH AND CORONARY ANGIOGRAPHY: CATH118266

## 2020-05-07 LAB — POCT I-STAT EG7
Acid-Base Excess: 6 mmol/L — ABNORMAL HIGH (ref 0.0–2.0)
Bicarbonate: 35.1 mmol/L — ABNORMAL HIGH (ref 20.0–28.0)
Calcium, Ion: 1.22 mmol/L (ref 1.15–1.40)
HCT: 41 % (ref 36.0–46.0)
Hemoglobin: 13.9 g/dL (ref 12.0–15.0)
O2 Saturation: 72 %
Potassium: 4 mmol/L (ref 3.5–5.1)
Sodium: 139 mmol/L (ref 135–145)
TCO2: 37 mmol/L — ABNORMAL HIGH (ref 22–32)
pCO2, Ven: 70.9 mmHg (ref 44.0–60.0)
pH, Ven: 7.302 (ref 7.250–7.430)
pO2, Ven: 44 mmHg (ref 32.0–45.0)

## 2020-05-07 LAB — COMPREHENSIVE METABOLIC PANEL
ALT: 20 U/L (ref 0–44)
AST: 26 U/L (ref 15–41)
Albumin: 3.2 g/dL — ABNORMAL LOW (ref 3.5–5.0)
Alkaline Phosphatase: 102 U/L (ref 38–126)
Anion gap: 5 (ref 5–15)
BUN: 25 mg/dL — ABNORMAL HIGH (ref 8–23)
CO2: 35 mmol/L — ABNORMAL HIGH (ref 22–32)
Calcium: 8.4 mg/dL — ABNORMAL LOW (ref 8.9–10.3)
Chloride: 95 mmol/L — ABNORMAL LOW (ref 98–111)
Creatinine, Ser: 1.03 mg/dL — ABNORMAL HIGH (ref 0.44–1.00)
GFR, Estimated: 57 mL/min — ABNORMAL LOW (ref >60)
Glucose, Bld: 93 mg/dL (ref 70–99)
Potassium: 4.2 mmol/L (ref 3.5–5.1)
Sodium: 135 mmol/L (ref 135–145)
Total Bilirubin: 1.4 mg/dL — ABNORMAL HIGH (ref 0.3–1.2)
Total Protein: 5.4 g/dL — ABNORMAL LOW (ref 6.5–8.1)

## 2020-05-07 LAB — POCT I-STAT 7, (LYTES, BLD GAS, ICA,H+H)
Acid-Base Excess: 5 mmol/L — ABNORMAL HIGH (ref 0.0–2.0)
Bicarbonate: 33.5 mmol/L — ABNORMAL HIGH (ref 20.0–28.0)
Calcium, Ion: 1.18 mmol/L (ref 1.15–1.40)
HCT: 41 % (ref 36.0–46.0)
Hemoglobin: 13.9 g/dL (ref 12.0–15.0)
O2 Saturation: 99 %
Potassium: 3.9 mmol/L (ref 3.5–5.1)
Sodium: 139 mmol/L (ref 135–145)
TCO2: 35 mmol/L — ABNORMAL HIGH (ref 22–32)
pCO2 arterial: 65.2 mmHg (ref 32.0–48.0)
pH, Arterial: 7.319 — ABNORMAL LOW (ref 7.350–7.450)
pO2, Arterial: 138 mmHg — ABNORMAL HIGH (ref 83.0–108.0)

## 2020-05-07 LAB — MAGNESIUM: Magnesium: 2.2 mg/dL (ref 1.7–2.4)

## 2020-05-07 LAB — PHOSPHORUS: Phosphorus: 3.9 mg/dL (ref 2.5–4.6)

## 2020-05-07 SURGERY — RIGHT/LEFT HEART CATH AND CORONARY ANGIOGRAPHY
Anesthesia: LOCAL

## 2020-05-07 MED ORDER — MIDAZOLAM HCL 2 MG/2ML IJ SOLN
INTRAMUSCULAR | Status: DC | PRN
  Administered 2020-05-07 (×2): 1 mg via INTRAVENOUS

## 2020-05-07 MED ORDER — FENTANYL CITRATE (PF) 100 MCG/2ML IJ SOLN
INTRAMUSCULAR | Status: DC | PRN
  Administered 2020-05-07 (×2): 25 ug via INTRAVENOUS

## 2020-05-07 MED ORDER — LIDOCAINE HCL (PF) 1 % IJ SOLN
INTRAMUSCULAR | Status: DC | PRN
  Administered 2020-05-07: 2 mL

## 2020-05-07 MED ORDER — MIDAZOLAM HCL 2 MG/2ML IJ SOLN
INTRAMUSCULAR | Status: AC
  Filled 2020-05-07: qty 2

## 2020-05-07 MED ORDER — VERAPAMIL HCL 2.5 MG/ML IV SOLN
INTRAVENOUS | Status: DC | PRN
  Administered 2020-05-07: 10 mL via INTRA_ARTERIAL

## 2020-05-07 MED ORDER — SODIUM CHLORIDE 0.9% FLUSH
3.0000 mL | Freq: Two times a day (BID) | INTRAVENOUS | Status: DC
  Administered 2020-05-07: 3 mL via INTRAVENOUS

## 2020-05-07 MED ORDER — LIDOCAINE HCL (PF) 1 % IJ SOLN
INTRAMUSCULAR | Status: AC
  Filled 2020-05-07: qty 30

## 2020-05-07 MED ORDER — FENTANYL CITRATE (PF) 100 MCG/2ML IJ SOLN
INTRAMUSCULAR | Status: AC
  Filled 2020-05-07: qty 2

## 2020-05-07 MED ORDER — SODIUM CHLORIDE 0.9 % IV SOLN: 250.0000 mL | INTRAVENOUS | Status: DC | PRN

## 2020-05-07 MED ORDER — ASPIRIN 81 MG PO CHEW
81.0000 mg | CHEWABLE_TABLET | Freq: Once | ORAL | Status: AC
  Administered 2020-05-07: 81 mg via ORAL
  Filled 2020-05-07: qty 1

## 2020-05-07 MED ORDER — SODIUM CHLORIDE 0.9% FLUSH
3.0000 mL | Freq: Two times a day (BID) | INTRAVENOUS | Status: DC
  Administered 2020-05-07 – 2020-05-08 (×2): 3 mL via INTRAVENOUS

## 2020-05-07 MED ORDER — HEPARIN (PORCINE) IN NACL 1000-0.9 UT/500ML-% IV SOLN
INTRAVENOUS | Status: DC | PRN
  Administered 2020-05-07 (×2): 500 mL

## 2020-05-07 MED ORDER — HEPARIN (PORCINE) IN NACL 1000-0.9 UT/500ML-% IV SOLN
INTRAVENOUS | Status: AC
  Filled 2020-05-07: qty 1000

## 2020-05-07 MED ORDER — SODIUM CHLORIDE 0.9% FLUSH: 3.0000 mL | INTRAVENOUS | Status: DC | PRN

## 2020-05-07 MED ORDER — IOHEXOL 350 MG/ML SOLN
INTRAVENOUS | Status: DC | PRN
  Administered 2020-05-07: 50 mL

## 2020-05-07 MED ORDER — SODIUM CHLORIDE 0.9 % IV SOLN: INTRAVENOUS | Status: DC

## 2020-05-07 MED ORDER — VERAPAMIL HCL 2.5 MG/ML IV SOLN
INTRAVENOUS | Status: AC
  Filled 2020-05-07: qty 2

## 2020-05-07 MED ORDER — HEPARIN SODIUM (PORCINE) 1000 UNIT/ML IJ SOLN
INTRAMUSCULAR | Status: DC | PRN
  Administered 2020-05-07: 7500 [IU] via INTRAVENOUS

## 2020-05-07 SURGICAL SUPPLY — 9 items
CATH 5FR JL3.5 JR4 ANG PIG MP (CATHETERS) ×2
CATH SWAN GANZ 7F STRAIGHT (CATHETERS) ×2
DEVICE RAD COMP TR BAND LRG (VASCULAR PRODUCTS) ×2
GLIDESHEATH SLEND SS 6F .021 (SHEATH) ×4
INQWIRE 1.5J .035X260CM (WIRE) ×2
KIT HEART LEFT (KITS) ×2
MAT PREVALON FULL STRYKER (MISCELLANEOUS) ×2
PACK CARDIAC CATHETERIZATION (CUSTOM PROCEDURE TRAY) ×2
TRANSDUCER W/STOPCOCK (MISCELLANEOUS) ×4

## 2020-05-07 NOTE — Progress Notes (Signed)
PROGRESS NOTE    Paige [name]  FXO:[id number] DOB: [birth year] DOA: 05/04/2020 PCP: [name], MD     Brief Narrative:  This 75 years old WF PMHx osteoarthritis, hyperlipidemia, GERD, depression, chronic lymphedema of both lower extremities, restless leg syndrome and vitamin B12 deficiency   Presents in the ER with significant shortness of breath and hypoxia.  Patient reports progressive worsening of shortness of breath for last 2 weeks.  She report having shortness of breath on exertion but now happening even at rest.  Patient was severely hypoxic on arrival O2 saturation was 80% on RA requiring nonrebreather at 15 L.  She was significantly wheezing and was given Lasix and breathing treatment.  Chest x-ray shows findings consistent with pulmonary edema.  BNP 256,  Covid and influenza negative.  Patient is admitted for new onset CHF exacerbation.   Subjective: 4/14 afebrile overnight A/O x4, negative CP, negative SOB.  Patient waiting to go down for cardiac catheterization.   Assessment & Plan: Covid vaccination; vaccinated 3/3   Principal Problem:   Acute exacerbation of CHF (congestive heart failure) (HCC) Active Problems:   Arthritis of knee   Class 3 severe obesity with serious comorbidity and body mass index (BMI) of 45.0 to 49.9 in adult [facility])   Anxiety disorder   Chronic kidney disease, stage 3a (HCC)   Esophageal reflux   High cholesterol  Acute respiratory failure with hypoxia -Most likely secondary to acute systolic CHF -Titrate O2 to maintain SPO2> 92% -Continuous pulse ox  Acute systolic and diastolic CHF -EF 30 to 40%.  See results below -Strict in and out -4.1 L -Daily weight Filed Weights   05/06/20 0400 05/07/20 0500 05/07/20 1258  Weight: (!) 145.1 kg (!) 146.2 kg (!) 145.9 kg  -Discussed case with Dr. Algie Coffer cardiology who will perform cardiac catheterization either on 4/14 or 4/15. -Coreg 3.125 mg BID -Lasix IV 40 mg BID -Hydralazine  PRN -Losartan 25 mg daily -4/14 scheduled for cardiac catheterization today  Elevated troponin -Patient with new onset systolic and diastolic CHF most likely secondary to MI. Results for ZEPHYRA, GRISMORE (MRN [id number]) as of 05/06/2020 10:16  Ref. Range 05/05/2020 08:57 05/05/2020 13:59 05/05/2020 15:12 05/05/2020 18:00  Troponin I (High Sensitivity) Latest Ref Range: <18 ng/L 34 (H)  30 (H) 29 (H)  -Trending down   Essential HTN -See CHF   Hyperlipidemia: -4/14 lipid panel pending - Dependent upon LDL will start appropriate level of statin  Anxiety disorder: -Wellbutrin 150 mg daily -Paxil 40 mg daily -Trazodone 150 mg daily  Restless leg syndrome: -Pramipexole 0.25 mg BID.    Morbid obesity (Body mass index is 50.1 kg/m). -Counseled in detail about diet and exercise. -Outpatient PCP follow-up  Hypokalemia -Potassium goal> 4           DVT prophylaxis: Lovenox Code Status: DNR Family Communication:  Status is: Inpatient    Dispo: The patient is from: Home              Anticipated d/c is to: Home              Anticipated d/c date is: 4/17              Patient currently unstable      Consultants:  Dr. Algie Coffer cardiology   Procedures/Significant Events:  4/12 Echocardiogram;Left Ventricle: LVEF=35 to 40%. Moderately decreased function. -Left ventricular diastolic parameters are consistent with Grade II diastolic dysfunction (pseudonormalization).    I have personally reviewed and interpreted  all radiology studies and my findings are as above.  VENTILATOR SETTINGS: Nasal cannula 4/14 Flow 2 L/min SPO2 95%   Cultures   Antimicrobials:    Devices    LINES / TUBES:      Continuous Infusions: . sodium chloride       Objective: Vitals:   05/07/20 0000 05/07/20 0347 05/07/20 0500 05/07/20 0800  BP: 92/60 (!) 116/54  98/60  Pulse: 77 81  70  Resp: 18 20  17   Temp:  98.4 F (36.9 C)  98.5 F (36.9 C)  TempSrc:  Oral  Oral   SpO2: 93% 97%  95%  Weight:   (!) 146.2 kg   Height:        Intake/Output Summary (Last 24 hours) at 05/07/2020 0853 Last data filed at 05/06/2020 1000 Gross per 24 hour  Intake 3 ml  Output --  Net 3 ml   Filed Weights   05/05/20 0401 05/06/20 0400 05/07/20 0500  Weight: (!) 148.5 kg (!) 145.1 kg (!) 146.2 kg    Examination:  General: A/O x4, positive acute respiratory distress Eyes: negative scleral hemorrhage, negative anisocoria, negative icterus ENT: Negative Runny nose, negative gingival bleeding, Neck:  Negative scars, masses, torticollis, lymphadenopathy, JVD Lungs: Clear to auscultation bilaterally without wheezes or crackles Cardiovascular: Regular rate and rhythm without murmur gallop or rub normal S1 and S2 Abdomen: MORBIDLY OBESE, negative abdominal pain, nondistended, positive soft, bowel sounds, no rebound, no ascites, no appreciable mass Extremities: No significant cyanosis, clubbing.  Bilateral lower extremity edema 2-3+ entheses patient states has had lower extremity edema for years)  Skin: Negative rashes, lesions, ulcers Psychiatric:  Negative depression, negative anxiety, negative fatigue, negative mania  Central nervous system:  Cranial nerves II through XII intact, tongue/uvula midline, all extremities muscle strength 5/5, sensation intact throughout, negative dysarthria, negative expressive aphasia, negative receptive aphasia.  .     Data Reviewed: Care during the described time interval was provided by me .  I have reviewed this patient's available data, including medical history, events of note, physical examination, and all test results as part of my evaluation.  CBC: Recent Labs  Lab 05/04/20 1714 05/04/20 1723 05/05/20 0038  WBC 8.0  --  8.3  NEUTROABS 6.2  --  6.4  HGB 15.2* 15.6* 14.6  HCT 47.2* 46.0 44.6  MCV 95.7  --  95.7  PLT 225  --  211   Basic Metabolic Panel: Recent Labs  Lab 05/04/20 1714 05/04/20 1723 05/05/20 0038  05/06/20 0125 05/07/20 0017  NA 139 141 139 139 135  K 4.3 4.5 3.5 3.3* 4.2  CL 103  --  103 97* 95*  CO2 29  --  32 35* 35*  GLUCOSE 99  --  131* 114* 93  BUN 19  --  16 21 25*  CREATININE 0.93  --  0.96 1.03* 1.03*  CALCIUM 9.1  --  8.9 8.9 8.4*  MG  --   --   --   --  2.2  PHOS  --   --   --   --  3.9   GFR: Estimated Creatinine Clearance: 72.2 mL/min (A) (by C-G formula based on SCr of 1.03 mg/dL (H)). Liver Function Tests: Recent Labs  Lab 05/07/20 0017  AST 26  ALT 20  ALKPHOS 102  BILITOT 1.4*  PROT 5.4*  ALBUMIN 3.2*   No results for input(s): LIPASE, AMYLASE in the last 168 hours. No results for input(s): AMMONIA in the last 168 hours.  Coagulation Profile: No results for input(s): INR, PROTIME in the last 168 hours. Cardiac Enzymes: No results for input(s): CKTOTAL, CKMB, CKMBINDEX, TROPONINI in the last 168 hours. BNP (last 3 results) No results for input(s): PROBNP in the last 8760 hours. HbA1C: No results for input(s): HGBA1C in the last 72 hours. CBG: No results for input(s): GLUCAP in the last 168 hours. Lipid Profile: Recent Labs    05/06/20 0125  CHOL 141  HDL 44  LDLCALC 85  TRIG 62  CHOLHDL 3.2   Thyroid Function Tests: Recent Labs    05/05/20 0038  TSH 2.941   Anemia Panel: No results for input(s): VITAMINB12, FOLATE, FERRITIN, TIBC, IRON, RETICCTPCT in the last 72 hours. Sepsis Labs: No results for input(s): PROCALCITON, LATICACIDVEN in the last 168 hours.  Recent Results (from the past 240 hour(s))  Resp Panel by RT-PCR (Flu A&B, Covid) Nasopharyngeal Swab     Status: None   Collection Time: 05/04/20  5:14 PM   Specimen: Nasopharyngeal Swab; Nasopharyngeal(NP) swabs in vial transport medium  Result Value Ref Range Status   SARS Coronavirus 2 by RT PCR NEGATIVE NEGATIVE Final    Comment: (NOTE) SARS-CoV-2 target nucleic acids are NOT DETECTED.  The SARS-CoV-2 RNA is generally detectable in upper respiratory specimens during the  acute phase of infection. The lowest concentration of SARS-CoV-2 viral copies this assay can detect is 138 copies/mL. A negative result does not preclude SARS-Cov-2 infection and should not be used as the sole basis for treatment or other patient management decisions. A negative result may occur with  improper specimen collection/handling, submission of specimen other than nasopharyngeal swab, presence of viral mutation(s) within the areas targeted by this assay, and inadequate number of viral copies(<138 copies/mL). A negative result must be combined with clinical observations, patient history, and epidemiological information. The expected result is Negative.  Fact Sheet for Patients:  BloggerCourse.com  Fact Sheet for Healthcare Providers:  SeriousBroker.it  This test is no t yet approved or cleared by the [place] FDA and  has been authorized for detection and/or diagnosis of SARS-CoV-2 by FDA under an Emergency Use Authorization (EUA). This EUA will remain  in effect (meaning this test can be used) for the duration of the COVID-19 declaration under Section 564(b)(1) of the Act, 21 U.S.C.section 360bbb-3(b)(1), unless the authorization is terminated  or revoked sooner.       Influenza A by PCR NEGATIVE NEGATIVE Final   Influenza B by PCR NEGATIVE NEGATIVE Final    Comment: (NOTE) The Xpert Xpress SARS-CoV-2/FLU/RSV plus assay is intended as an aid in the diagnosis of influenza from Nasopharyngeal swab specimens and should not be used as a sole basis for treatment. Nasal washings and aspirates are unacceptable for Xpert Xpress SARS-CoV-2/FLU/RSV testing.  Fact Sheet for Patients: BloggerCourse.com  Fact Sheet for Healthcare Providers: SeriousBroker.it  This test is not yet approved or cleared by the [place] FDA and has been authorized for detection and/or  diagnosis of SARS-CoV-2 by FDA under an Emergency Use Authorization (EUA). This EUA will remain in effect (meaning this test can be used) for the duration of the COVID-19 declaration under Section 564(b)(1) of the Act, 21 U.S.C. section 360bbb-3(b)(1), unless the authorization is terminated or revoked.  Performed at [facility], [address] [place], [place] [address]   MRSA PCR Screening     Status: None   Collection Time: 05/05/20 12:45 AM   Specimen: Nasal Mucosa; Nasopharyngeal  Result Value Ref Range Status  MRSA by PCR NEGATIVE NEGATIVE Final    Comment:        The GeneXpert MRSA Assay (FDA approved for NASAL specimens only), is one component of a comprehensive MRSA colonization surveillance program. It is not intended to diagnose MRSA infection nor to guide or monitor treatment for MRSA infections. Performed at [facility], [address] [place], [place] [address]          Radiology Studies: ECHOCARDIOGRAM COMPLETE  Result Date: 05/05/2020    ECHOCARDIOGRAM REPORT   Patient Name:   Paige [name] Date of Exam: 05/05/2020 Medical Rec #:  [id number]        Height:       67.0 in Accession #:    [id number]       Weight:       327.4 lb Date of Birth:  Jan 10, 1946        BSA:          2.493 m Patient Age:    74 years         BP:           132/68 mmHg Patient Gender: F                HR:           91 bpm. Exam Location:  Inpatient Procedure: 2D Echo Indications:    CHF-Acute Diastolic I50.31  History:        Patient has no prior history of Echocardiogram examinations.                 Risk Factors:Hypertension.  Sonographer:    Thurman Coyerasey Kirkpatrick RDCS (AE) Referring Phys: [id number] MOHAMMAD L GARBA IMPRESSIONS  1. Left ventricular ejection fraction, by estimation, is 35 to 40%. The left ventricle has moderately decreased function. Left ventricular endocardial border not optimally defined to evaluate regional wall motion. The left ventricular internal cavity size was  mildly dilated. Left ventricular diastolic parameters are consistent with Grade II diastolic dysfunction (pseudonormalization).  2. Right ventricular systolic function is mildly reduced. The right ventricular size is normal.  3. The mitral valve is grossly normal. Trivial mitral valve regurgitation.  4. The aortic valve is grossly normal. Aortic valve regurgitation is not visualized. FINDINGS  Left Ventricle: Left ventricular ejection fraction, by estimation, is 35 to 40%. The left ventricle has moderately decreased function. Left ventricular endocardial border not optimally defined to evaluate regional wall motion. The left ventricular internal cavity size was mildly dilated. There is no left ventricular hypertrophy. Left ventricular diastolic parameters are consistent with Grade II diastolic dysfunction (pseudonormalization). Right Ventricle: The right ventricular size is normal. Right vetricular wall thickness was not well visualized. Right ventricular systolic function is mildly reduced. Left Atrium: Left atrial size was normal in size. Right Atrium: Right atrial size was normal in size. Pericardium: Trivial pericardial effusion is present. Mitral Valve: The mitral valve is grossly normal. Trivial mitral valve regurgitation. Tricuspid Valve: The tricuspid valve is not well visualized. Tricuspid valve regurgitation is trivial. Aortic Valve: The aortic valve is grossly normal. Aortic valve regurgitation is not visualized. Pulmonic Valve: The pulmonic valve was not well visualized. Pulmonic valve regurgitation is trivial. Aorta: The aortic root and ascending aorta are structurally normal, with no evidence of dilitation. IAS/Shunts: The atrial septum is grossly normal.  LEFT VENTRICLE PLAX 2D LVIDd:         6.10 cm  Diastology LVIDs:         5.00 cm  LV  e' medial:    6.05 cm/s LV PW:         1.00 cm  LV E/e' medial:  15.9 LV IVS:        1.00 cm  LV e' lateral:   6.80 cm/s LVOT diam:     2.30 cm  LV E/e' lateral:  14.1 LV SV:         45 LV SV Index:   18 LVOT Area:     4.15 cm  RIGHT VENTRICLE RV S prime:     10.70 cm/s TAPSE (M-mode): 1.5 cm LEFT ATRIUM             Index       RIGHT ATRIUM           Index LA diam:        4.20 cm 1.68 cm/m  RA Area:     14.90 cm LA Vol (A2C):   86.0 ml 34.49 ml/m RA Volume:   34.40 ml  13.80 ml/m LA Vol (A4C):   60.3 ml 24.18 ml/m LA Biplane Vol: 73.0 ml 29.28 ml/m  AORTIC VALVE LVOT Vmax:   55.50 cm/s LVOT Vmean:  36.700 cm/s LVOT VTI:    0.109 m  AORTA Ao Root diam: 3.60 cm MITRAL VALVE MV Area (PHT): 4.12 cm    SHUNTS MV Decel Time: 184 msec    Systemic VTI:  0.11 m MV E velocity: 96.00 cm/s  Systemic Diam: 2.30 cm MV A velocity: 49.00 cm/s MV E/A ratio:  1.96 Kristeen Miss MD Electronically signed by Kristeen Miss MD Signature Date/Time: 05/05/2020/4:17:02 PM    Final         Scheduled Meds: . buPROPion  150 mg Oral Daily  . carvedilol  3.125 mg Oral BID WC  . enoxaparin (LOVENOX) injection  40 mg Subcutaneous Daily  . furosemide  40 mg Intravenous Daily  . losartan  25 mg Oral Daily  . mouth rinse  15 mL Mouth Rinse BID  . PARoxetine  40 mg Oral BH-q7a  . pramipexole  0.25 mg Oral BID  . sodium chloride flush  3 mL Intravenous Q12H  . traZODone  150 mg Oral QHS  . Vitamin D (Ergocalciferol)  50,000 Units Oral Q Wed   Continuous Infusions: . sodium chloride       LOS: 3 days    Time spent:40 min    Cloteal Isaacson, Roselind Messier, MD [facility]   If 7PM-7AM, please contact night-coverage 05/07/2020, 8:53 AM

## 2020-05-07 NOTE — Care Management Important Message (Signed)
Important Message  Patient Details  Name: TAMU GOLZ MRN: [id number] Date of Birth: 05/19/1945   Medicare Important Message Given:  Yes     Rasheem Figiel Stefan Church 05/07/2020, 2:15 PM

## 2020-05-07 NOTE — Progress Notes (Signed)
Removed 2CC air from tR band, no complications, remaining value is 10cc

## 2020-05-07 NOTE — Consult Note (Signed)
Ref: [name], MD   Subjective:  Awake. Mild respiratory distress. VS stable. Hypokalemia corrected. Patient ready for R + L heart catheterization.  Objective:  Vital Signs in the last 24 hours: Temp:  [98.3 F (36.8 C)-98.5 F (36.9 C)] 98.5 F (36.9 C) (04/14 0800) Pulse Rate:  [70-89] 80 (04/14 1000) Cardiac Rhythm: Normal sinus rhythm (04/14 0700) Resp:  [14-20] 14 (04/14 1000) BP: (92-122)/(54-69) 115/69 (04/14 1000) SpO2:  [92 %-97 %] 97 % (04/14 1000) Weight:  [146.2 kg] 146.2 kg (04/14 0500)  Physical Exam: BP Readings from Last 1 Encounters:  05/07/20 115/69     Wt Readings from Last 1 Encounters:  05/07/20 (!) 146.2 kg    Weight change: 1.094 kg Body mass index is 50.48 kg/m. HEENT: [place]/AT, Eyes-Blue,  Conjunctiva-Pink, Sclera-Non-icteric Neck: No JVD, No bruit, Trachea midline. Lungs:  Wheezing on forced expiration, Bilateral. Cardiac:  Regular rhythm, normal S1 and S2, no S3. III/VI systolic murmur. Abdomen:  Soft, non-tender. BS present. Extremities:  1 + edema present. No cyanosis. No clubbing. CNS: AxOx3, Cranial nerves grossly intact, moves all 4 extremities.  Skin: Warm and dry.   Intake/Output from previous day: 04/13 0701 - 04/14 0700 In: 3 [I.V.:3] Out: -     Lab Results: BMET    Component Value Date/Time   NA 135 05/07/2020 0017   NA 139 05/06/2020 0125   NA 139 05/05/2020 0038   NA 138 02/28/2018 1128   K 4.2 05/07/2020 0017   K 3.3 (L) 05/06/2020 0125   K 3.5 05/05/2020 0038   CL 95 (L) 05/07/2020 0017   CL 97 (L) 05/06/2020 0125   CL 103 05/05/2020 0038   CO2 35 (H) 05/07/2020 0017   CO2 35 (H) 05/06/2020 0125   CO2 32 05/05/2020 0038   GLUCOSE 93 05/07/2020 0017   GLUCOSE 114 (H) 05/06/2020 0125   GLUCOSE 131 (H) 05/05/2020 0038   BUN 25 (H) 05/07/2020 0017   BUN 21 05/06/2020 0125   BUN 16 05/05/2020 0038   BUN 15 02/28/2018 1128   CREATININE 1.03 (H) 05/07/2020 0017   CREATININE 1.03 (H) 05/06/2020 0125    CREATININE 0.96 05/05/2020 0038   CALCIUM 8.4 (L) 05/07/2020 0017   CALCIUM 8.9 05/06/2020 0125   CALCIUM 8.9 05/05/2020 0038   GFRNONAA 57 (L) 05/07/2020 0017   GFRNONAA 57 (L) 05/06/2020 0125   GFRNONAA >60 05/05/2020 0038   GFRAA 61 02/28/2018 1128   GFRAA >60 12/01/2015 0702   GFRAA >60 11/19/2015 0957   CBC    Component Value Date/Time   WBC 8.3 05/05/2020 0038   RBC 4.66 05/05/2020 0038   HGB 14.6 05/05/2020 0038   HGB 15.9 02/28/2018 1128   HCT 44.6 05/05/2020 0038   HCT 47.5 (H) 02/28/2018 1128   PLT 211 05/05/2020 0038   MCV 95.7 05/05/2020 0038   MCV 93 02/28/2018 1128   MCH 31.3 05/05/2020 0038   MCHC 32.7 05/05/2020 0038   RDW 14.3 05/05/2020 0038   RDW 13.0 02/28/2018 1128   LYMPHSABS 1.0 05/05/2020 0038   LYMPHSABS 0.9 02/28/2018 1128   MONOABS 0.6 05/05/2020 0038   EOSABS 0.2 05/05/2020 0038   EOSABS 0.1 02/28/2018 1128   BASOSABS 0.0 05/05/2020 0038   BASOSABS 0.0 02/28/2018 1128   HEPATIC Function Panel Recent Labs    05/07/20 0017  PROT 5.4*   HEMOGLOBIN A1C No components found for: HGA1C,  MPG CARDIAC ENZYMES No results found for: CKTOTAL, CKMB, CKMBINDEX, TROPONINI BNP No results  for input(s): PROBNP in the last 8760 hours. TSH Recent Labs    05/05/20 0038  TSH 2.941   CHOLESTEROL Recent Labs    05/06/20 0125  CHOL 141    Scheduled Meds: . buPROPion  150 mg Oral Daily  . carvedilol  3.125 mg Oral BID WC  . enoxaparin (LOVENOX) injection  40 mg Subcutaneous Daily  . furosemide  40 mg Intravenous Daily  . losartan  25 mg Oral Daily  . mouth rinse  15 mL Mouth Rinse BID  . PARoxetine  40 mg Oral BH-q7a  . pramipexole  0.25 mg Oral BID  . sodium chloride flush  3 mL Intravenous Q12H  . traZODone  150 mg Oral QHS  . Vitamin D (Ergocalciferol)  50,000 Units Oral Q Wed   Continuous Infusions: . sodium chloride     PRN Meds:.sodium chloride, acetaminophen, hydrALAZINE, ondansetron (ZOFRAN) IV, sodium chloride  flush  Assessment/Plan: Acute systolic left heart failure, HFrEF R/O CAD HTN HLD Morbid obesity Hypokalemia, corrected  R + L heart cath today.   LOS: 3 days   Time spent including chart review, lab review, examination, discussion with patient/Nurse : 30 min   Orpah Cobb  MD  05/07/2020, 10:30 AM

## 2020-05-08 ENCOUNTER — Encounter (HOSPITAL_COMMUNITY): Payer: Self-pay | Admitting: Cardiovascular Disease

## 2020-05-08 ENCOUNTER — Other Ambulatory Visit (HOSPITAL_COMMUNITY): Payer: Self-pay

## 2020-05-08 DIAGNOSIS — E785 Hyperlipidemia, unspecified: Secondary | ICD-10-CM

## 2020-05-08 DIAGNOSIS — R778 Other specified abnormalities of plasma proteins: Secondary | ICD-10-CM

## 2020-05-08 DIAGNOSIS — N1831 Chronic kidney disease, stage 3a: Secondary | ICD-10-CM | POA: Diagnosis not present

## 2020-05-08 DIAGNOSIS — J9601 Acute respiratory failure with hypoxia: Secondary | ICD-10-CM

## 2020-05-08 DIAGNOSIS — I5041 Acute combined systolic (congestive) and diastolic (congestive) heart failure: Secondary | ICD-10-CM

## 2020-05-08 DIAGNOSIS — G2581 Restless legs syndrome: Secondary | ICD-10-CM

## 2020-05-08 DIAGNOSIS — I10 Essential (primary) hypertension: Secondary | ICD-10-CM

## 2020-05-08 DIAGNOSIS — F419 Anxiety disorder, unspecified: Secondary | ICD-10-CM | POA: Diagnosis not present

## 2020-05-08 DIAGNOSIS — Z6841 Body Mass Index (BMI) 40.0 and over, adult: Secondary | ICD-10-CM

## 2020-05-08 DIAGNOSIS — R7989 Other specified abnormal findings of blood chemistry: Secondary | ICD-10-CM | POA: Diagnosis present

## 2020-05-08 DIAGNOSIS — I509 Heart failure, unspecified: Secondary | ICD-10-CM | POA: Diagnosis not present

## 2020-05-08 HISTORY — DX: Other specified abnormalities of plasma proteins: R77.8

## 2020-05-08 HISTORY — DX: Acute combined systolic (congestive) and diastolic (congestive) heart failure: I50.41

## 2020-05-08 HISTORY — DX: Morbid (severe) obesity due to excess calories: E66.01

## 2020-05-08 HISTORY — DX: Restless legs syndrome: G25.81

## 2020-05-08 HISTORY — DX: Essential (primary) hypertension: I10

## 2020-05-08 HISTORY — DX: Other specified abnormal findings of blood chemistry: R79.89

## 2020-05-08 HISTORY — DX: Hyperlipidemia, unspecified: E78.5

## 2020-05-08 HISTORY — DX: Acute respiratory failure with hypoxia: J96.01

## 2020-05-08 LAB — COMPREHENSIVE METABOLIC PANEL
ALT: 25 U/L (ref 0–44)
AST: 23 U/L (ref 15–41)
Albumin: 3.3 g/dL — ABNORMAL LOW (ref 3.5–5.0)
Alkaline Phosphatase: 98 U/L (ref 38–126)
Anion gap: 3 — ABNORMAL LOW (ref 5–15)
BUN: 19 mg/dL (ref 8–23)
CO2: 32 mmol/L (ref 22–32)
Calcium: 8.6 mg/dL — ABNORMAL LOW (ref 8.9–10.3)
Chloride: 100 mmol/L (ref 98–111)
Creatinine, Ser: 0.82 mg/dL (ref 0.44–1.00)
GFR, Estimated: >60 mL/min (ref >60)
Glucose, Bld: 106 mg/dL — ABNORMAL HIGH (ref 70–99)
Potassium: 3.8 mmol/L (ref 3.5–5.1)
Sodium: 135 mmol/L (ref 135–145)
Total Bilirubin: 0.9 mg/dL (ref 0.3–1.2)
Total Protein: 5.6 g/dL — ABNORMAL LOW (ref 6.5–8.1)

## 2020-05-08 LAB — CBC WITH DIFFERENTIAL/PLATELET
Abs Immature Granulocytes: 0.03 10*3/uL (ref 0.00–0.07)
Basophils Absolute: 0 10*3/uL (ref 0.0–0.1)
Basophils Relative: 1 %
Eosinophils Absolute: 0.2 10*3/uL (ref 0.0–0.5)
Eosinophils Relative: 4 %
HCT: 43.6 % (ref 36.0–46.0)
Hemoglobin: 14.1 g/dL (ref 12.0–15.0)
Immature Granulocytes: 1 %
Lymphocytes Relative: 15 %
Lymphs Abs: 0.8 10*3/uL (ref 0.7–4.0)
MCH: 31.7 pg (ref 26.0–34.0)
MCHC: 32.3 g/dL (ref 30.0–36.0)
MCV: 98 fL (ref 80.0–100.0)
Monocytes Absolute: 0.5 10*3/uL (ref 0.1–1.0)
Monocytes Relative: 10 %
Neutro Abs: 3.9 10*3/uL (ref 1.7–7.7)
Neutrophils Relative %: 69 %
Platelets: 189 10*3/uL (ref 150–400)
RBC: 4.45 MIL/uL (ref 3.87–5.11)
RDW: 14.1 % (ref 11.5–15.5)
WBC: 5.6 10*3/uL (ref 4.0–10.5)
nRBC: 0 % (ref 0.0–0.2)

## 2020-05-08 LAB — LIPID PANEL
Cholesterol: 137 mg/dL (ref 0–200)
HDL: 40 mg/dL — ABNORMAL LOW (ref >40)
LDL Cholesterol: 80 mg/dL (ref 0–99)
Total CHOL/HDL Ratio: 3.4 RATIO
Triglycerides: 83 mg/dL (ref <150)
VLDL: 17 mg/dL (ref 0–40)

## 2020-05-08 LAB — MAGNESIUM: Magnesium: 2.2 mg/dL (ref 1.7–2.4)

## 2020-05-08 LAB — PHOSPHORUS: Phosphorus: 3.7 mg/dL (ref 2.5–4.6)

## 2020-05-08 MED ORDER — FUROSEMIDE 40 MG PO TABS
40.0000 mg | ORAL_TABLET | Freq: Every day | ORAL | 0 refills | Status: DC
  Filled 2020-05-08: qty 30, 30d supply, fill #0

## 2020-05-08 MED ORDER — FUROSEMIDE 40 MG PO TABS: 40.0000 mg | ORAL_TABLET | Freq: Every day | ORAL | Status: DC

## 2020-05-08 MED ORDER — ATORVASTATIN CALCIUM 40 MG PO TABS: 40.0000 mg | ORAL_TABLET | Freq: Every day | ORAL | Status: DC

## 2020-05-08 MED ORDER — ATORVASTATIN CALCIUM 40 MG PO TABS
40.0000 mg | ORAL_TABLET | Freq: Every day | ORAL | 0 refills | Status: AC
  Filled 2020-05-08: qty 30, 30d supply, fill #0

## 2020-05-08 MED ORDER — POTASSIUM CHLORIDE CRYS ER 10 MEQ PO TBCR
10.0000 meq | EXTENDED_RELEASE_TABLET | Freq: Every day | ORAL | 0 refills | Status: DC
  Filled 2020-05-08: qty 30, 30d supply, fill #0

## 2020-05-08 MED ORDER — LOSARTAN POTASSIUM 25 MG PO TABS
25.0000 mg | ORAL_TABLET | Freq: Every day | ORAL | 0 refills | Status: DC
  Filled 2020-05-08: qty 30, 30d supply, fill #0

## 2020-05-08 MED ORDER — POTASSIUM CHLORIDE ER 10 MEQ PO TBCR
10.0000 meq | EXTENDED_RELEASE_TABLET | Freq: Every day | ORAL | Status: DC
  Administered 2020-05-08: 10 meq via ORAL
  Filled 2020-05-08: qty 1

## 2020-05-08 MED ORDER — TRAZODONE HCL 150 MG PO TABS
150.0000 mg | ORAL_TABLET | Freq: Every day | ORAL | 0 refills | Status: DC
  Filled 2020-05-08: qty 30, 30d supply, fill #0

## 2020-05-08 MED ORDER — VITAMIN D (ERGOCALCIFEROL) 1.25 MG (50000 UNIT) PO CAPS
50000.0000 [IU] | ORAL_CAPSULE | ORAL | 0 refills | Status: DC
  Filled 2020-05-08: qty 5, 35d supply, fill #0

## 2020-05-08 MED ORDER — CARVEDILOL 3.125 MG PO TABS
3.1250 mg | ORAL_TABLET | Freq: Two times a day (BID) | ORAL | 0 refills | Status: DC
  Filled 2020-05-08: qty 60, 30d supply, fill #0

## 2020-05-08 MED ORDER — PAROXETINE HCL 40 MG PO TABS
40.0000 mg | ORAL_TABLET | ORAL | 0 refills | Status: DC
  Filled 2020-05-08: qty 30, 30d supply, fill #0

## 2020-05-08 NOTE — TOC Transition Note (Signed)
Transition of Care [facility]) - CM/SW Discharge Note   Patient Details  Name: Paige [name] MRN: [id number] Date of Birth: 07/08/1945  [facility] CM/SW Contact:  Leone Haven, RN Phone Number: 05/08/2020, 6:33 PM   Clinical Narrative:    Patient for dc home today, will need home oxygen, NCM spoke with patient and she states she is ok with [employer] supplying her home oxygen.  NCM made referral to [facility] with [employer], the oxygen will be delivered to her room prior to dc and the concentrator will be set up at her home. NCM informed Staff RN that oxygen was being ordered.   Final next level of care: Home/Self Care Barriers to Discharge: No Barriers Identified   Patient Goals and CMS Choice     Choice offered to / list presented to : NA  Discharge Placement                       Discharge Plan and Services                DME Arranged: Oxygen DME Agency: [employer]       HH Arranged: NA          Social Determinants of Health (SDOH) Interventions     Readmission Risk Interventions No flowsheet data found.

## 2020-05-08 NOTE — Progress Notes (Signed)
SATURATION QUALIFICATIONS: (This note is used to comply with regulatory documentation for home oxygen)  Patient Saturations on Room Air at Rest = 93%  Patient Saturations on Room Air while Ambulating = 82%  Patient Saturations on 2 Liters of oxygen while Ambulating = 90%  Please briefly explain why patient needs home oxygen: When pt ambulating, requires O2.

## 2020-05-08 NOTE — Plan of Care (Signed)

## 2020-05-08 NOTE — Consult Note (Signed)
Ref: [name], MD   Subjective:  Awake. VS stable. O2 saturation is 97 % on 2 L oxygen by nasal cannula. Cardiac cath showed normal coronaries.  BP is soft hence will not add spironolactone and Entresto for now. .  Objective:  Vital Signs in the last 24 hours: Temp:  [97.5 F (36.4 C)-98.2 F (36.8 C)] 98.2 F (36.8 C) (04/15 0818) Pulse Rate:  [75-90] 83 (04/15 0818) Cardiac Rhythm: Heart block (04/15 0700) Resp:  [8-47] 17 (04/15 0818) BP: (104-167)/(54-117) 112/58 (04/15 0818) SpO2:  [97 %-100 %] 97 % (04/15 0818) Weight:  [145.9 kg-146.5 kg] 146.5 kg (04/15 0500)  Physical Exam: BP Readings from Last 1 Encounters:  05/08/20 (!) 112/58     Wt Readings from Last 1 Encounters:  05/08/20 (!) 146.5 kg    Weight change: -0.3 kg Body mass index is 50.58 kg/m. HEENT: [place]/AT, Eyes-Blue, Conjunctiva-Pink, Sclera-Non-icteric Neck: No JVD, No bruit, Trachea midline. Lungs:  Clear, Bilateral. Cardiac:  Regular rhythm, normal S1 and S2, no S3. II/VI systolic murmur. Abdomen:  Soft, non-tender. BS present. Extremities:  1 + edema present. No cyanosis. No clubbing. Right radial cath site is stable. CNS: AxOx3, Cranial nerves grossly intact, moves all 4 extremities.  Skin: Warm and dry.   Intake/Output from previous day: 04/14 0701 - 04/15 0700 In: 549.5 [P.O.:490; I.V.:59.5] Out: 1000 [Urine:1000]    Lab Results: BMET    Component Value Date/Time   NA 135 05/08/2020 0133   NA 139 05/07/2020 1746   NA 139 05/07/2020 1745   NA 138 02/28/2018 1128   K 3.8 05/08/2020 0133   K 3.9 05/07/2020 1746   K 4.0 05/07/2020 1745   CL 100 05/08/2020 0133   CL 95 (L) 05/07/2020 0017   CL 97 (L) 05/06/2020 0125   CO2 32 05/08/2020 0133   CO2 35 (H) 05/07/2020 0017   CO2 35 (H) 05/06/2020 0125   GLUCOSE 106 (H) 05/08/2020 0133   GLUCOSE 93 05/07/2020 0017   GLUCOSE 114 (H) 05/06/2020 0125   BUN 19 05/08/2020 0133   BUN 25 (H) 05/07/2020 0017   BUN 21 05/06/2020 0125    BUN 15 02/28/2018 1128   CREATININE 0.82 05/08/2020 0133   CREATININE 1.03 (H) 05/07/2020 0017   CREATININE 1.03 (H) 05/06/2020 0125   CALCIUM 8.6 (L) 05/08/2020 0133   CALCIUM 8.4 (L) 05/07/2020 0017   CALCIUM 8.9 05/06/2020 0125   GFRNONAA >60 05/08/2020 0133   GFRNONAA 57 (L) 05/07/2020 0017   GFRNONAA 57 (L) 05/06/2020 0125   GFRAA 61 02/28/2018 1128   GFRAA >60 12/01/2015 0702   GFRAA >60 11/19/2015 0957   CBC    Component Value Date/Time   WBC 5.6 05/08/2020 0133   RBC 4.45 05/08/2020 0133   HGB 14.1 05/08/2020 0133   HGB 15.9 02/28/2018 1128   HCT 43.6 05/08/2020 0133   HCT 47.5 (H) 02/28/2018 1128   PLT 189 05/08/2020 0133   MCV 98.0 05/08/2020 0133   MCV 93 02/28/2018 1128   MCH 31.7 05/08/2020 0133   MCHC 32.3 05/08/2020 0133   RDW 14.1 05/08/2020 0133   RDW 13.0 02/28/2018 1128   LYMPHSABS 0.8 05/08/2020 0133   LYMPHSABS 0.9 02/28/2018 1128   MONOABS 0.5 05/08/2020 0133   EOSABS 0.2 05/08/2020 0133   EOSABS 0.1 02/28/2018 1128   BASOSABS 0.0 05/08/2020 0133   BASOSABS 0.0 02/28/2018 1128   HEPATIC Function Panel Recent Labs    05/07/20 0017 05/08/20 0133  PROT 5.4*  5.6*   HEMOGLOBIN A1C No components found for: HGA1C,  MPG CARDIAC ENZYMES No results found for: CKTOTAL, CKMB, CKMBINDEX, TROPONINI BNP No results for input(s): PROBNP in the last 8760 hours. TSH Recent Labs    05/05/20 0038  TSH 2.941   CHOLESTEROL Recent Labs    05/06/20 0125 05/08/20 0133  CHOL 141 137    Scheduled Meds: . buPROPion  150 mg Oral Daily  . carvedilol  3.125 mg Oral BID WC  . enoxaparin (LOVENOX) injection  40 mg Subcutaneous Daily  . furosemide  40 mg Intravenous Daily  . losartan  25 mg Oral Daily  . mouth rinse  15 mL Mouth Rinse BID  . PARoxetine  40 mg Oral [id number]  . pramipexole  0.25 mg Oral BID  . sodium chloride flush  3 mL Intravenous Q12H  . sodium chloride flush  3 mL Intravenous Q12H  . traZODone  150 mg Oral QHS  . Vitamin D  (Ergocalciferol)  50,000 Units Oral Q Wed   Continuous Infusions: . sodium chloride    . sodium chloride     PRN Meds:.sodium chloride, sodium chloride, acetaminophen, hydrALAZINE, ondansetron (ZOFRAN) IV, sodium chloride flush, sodium chloride flush  Assessment/Plan: Acute systolic left heart failure, HFrEF Non-ischemic dilated cardiomyopathy HTN HLD Morbid obesity  Add Entresto and spironolactone on follow up if BP remains stable. May need home oxygen if O2 saturation drops significantly with activity. F/U in 1 week.    LOS: 4 days   Time spent including chart review, lab review, examination, discussion with patient : 30 min   Orpah Cobb  MD  05/08/2020, 11:34 AM

## 2020-05-08 NOTE — Progress Notes (Signed)
RN went over discharge summary with pt. O2 condenser and tank delivered to bedside, as well as meds from pharmacy. Pt belongings with pt. NT removed IV. RN and NT transported pt and belongings to private vehicle. Pt's friend transporting pt home.

## 2020-05-08 NOTE — Discharge Summary (Addendum)
Physician Discharge Summary  Paige [name] EKC:[id number] DOB: 1945-08-26 DOA: 05/04/2020  PCP: Paige Floro, MD  Admit date: 05/04/2020 Discharge date: 05/08/2020  Time spent:35 minutes  Recommendations for Outpatient Follow-up:   Covid vaccination; vaccinated 3/3  Acute respiratory failure with hypoxia -Most likely secondary to acute systolic CHF -Titrate O2 to maintain SPO2> 92% -Patient meets criteria for home O2 SATURATION QUALIFICATIONS: (Thisnote is usedto comply with regulatory documentation for home oxygen) Patient Saturations on Room Air at Rest =93% Patient Saturations on [employer] while Ambulating =82% Patient Saturations on2Liters of oxygen while Ambulating = 90% Please briefly explain why patient needs home oxygen:When pt ambulating, requires O2 -2 L O2 via [place] titrate to maintain SPO2> 92% - Provide Inogen portable home O2 concentrator  Acute systolic and diastolic CHF -EF 30 to 40%.  See results below -Strict in and out -4.8 L -Daily weight Filed Weights   05/07/20 0500 05/07/20 1258 05/08/20 0500  Weight: (!) 146.2 kg (!) 145.9 kg (!) 146.5 kg  - Coreg 3.125 mg BID - Lasix 40 mg daily - Losartan 25 mg daily -Add Entresto and spironolactone on follow up if BP remains stable.  Will be managed by Dr. Algie [name] cardiologist - Schedule follow-up appointment with Dr. Orpah [name] cardiology in 1 week acute systolic and diastolic CHF  Elevated troponin -Patient with new onset systolic and diastolic CHF most likely secondary to MI. Results for Paige [name] (MRN [id number]) as of 05/06/2020 10:16  Ref. Range 05/05/2020 08:57 05/05/2020 13:59 05/05/2020 15:12 05/05/2020 18:00  Troponin I (High Sensitivity) Latest Ref Range: <18 ng/L 34 (H)  30 (H) 29 (H)  -Trending down   Essential HTN -See CHF  Hyperlipidemia: -4/15 LDL = 80: Goal LDL <70 mg/dL  -  Lipitor 40 mg daily  Anxiety disorder: -Wellbutrin 150 mg daily -Paxil 40 mg  daily -Trazodone 150 mg daily  Restless leg syndrome: -Pramipexole 0.25 mg BID.   Morbid obesity (Body mass index is 50.1 kg/m). -Counseledin detail about diet and exercise. -Outpatient PCP follow-up  Hypokalemia -Potassium goal> 4    Discharge Diagnoses:  Principal Problem:   Acute exacerbation of CHF (congestive heart failure) (HCC) Active Problems:   Arthritis of knee   Class 3 severe obesity with serious comorbidity and body mass index (BMI) of 45.0 to 49.9 in adult [facility])   Anxiety disorder   Chronic kidney disease, stage 3a (HCC)   Esophageal reflux   High cholesterol   Acute respiratory failure with hypoxia (HCC)   Systolic and diastolic CHF, acute (HCC)   Elevated troponin   Essential hypertension   Hyperlipidemia   Restless leg syndrome   Morbid obesity with BMI of 50.0-59.9, adult [facility])   Discharge Condition: Stable  Diet recommendation: Heart healthy  Filed Weights   05/07/20 0500 05/07/20 1258 05/08/20 0500  Weight: (!) 146.2 kg (!) 145.9 kg (!) 146.5 kg    History of present illness:  This 75 years old WF PMHx osteoarthritis, hyperlipidemia, GERD, depression, chronic lymphedema of both lower extremities, restless leg syndrome and vitamin B12 deficiency   Presents in the ER with significant shortness of breath and hypoxia. Patient reports progressive worsening of shortness of breath for last 2 weeks. She report having shortness of breath on exertion but now happeningeven at rest. Patient was severely hypoxic on arrival O2 saturation was 80% on RArequiring nonrebreather at 15 L. She was significantly wheezing and was given Lasix and breathing treatment. Chest x-ray shows findings consistent with pulmonary edema. BNP  256,Covid and influenza negative. Patient is admitted for new onset CHF exacerbation.   Hospital Course:  See above  Procedures: 4/12 Echocardiogram;Left Ventricle: LVEF=35 to 40%. Moderately decreased function. -Left  ventricular diastolic parameters are consistent with Grade II diastolic dysfunction (pseudonormalization).  4/14 RIGHT/LEFT heart cath:-LV end diastolic pressure is normal. -Preserved cardiac output and normal LVEDP and wedge and PA pressures. Life style modifications with diet, medications and exercise.   Consultations: Dr. Algie CofferKadakia cardiology  Ventilator settings Nasal cannula 4/15 Flow 1 L/min SPO2 96%    Discharge Exam: Vitals:   05/08/20 0355 05/08/20 0500 05/08/20 0818 05/08/20 1222  BP: (!) 111/54  (!) 112/58 (!) 116/56  Pulse:   83 85  Resp:   17 15  Temp: (!) 97.5 F (36.4 C)  98.2 F (36.8 C) 98.3 F (36.8 C)  TempSrc: Oral  Oral Oral  SpO2:   97% 96%  Weight:  (!) 146.5 kg    Height:        General: A/O x4, positive acute respiratory distress Eyes: negative scleral hemorrhage, negative anisocoria, negative icterus ENT: Negative Runny nose, negative gingival bleeding, Neck:  Negative scars, masses, torticollis, lymphadenopathy, JVD Lungs: Clear to auscultation bilaterally without wheezes or crackles Cardiovascular: Regular rate and rhythm without murmur gallop or rub normal S1 and S2   Discharge Instructions   Allergies as of 05/08/2020      Reactions   No Known Allergies       Medication List    STOP taking these medications   ALPRAZolam 0.5 MG tablet Commonly known as: XANAX     TAKE these medications   atorvastatin 40 MG tablet Commonly known as: LIPITOR Take 1 tablet (40 mg total) by mouth daily.   buPROPion 150 MG 24 hr tablet Commonly known as: WELLBUTRIN XL Take 150 mg by mouth daily.   Calcium-Vitamin D 500-125 MG-UNIT Tabs Take 1 tablet by mouth daily at 6 (six) AM.   carvedilol 3.125 MG tablet Commonly known as: COREG Take 1 tablet (3.125 mg total) by mouth 2 (two) times daily with a meal.   cholecalciferol 25 MCG (1000 UNIT) tablet Commonly known as: VITAMIN D3 Take 1,000 Units by mouth daily.   furosemide 40 MG  tablet Commonly known as: LASIX Take 1 tablet (40 mg total) by mouth daily. Start taking on: May 09, 2020   losartan 25 MG tablet Commonly known as: COZAAR Take 1 tablet (25 mg total) by mouth daily. Start taking on: May 09, 2020   naproxen sodium 220 MG tablet Commonly known as: ALEVE Take 220 mg by mouth daily as needed (pain).   PARoxetine 40 MG tablet Commonly known as: PAXIL Take 1 tablet (40 mg total) by mouth every morning. Start taking on: May 09, 2020 What changed: Another medication with the same name was removed. Continue taking this medication, and follow the directions you see here.   potassium chloride 10 MEQ tablet Commonly known as: KLOR-CON Take 1 tablet (10 mEq total) by mouth daily. Start taking on: May 09, 2020   pramipexole 0.25 MG tablet Commonly known as: MIRAPEX Take 0.25 mg by mouth 2 (two) times daily.   SINEX REGULAR NA Place 1 spray into both nostrils daily as needed (congestion).   traZODone 150 MG tablet Commonly known as: DESYREL Take 1 tablet (150 mg total) by mouth at bedtime.   VITAMIN B 12 PO Take 1 tablet by mouth daily.   Vitamin D (Ergocalciferol) 1.25 MG (50000 UNIT) Caps capsule Commonly known as:  DRISDOL Take 1 capsule (50,000 Units total) by mouth every Wednesday. Start taking on: May 13, 2020 What changed: when to take this            Durable Medical Equipment  (From admission, onward)         Start     Ordered   05/08/20 1317  For home use only DME oxygen  Once       Comments: -Patient meets criteria for home O2 SATURATION QUALIFICATIONS: (This note is used to comply with regulatory documentation for home oxygen) Patient Saturations on Room Air at Rest = 93% Patient Saturations on Room Air while Ambulating = 82% Patient Saturations on 2 Liters of oxygen while Ambulating = 90% Please briefly explain why patient needs home oxygen: When pt ambulating, requires O2 -2 L O2 via [place] titrate to maintain SPO2>  92% - Provide Inogen portable home O2 concentrator  Question Answer Comment  Length of Need Lifetime   Mode or (Route) Nasal cannula   Liters per Minute 2   Frequency Continuous (stationary and portable oxygen unit needed)   Oxygen conserving device Yes   Oxygen delivery system Gas      05/08/20 1317         Allergies  Allergen Reactions  . No Known Allergies     Follow-up Information    Paige Cobb, MD. Schedule an appointment as soon as possible for a visit in 1 week(s).   Specialty: Cardiology Contact information: [address] [place] [address] [phone]                The results of significant diagnostics from this hospitalization (including imaging, microbiology, ancillary and laboratory) are listed below for reference.    Significant Diagnostic Studies: CARDIAC CATHETERIZATION  Result Date: 05/07/2020  LV end diastolic pressure is normal.  Preserved cardiac output and normal LVEDP and wedge and PA pressures. Life style modifications with diet, medications and exercise.   DG Chest Port 1 View  Result Date: 05/04/2020 CLINICAL DATA:  Dyspnea, shortness of breath EXAM: PORTABLE CHEST 1 VIEW COMPARISON:  11/19/2015 FINDINGS: Cardiomegaly. Mild, diffuse bilateral interstitial pulmonary opacity. Possible layering small pleural effusions. The visualized skeletal structures are unremarkable. IMPRESSION: Cardiomegaly with mild, diffuse bilateral interstitial pulmonary opacity and possible layering small pleural effusions. Findings are most consistent with edema. Electronically Signed   By: Lauralyn Primes M.D.   On: 05/04/2020 17:59   ECHOCARDIOGRAM COMPLETE  Result Date: 05/05/2020    ECHOCARDIOGRAM REPORT   Patient Name:   MIKENZIE MCCANNON Date of Exam: 05/05/2020 Medical Rec #:  [id number]        Height:       67.0 in Accession #:    [id number]       Weight:       327.4 lb Date of Birth:  15-Jun-1945        BSA:          2.493 m Patient Age:    74 years          BP:           132/68 mmHg Patient Gender: F                HR:           91 bpm. Exam Location:  Inpatient Procedure: 2D Echo Indications:    CHF-Acute Diastolic I50.31  History:        Patient has no prior history of Echocardiogram examinations.  Risk Factors:Hypertension.  Sonographer:    Thurman Coyer RDCS (AE) Referring Phys: [id number] MOHAMMAD L GARBA IMPRESSIONS  1. Left ventricular ejection fraction, by estimation, is 35 to 40%. The left ventricle has moderately decreased function. Left ventricular endocardial border not optimally defined to evaluate regional wall motion. The left ventricular internal cavity size was mildly dilated. Left ventricular diastolic parameters are consistent with Grade II diastolic dysfunction (pseudonormalization).  2. Right ventricular systolic function is mildly reduced. The right ventricular size is normal.  3. The mitral valve is grossly normal. Trivial mitral valve regurgitation.  4. The aortic valve is grossly normal. Aortic valve regurgitation is not visualized. FINDINGS  Left Ventricle: Left ventricular ejection fraction, by estimation, is 35 to 40%. The left ventricle has moderately decreased function. Left ventricular endocardial border not optimally defined to evaluate regional wall motion. The left ventricular internal cavity size was mildly dilated. There is no left ventricular hypertrophy. Left ventricular diastolic parameters are consistent with Grade II diastolic dysfunction (pseudonormalization). Right Ventricle: The right ventricular size is normal. Right vetricular wall thickness was not well visualized. Right ventricular systolic function is mildly reduced. Left Atrium: Left atrial size was normal in size. Right Atrium: Right atrial size was normal in size. Pericardium: Trivial pericardial effusion is present. Mitral Valve: The mitral valve is grossly normal. Trivial mitral valve regurgitation. Tricuspid Valve: The tricuspid valve is not well  visualized. Tricuspid valve regurgitation is trivial. Aortic Valve: The aortic valve is grossly normal. Aortic valve regurgitation is not visualized. Pulmonic Valve: The pulmonic valve was not well visualized. Pulmonic valve regurgitation is trivial. Aorta: The aortic root and ascending aorta are structurally normal, with no evidence of dilitation. IAS/Shunts: The atrial septum is grossly normal.  LEFT VENTRICLE PLAX 2D LVIDd:         6.10 cm  Diastology LVIDs:         5.00 cm  LV e' medial:    6.05 cm/s LV PW:         1.00 cm  LV E/e' medial:  15.9 LV IVS:        1.00 cm  LV e' lateral:   6.80 cm/s LVOT diam:     2.30 cm  LV E/e' lateral: 14.1 LV SV:         45 LV SV Index:   18 LVOT Area:     4.15 cm  RIGHT VENTRICLE RV S prime:     10.70 cm/s TAPSE (M-mode): 1.5 cm LEFT ATRIUM             Index       RIGHT ATRIUM           Index LA diam:        4.20 cm 1.68 cm/m  RA Area:     14.90 cm LA Vol (A2C):   86.0 ml 34.49 ml/m RA Volume:   34.40 ml  13.80 ml/m LA Vol (A4C):   60.3 ml 24.18 ml/m LA Biplane Vol: 73.0 ml 29.28 ml/m  AORTIC VALVE LVOT Vmax:   55.50 cm/s LVOT Vmean:  36.700 cm/s LVOT VTI:    0.109 m  AORTA Ao Root diam: 3.60 cm MITRAL VALVE MV Area (PHT): 4.12 cm    SHUNTS MV Decel Time: 184 msec    Systemic VTI:  0.11 m MV E velocity: 96.00 cm/s  Systemic Diam: 2.30 cm MV A velocity: 49.00 cm/s MV E/A ratio:  1.96 Kristeen Miss MD Electronically signed by Kristeen Miss MD Signature Date/Time: 05/05/2020/4:17:02 PM  Final     Microbiology: Recent Results (from the past 240 hour(s))  Resp Panel by RT-PCR (Flu A&B, Covid) Nasopharyngeal Swab     Status: None   Collection Time: 05/04/20  5:14 PM   Specimen: Nasopharyngeal Swab; Nasopharyngeal(NP) swabs in vial transport medium  Result Value Ref Range Status   SARS Coronavirus 2 by RT PCR NEGATIVE NEGATIVE Final    Comment: (NOTE) SARS-CoV-2 target nucleic acids are NOT DETECTED.  The SARS-CoV-2 RNA is generally detectable in upper  respiratory specimens during the acute phase of infection. The lowest concentration of SARS-CoV-2 viral copies this assay can detect is 138 copies/mL. A negative result does not preclude SARS-Cov-2 infection and should not be used as the sole basis for treatment or other patient management decisions. A negative result may occur with  improper specimen collection/handling, submission of specimen other than nasopharyngeal swab, presence of viral mutation(s) within the areas targeted by this assay, and inadequate number of viral copies(<138 copies/mL). A negative result must be combined with clinical observations, patient history, and epidemiological information. The expected result is Negative.  Fact Sheet for Patients:  BloggerCourse.com  Fact Sheet for Healthcare Providers:  SeriousBroker.it  This test is no t yet approved or cleared by the [place] FDA and  has been authorized for detection and/or diagnosis of SARS-CoV-2 by FDA under an Emergency Use Authorization (EUA). This EUA will remain  in effect (meaning this test can be used) for the duration of the COVID-19 declaration under Section 564(b)(1) of the Act, 21 U.S.C.section 360bbb-3(b)(1), unless the authorization is terminated  or revoked sooner.       Influenza A by PCR NEGATIVE NEGATIVE Final   Influenza B by PCR NEGATIVE NEGATIVE Final    Comment: (NOTE) The Xpert Xpress SARS-CoV-2/FLU/RSV plus assay is intended as an aid in the diagnosis of influenza from Nasopharyngeal swab specimens and should not be used as a sole basis for treatment. Nasal washings and aspirates are unacceptable for Xpert Xpress SARS-CoV-2/FLU/RSV testing.  Fact Sheet for Patients: BloggerCourse.com  Fact Sheet for Healthcare Providers: SeriousBroker.it  This test is not yet approved or cleared by the [place] FDA and has been  authorized for detection and/or diagnosis of SARS-CoV-2 by FDA under an Emergency Use Authorization (EUA). This EUA will remain in effect (meaning this test can be used) for the duration of the COVID-19 declaration under Section 564(b)(1) of the Act, 21 U.S.C. section 360bbb-3(b)(1), unless the authorization is terminated or revoked.  Performed at [facility], [address], [place], [place] [address]   MRSA PCR Screening     Status: None   Collection Time: 05/05/20 12:45 AM   Specimen: Nasal Mucosa; Nasopharyngeal  Result Value Ref Range Status   MRSA by PCR NEGATIVE NEGATIVE Final    Comment:        The GeneXpert MRSA Assay (FDA approved for NASAL specimens only), is one component of a comprehensive MRSA colonization surveillance program. It is not intended to diagnose MRSA infection nor to guide or monitor treatment for MRSA infections. Performed at [facility], [address], [place], [place] [address]      Labs: Basic Metabolic Panel: Recent Labs  Lab 05/04/20 1714 05/04/20 1723 05/05/20 0038 05/06/20 0125 05/07/20 0017 05/07/20 1745 05/07/20 1746 05/08/20 0133  NA 139   < > 139 139 135 139 139 135  K 4.3   < > 3.5 3.3* 4.2 4.0 3.9 3.8  CL 103  --  103  97* 95*  --   --  100  CO2 29  --  32 35* 35*  --   --  32  GLUCOSE 99  --  131* 114* 93  --   --  106*  BUN 19  --  16 21 25*  --   --  19  CREATININE 0.93  --  0.96 1.03* 1.03*  --   --  0.82  CALCIUM 9.1  --  8.9 8.9 8.4*  --   --  8.6*  MG  --   --   --   --  2.2  --   --  2.2  PHOS  --   --   --   --  3.9  --   --  3.7   < > = values in this interval not displayed.   Liver Function Tests: Recent Labs  Lab 05/07/20 0017 05/08/20 0133  AST 26 23  ALT 20 25  ALKPHOS 102 98  BILITOT 1.4* 0.9  PROT 5.4* 5.6*  ALBUMIN 3.2* 3.3*   No results for input(s): LIPASE, AMYLASE in the last 168 hours. No results for input(s): AMMONIA in the last 168 hours. CBC: Recent Labs  Lab  05/04/20 1714 05/04/20 1723 05/05/20 0038 05/07/20 1745 05/07/20 1746 05/08/20 0133  WBC 8.0  --  8.3  --   --  5.6  NEUTROABS 6.2  --  6.4  --   --  3.9  HGB 15.2* 15.6* 14.6 13.9 13.9 14.1  HCT 47.2* 46.0 44.6 41.0 41.0 43.6  MCV 95.7  --  95.7  --   --  98.0  PLT 225  --  211  --   --  189   Cardiac Enzymes: No results for input(s): CKTOTAL, CKMB, CKMBINDEX, TROPONINI in the last 168 hours. BNP: BNP (last 3 results) Recent Labs    05/04/20 2149  BNP 256.0*    ProBNP (last 3 results) No results for input(s): PROBNP in the last 8760 hours.  CBG: No results for input(s): GLUCAP in the last 168 hours.     Signed:  Carolyne Littles, MD [facility]

## 2020-05-15 DIAGNOSIS — E7849 Other hyperlipidemia: Secondary | ICD-10-CM | POA: Diagnosis not present

## 2020-05-15 DIAGNOSIS — I10 Essential (primary) hypertension: Secondary | ICD-10-CM | POA: Diagnosis not present

## 2020-05-15 DIAGNOSIS — J9601 Acute respiratory failure with hypoxia: Secondary | ICD-10-CM | POA: Diagnosis not present

## 2020-05-15 DIAGNOSIS — I5022 Chronic systolic (congestive) heart failure: Secondary | ICD-10-CM | POA: Diagnosis not present

## 2020-05-20 ENCOUNTER — Ambulatory Visit: Payer: Medicare Other

## 2020-05-25 DIAGNOSIS — G479 Sleep disorder, unspecified: Secondary | ICD-10-CM | POA: Diagnosis not present

## 2020-05-25 DIAGNOSIS — I509 Heart failure, unspecified: Secondary | ICD-10-CM | POA: Diagnosis not present

## 2020-05-25 DIAGNOSIS — E538 Deficiency of other specified B group vitamins: Secondary | ICD-10-CM | POA: Diagnosis not present

## 2020-05-25 DIAGNOSIS — E559 Vitamin D deficiency, unspecified: Secondary | ICD-10-CM | POA: Diagnosis not present

## 2020-05-28 DIAGNOSIS — I440 Atrioventricular block, first degree: Secondary | ICD-10-CM | POA: Diagnosis not present

## 2020-05-28 DIAGNOSIS — E7849 Other hyperlipidemia: Secondary | ICD-10-CM | POA: Diagnosis not present

## 2020-05-28 DIAGNOSIS — I10 Essential (primary) hypertension: Secondary | ICD-10-CM | POA: Diagnosis not present

## 2020-05-28 DIAGNOSIS — I5022 Chronic systolic (congestive) heart failure: Secondary | ICD-10-CM | POA: Diagnosis not present

## 2020-05-28 DIAGNOSIS — J449 Chronic obstructive pulmonary disease, unspecified: Secondary | ICD-10-CM | POA: Diagnosis not present

## 2020-05-28 DIAGNOSIS — R0602 Shortness of breath: Secondary | ICD-10-CM | POA: Diagnosis not present

## 2020-05-29 ENCOUNTER — Ambulatory Visit: Payer: Medicare Other

## 2020-06-03 ENCOUNTER — Telehealth (HOSPITAL_COMMUNITY): Payer: Self-pay | Admitting: Internal Medicine

## 2020-06-03 DIAGNOSIS — E7849 Other hyperlipidemia: Secondary | ICD-10-CM | POA: Diagnosis not present

## 2020-06-03 DIAGNOSIS — I10 Essential (primary) hypertension: Secondary | ICD-10-CM | POA: Diagnosis not present

## 2020-06-03 DIAGNOSIS — J449 Chronic obstructive pulmonary disease, unspecified: Secondary | ICD-10-CM | POA: Diagnosis not present

## 2020-06-03 DIAGNOSIS — I5022 Chronic systolic (congestive) heart failure: Secondary | ICD-10-CM | POA: Diagnosis not present

## 2020-06-03 NOTE — Telephone Encounter (Signed)
Paige [name], from Dr Donovan Kail called to f/u w/urgent referral sent in Epic on 05/04, please advise. Thanks

## 2020-06-03 NOTE — Telephone Encounter (Signed)
Roslyn please sch pt to see [facility] clinic next available appt please, thanks she was in the hospital in April

## 2020-06-10 DIAGNOSIS — H524 Presbyopia: Secondary | ICD-10-CM | POA: Diagnosis not present

## 2020-06-12 DIAGNOSIS — H524 Presbyopia: Secondary | ICD-10-CM | POA: Diagnosis not present

## 2020-06-15 ENCOUNTER — Encounter (HOSPITAL_COMMUNITY): Payer: Medicare Other

## 2020-06-15 NOTE — Progress Notes (Incomplete)
[facility]  PCP: Gildardo Cranker Primary Cardiologist:  HPI:  Paige [name] is a 75 y.o.  female  with a PMH significant for osteoarthritis, hyperlipidemia, GERD, depression, chronic lymphedema of the lower extremity, restless leg syndrome,  vitamin B12 deficiency, Newly diagnosed combined systolic and diastolic CHF, NICM   No prior cardiac history but presented to [place] ED 05/04/20 with progressive shortness of breath over the last few weeks, worsening LE edema.  Found to be in acute hypoxemic respiratory failure with oxygen saturation on arrival in the 80s.  Chest x-ray showed bilateral pulmonary edema with cardiomegaly and possible trace pleural effusions. Blood pressure 187/123 BNP 256.  She was started on IV diuretics.  An echo was performed which demonstrated EF 35-40%, G2DD, mild RV dysfunction.  Cardiology was consulted she underwent L/RHC which demonstrated normal coronary arteries, normal CO and normal pressures other than mild elevation in mean PA pressure to 24. Started on GDMT with Coreg 3.125 mg BID,, Losartan 25 mg daily Lasix 40 mg daily. GDMT limited by hypotension. Diuresed with weight 327->322.    ROS: All systems negative except as listed in HPI, PMH and Problem List.  SH:  Social History   Socioeconomic History  . Marital status: Widowed    Spouse name: Not on file  . Number of children: Not on file  . Years of education: Not on file  . Highest education level: Not on file  Occupational History  . Occupation: Retired  Tobacco Use  . Smoking status: Never Smoker  . Smokeless tobacco: Never Used  Substance and Sexual Activity  . Alcohol use: Yes    Comment: rarely  . Drug use: No  . Sexual activity: Not on file  Other Topics Concern  . Not on file  Social History Narrative  . Not on file   Social Determinants of Health   Financial Resource Strain: Not on file  Food Insecurity: Not on file  Transportation  Needs: Not on file  Physical Activity: Not on file  Stress: Not on file  Social Connections: Not on file  Intimate Partner Violence: Not on file    FH:  Family History  Problem Relation Age of Onset  . Hypertension Mother   . Depression Mother     Past Medical History:  Diagnosis Date  . Anemia   . Anxiety   . Arthritis   . Back pain   . Depression   . Fatigue   . Gallbladder problem   . GERD (gastroesophageal reflux disease)   . H/O hiatal hernia   . Hyperlipidemia 05/08/2020  . Hypertension    not being treated  . Joint pain   . Lymphedema of both lower extremities   . Obesity   . Osteoarthritis   . PONV (postoperative nausea and vomiting)    after Cholecysectomy  . Restless legs syndrome   . Vertigo   . Vitamin B 12 deficiency     Current Outpatient Medications  Medication Sig Dispense Refill  . atorvastatin (LIPITOR) 40 MG tablet Take 1 tablet (40 mg total) by mouth daily. 30 tablet 0  . buPROPion (WELLBUTRIN XL) 150 MG 24 hr tablet Take 150 mg by mouth daily.  4  . Calcium-Vitamin D 500-125 MG-UNIT TABS Take 1 tablet by mouth daily at 6 (six) AM.    . carvedilol (COREG) 3.125 MG tablet Take 1 tablet (3.125 mg total) by mouth 2 (two) times daily with a meal. 60 tablet 0  .  cholecalciferol (VITAMIN D3) 25 MCG (1000 UNIT) tablet Take 1,000 Units by mouth daily.    . Cyanocobalamin (VITAMIN B 12 PO) Take 1 tablet by mouth daily.    . furosemide (LASIX) 40 MG tablet Take 1 tablet (40 mg total) by mouth daily. 30 tablet 0  . losartan (COZAAR) 25 MG tablet Take 1 tablet (25 mg total) by mouth daily. 30 tablet 0  . naproxen sodium (ALEVE) 220 MG tablet Take 220 mg by mouth daily as needed (pain).    Marland Kitchen PARoxetine (PAXIL) 40 MG tablet Take 1 tablet (40 mg total) by mouth every morning. 30 tablet 0  . Phenylephrine HCl (SINEX REGULAR NA) Place 1 spray into both nostrils daily as needed (congestion).    . potassium chloride (KLOR-CON) 10 MEQ tablet Take 1 tablet (10 mEq  total) by mouth daily. 30 tablet 0  . pramipexole (MIRAPEX) 0.25 MG tablet Take 0.25 mg by mouth 2 (two) times daily.    . traZODone (DESYREL) 150 MG tablet Take 1 tablet (150 mg total) by mouth at bedtime. 30 tablet 0  . Vitamin D, Ergocalciferol, (DRISDOL) 1.25 MG (50000 UNIT) CAPS capsule Take 1 capsule (50,000 Units total) by mouth every Wednesday. 5 capsule 0   No current facility-administered medications for this visit.    There were no vitals filed for this visit.  PHYSICAL EXAM: ***   ECG   ASSESSMENT & PLAN: Combined Systolic and Diastolic CHF: -05/05/20 Echo was performed which demonstrated EF 35-40%, G2DD, mild RV dysfunction.   -L/RHC which demonstrated normal coronary arteries, normal CO and normal pressures other than mild elevation in mean PA pressure to 24.  -NYHA Class -Currently on Coreg 3.125 mg BID, Losartan 25 mg daily, Lasix 40 mg daily     Refer to Social Work:  PCP  Medications  Transportation   ETOH   Drug Abuse Programme researcher, broadcasting/film/video to Pharmacy:  Refer to Artist :  Refer to Home Health:   Refer to [facility]:  Refer to General Cardiology Other    Follow up

## 2020-06-23 NOTE — Progress Notes (Signed)
[facility]  PCP: Gildardo Cranker Primary Cardiologist: none  HPI:  Paige [name] is a 75 y.o.  female  with a PMH significant for osteoarthritis, hyperlipidemia, GERD, depression, chronic lymphedema of the lower extremity, restless leg syndrome,  vitamin B12 deficiency, Newly diagnosed combined systolic and diastolic CHF, NICM   No prior cardiac history but presented to [place] ED 05/04/20 with progressive shortness of breath over the last few months, worsening LE edema.  Found to be in acute hypoxemic respiratory failure with oxygen saturation on arrival in the 80s. Chest x-ray showed bilateral pulmonary edema with cardiomegaly and possible trace pleural effusions. Blood pressure 187/123 BNP 256. She was started on IV diuretics.  An echo was performed which demonstrated EF 35-40%, G2DD, mild RV dysfunction.  Cardiology was consulted she underwent L/RHC which demonstrated normal coronary arteries, normal CO and normal pressures other than mild elevation in mean PA pressure to 24. Started on GDMT with Coreg 3.125 mg BID, Losartan 25 mg daily Lasix 40 mg daily. GDMT limited by hypotension. Diuresed with weight 327->322.    Feeling pretty good since discharge, saw Dr. Algie Coffer 2 weeks after d/c and was switched from losartan to [facility] 24/26.  Breathing has been doing okay with supplemental oxygen without it says she is still dyspneic with exertioin.  Currently has reduced her activity levels.  Can put food in the oven but ordering a lot of ready made meals. Denies chest pain, orthopnea or PND.     ROS: All systems negative except as listed in HPI, PMH and Problem List.  SH:  Social History   Socioeconomic History  . Marital status: Widowed    Spouse name: Not on file  . Number of children: Not on file  . Years of education: Not on file  . Highest education level: Not on file  Occupational History  . Occupation: Retired  Tobacco Use  .  Smoking status: Never Smoker  . Smokeless tobacco: Never Used  Substance and Sexual Activity  . Alcohol use: Yes    Comment: rarely  . Drug use: No  . Sexual activity: Not on file  Other Topics Concern  . Not on file  Social History Narrative  . Not on file   Social Determinants of Health   Financial Resource Strain: Not on file  Food Insecurity: Not on file  Transportation Needs: Not on file  Physical Activity: Not on file  Stress: Not on file  Social Connections: Not on file  Intimate Partner Violence: Not on file    FH:  Family History  Problem Relation Age of Onset  . Hypertension Mother   . Depression Mother     Past Medical History:  Diagnosis Date  . Anemia   . Anxiety   . Arthritis   . Back pain   . Depression   . Fatigue   . Gallbladder problem   . GERD (gastroesophageal reflux disease)   . H/O hiatal hernia   . Hyperlipidemia 05/08/2020  . Hypertension    not being treated  . Joint pain   . Lymphedema of both lower extremities   . Obesity   . Osteoarthritis   . PONV (postoperative nausea and vomiting)    after Cholecysectomy  . Restless legs syndrome   . Vertigo   . Vitamin B 12 deficiency     Current Outpatient Medications  Medication Sig Dispense Refill  . atorvastatin (LIPITOR) 40 MG tablet Take 1 tablet (40 mg  total) by mouth daily. 30 tablet 0  . buPROPion (WELLBUTRIN XL) 150 MG 24 hr tablet Take 150 mg by mouth daily.  4  . Calcium-Vitamin D 500-125 MG-UNIT TABS Take 1 tablet by mouth daily at 6 (six) AM.    . carvedilol (COREG) 3.125 MG tablet Take 1 tablet (3.125 mg total) by mouth 2 (two) times daily with a meal. 60 tablet 0  . cholecalciferol (VITAMIN D3) 25 MCG (1000 UNIT) tablet Take 1,000 Units by mouth daily.    . Cyanocobalamin (VITAMIN B 12 PO) Take 1 tablet by mouth daily.    . furosemide (LASIX) 40 MG tablet Take 1 tablet (40 mg total) by mouth daily. 30 tablet 0  . PARoxetine (PAXIL) 40 MG tablet Take 1 tablet (40 mg total)  by mouth every morning. 30 tablet 0  . Phenylephrine HCl (SINEX REGULAR NA) Place 1 spray into both nostrils daily as needed (congestion).    . potassium chloride (KLOR-CON) 10 MEQ tablet Take 1 tablet (10 mEq total) by mouth daily. 30 tablet 0  . pramipexole (MIRAPEX) 0.25 MG tablet Take 0.25 mg by mouth 2 (two) times daily.    . sacubitril-valsartan (ENTRESTO) 24-26 MG Take 1 tablet by mouth 2 (two) times daily.    . traZODone (DESYREL) 150 MG tablet Take 1 tablet (150 mg total) by mouth at bedtime. 30 tablet 0  . Vitamin D, Ergocalciferol, (DRISDOL) 1.25 MG (50000 UNIT) CAPS capsule Take 1 capsule (50,000 Units total) by mouth every Wednesday. 5 capsule 0   No current facility-administered medications for this encounter.    Vitals:   06/25/20 1426  BP: 120/70  Pulse: 89  SpO2: 97%  Weight: (!) 142.7 kg (314 lb 8 oz)    PHYSICAL EXAM: Cardiac: JVD difficult to assess, normal rate and rhythm, clear s1 and s2, no murmurs, rubs or gallops, no LE edema Pulmonary: decreased breath sounds in bases with faint rales above, not in distress Abdominal: non distended abdomen, soft and nontender Psych: Alert, conversant, in good spirits    ASSESSMENT & PLAN:  Combined Systolic and Diastolic CHF: -05/05/20 Echo was performed which demonstrated EF 35-40%, G2DD, mild RV dysfunction.   -L/RHC which demonstrated normal coronary arteries, normal CO and normal pressures other than mild elevation in mean PA pressure to 24.  -NYHA Class III but also overweight and deconditioned, weight down 8 more lbs but discharged 05/08/20, volume status still elevated, REDS 29% but not made for BMI of 50 -We ambulated her with pulse oximeter and she did well.  Her room air O2 saturation went down to 91% with ambulation which is an improvement from 82% compared to her hospitalization.  She did several laps in the hallway and you could tell she was tired afterwards but not tachypneic and given her size did pretty well in  my opinion.  No longer requires oxygen.   -Currently on Coreg 3.125 mg BID, Losartan 25 mg daily, Lasix 40 mg daily -Add spiro 12.5mg  see her back in one week -high suspicion for OSA will need sleep study   Obesity: -discussed how much further she will improve with substantial weight loss, she is currently dieting   Fu with me in one week, will place a referral to General Cardiology

## 2020-06-25 ENCOUNTER — Other Ambulatory Visit: Payer: Self-pay

## 2020-06-25 ENCOUNTER — Ambulatory Visit (HOSPITAL_COMMUNITY)
Admission: RE | Admit: 2020-06-25 | Discharge: 2020-06-25 | Disposition: A | Discharge status: Home / Self Care | Payer: Medicare Other | Source: Ambulatory Visit | Attending: Internal Medicine | Admitting: Internal Medicine

## 2020-06-25 VITALS — BP 120/70 | HR 89 | Wt 314.5 lb

## 2020-06-25 DIAGNOSIS — I5042 Chronic combined systolic (congestive) and diastolic (congestive) heart failure: Secondary | ICD-10-CM | POA: Insufficient documentation

## 2020-06-25 DIAGNOSIS — Z6841 Body Mass Index (BMI) 40.0 and over, adult: Secondary | ICD-10-CM

## 2020-06-25 DIAGNOSIS — E669 Obesity, unspecified: Secondary | ICD-10-CM | POA: Diagnosis not present

## 2020-06-25 DIAGNOSIS — Z7901 Long term (current) use of anticoagulants: Secondary | ICD-10-CM | POA: Insufficient documentation

## 2020-06-25 DIAGNOSIS — G2581 Restless legs syndrome: Secondary | ICD-10-CM | POA: Insufficient documentation

## 2020-06-25 DIAGNOSIS — F419 Anxiety disorder, unspecified: Secondary | ICD-10-CM | POA: Insufficient documentation

## 2020-06-25 DIAGNOSIS — I110 Hypertensive heart disease with heart failure: Secondary | ICD-10-CM | POA: Insufficient documentation

## 2020-06-25 DIAGNOSIS — Z8249 Family history of ischemic heart disease and other diseases of the circulatory system: Secondary | ICD-10-CM | POA: Diagnosis not present

## 2020-06-25 DIAGNOSIS — I5041 Acute combined systolic (congestive) and diastolic (congestive) heart failure: Secondary | ICD-10-CM

## 2020-06-25 DIAGNOSIS — Z79899 Other long term (current) drug therapy: Secondary | ICD-10-CM | POA: Diagnosis not present

## 2020-06-25 DIAGNOSIS — E785 Hyperlipidemia, unspecified: Secondary | ICD-10-CM | POA: Diagnosis not present

## 2020-06-25 DIAGNOSIS — M1990 Unspecified osteoarthritis, unspecified site: Secondary | ICD-10-CM | POA: Insufficient documentation

## 2020-06-25 DIAGNOSIS — F32A Depression, unspecified: Secondary | ICD-10-CM | POA: Diagnosis not present

## 2020-06-25 LAB — BASIC METABOLIC PANEL
Anion gap: 6 (ref 5–15)
BUN: 17 mg/dL (ref 8–23)
CO2: 31 mmol/L (ref 22–32)
Calcium: 9.2 mg/dL (ref 8.9–10.3)
Chloride: 100 mmol/L (ref 98–111)
Creatinine, Ser: 0.98 mg/dL (ref 0.44–1.00)
GFR, Estimated: >60 mL/min (ref >60)
Glucose, Bld: 93 mg/dL (ref 70–99)
Potassium: 4.2 mmol/L (ref 3.5–5.1)
Sodium: 137 mmol/L (ref 135–145)

## 2020-06-25 LAB — BRAIN NATRIURETIC PEPTIDE: B Natriuretic Peptide: 46.2 pg/mL (ref 0.0–100.0)

## 2020-06-25 MED ORDER — SPIRONOLACTONE 25 MG PO TABS: 12.5000 mg | ORAL_TABLET | Freq: Every day | ORAL | 3 refills | Status: DC

## 2020-06-25 NOTE — Progress Notes (Signed)
ReDS Vest / Clip - 06/25/20 1400      ReDS Vest / Clip   Station Marker D    Ruler Value 36    ReDS Value Range Low volume    ReDS Actual Value 29

## 2020-06-25 NOTE — Patient Instructions (Signed)
Start Spironolactone 12.5 mg( 1/2 Tablet) daily  Labs done today, your results will be available in MyChart, we will contact you for abnormal readings  Thank you for allowing [place] to provider your heart failure care after your recent hospitalization. Please follow-up with [place] in 1 week  You have been referred to [facility], they will call you for an appointment   If you have any questions or concerns before your next appointment please send [place] a message through [place] or call our office at [phone].    TO LEAVE A MESSAGE FOR THE NURSE SELECT OPTION 2, PLEASE LEAVE A MESSAGE INCLUDING: . YOUR NAME . DATE OF BIRTH . CALL BACK NUMBER . REASON FOR CALL**this is important as we prioritize the call backs  YOU WILL RECEIVE A CALL BACK THE SAME DAY AS LONG AS YOU CALL BEFORE 4:00 PM At the [facility], you and your health needs are our priority. As part of our continuing mission to provide you with exceptional heart care, we have created designated Provider Care Teams. These Care Teams include your primary Cardiologist (physician) and Advanced Practice Providers (APPs- Physician Assistants and Nurse Practitioners) who all work together to provide you with the care you need, when you need it.   You may see any of the following providers on your designated Care Team at your next follow up: Marland Kitchen Dr Arvilla Meres . Dr Marca Ancona . Dr Thornell Mule . Tonye Becket, NP . Robbie Lis, PA . Shanda Bumps [place],NP . Karle Plumber, PharmD   Please be sure to bring in all your medications bottles to every appointment.

## 2020-06-26 ENCOUNTER — Encounter (HOSPITAL_COMMUNITY): Payer: Self-pay

## 2020-06-30 ENCOUNTER — Telehealth (HOSPITAL_COMMUNITY): Payer: Self-pay | Admitting: Surgery

## 2020-06-30 NOTE — Telephone Encounter (Signed)
Patient called and reminded about her appt in the [facility] tomorrow at 3pm.  She tells me that it is on her calendar and plan to be here.

## 2020-07-01 ENCOUNTER — Ambulatory Visit (HOSPITAL_COMMUNITY)
Admission: RE | Admit: 2020-07-01 | Discharge: 2020-07-01 | Disposition: A | Discharge status: Home / Self Care | Payer: Medicare Other | Source: Ambulatory Visit | Attending: Internal Medicine | Admitting: Internal Medicine

## 2020-07-01 ENCOUNTER — Encounter (HOSPITAL_COMMUNITY): Payer: Self-pay

## 2020-07-01 ENCOUNTER — Other Ambulatory Visit: Payer: Self-pay

## 2020-07-01 VITALS — BP 127/70 | HR 88 | Ht 67.0 in | Wt 310.6 lb

## 2020-07-01 DIAGNOSIS — E785 Hyperlipidemia, unspecified: Secondary | ICD-10-CM | POA: Diagnosis not present

## 2020-07-01 DIAGNOSIS — Z6841 Body Mass Index (BMI) 40.0 and over, adult: Secondary | ICD-10-CM | POA: Diagnosis not present

## 2020-07-01 DIAGNOSIS — Z79899 Other long term (current) drug therapy: Secondary | ICD-10-CM | POA: Insufficient documentation

## 2020-07-01 DIAGNOSIS — F32A Depression, unspecified: Secondary | ICD-10-CM | POA: Insufficient documentation

## 2020-07-01 DIAGNOSIS — E669 Obesity, unspecified: Secondary | ICD-10-CM | POA: Diagnosis not present

## 2020-07-01 DIAGNOSIS — D519 Vitamin B12 deficiency anemia, unspecified: Secondary | ICD-10-CM | POA: Insufficient documentation

## 2020-07-01 DIAGNOSIS — G2581 Restless legs syndrome: Secondary | ICD-10-CM | POA: Diagnosis not present

## 2020-07-01 DIAGNOSIS — M1990 Unspecified osteoarthritis, unspecified site: Secondary | ICD-10-CM | POA: Insufficient documentation

## 2020-07-01 DIAGNOSIS — I5042 Chronic combined systolic (congestive) and diastolic (congestive) heart failure: Secondary | ICD-10-CM | POA: Insufficient documentation

## 2020-07-01 DIAGNOSIS — I428 Other cardiomyopathies: Secondary | ICD-10-CM | POA: Diagnosis not present

## 2020-07-01 DIAGNOSIS — I110 Hypertensive heart disease with heart failure: Secondary | ICD-10-CM | POA: Insufficient documentation

## 2020-07-01 DIAGNOSIS — K219 Gastro-esophageal reflux disease without esophagitis: Secondary | ICD-10-CM | POA: Diagnosis not present

## 2020-07-01 DIAGNOSIS — I890 Lymphedema, not elsewhere classified: Secondary | ICD-10-CM | POA: Insufficient documentation

## 2020-07-01 LAB — BASIC METABOLIC PANEL
Anion gap: 9 (ref 5–15)
BUN: 23 mg/dL (ref 8–23)
CO2: 28 mmol/L (ref 22–32)
Calcium: 9 mg/dL (ref 8.9–10.3)
Chloride: 98 mmol/L (ref 98–111)
Creatinine, Ser: 1.11 mg/dL — ABNORMAL HIGH (ref 0.44–1.00)
GFR, Estimated: 52 mL/min — ABNORMAL LOW (ref >60)
Glucose, Bld: 97 mg/dL (ref 70–99)
Potassium: 4.2 mmol/L (ref 3.5–5.1)
Sodium: 135 mmol/L (ref 135–145)

## 2020-07-01 MED ORDER — FUROSEMIDE 40 MG PO TABS: 40.0000 mg | ORAL_TABLET | ORAL | 0 refills | Status: DC | PRN

## 2020-07-01 MED ORDER — EMPAGLIFLOZIN 10 MG PO TABS: 10.0000 mg | ORAL_TABLET | Freq: Every day | ORAL | 6 refills | Status: DC

## 2020-07-01 NOTE — Progress Notes (Signed)
Heart and Vascular Care Navigation  07/01/2020  Paige [name] [birth year] [id number]  Reason for Referral: Patient seen in HF TOC.   Engaged with patient face to face for initial visit for Heart and Vascular Care Coordination.                                                                                                   Assessment: Patient is a 75yo single female who lives alone in her own home. She states she has 3 cats that keep her company. Patient has BC/BS Medicare and denies any concerns with medical bills or obtaining her medications. Patient denies any concerns with housing , food, transportation or finances.                                   HRT/VAS Care Coordination     Patients Home Cardiology Office --  [facility]   Outpatient Care Team Social Worker   Social Worker Name: Lasandra Beech, [place] [phone]   Living arrangements for the past 2 months Single Family Home   Lives with: Self; Pets   Patient Current Insurance Coverage Managed Medicare   Patient Has Concern With Paying Medical Bills No   Does Patient Have Prescription Coverage? Yes   Home Assistive Devices/Equipment Scales   DME Agency [employer]   Current home services Housekeeping  Private Pay       Social History:                                                                             SDOH Screenings   Alcohol Screen: Not on file  Depression (SWF0-9): Not on file  Financial Resource Strain: Low Risk   . Difficulty of Paying Living Expenses: Not hard at all  Food Insecurity: No Food Insecurity  . Worried About Programme researcher, broadcasting/film/video in the Last Year: Never true  . Ran Out of Food in the Last Year: Never true  Housing: Low Risk   . Last Housing Risk Score: 0  Physical Activity: Not on file  Social Connections: Not on file  Stress: Not on file  Tobacco Use: Low Risk   . Smoking Tobacco Use: Never Smoker  . Smokeless Tobacco Use: Never Used  Transportation Needs: No Transportation Needs  . Lack  of Transportation (Medical): No  . Lack of Transportation (Non-Medical): No    SDOH Interventions: Financial Resources:  Corporate treasurer Interventions: Intervention Not Indicated n/a   Food Insecurity:  Food Insecurity Interventions: Intervention Not Indicated  Housing Insecurity:  Housing Interventions: Intervention Not Indicated  Transportation:   Transportation Interventions: Intervention Not Indicated   Follow-up plan:  Patient denies any concerns at this time. CSW available as needed. Lasandra Beech, LCSW, CCSW-MCS [phone]

## 2020-07-01 NOTE — Progress Notes (Addendum)
[facility] Transitions of Care Clinic  PCP: Gildardo Cranker Primary Cardiologist: none  HPI:  Paige [name] is a 75 y.o.  female  with a PMH significant for osteoarthritis, hyperlipidemia, GERD, depression, chronic lymphedema of the lower extremity, restless leg syndrome,  vitamin B12 deficiency, Newly diagnosed combined systolic and diastolic CHF, NICM    No prior cardiac history but presented to [place] ED 05/04/20 with progressive shortness of breath over the last few months, worsening LE edema.  Found to be in acute hypoxemic respiratory failure with oxygen saturation on arrival in the 80s.  Chest x-ray showed bilateral pulmonary edema with cardiomegaly and possible trace pleural effusions. Blood pressure 187/123 BNP 256.  She was started on IV diuretics.  An echo was performed which demonstrated EF 35-40%, G2DD, mild RV dysfunction.  Cardiology was consulted she underwent L/RHC which demonstrated normal coronary arteries, normal CO and normal pressures other than mild elevation in mean PA pressure to 24. Started on GDMT with Coreg 3.125 mg BID, Losartan 25 mg daily Lasix 40 mg daily. GDMT limited by hypotension. Diuresed with weight 327->322.    Feeling pretty good since discharge, saw Dr. Algie Coffer 2 weeks after d/c and was switched from losartan to [facility] 24/26.  Breathing has been doing okay with supplemental oxygen without it says she is still dyspneic with exertioin.  Currently has reduced her activity levels.  Can put food in the oven but ordering a lot of ready made meals. Denies chest pain, orthopnea or PND.     Doing adl's without issue, still getting short of breath with heavier and prolonged exertion but overall better, better than last week.  Pulse ox at home never goes below 91%.  Weight down 4 additional lbs compared to last week.    ROS: All systems negative except as listed in HPI, PMH and Problem List.  SH:  Social History   Socioeconomic History    Marital status: Widowed    Spouse name: Not on file   Number of children: Not on file   Years of education: Not on file   Highest education level: Not on file  Occupational History   Occupation: Retired  Tobacco Use   Smoking status: Never Smoker   Smokeless tobacco: Never Used  Substance and Sexual Activity   Alcohol use: Yes    Comment: rarely   Drug use: No   Sexual activity: Not on file  Other Topics Concern   Not on file  Social History Narrative   Not on file   Social Determinants of Health   Financial Resource Strain: Not on file  Food Insecurity: Not on file  Transportation Needs: Not on file  Physical Activity: Not on file  Stress: Not on file  Social Connections: Not on file  Intimate Partner Violence: Not on file    FH:  Family History  Problem Relation Age of Onset   Hypertension Mother    Depression Mother     Past Medical History:  Diagnosis Date   Anemia    Anxiety    Arthritis    Back pain    Depression    Fatigue    Gallbladder problem    GERD (gastroesophageal reflux disease)    H/O hiatal hernia    Hyperlipidemia 05/08/2020   Hypertension    not being treated   Joint pain    Lymphedema of both lower extremities    Obesity    Osteoarthritis    PONV (postoperative nausea and  vomiting)    after Cholecysectomy   Restless legs syndrome    Vertigo    Vitamin B 12 deficiency     Current Outpatient Medications  Medication Sig Dispense Refill   atorvastatin (LIPITOR) 40 MG tablet Take 1 tablet (40 mg total) by mouth daily. 30 tablet 0   buPROPion (WELLBUTRIN XL) 150 MG 24 hr tablet Take 150 mg by mouth daily.  4   Calcium-Vitamin D 500-125 MG-UNIT TABS Take 1 tablet by mouth daily at 6 (six) AM.     carvedilol (COREG) 3.125 MG tablet Take 1 tablet (3.125 mg total) by mouth 2 (two) times daily with a meal. 60 tablet 0   cholecalciferol (VITAMIN D3) 25 MCG (1000 UNIT) tablet Take 1,000 Units by mouth daily.     Cyanocobalamin (VITAMIN B 12  PO) Take 1 tablet by mouth daily.     furosemide (LASIX) 40 MG tablet Take 1 tablet (40 mg total) by mouth daily. 30 tablet 0   PARoxetine (PAXIL) 40 MG tablet Take 1 tablet (40 mg total) by mouth every morning. 30 tablet 0   Phenylephrine HCl (SINEX REGULAR NA) Place 1 spray into both nostrils daily as needed (congestion).     potassium chloride (KLOR-CON) 10 MEQ tablet Take 1 tablet (10 mEq total) by mouth daily. 30 tablet 0   pramipexole (MIRAPEX) 0.25 MG tablet Take 0.25 mg by mouth 2 (two) times daily.     sacubitril-valsartan (ENTRESTO) 24-26 MG Take 1 tablet by mouth 2 (two) times daily.     spironolactone (ALDACTONE) 25 MG tablet Take 0.5 tablets (12.5 mg total) by mouth daily. 30 tablet 3   traZODone (DESYREL) 150 MG tablet Take 1 tablet (150 mg total) by mouth at bedtime. 30 tablet 0   Vitamin D, Ergocalciferol, (DRISDOL) 1.25 MG (50000 UNIT) CAPS capsule Take 1 capsule (50,000 Units total) by mouth every Wednesday. 5 capsule 0   No current facility-administered medications for this encounter.    Vitals:   07/01/20 1509  BP: 127/70  Pulse: 88  SpO2: 96%  Weight: (!) 140.9 kg (310 lb 9.6 oz)  Height: 5\' 7"  (1.702 m)    PHYSICAL EXAM: Cardiac: no appreciable JVD although thick neck, normal rate and rhythm, clear s1 and s2, no murmurs, rubs or gallops, 1-2+ LE edema Pulmonary: interval improvement in aeration at bilateral bases with resolution of rales, not in distress Abdominal: non distended abdomen, soft and nontender Psych: Alert, conversant, in good spirits    ASSESSMENT & PLAN:  Combined Systolic and Diastolic CHF: -05/05/20 Echo was performed which demonstrated EF 35-40%, G2DD, mild RV dysfunction.   -L/RHC which demonstrated normal coronary arteries, normal CO and normal pressures other than mild elevation in mean PA pressure to 24.  -NYHA Class III but also overweight and deconditioned, weight down 4lbs since last visit and 12 additional lbs since discharge 05/08/20,  she is euvolemic today -no longer requiring oxygen with exertion as of last visit, but O2 saturation still not completely normal and I think we have achieved euvolemia will check again next visit, may need PFT's -Currently on Coreg 3.125 mg BID, Entresto 24/26, spiro 12.5mg , Lasix 40 mg daily -switch lasix to PRN and add jardiance 10mg  -high suspicion for OSA will need sleep study   Obesity: -discussed how much further she will improve with substantial weight loss, she is currently dieting, she will talk to her doctor about terzepatide   I will see her again in a few weeks due to switching lasix  to PRN to ensure she maintains volume status and check for persistent hypoxemia, Referral placed for her to establish with general cardiology.

## 2020-07-01 NOTE — Patient Instructions (Addendum)
Good to see you! Start Jardiance 10 mg daily Change Lasix 40 mg to as needed-- weight gain of 3 pounds in 24 hours or 5 lbs in a week. You have been referred to general cardiology --they will call you to schedule an appointment.

## 2020-07-02 ENCOUNTER — Encounter (HOSPITAL_COMMUNITY): Payer: Self-pay

## 2020-07-02 NOTE — Addendum Note (Signed)
Encounter addended by: Angelita Ingles, MD on: 07/02/2020 9:02 AM  Actions taken: Flowsheet accepted, Level of Service modified

## 2020-07-15 ENCOUNTER — Ambulatory Visit (HOSPITAL_COMMUNITY): Payer: Medicare Other

## 2020-07-15 DIAGNOSIS — Z1389 Encounter for screening for other disorder: Secondary | ICD-10-CM | POA: Diagnosis not present

## 2020-07-15 DIAGNOSIS — F339 Major depressive disorder, recurrent, unspecified: Secondary | ICD-10-CM | POA: Diagnosis not present

## 2020-07-15 DIAGNOSIS — Z0000 Encounter for general adult medical examination without abnormal findings: Secondary | ICD-10-CM | POA: Diagnosis not present

## 2020-07-15 DIAGNOSIS — Z23 Encounter for immunization: Secondary | ICD-10-CM | POA: Diagnosis not present

## 2020-07-15 DIAGNOSIS — E538 Deficiency of other specified B group vitamins: Secondary | ICD-10-CM | POA: Diagnosis not present

## 2020-07-15 DIAGNOSIS — G2581 Restless legs syndrome: Secondary | ICD-10-CM | POA: Diagnosis not present

## 2020-07-17 ENCOUNTER — Ambulatory Visit
Admission: RE | Admit: 2020-07-17 | Discharge: 2020-07-17 | Disposition: A | Discharge status: Home / Self Care | Payer: Medicare Other | Source: Ambulatory Visit | Attending: Family Medicine | Admitting: Family Medicine

## 2020-07-17 ENCOUNTER — Other Ambulatory Visit: Payer: Self-pay

## 2020-07-17 DIAGNOSIS — Z1231 Encounter for screening mammogram for malignant neoplasm of breast: Secondary | ICD-10-CM | POA: Diagnosis not present

## 2020-07-21 ENCOUNTER — Telehealth (HOSPITAL_COMMUNITY): Payer: Self-pay | Admitting: Surgery

## 2020-07-21 ENCOUNTER — Ambulatory Visit (HOSPITAL_COMMUNITY): Payer: Medicare Other

## 2020-07-21 NOTE — Telephone Encounter (Signed)
I called to remind patient of her upcoming scheduled appt in the [facility] tomorrow.  She acknowledges appt and plan to attend.

## 2020-07-22 ENCOUNTER — Ambulatory Visit (HOSPITAL_COMMUNITY)
Admission: RE | Admit: 2020-07-22 | Discharge: 2020-07-22 | Disposition: A | Discharge status: Home / Self Care | Payer: Medicare Other | Source: Ambulatory Visit | Attending: Internal Medicine | Admitting: Internal Medicine

## 2020-07-22 ENCOUNTER — Encounter (HOSPITAL_COMMUNITY): Payer: Self-pay

## 2020-07-22 ENCOUNTER — Other Ambulatory Visit: Payer: Self-pay

## 2020-07-22 VITALS — BP 120/94 | HR 68 | Wt 307.8 lb

## 2020-07-22 DIAGNOSIS — N1831 Chronic kidney disease, stage 3a: Secondary | ICD-10-CM | POA: Insufficient documentation

## 2020-07-22 DIAGNOSIS — Z713 Dietary counseling and surveillance: Secondary | ICD-10-CM | POA: Diagnosis not present

## 2020-07-22 DIAGNOSIS — I110 Hypertensive heart disease with heart failure: Secondary | ICD-10-CM | POA: Diagnosis not present

## 2020-07-22 DIAGNOSIS — D519 Vitamin B12 deficiency anemia, unspecified: Secondary | ICD-10-CM | POA: Insufficient documentation

## 2020-07-22 DIAGNOSIS — Z8249 Family history of ischemic heart disease and other diseases of the circulatory system: Secondary | ICD-10-CM | POA: Diagnosis not present

## 2020-07-22 DIAGNOSIS — I890 Lymphedema, not elsewhere classified: Secondary | ICD-10-CM | POA: Diagnosis not present

## 2020-07-22 DIAGNOSIS — I428 Other cardiomyopathies: Secondary | ICD-10-CM | POA: Insufficient documentation

## 2020-07-22 DIAGNOSIS — I5042 Chronic combined systolic (congestive) and diastolic (congestive) heart failure: Secondary | ICD-10-CM | POA: Diagnosis not present

## 2020-07-22 DIAGNOSIS — E785 Hyperlipidemia, unspecified: Secondary | ICD-10-CM | POA: Insufficient documentation

## 2020-07-22 DIAGNOSIS — F32A Depression, unspecified: Secondary | ICD-10-CM | POA: Insufficient documentation

## 2020-07-22 DIAGNOSIS — G2581 Restless legs syndrome: Secondary | ICD-10-CM | POA: Diagnosis not present

## 2020-07-22 DIAGNOSIS — Z6841 Body Mass Index (BMI) 40.0 and over, adult: Secondary | ICD-10-CM | POA: Diagnosis not present

## 2020-07-22 DIAGNOSIS — K219 Gastro-esophageal reflux disease without esophagitis: Secondary | ICD-10-CM | POA: Diagnosis not present

## 2020-07-22 DIAGNOSIS — Z7984 Long term (current) use of oral hypoglycemic drugs: Secondary | ICD-10-CM | POA: Diagnosis not present

## 2020-07-22 DIAGNOSIS — Z79899 Other long term (current) drug therapy: Secondary | ICD-10-CM | POA: Insufficient documentation

## 2020-07-22 DIAGNOSIS — E669 Obesity, unspecified: Secondary | ICD-10-CM | POA: Insufficient documentation

## 2020-07-22 DIAGNOSIS — M1990 Unspecified osteoarthritis, unspecified site: Secondary | ICD-10-CM | POA: Insufficient documentation

## 2020-07-22 LAB — BASIC METABOLIC PANEL
Anion gap: 5 (ref 5–15)
BUN: 19 mg/dL (ref 8–23)
CO2: 28 mmol/L (ref 22–32)
Calcium: 9.3 mg/dL (ref 8.9–10.3)
Chloride: 103 mmol/L (ref 98–111)
Creatinine, Ser: 0.96 mg/dL (ref 0.44–1.00)
GFR, Estimated: >60 mL/min (ref >60)
Glucose, Bld: 85 mg/dL (ref 70–99)
Potassium: 4.6 mmol/L (ref 3.5–5.1)
Sodium: 136 mmol/L (ref 135–145)

## 2020-07-22 MED ORDER — ENTRESTO 24-26 MG PO TABS: 1.0000 | ORAL_TABLET | Freq: Two times a day (BID) | ORAL | 0 refills | Status: DC

## 2020-07-22 MED ORDER — CARVEDILOL 6.25 MG PO TABS: 6.2500 mg | ORAL_TABLET | Freq: Two times a day (BID) | ORAL | 0 refills | Status: DC

## 2020-07-22 MED ORDER — SPIRONOLACTONE 25 MG PO TABS: 12.5000 mg | ORAL_TABLET | Freq: Every day | ORAL | 0 refills | Status: DC

## 2020-07-22 MED ORDER — EMPAGLIFLOZIN 10 MG PO TABS: 10.0000 mg | ORAL_TABLET | Freq: Every day | ORAL | 0 refills | Status: DC

## 2020-07-22 NOTE — Progress Notes (Signed)
[facility]  PCP: Gildardo Cranker Primary Cardiologist: none  HPI:  Paige [name] is a 75 y.o.  female  with a PMH significant for osteoarthritis, hyperlipidemia, GERD, depression, chronic lymphedema of the lower extremity, restless leg syndrome,  vitamin B12 deficiency, Newly diagnosed combined systolic and diastolic CHF, NICM    No prior cardiac history but presented to [place] ED 05/04/20 with progressive shortness of breath over the last few months, worsening LE edema.  Found to be in acute hypoxemic respiratory failure with oxygen saturation on arrival in the 80s.  Chest x-ray showed bilateral pulmonary edema with cardiomegaly and possible trace pleural effusions. Blood pressure 187/123 BNP 256.  She was started on IV diuretics.  An echo was performed which demonstrated EF 35-40%, G2DD, mild RV dysfunction.  Cardiology was consulted she underwent L/RHC which demonstrated normal coronary arteries, normal CO and normal pressures other than mild elevation in mean PA pressure to 24. Started on GDMT with Coreg 3.125 mg BID, Losartan 25 mg daily Lasix 40 mg daily. GDMT limited by hypotension. Diuresed with weight 327->322.    Last visit we added Jardiance 10, instructed her to turn in her supplemental oxygen as she no longer required this.  Switched lasix to PRN.       Here for follow up. Doing adl's without issue, still getting short of breath with heavier and prolonged exertion but overall better, 117/78 bp at home usually stays around 120/60-70's.  Weight down 3 additional lbs compared to last visit.  Taking lasix as needed but has been taking it for a week after she gained one pound.    ROS: All systems negative except as listed in HPI, PMH and Problem List.  SH:  Social History   Socioeconomic History   Marital status: Widowed    Spouse name: Not on file   Number of children: Not on file   Years of education: Not on file   Highest education  level: Not on file  Occupational History   Occupation: Retired  Tobacco Use   Smoking status: Never   Smokeless tobacco: Never  Substance and Sexual Activity   Alcohol use: Yes    Comment: rarely   Drug use: No   Sexual activity: Not on file  Other Topics Concern   Not on file  Social History Narrative   Not on file   Social Determinants of Health   Financial Resource Strain: Low Risk    Difficulty of Paying Living Expenses: Not hard at all  Food Insecurity: No Food Insecurity   Worried About Programme researcher, broadcasting/film/video in the Last Year: Never true   Ran Out of Food in the Last Year: Never true  Transportation Needs: No Transportation Needs   Lack of Transportation (Medical): No   Lack of Transportation (Non-Medical): No  Physical Activity: Not on file  Stress: Not on file  Social Connections: Not on file  Intimate Partner Violence: Not on file    FH:  Family History  Problem Relation Age of Onset   Hypertension Mother    Depression Mother     Past Medical History:  Diagnosis Date   Anemia    Anxiety    Arthritis    Back pain    Depression    Fatigue    Gallbladder problem    GERD (gastroesophageal reflux disease)    H/O hiatal hernia    Hyperlipidemia 05/08/2020   Hypertension    not being treated  Joint pain    Lymphedema of both lower extremities    Obesity    Osteoarthritis    PONV (postoperative nausea and vomiting)    after Cholecysectomy   Restless legs syndrome    Vertigo    Vitamin B 12 deficiency     Current Outpatient Medications  Medication Sig Dispense Refill   atorvastatin (LIPITOR) 40 MG tablet Take 1 tablet (40 mg total) by mouth daily. 30 tablet 0   buPROPion (WELLBUTRIN XL) 150 MG 24 hr tablet Take 150 mg by mouth daily.  4   Calcium-Vitamin D 500-125 MG-UNIT TABS Take 1 tablet by mouth daily at 6 (six) AM.     carvedilol (COREG) 3.125 MG tablet Take 1 tablet (3.125 mg total) by mouth 2 (two) times daily with a meal. 60 tablet 0    cholecalciferol (VITAMIN D3) 25 MCG (1000 UNIT) tablet Take 1,000 Units by mouth daily.     Cyanocobalamin (VITAMIN B 12 PO) Take 1 tablet by mouth daily.     empagliflozin (JARDIANCE) 10 MG TABS tablet Take 1 tablet (10 mg total) by mouth daily before breakfast. 30 tablet 6   furosemide (LASIX) 40 MG tablet Take 1 tablet (40 mg total) by mouth as needed for edema (weight gain of 3 pounds in 24 hours/ 5 pounds in a week). 30 tablet 0   PARoxetine (PAXIL) 40 MG tablet Take 1 tablet (40 mg total) by mouth every morning. 30 tablet 0   Phenylephrine HCl (SINEX REGULAR NA) Place 1 spray into both nostrils daily as needed (congestion).     potassium chloride (KLOR-CON) 10 MEQ tablet Take 1 tablet (10 mEq total) by mouth daily. 30 tablet 0   pramipexole (MIRAPEX) 0.25 MG tablet Take 0.25 mg by mouth 2 (two) times daily.     sacubitril-valsartan (ENTRESTO) 24-26 MG Take 1 tablet by mouth 2 (two) times daily.     spironolactone (ALDACTONE) 25 MG tablet Take 0.5 tablets (12.5 mg total) by mouth daily. 30 tablet 3   traZODone (DESYREL) 150 MG tablet Take 1 tablet (150 mg total) by mouth at bedtime. 30 tablet 0   Vitamin D, Ergocalciferol, (DRISDOL) 1.25 MG (50000 UNIT) CAPS capsule Take 1 capsule (50,000 Units total) by mouth every Wednesday. 5 capsule 0   No current facility-administered medications for this encounter.    Vitals:   07/22/20 1517  BP: (!) 120/94  Pulse: 68  SpO2: 96%  Weight: (!) 139.6 kg (307 lb 12.8 oz)    PHYSICAL EXAM: Cardiac: no appreciable JVD although thick neck, normal rate and rhythm, clear s1 and s2, no murmurs, rubs or gallops, 1-2+ LE edema Pulmonary: CTAB, not in distress Abdominal: non distended abdomen, soft and nontender Psych: Alert, conversant, in good spirits    ASSESSMENT & PLAN:  Combined Systolic and Diastolic CHF: -05/05/20 Echo was performed which demonstrated EF 35-40%, G2DD, mild RV dysfunction.   -L/RHC which demonstrated normal coronary arteries,  normal CO and normal pressures other than mild elevation in mean PA pressure to 24.  -NYHA Class III but also overweight and deconditioned, weight down 3lbs since last visit and 15 additional lbs since discharge 05/08/20, she is euvolemic today -no longer requiring oxygen with exertion as of last visit, but O2 saturation still not completely normal and I think we have achieved euvolemia will check again next visit, may need PFT's -Currently on Coreg 3.125 mg BID, Entresto 24/26, spiro 12.5mg , Lasix 40 mg PRN daily, jardiance 10mg  -Increase carvedilol to 6.25mg  BID, education  given on PRN lasix usage -high suspicion for OSA will need sleep study  Obesity: -discussed how much further she will improve with substantial weight loss, she is currently dieting, she talked to her doctor about tirzepatide but he did not order, also did not order a sleep study  FU with [facility] cardiology

## 2020-07-22 NOTE — Patient Instructions (Signed)
Labs done today. We will contact you only if your labs are abnormal.  INCREASE Carvedilol to 6.25mg (1 tablet) by mouth 2 times daily.   No other medication changes were made. Please continue all current medications as prescribed.

## 2020-07-24 ENCOUNTER — Other Ambulatory Visit (HOSPITAL_COMMUNITY): Payer: Self-pay | Admitting: Internal Medicine

## 2020-07-24 DIAGNOSIS — I5042 Chronic combined systolic (congestive) and diastolic (congestive) heart failure: Secondary | ICD-10-CM

## 2020-07-24 NOTE — Telephone Encounter (Signed)
This patient will be managed by [facility] Further refills will need to come from [facility]

## 2020-07-28 ENCOUNTER — Other Ambulatory Visit (HOSPITAL_COMMUNITY): Payer: Self-pay | Admitting: *Deleted

## 2020-07-28 ENCOUNTER — Other Ambulatory Visit (HOSPITAL_COMMUNITY): Payer: Self-pay | Admitting: Unknown Physician Specialty

## 2020-07-28 DIAGNOSIS — I5042 Chronic combined systolic (congestive) and diastolic (congestive) heart failure: Secondary | ICD-10-CM

## 2020-07-28 MED ORDER — EMPAGLIFLOZIN 10 MG PO TABS: 10.0000 mg | ORAL_TABLET | Freq: Every day | ORAL | 3 refills | Status: DC

## 2020-07-30 DIAGNOSIS — H90A31 Mixed conductive and sensorineural hearing loss, unilateral, right ear with restricted hearing on the contralateral side: Secondary | ICD-10-CM | POA: Diagnosis not present

## 2020-07-30 DIAGNOSIS — H90A22 Sensorineural hearing loss, unilateral, left ear, with restricted hearing on the contralateral side: Secondary | ICD-10-CM | POA: Diagnosis not present

## 2020-07-30 DIAGNOSIS — Z822 Family history of deafness and hearing loss: Secondary | ICD-10-CM | POA: Diagnosis not present

## 2020-08-07 ENCOUNTER — Other Ambulatory Visit: Payer: Self-pay

## 2020-08-07 DIAGNOSIS — M549 Dorsalgia, unspecified: Secondary | ICD-10-CM | POA: Insufficient documentation

## 2020-08-07 DIAGNOSIS — M2550 Pain in unspecified joint: Secondary | ICD-10-CM | POA: Insufficient documentation

## 2020-08-07 DIAGNOSIS — I890 Lymphedema, not elsewhere classified: Secondary | ICD-10-CM | POA: Insufficient documentation

## 2020-08-07 DIAGNOSIS — E538 Deficiency of other specified B group vitamins: Secondary | ICD-10-CM | POA: Insufficient documentation

## 2020-08-07 DIAGNOSIS — E669 Obesity, unspecified: Secondary | ICD-10-CM | POA: Insufficient documentation

## 2020-08-07 DIAGNOSIS — Z8719 Personal history of other diseases of the digestive system: Secondary | ICD-10-CM | POA: Insufficient documentation

## 2020-08-07 DIAGNOSIS — M1990 Unspecified osteoarthritis, unspecified site: Secondary | ICD-10-CM | POA: Insufficient documentation

## 2020-08-07 DIAGNOSIS — D649 Anemia, unspecified: Secondary | ICD-10-CM | POA: Insufficient documentation

## 2020-08-07 DIAGNOSIS — R5383 Other fatigue: Secondary | ICD-10-CM | POA: Insufficient documentation

## 2020-08-07 DIAGNOSIS — K829 Disease of gallbladder, unspecified: Secondary | ICD-10-CM | POA: Insufficient documentation

## 2020-08-07 DIAGNOSIS — R112 Nausea with vomiting, unspecified: Secondary | ICD-10-CM | POA: Insufficient documentation

## 2020-08-07 DIAGNOSIS — R42 Dizziness and giddiness: Secondary | ICD-10-CM | POA: Insufficient documentation

## 2020-08-07 DIAGNOSIS — Z9889 Other specified postprocedural states: Secondary | ICD-10-CM | POA: Insufficient documentation

## 2020-08-11 ENCOUNTER — Other Ambulatory Visit: Payer: Self-pay

## 2020-08-11 ENCOUNTER — Encounter: Payer: Self-pay | Admitting: Cardiology

## 2020-08-11 ENCOUNTER — Ambulatory Visit: Payer: Medicare Other | Admitting: Cardiology

## 2020-08-11 VITALS — BP 141/80 | HR 81 | Ht 68.0 in | Wt 309.0 lb

## 2020-08-11 DIAGNOSIS — I5041 Acute combined systolic (congestive) and diastolic (congestive) heart failure: Secondary | ICD-10-CM

## 2020-08-11 DIAGNOSIS — I10 Essential (primary) hypertension: Secondary | ICD-10-CM | POA: Diagnosis not present

## 2020-08-11 DIAGNOSIS — E782 Mixed hyperlipidemia: Secondary | ICD-10-CM

## 2020-08-11 NOTE — Progress Notes (Signed)
Cardiology Office Note:    Date:  08/11/2020   ID:  Paige [name], DOB 11-Apr-1945, MRN [id number]  PCP:  [name], MD  Cardiologist:  Garwin Brothers, MD   Referring MD: Angelita Ingles, MD    ASSESSMENT:    1. Systolic and diastolic CHF, acute (HCC)   2. Essential hypertension   3. Mixed hyperlipidemia    PLAN:    In order of problems listed above:  Primary prevention stressed with the patient.  Importance of compliance with diet medication stressed and she vocalized understanding. Essential hypertension: Blood pressure stable and diet was emphasized.  Lifestyle modification stressed.  She is on multiple medications and will get a Chem-7 today. Nonischemic cardiomyopathy: Patient is on guideline directed medical therapy.  Diet was emphasized.  Salt intake issues discussed.  She has had history of congestive heart failure.  Questions were answered to her satisfaction.  She was advised to weigh herself on a regular basis. Morbid obesity: Weight reduction was stressed.  Risks of obesity explained and she plans to do better. EKG done today reveals sinus rhythm and nonspecific ST-T changes. She will have an echocardiogram before next visit in 3 months.  Patient had multiple questions which were answered to her satisfaction.  I reviewed her records extensively.   Medication Adjustments/Labs and Tests Ordered: Current medicines are reviewed at length with the patient today.  Concerns regarding medicines are outlined above.  No orders of the defined types were placed in this encounter.  No orders of the defined types were placed in this encounter.    History of Present Illness:    Paige [name] is a 75 y.o. female who is being seen today for the evaluation of cardiomyopathy and congestive heart failure at the request of Winfrey, Kimberlee Nearing, MD. patient is a pleasant 75 year old female.  She has past medical history of moderately depressed ejection fraction and  congestive heart failure.  She was admitted to the hospital for the same reason her ejection fraction was evaluated to be 35 to 40%.  Coronary angiography was unremarkable.  There is impressions at the time of my evaluation, the patient is alert awake oriented and in no distress.  From the notes and I do not have the complete report.  At the time of my evaluation, the patient is alert awake oriented and in no distress.  Patient is here for follow-up and to get established.  Past Medical History:  Diagnosis Date   Acute exacerbation of CHF (congestive heart failure) (HCC) 05/04/2020   Acute respiratory failure with hypoxia (HCC) 05/08/2020   Anemia    Anxiety disorder 05/04/2020   Arthritis    Arthritis of knee 09/25/2013   Arthritis of knee, left 09/25/2013   Arthritis of right knee 02/10/2014   Back pain    Chronic kidney disease, stage 3a (HCC) 05/04/2020   Class 3 severe obesity with serious comorbidity and body mass index (BMI) of 45.0 to 49.9 in adult [facility]) 03/29/2018   Depression    Elevated troponin 05/08/2020   Esophageal reflux 05/04/2020   Essential hypertension 05/08/2020   Fatigue    Gallbladder problem    H/O hiatal hernia    High cholesterol 05/04/2020   Hyperlipidemia 05/08/2020   Insulin resistance 03/29/2018   Joint pain    Lymphedema of both lower extremities    Morbid obesity with BMI of 50.0-59.9, adult (HCC) 05/08/2020   Obesity    Osteoarthritis    Perennial allergic rhinitis 01/10/2018  PONV (postoperative nausea and vomiting)    after Cholecysectomy   Primary osteoarthritis of left hip 11/26/2015   Primary osteoarthritis of right knee 02/09/2014   Restless leg syndrome 05/08/2020   Rhinitis medicamentosa 01/10/2018   Systolic and diastolic CHF, acute (HCC) 05/08/2020   Vasomotor rhinitis 01/10/2018   Vertigo    Vitamin B 12 deficiency    Vitamin D deficiency 03/29/2018    Past Surgical History:  Procedure Laterality Date   ABDOMINAL HYSTERECTOMY     CHOLECYSTECTOMY      COLONOSCOPY     EYE SURGERY Bilateral    cataracts   FRACTURE SURGERY Right    arm age 32   FRACTURE SURGERY Bilateral    both thumbs   LAPAROSCOPIC GASTRIC BANDING     2010   OVARIAN CYST REMOVAL     RIGHT/LEFT HEART CATH AND CORONARY ANGIOGRAPHY N/A 05/07/2020   Procedure: RIGHT/LEFT HEART CATH AND CORONARY ANGIOGRAPHY;  Surgeon: Orpah Cobb, MD;  Location: [facility] INVASIVE CV LAB;  Service: Cardiovascular;  Laterality: N/A;   TOTAL HIP ARTHROPLASTY Left 11/30/2015   Procedure: TOTAL HIP ARTHROPLASTY ANTERIOR APPROACH;  Surgeon: Gean Birchwood, MD;  Location: [facility] OR;  Service: Orthopedics;  Laterality: Left;   TOTAL KNEE ARTHROPLASTY Left 09/25/2013   Procedure: TOTAL KNEE ARTHROPLASTY;  Surgeon: Nestor Lewandowsky, MD;  Location: [facility] OR;  Service: Orthopedics;  Laterality: Left;   TOTAL KNEE ARTHROPLASTY Right 02/10/2014   dr Turner Daniels   TOTAL KNEE ARTHROPLASTY Right 02/10/2014   Procedure: TOTAL KNEE ARTHROPLASTY;  Surgeon: Nestor Lewandowsky, MD;  Location: [facility] OR;  Service: Orthopedics;  Laterality: Right;    Current Medications: Current Meds  Medication Sig   atorvastatin (LIPITOR) 40 MG tablet Take 1 tablet (40 mg total) by mouth daily.   buPROPion (WELLBUTRIN XL) 150 MG 24 hr tablet Take 150 mg by mouth daily.   Calcium-Vitamin D 500-125 MG-UNIT TABS Take 1 tablet by mouth daily at 6 (six) AM.   carvedilol (COREG) 6.25 MG tablet Take 1 tablet (6.25 mg total) by mouth 2 (two) times daily with a meal.   cholecalciferol (VITAMIN D3) 25 MCG (1000 UNIT) tablet Take 1,000 Units by mouth daily.   Cyanocobalamin (VITAMIN B 12 PO) Take 1 tablet by mouth daily.   empagliflozin (JARDIANCE) 10 MG TABS tablet Take 1 tablet (10 mg total) by mouth daily before breakfast.   furosemide (LASIX) 40 MG tablet Take 1 tablet (40 mg total) by mouth as needed for edema (weight gain of 3 pounds in 24 hours/ 5 pounds in a week).   PARoxetine (PAXIL) 40 MG tablet Take 1 tablet (40 mg total) by mouth every morning.    Phenylephrine HCl (SINEX REGULAR NA) Place 1 spray into both nostrils daily as needed for congestion (congestion).   pramipexole (MIRAPEX) 0.25 MG tablet Take 0.25 mg by mouth 2 (two) times daily.   sacubitril-valsartan (ENTRESTO) 24-26 MG Take 1 tablet by mouth 2 (two) times daily.   spironolactone (ALDACTONE) 25 MG tablet Take 0.5 tablets (12.5 mg total) by mouth daily.   traZODone (DESYREL) 150 MG tablet Take 1 tablet (150 mg total) by mouth at bedtime.   Vitamin D, Ergocalciferol, (DRISDOL) 1.25 MG (50000 UNIT) CAPS capsule Take 1 capsule (50,000 Units total) by mouth every Wednesday.     Allergies:   No known allergies   Social History   Socioeconomic History   Marital status: Widowed    Spouse name: Not on file   Number of children: Not on file  Years of education: Not on file   Highest education level: Not on file  Occupational History   Occupation: Retired  Tobacco Use   Smoking status: Never   Smokeless tobacco: Never  Substance and Sexual Activity   Alcohol use: Yes    Comment: rarely   Drug use: No   Sexual activity: Not on file  Other Topics Concern   Not on file  Social History Narrative   Not on file   Social Determinants of Health   Financial Resource Strain: Low Risk    Difficulty of Paying Living Expenses: Not hard at all  Food Insecurity: No Food Insecurity   Worried About Programme researcher, broadcasting/film/video in the Last Year: Never true   Ran Out of Food in the Last Year: Never true  Transportation Needs: No Transportation Needs   Lack of Transportation (Medical): No   Lack of Transportation (Non-Medical): No  Physical Activity: Not on file  Stress: Not on file  Social Connections: Not on file     Family History: The patient's family history includes Depression in her mother; Hypertension in her mother.  ROS:   Please see the history of present illness.    All other systems reviewed and are negative.  EKGs/Labs/Other Studies Reviewed:    The following  studies were reviewed today: IMPRESSIONS     1. Left ventricular ejection fraction, by estimation, is 35 to 40%. The  left ventricle has moderately decreased function. Left ventricular  endocardial border not optimally defined to evaluate regional wall motion.  The left ventricular internal cavity  size was mildly dilated. Left ventricular diastolic parameters are  consistent with Grade II diastolic dysfunction (pseudonormalization).   2. Right ventricular systolic function is mildly reduced. The right  ventricular size is normal.   3. The mitral valve is grossly normal. Trivial mitral valve  regurgitation.   4. The aortic valve is grossly normal. Aortic valve regurgitation is not  visualized.    Recent Labs: 05/05/2020: TSH 2.941 05/08/2020: ALT 25; Hemoglobin 14.1; Magnesium 2.2; Platelets 189 06/25/2020: B Natriuretic Peptide 46.2 07/22/2020: BUN 19; Creatinine, Ser 0.96; Potassium 4.6; Sodium 136  Recent Lipid Panel    Component Value Date/Time   CHOL 137 05/08/2020 0133   CHOL 177 02/28/2018 1128   TRIG 83 05/08/2020 0133   HDL 40 (L) 05/08/2020 0133   HDL 56 02/28/2018 1128   CHOLHDL 3.4 05/08/2020 0133   VLDL 17 05/08/2020 0133   LDLCALC 80 05/08/2020 0133   LDLCALC 103 (H) 02/28/2018 1128    Physical Exam:    VS:  BP (!) 141/80   Pulse 81   Ht 5\' 8"  (1.727 m)   Wt (!) 309 lb (140.2 kg)   SpO2 95%   BMI 46.98 kg/m     Wt Readings from Last 3 Encounters:  08/11/20 (!) 309 lb (140.2 kg)  07/22/20 (!) 307 lb 12.8 oz (139.6 kg)  07/01/20 (!) 310 lb 9.6 oz (140.9 kg)     GEN: Patient is in no acute distress HEENT: Normal NECK: No JVD; No carotid bruits LYMPHATICS: No lymphadenopathy CARDIAC: S1 S2 regular, 2/6 systolic murmur at the apex. RESPIRATORY:  Clear to auscultation without rales, wheezing or rhonchi  ABDOMEN: Soft, non-tender, non-distended MUSCULOSKELETAL:  No edema; No deformity  SKIN: Warm and dry NEUROLOGIC:  Alert and oriented x 3 PSYCHIATRIC:   Normal affect    Signed, 08/31/20, MD  08/11/2020 9:51 AM    [facility]

## 2020-08-11 NOTE — Patient Instructions (Signed)
Medication Instructions:  No medication changes. *If you need a refill on your cardiac medications before your next appointment, please call your pharmacy*   Lab Work: Your physician recommends that you have a BMET done today in the office.  If you have labs (blood work) drawn today and your tests are completely normal, you will receive your results only by: MyChart Message (if you have MyChart) OR A paper copy in the mail If you have any lab test that is abnormal or we need to change your treatment, we will call you to review the results.   Testing/Procedures: Your physician has requested that you have an echocardiogram. Echocardiography is a painless test that uses sound waves to create images of your heart. It provides your doctor with information about the size and shape of your heart and how well your heart's chambers and valves are working. This procedure takes approximately one hour. There are no restrictions for this procedure.    Follow-Up: At [facility], you and your health needs are our priority.  As part of our continuing mission to provide you with exceptional heart care, we have created designated Provider Care Teams.  These Care Teams include your primary Cardiologist (physician) and Advanced Practice Providers (APPs -  Physician Assistants and Nurse Practitioners) who all work together to provide you with the care you need, when you need it.  We recommend signing up for the patient portal called "MyChart".  Sign up information is provided on this After Visit Summary.  MyChart is used to connect with patients for Virtual Visits (Telemedicine).  Patients are able to view lab/test results, encounter notes, upcoming appointments, etc.  Non-urgent messages can be sent to your provider as well.   To learn more about what you can do with MyChart, go to [email or web address].    Your next appointment:   3 month(s)  The format for your next appointment:   In  Person  Provider:   Belva Crome, MD   Other Instructions Echocardiogram An echocardiogram is a test that uses sound waves (ultrasound) to produce images of the heart. Images from an echocardiogram can provide important information about: Heart size and shape. The size and thickness and movement of your heart's walls. Heart muscle function and strength. Heart valve function or if you have stenosis. Stenosis is when the heart valves are too narrow. If blood is flowing backward through the heart valves (regurgitation). A tumor or infectious growth around the heart valves. Areas of heart muscle that are not working well because of poor blood flow or injury from a heart attack. Aneurysm detection. An aneurysm is a weak or damaged part of an artery wall. The wall bulges out from the normal force of blood pumping through the body. Tell a health care provider about: Any allergies you have. All medicines you are taking, including vitamins, herbs, eye drops, creams, and over-the-counter medicines. Any blood disorders you have. Any surgeries you have had. Any medical conditions you have. Whether you are pregnant or may be pregnant. What are the risks? Generally, this is a safe test. However, problems may occur, including an allergic reaction to dye (contrast) that may be used during the test. What happens before the test? No specific preparation is needed. You may eat and drink normally. What happens during the test? You will take off your clothes from the waist up and put on a hospital gown. Electrodes or electrocardiogram (ECG)patches may be placed on your chest. The electrodes or patches are then connected  to a device that monitors your heart rate and rhythm. You will lie down on a table for an ultrasound exam. A gel will be applied to your chest to help sound waves pass through your skin. A handheld device, called a transducer, will be pressed against your chest and moved over your  heart. The transducer produces sound waves that travel to your heart and bounce back (or "echo" back) to the transducer. These sound waves will be captured in real-time and changed into images of your heart that can be viewed on a video monitor. The images will be recorded on a computer and reviewed by your health care provider. You may be asked to change positions or hold your breath for a short time. This makes it easier to get different views or better views of your heart. In some cases, you may receive contrast through an IV in one of your veins. This can improve the quality of the pictures from your heart. The procedure may vary among health care providers and hospitals.   What can I expect after the test? You may return to your normal, everyday life, including diet, activities, and medicines, unless your health care provider tells you not to do that. Follow these instructions at home: It is up to you to get the results of your test. Ask your health care provider, or the department that is doing the test, when your results will be ready. Keep all follow-up visits. This is important. Summary An echocardiogram is a test that uses sound waves (ultrasound) to produce images of the heart. Images from an echocardiogram can provide important information about the size and shape of your heart, heart muscle function, heart valve function, and other possible heart problems. You do not need to do anything to prepare before this test. You may eat and drink normally. After the echocardiogram is completed, you may return to your normal, everyday life, unless your health care provider tells you not to do that. This information is not intended to replace advice given to you by your health care provider. Make sure you discuss any questions you have with your health care provider. Document Revised: 09/03/2019 Document Reviewed: 09/03/2019 Elsevier Patient Education  2021 [employer].

## 2020-08-12 LAB — BASIC METABOLIC PANEL
BUN/Creatinine Ratio: 17 (ref 12–28)
BUN: 16 mg/dL (ref 8–27)
CO2: 26 mmol/L (ref 20–29)
Calcium: 9.8 mg/dL (ref 8.7–10.3)
Chloride: 102 mmol/L (ref 96–106)
Creatinine, Ser: 0.93 mg/dL (ref 0.57–1.00)
Glucose: 102 mg/dL — ABNORMAL HIGH (ref 65–99)
Potassium: 5.2 mmol/L (ref 3.5–5.2)
Sodium: 140 mmol/L (ref 134–144)
eGFR: 64 mL/min/{1.73_m2} (ref >59)

## 2020-08-17 DIAGNOSIS — E538 Deficiency of other specified B group vitamins: Secondary | ICD-10-CM | POA: Diagnosis not present

## 2020-08-28 ENCOUNTER — Other Ambulatory Visit: Payer: Self-pay

## 2020-08-28 ENCOUNTER — Ambulatory Visit (HOSPITAL_COMMUNITY): Payer: Medicare Other | Attending: Cardiovascular Disease

## 2020-08-28 DIAGNOSIS — I5041 Acute combined systolic (congestive) and diastolic (congestive) heart failure: Secondary | ICD-10-CM | POA: Diagnosis not present

## 2020-08-28 DIAGNOSIS — R9430 Abnormal result of cardiovascular function study, unspecified: Secondary | ICD-10-CM | POA: Diagnosis not present

## 2020-08-28 LAB — ECHOCARDIOGRAM COMPLETE
Calc EF: 45.6 %
S' Lateral: 4.8 cm
Single Plane A2C EF: 43.7 %
Single Plane A4C EF: 44.3 %

## 2020-09-14 DIAGNOSIS — E538 Deficiency of other specified B group vitamins: Secondary | ICD-10-CM | POA: Diagnosis not present

## 2020-09-15 ENCOUNTER — Telehealth: Payer: Self-pay | Admitting: Cardiology

## 2020-09-15 NOTE — Telephone Encounter (Signed)
   Pt c/o medication issue:  1. Name of Medication: sacubitril-valsartan (ENTRESTO) 24-26 MG  2. How are you currently taking this medication (dosage and times per day)?   3. Are you having a reaction (difficulty breathing--STAT)?   4. What is your medication issue? Pt said she is in donut hole for her entresto, and would like to ask Dr. Tomie China if there's a cheaper alternative for entresto. She said it will cost her $400

## 2020-09-16 NOTE — Telephone Encounter (Signed)
Patient decided to pay for [facility]

## 2020-09-17 ENCOUNTER — Other Ambulatory Visit (HOSPITAL_COMMUNITY): Payer: Self-pay | Admitting: Internal Medicine

## 2020-09-17 DIAGNOSIS — I5042 Chronic combined systolic (congestive) and diastolic (congestive) heart failure: Secondary | ICD-10-CM

## 2020-10-12 ENCOUNTER — Institutional Professional Consult (permissible substitution): Payer: Medicare Other | Admitting: Cardiology

## 2020-10-23 ENCOUNTER — Telehealth: Payer: Self-pay | Admitting: Cardiology

## 2020-10-23 DIAGNOSIS — I5042 Chronic combined systolic (congestive) and diastolic (congestive) heart failure: Secondary | ICD-10-CM

## 2020-10-23 MED ORDER — CARVEDILOL 6.25 MG PO TABS: 6.2500 mg | ORAL_TABLET | Freq: Two times a day (BID) | ORAL | 2 refills | Status: DC

## 2020-10-23 NOTE — Telephone Encounter (Signed)
Refill of Carvedilol 6.25 mg sent to [facility] in [place].

## 2020-10-23 NOTE — Telephone Encounter (Signed)
*  STAT* If patient is at the pharmacy, call can be transferred to refill team.   1. Which medications need to be refilled? (please list name of each medication and dose if known) carvedilol (COREG) 6.25 MG tablet  2. Which pharmacy/location (including street and city if local pharmacy) is medication to be sent to? [id number] - [place], [place] - [address]  3. Do they need a 30 day or 90 day supply? 90 day

## 2020-11-04 DIAGNOSIS — H5319 Other subjective visual disturbances: Secondary | ICD-10-CM | POA: Diagnosis not present

## 2020-11-05 DIAGNOSIS — Z23 Encounter for immunization: Secondary | ICD-10-CM | POA: Diagnosis not present

## 2020-11-05 DIAGNOSIS — E538 Deficiency of other specified B group vitamins: Secondary | ICD-10-CM | POA: Diagnosis not present

## 2020-11-05 DIAGNOSIS — H539 Unspecified visual disturbance: Secondary | ICD-10-CM | POA: Diagnosis not present

## 2020-11-05 DIAGNOSIS — I509 Heart failure, unspecified: Secondary | ICD-10-CM | POA: Diagnosis not present

## 2020-11-10 ENCOUNTER — Other Ambulatory Visit: Payer: Self-pay

## 2020-11-11 ENCOUNTER — Other Ambulatory Visit (HOSPITAL_COMMUNITY): Payer: Self-pay | Admitting: Family Medicine

## 2020-11-11 ENCOUNTER — Other Ambulatory Visit: Payer: Self-pay | Admitting: Family Medicine

## 2020-11-11 DIAGNOSIS — H539 Unspecified visual disturbance: Secondary | ICD-10-CM

## 2020-11-15 ENCOUNTER — Ambulatory Visit (HOSPITAL_COMMUNITY)
Admission: RE | Admit: 2020-11-15 | Discharge: 2020-11-15 | Disposition: A | Discharge status: Home / Self Care | Payer: Medicare Other | Source: Ambulatory Visit | Attending: Family Medicine | Admitting: Family Medicine

## 2020-11-15 DIAGNOSIS — G311 Senile degeneration of brain, not elsewhere classified: Secondary | ICD-10-CM | POA: Diagnosis not present

## 2020-11-15 DIAGNOSIS — G9389 Other specified disorders of brain: Secondary | ICD-10-CM | POA: Diagnosis not present

## 2020-11-15 DIAGNOSIS — H539 Unspecified visual disturbance: Secondary | ICD-10-CM | POA: Insufficient documentation

## 2020-11-15 DIAGNOSIS — R930 Abnormal findings on diagnostic imaging of skull and head, not elsewhere classified: Secondary | ICD-10-CM | POA: Diagnosis not present

## 2020-11-15 DIAGNOSIS — G319 Degenerative disease of nervous system, unspecified: Secondary | ICD-10-CM | POA: Diagnosis not present

## 2020-11-15 MED ORDER — GADOBUTROL 1 MMOL/ML IV SOLN
10.0000 mL | Freq: Once | INTRAVENOUS | Status: AC | PRN
  Administered 2020-11-15: 10 mL via INTRAVENOUS

## 2020-11-19 ENCOUNTER — Ambulatory Visit: Payer: Medicare Other | Admitting: Cardiology

## 2020-11-19 ENCOUNTER — Encounter: Payer: Self-pay | Admitting: Cardiology

## 2020-11-19 ENCOUNTER — Other Ambulatory Visit: Payer: Self-pay

## 2020-11-19 VITALS — BP 130/78 | HR 80 | Ht 68.0 in | Wt 303.0 lb

## 2020-11-19 DIAGNOSIS — I428 Other cardiomyopathies: Secondary | ICD-10-CM

## 2020-11-19 NOTE — Patient Instructions (Signed)
Medication Instructions:  Your physician recommends that you continue on your current medications as directed. Please refer to the Current Medication list given to you today.  *If you need a refill on your cardiac medications before your next appointment, please call your pharmacy*   Lab Work: None ordered If you have labs (blood work) drawn today and your tests are completely normal, you will receive your results only by: MyChart Message (if you have MyChart) OR A paper copy in the mail If you have any lab test that is abnormal or we need to change your treatment, we will call you to review the results.   Testing/Procedures: Your physician has recommended that you have a defibrillator inserted. An implantable cardioverter defibrillator (ICD) is a small device that is placed in your chest or, in rare cases, your abdomen. This device uses electrical pulses or shocks to help control life-threatening, irregular heartbeats that could lead the heart to suddenly stop beating (sudden cardiac arrest). Leads are attached to the ICD that goes into your heart. This is done in the hospital and usually requires an overnight stay.   Please call the office if/when you would like to schedule this procedure.    Follow-Up: At [facility], you and your health needs are our priority.  As part of our continuing mission to provide you with exceptional heart care, we have created designated Provider Care Teams.  These Care Teams include your primary Cardiologist (physician) and Advanced Practice Providers (APPs -  Physician Assistants and Nurse Practitioners) who all work together to provide you with the care you need, when you need it.  Your next appointment:     as needed  The format for your next appointment:   In Person  Provider:   Loman Brooklyn, Paige [name]    Thank you for choosing [facility]!!   Dory Horn, RN [phone]   Other Instructions   Cardioverter Defibrillator  Implantation An implantable cardioverter defibrillator (ICD) is a device that identifies and corrects abnormal heart rhythms. Cardioverter defibrillator implantation is a surgery to place an ICD under the skin in the chest or abdomen. An ICD has a battery, a small computer (pulse generator), and wires (leads) that go into the heart. The ICD detects and corrects two types of dangerous irregular heart rhythms (arrhythmias): A rapid heart rhythm in the lower chambers of the heart (ventricles). This is called ventricular tachycardia. The ventricles contracting in an uncoordinated way. This is called ventricular fibrillation. There are different types of ICDs, and the electrical signals from the ICD can be programmed differently based on the condition being treated. The electrical signals from the ICD can be low-energy pulses, high-energy shocks, or a combination of the two. The low-energy pulses are generally used to restore the heartbeat to normal when it is either too slow (bradycardia) or too fast. These pulses are painless. The high-energy shocks are used to treat abnormal rhythms such as ventricular tachycardia or ventricular fibrillation. This shock may feel like a strong jolt in the chest. Your health care provider may recommend an ICD if you have: Had a ventricular arrhythmia in the past. A damaged heart because of a disease or heart condition. A weakened heart muscle from a heart attack or cardiac arrest. A congenital heart defect. Long QT syndrome, which is a disorder of the heart's electrical system. Brugada syndrome, which is a condition that causes a disruption of the heart's normal rhythm. Tell a health care provider about: Any allergies you have. All medicines  you are taking, including vitamins, herbs, eye drops, creams, and over-the-counter medicines. Any problems you or family members have had with anesthetic medicines. Any blood disorders you have. Any surgeries you have had. Any  medical conditions you have. Whether you are pregnant or may be pregnant. What are the risks? Generally, this is a safe procedure. However, problems may occur, including: Infection. Bleeding. Allergic reactions to medicines used during the procedure. Blood clots. Swelling or bruising. Damage to nearby structures or organs, such as nerves, lungs, blood vessels, or the heart where the ICD leads or pulse generator is implanted. What happens before the procedure? Staying hydrated Follow instructions from your health care provider about hydration, which may include: Up to 2 hours before the procedure - you may continue to drink clear liquids, such as water, clear fruit juice, black coffee, and plain tea.  Eating and drinking restrictions Follow instructions from your health care provider about eating and drinking, which may include: 8 hours before the procedure - stop eating heavy meals or foods, such as meat, fried foods, or fatty foods. 6 hours before the procedure - stop eating light meals or foods, such as toast or cereal. 6 hours before the procedure - stop drinking milk or drinks that contain milk. 2 hours before the procedure - stop drinking clear liquids. Medicines Ask your health care provider about: Changing or stopping your regular medicines. This is especially important if you are taking diabetes medicines or blood thinners. Taking medicines such as aspirin and ibuprofen. These medicines can thin your blood. Do not take these medicines unless your health care provider tells you to take them. Taking over-the-counter medicines, vitamins, herbs, and supplements. Tests You may have an exam or testing. These may include: Blood tests. A test to check the electrical signals in your heart (electrocardiogram, ECG). Imaging tests, such as a chest X-ray. Echocardiogram. This is an ultrasound of your heart to evaluate your heart structures and function. An event monitor or Holter monitor  to wear at home. General instructions Do not use any products that contain nicotine or tobacco for at least 4 weeks before the procedure. These products include cigarettes, chewing tobacco, and vaping devices, such as e-cigarettes. If you need help quitting, ask your health care provider. Ask your health care provider: How your procedure site will be marked. What steps will be taken to help prevent infection. These may include: Removing hair at the surgery site. Washing skin with a germ-killing soap. Taking antibiotic medicine. You may be asked to shower with a germ-killing soap. Plan to have a responsible adult take you home from the hospital or clinic. What happens during the procedure?  Small monitors will be put on your body. They will be used to check your heart rate, blood pressure, and oxygen level. A pair of sticky pads (defibrillator pads) may be placed on your back and chest. These pads are able to pace your heart as needed during the procedure. An IV will be inserted into one of your veins. You will be given one or more of the following: A medicine to help you relax (sedative). A medicine to numb the area (local anesthetic). A medicine to make you fall asleep(general anesthetic). A small incision will be made to create a deep pocket under the skin of your chest or abdomen. Leads will be guided through a blood vessel into your heart and attached to your heart muscles. Depending on the ICD, the leads may go into one ventricle, or they may go  into both ventricles and into an upper chamber of the heart. An X-ray machine (fluoroscope) will be used to help guide the leads. The other end of the leads will be attached to the pulse generator. The pulse generator will be placed into the pocket under the skin. The ICD will be tested, and your health care provider will program the ICD for the condition being treated. The incision will be closed with stitches (sutures), skin glue, adhesive  strips, or staples. A bandage (dressing) will be placed over the incision. The procedure may vary among health care providers and hospitals. What happens after the procedure? Your blood pressure, heart rate, breathing rate, and blood oxygen level will be monitored until you leave the hospital or clinic. Your health care provider will also monitor your ICD to make sure it is working properly. A chest X-ray will be taken to check that the ICD is in the right place. Do not raise the arm on the side of your procedure higher than your shoulder for as long as told by your health care provider. This is usually at least 6 weeks. You may be given an identification card explaining that you have an ICD. You will be given a remote home monitoring device to use with your ICD to allow your device to communicate with your clinic. Summary An implantable cardioverter defibrillator (ICD) is a device that identifies and corrects abnormal heart rhythms. Cardioverter defibrillator implantation is a surgery to place an ICD under the skin in the chest or abdomen. An ICD consists of a battery, a small computer (pulse generator), and wires (leads) that go into the heart. During the procedure, the ICD will be tested, and your health care provider will program the ICD for the condition being treated. After the procedure, a chest X-ray will be taken to check that the ICD is in the right place. This information is not intended to replace advice given to you by your health care provider. Make sure you discuss any questions you have with your health care provider. Document Revised: 07/10/2019 Document Reviewed: 07/10/2019 Elsevier Patient Education  2022 [employer].

## 2020-11-19 NOTE — Progress Notes (Signed)
Electrophysiology Office Note   Date:  11/19/2020   ID:  Paige [name], Paige [name] 02/22/1945, MRN [id number]  PCP:  [name], MD  Cardiologist:  Revankar Primary Electrophysiologist:  Adebayo Ensminger Jorja Loa, MD    Chief Complaint: CHF   History of Present Illness: Paige [name] is a 75 y.o. female who is being seen today for the evaluation of CHF at the request of Revankar, Aundra Dubin, MD. Presenting today for electrophysiology evaluation.  She has a history significant for chronic systolic heart failure, CKD stage IIIa, morbid obesity, hypertension, hyperlipidemia.  She had coronary angiography which has been reported to be normal.  Today, she denies symptoms of palpitations, chest pain, shortness of breath, orthopnea, PND, lower extremity edema, claudication, dizziness, presyncope, syncope, bleeding, or neurologic sequela. The patient is tolerating medications without difficulties.  She currently feels well.  She feels that being on her heart failure medications has improved her symptoms.  She is able to do most of her daily activities.  She is overall happy with the way that she has been feeling.  She does not have much shortness of breath with exertion.   Past Medical History:  Diagnosis Date   Acute exacerbation of CHF (congestive heart failure) (HCC) 05/04/2020   Acute respiratory failure with hypoxia (HCC) 05/08/2020   Anemia    Anxiety disorder 05/04/2020   Arthritis    Arthritis of knee 09/25/2013   Arthritis of knee, left 09/25/2013   Arthritis of right knee 02/10/2014   Back pain    Chronic kidney disease, stage 3a (HCC) 05/04/2020   Class 3 severe obesity with serious comorbidity and body mass index (BMI) of 45.0 to 49.9 in adult (HCC) 03/29/2018   Depression    Elevated troponin 05/08/2020   Esophageal reflux 05/04/2020   Essential hypertension 05/08/2020   Fatigue    Gallbladder problem    H/O hiatal hernia    High cholesterol 05/04/2020   Hyperlipidemia 05/08/2020    Insulin resistance 03/29/2018   Joint pain    Lymphedema of both lower extremities    Morbid obesity with BMI of 50.0-59.9, adult (HCC) 05/08/2020   Obesity    Osteoarthritis    Perennial allergic rhinitis 01/10/2018   PONV (postoperative nausea and vomiting)    after Cholecysectomy   Primary osteoarthritis of left hip 11/26/2015   Primary osteoarthritis of right knee 02/09/2014   Restless leg syndrome 05/08/2020   Rhinitis medicamentosa 01/10/2018   Systolic and diastolic CHF, acute (HCC) 05/08/2020   Vasomotor rhinitis 01/10/2018   Vertigo    Vitamin B 12 deficiency    Vitamin D deficiency 03/29/2018   Past Surgical History:  Procedure Laterality Date   ABDOMINAL HYSTERECTOMY     CHOLECYSTECTOMY     COLONOSCOPY     EYE SURGERY Bilateral    cataracts   FRACTURE SURGERY Right    arm age 89   FRACTURE SURGERY Bilateral    both thumbs   LAPAROSCOPIC GASTRIC BANDING     2010   OVARIAN CYST REMOVAL     RIGHT/LEFT HEART CATH AND CORONARY ANGIOGRAPHY N/A 05/07/2020   Procedure: RIGHT/LEFT HEART CATH AND CORONARY ANGIOGRAPHY;  Surgeon: Orpah Cobb, MD;  Location: [facility] INVASIVE [facility] LAB;  Service: Cardiovascular;  Laterality: N/A;   TOTAL HIP ARTHROPLASTY Left 11/30/2015   Procedure: TOTAL HIP ARTHROPLASTY ANTERIOR APPROACH;  Surgeon: Gean Birchwood, MD;  Location: [facility] OR;  Service: Orthopedics;  Laterality: Left;   TOTAL KNEE ARTHROPLASTY Left 09/25/2013   Procedure: TOTAL  KNEE ARTHROPLASTY;  Surgeon: Nestor Lewandowsky, MD;  Location: [facility] OR;  Service: Orthopedics;  Laterality: Left;   TOTAL KNEE ARTHROPLASTY Right 02/10/2014   dr Turner Daniels   TOTAL KNEE ARTHROPLASTY Right 02/10/2014   Procedure: TOTAL KNEE ARTHROPLASTY;  Surgeon: Nestor Lewandowsky, MD;  Location: [facility] OR;  Service: Orthopedics;  Laterality: Right;     Current Outpatient Medications  Medication Sig Dispense Refill   aspirin 81 MG chewable tablet Chew by mouth daily.     atorvastatin (LIPITOR) 40 MG tablet Take 1 tablet (40 mg total) by mouth  daily. 30 tablet 0   Calcium-Vitamin D 500-125 MG-UNIT TABS Take 1 tablet by mouth daily at 6 (six) AM.     carvedilol (COREG) 3.125 MG tablet Take 3.125 mg by mouth 2 (two) times daily.     cholecalciferol (VITAMIN D3) 25 MCG (1000 UNIT) tablet Take 1,000 Units by mouth daily.     Cyanocobalamin (VITAMIN B 12 PO) Take 1 tablet by mouth daily.     empagliflozin (JARDIANCE) 10 MG TABS tablet Take 1 tablet (10 mg total) by mouth daily before breakfast. 90 tablet 3   furosemide (LASIX) 40 MG tablet Take 1 tablet (40 mg total) by mouth as needed for edema (weight gain of 3 pounds in 24 hours/ 5 pounds in a week). 30 tablet 0   PARoxetine (PAXIL) 20 MG tablet Take 20 mg by mouth every morning.     Phenylephrine HCl (SINEX REGULAR NA) Place 1 spray into both nostrils daily as needed for congestion (congestion).     potassium chloride (KLOR-CON) 10 MEQ tablet Take 10 mEq by mouth daily.     pramipexole (MIRAPEX) 0.25 MG tablet Take 0.25 mg by mouth 2 (two) times daily.     sacubitril-valsartan (ENTRESTO) 24-26 MG Take 1 tablet by mouth 2 (two) times daily. 180 tablet 0   spironolactone (ALDACTONE) 25 MG tablet Take 0.5 tablets (12.5 mg total) by mouth daily. 45 tablet 0   traZODone (DESYREL) 150 MG tablet Take 1 tablet (150 mg total) by mouth at bedtime. 30 tablet 0   Vitamin D, Ergocalciferol, (DRISDOL) 1.25 MG (50000 UNIT) CAPS capsule Take 1 capsule (50,000 Units total) by mouth every Wednesday. 5 capsule 0   No current facility-administered medications for this visit.    Allergies:   No known allergies   Social History:  The patient  reports that she has never smoked. She has never used smokeless tobacco. She reports current alcohol use. She reports that she does not use drugs.   Family History:  The patient's family history includes Depression in her mother; Hypertension in her mother.    ROS:  Please see the history of present illness.   Otherwise, review of systems is positive for none.    All other systems are reviewed and negative.    PHYSICAL EXAM: VS:  BP 130/78   Pulse 80   Ht 5\' 8"  (1.727 m)   Wt (!) 303 lb (137.4 kg)   SpO2 98%   BMI 46.07 kg/m  , BMI Body mass index is 46.07 kg/m. GEN: Well nourished, well developed, in no acute distress  HEENT: normal  Neck: no JVD, carotid bruits, or masses Cardiac: RRR; no murmurs, rubs, or gallops,no edema  Respiratory:  clear to auscultation bilaterally, normal work of breathing GI: soft, nontender, nondistended, + BS MS: no deformity or atrophy  Skin: warm and dry Neuro:  Strength and sensation are intact Psych: euthymic mood, full affect  EKG:  EKG is ordered today. Personal review of the ekg ordered shows sinus rhythm, PVCs, rate 80  Recent Labs: 05/05/2020: TSH 2.941 05/08/2020: ALT 25; Hemoglobin 14.1; Magnesium 2.2; Platelets 189 06/25/2020: B Natriuretic Peptide 46.2 08/11/2020: BUN 16; Creatinine, Ser 0.93; Potassium 5.2; Sodium 140    Lipid Panel     Component Value Date/Time   CHOL 137 05/08/2020 0133   CHOL 177 02/28/2018 1128   TRIG 83 05/08/2020 0133   HDL 40 (L) 05/08/2020 0133   HDL 56 02/28/2018 1128   CHOLHDL 3.4 05/08/2020 0133   VLDL 17 05/08/2020 0133   LDLCALC 80 05/08/2020 0133   LDLCALC 103 (H) 02/28/2018 1128     Wt Readings from Last 3 Encounters:  11/19/20 (!) 303 lb (137.4 kg)  08/11/20 (!) 309 lb (140.2 kg)  07/22/20 (!) 307 lb 12.8 oz (139.6 kg)      Other studies Reviewed: Additional studies/ records that were reviewed today include: TTE 08/28/20  Review of the above records today demonstrates:   1. EF similar to TTE done 05/05/20 moderately reduced . Left ventricular  ejection fraction, by estimation, is 30 to 35%. The left ventricle has  moderately decreased function. The left ventricle demonstrates global  hypokinesis. The left ventricular  internal cavity size was mildly dilated. Left ventricular diastolic  parameters are consistent with Grade I diastolic dysfunction  (impaired  relaxation).   2. Right ventricular systolic function is normal. The right ventricular  size is normal.   3. The mitral valve is normal in structure. No evidence of mitral valve  regurgitation. No evidence of mitral stenosis.   4. The aortic valve is normal in structure. Aortic valve regurgitation is  not visualized. No aortic stenosis is present.   5. The inferior vena cava is normal in size with greater than 50%  respiratory variability, suggesting right atrial pressure of 3 mmHg.    ASSESSMENT AND PLAN:  1.  Chronic systolic heart failure due to nonischemic cardiomyopathy: Currently on optimal medical therapy with carvedilol 3.125 mg twice daily, Entresto 24/26 mg twice daily, Aldactone 25 mg daily, Jardiance 10 mg daily.  We spoke about further options to manage her heart failure.  She would benefit from an ICD.  Risks and benefits of the procedure were discussed.  She understand these risks, but is still hesitant to have the procedure done.  We Stanislaw Acton give her information on ICD implant.  She Shishir Krantz call us back if she feels that this is a good option for her.  2.  Hypertension: Currently well controlled  Case discussed with primary cardiology  Current medicines are reviewed at length with the patient today.   The patient does not have concerns regarding her medicines.  The following changes were made today:  none  Labs/ tests ordered today include:  Orders Placed This Encounter  Procedures   EKG 12-Lead      Disposition:   FU with Leila Schuff as needed d  Signed, Emberli Ballester Jorja Loa, MD  11/19/2020 12:38 PM     [facility] [address] [place] [address] [phone] (office) [phone] (fax)

## 2020-11-24 ENCOUNTER — Ambulatory Visit (INDEPENDENT_AMBULATORY_CARE_PROVIDER_SITE_OTHER): Payer: Medicare Other

## 2020-11-24 ENCOUNTER — Encounter: Payer: Self-pay | Admitting: Cardiology

## 2020-11-24 ENCOUNTER — Ambulatory Visit: Payer: Medicare Other | Admitting: Cardiology

## 2020-11-24 ENCOUNTER — Other Ambulatory Visit: Payer: Self-pay

## 2020-11-24 VITALS — BP 118/72 | HR 62 | Ht 68.0 in | Wt 304.1 lb

## 2020-11-24 DIAGNOSIS — R002 Palpitations: Secondary | ICD-10-CM | POA: Insufficient documentation

## 2020-11-24 DIAGNOSIS — E782 Mixed hyperlipidemia: Secondary | ICD-10-CM

## 2020-11-24 DIAGNOSIS — I10 Essential (primary) hypertension: Secondary | ICD-10-CM

## 2020-11-24 DIAGNOSIS — E559 Vitamin D deficiency, unspecified: Secondary | ICD-10-CM

## 2020-11-24 DIAGNOSIS — Z6841 Body Mass Index (BMI) 40.0 and over, adult: Secondary | ICD-10-CM

## 2020-11-24 HISTORY — DX: Palpitations: R00.2

## 2020-11-24 NOTE — Progress Notes (Signed)
Cardiology Office Note:    Date:  11/24/2020   ID:  Paige [name], Paige [name] 10-28-1945, MRN [id number]  PCP:  [name], MD  Cardiologist:  Garwin Brothers, MD   Referring MD: [name], MD    ASSESSMENT:    1. Essential hypertension   2. Palpitations   3. Mixed hyperlipidemia   4. Morbid obesity with BMI of 50.0-59.9, adult (HCC)    PLAN:    In order of problems listed above:  Primary prevention stressed with the patient.  Importance of compliance with diet medication stressed and she vocalized understanding. Nonischemic cardiomyopathy: Patient is on guideline directed medical therapy.  Electrophysiology notes reviewed.  She is not keen on any evaluation further for defibrillator therapy.  Risks explained I understand and respect her wishes. Palpitations and possibly TIA: I discussed 24-hour event monitoring and she is agreeable.  This will be for 2 weeks. Mixed dyslipidemia and morbid obesity: Diet was emphasized lifestyle modification urged risks of obesity explained and she promises to do better.  She will get complete blood work done here in the next few days including TSH which will help me assess the issue of palpitations. Essential hypertension: Blood pressure stable and lifestyle modification was urged. Patient will be seen in follow-up appointment in 6 months or earlier if the patient has any concerns    Medication Adjustments/Labs and Tests Ordered: Current medicines are reviewed at length with the patient today.  Concerns regarding medicines are outlined above.  No orders of the defined types were placed in this encounter.  No orders of the defined types were placed in this encounter.    No chief complaint on file.    History of Present Illness:    Paige [name] is a 75 y.o. female.  Patient has past medical history of essential hypertension, mixed dyslipidemia, nonischemic cardiomyopathy and morbid obesity.  She denies any problems at this  time and takes care of activities of daily living.  No chest pain orthopnea or PND.  She occasionally has issues with palpitations.  She saw her neurologist recently for what appears like possibly a TIA like episodes.  At the time of my evaluation, the patient is alert awake oriented and in no distress.  Past Medical History:  Diagnosis Date   Acute exacerbation of CHF (congestive heart failure) (HCC) 05/04/2020   Acute respiratory failure with hypoxia (HCC) 05/08/2020   Anemia    Anxiety disorder 05/04/2020   Arthritis    Arthritis of knee 09/25/2013   Arthritis of knee, left 09/25/2013   Arthritis of right knee 02/10/2014   Back pain    Chronic kidney disease, stage 3a (HCC) 05/04/2020   Class 3 severe obesity with serious comorbidity and body mass index (BMI) of 45.0 to 49.9 in adult (HCC) 03/29/2018   Depression    Elevated troponin 05/08/2020   Esophageal reflux 05/04/2020   Essential hypertension 05/08/2020   Fatigue    Gallbladder problem    H/O hiatal hernia    High cholesterol 05/04/2020   Hyperlipidemia 05/08/2020   Insulin resistance 03/29/2018   Joint pain    Lymphedema of both lower extremities    Morbid obesity with BMI of 50.0-59.9, adult (HCC) 05/08/2020   Obesity    Osteoarthritis    Perennial allergic rhinitis 01/10/2018   PONV (postoperative nausea and vomiting)    after Cholecysectomy   Primary osteoarthritis of left hip 11/26/2015   Primary osteoarthritis of right knee 02/09/2014   Restless leg  syndrome 05/08/2020   Rhinitis medicamentosa 01/10/2018   Systolic and diastolic CHF, acute (HCC) 05/08/2020   Vasomotor rhinitis 01/10/2018   Vertigo    Vitamin B 12 deficiency    Vitamin D deficiency 03/29/2018    Past Surgical History:  Procedure Laterality Date   ABDOMINAL HYSTERECTOMY     CHOLECYSTECTOMY     COLONOSCOPY     EYE SURGERY Bilateral    cataracts   FRACTURE SURGERY Right    arm age 28   FRACTURE SURGERY Bilateral    both thumbs   LAPAROSCOPIC GASTRIC BANDING      2010   OVARIAN CYST REMOVAL     RIGHT/LEFT HEART CATH AND CORONARY ANGIOGRAPHY N/A 05/07/2020   Procedure: RIGHT/LEFT HEART CATH AND CORONARY ANGIOGRAPHY;  Surgeon: Orpah Cobb, MD;  Location: [facility];  Service: Cardiovascular;  Laterality: N/A;   TOTAL HIP ARTHROPLASTY Left 11/30/2015   Procedure: TOTAL HIP ARTHROPLASTY ANTERIOR APPROACH;  Surgeon: Gean Birchwood, MD;  Location: [facility] OR;  Service: Orthopedics;  Laterality: Left;   TOTAL KNEE ARTHROPLASTY Left 09/25/2013   Procedure: TOTAL KNEE ARTHROPLASTY;  Surgeon: Nestor Lewandowsky, MD;  Location: [facility] OR;  Service: Orthopedics;  Laterality: Left;   TOTAL KNEE ARTHROPLASTY Right 02/10/2014   dr Turner Daniels   TOTAL KNEE ARTHROPLASTY Right 02/10/2014   Procedure: TOTAL KNEE ARTHROPLASTY;  Surgeon: Nestor Lewandowsky, MD;  Location: [facility] OR;  Service: Orthopedics;  Laterality: Right;    Current Medications: Current Meds  Medication Sig   aspirin 81 MG chewable tablet Chew 81 mg by mouth daily.   atorvastatin (LIPITOR) 40 MG tablet Take 1 tablet (40 mg total) by mouth daily.   Calcium-Vitamin D 500-125 MG-UNIT TABS Take 1 tablet by mouth daily at 6 (six) AM.   carvedilol (COREG) 3.125 MG tablet Take 3.125 mg by mouth 2 (two) times daily.   cholecalciferol (VITAMIN D3) 25 MCG (1000 UNIT) tablet Take 1,000 Units by mouth daily.   Cyanocobalamin (VITAMIN B 12 PO) Take 1 tablet by mouth daily.   empagliflozin (JARDIANCE) 10 MG TABS tablet Take 1 tablet (10 mg total) by mouth daily before breakfast.   furosemide (LASIX) 40 MG tablet Take 1 tablet (40 mg total) by mouth as needed for edema (weight gain of 3 pounds in 24 hours/ 5 pounds in a week).   PARoxetine (PAXIL) 20 MG tablet Take 20 mg by mouth every morning.   Phenylephrine HCl (SINEX REGULAR NA) Place 1 spray into both nostrils daily as needed for congestion (congestion).   potassium chloride (KLOR-CON) 10 MEQ tablet Take 10 mEq by mouth daily.   pramipexole (MIRAPEX) 0.25 MG tablet Take 0.25 mg by  mouth 2 (two) times daily.   sacubitril-valsartan (ENTRESTO) 24-26 MG Take 1 tablet by mouth 2 (two) times daily.   spironolactone (ALDACTONE) 25 MG tablet Take 0.5 tablets (12.5 mg total) by mouth daily.   traZODone (DESYREL) 150 MG tablet Take 1 tablet (150 mg total) by mouth at bedtime.   Vitamin D, Ergocalciferol, (DRISDOL) 1.25 MG (50000 UNIT) CAPS capsule Take 1 capsule (50,000 Units total) by mouth every Wednesday.     Allergies:   No known allergies   Social History   Socioeconomic History   Marital status: Widowed    Spouse name: Not on file   Number of children: Not on file   Years of education: Not on file   Highest education level: Not on file  Occupational History   Occupation: Retired  Tobacco Use  Smoking status: Never   Smokeless tobacco: Never  Substance and Sexual Activity   Alcohol use: Yes    Comment: rarely   Drug use: No   Sexual activity: Not on file  Other Topics Concern   Not on file  Social History Narrative   Not on file   Social Determinants of Health   Financial Resource Strain: Low Risk    Difficulty of Paying Living Expenses: Not hard at all  Food Insecurity: No Food Insecurity   Worried About Programme researcher, broadcasting/film/video in the Last Year: Never true   Ran Out of Food in the Last Year: Never true  Transportation Needs: No Transportation Needs   Lack of Transportation (Medical): No   Lack of Transportation (Non-Medical): No  Physical Activity: Not on file  Stress: Not on file  Social Connections: Not on file     Family History: The patient's family history includes Depression in her mother; Hypertension in her mother.  ROS:   Please see the history of present illness.    All other systems reviewed and are negative.  EKGs/Labs/Other Studies Reviewed:    The following studies were reviewed today: I discussed my findings with the patient in extensive length   Recent Labs: 05/05/2020: TSH 2.941 05/08/2020: ALT 25; Hemoglobin 14.1;  Magnesium 2.2; Platelets 189 06/25/2020: B Natriuretic Peptide 46.2 08/11/2020: BUN 16; Creatinine, Ser 0.93; Potassium 5.2; Sodium 140  Recent Lipid Panel    Component Value Date/Time   CHOL 137 05/08/2020 0133   CHOL 177 02/28/2018 1128   TRIG 83 05/08/2020 0133   HDL 40 (L) 05/08/2020 0133   HDL 56 02/28/2018 1128   CHOLHDL 3.4 05/08/2020 0133   VLDL 17 05/08/2020 0133   LDLCALC 80 05/08/2020 0133   LDLCALC 103 (H) 02/28/2018 1128    Physical Exam:    VS:  BP 118/72   Pulse 62   Ht 5\' 8"  (1.727 m)   Wt (!) 304 lb 1.3 oz (137.9 kg)   BMI 46.24 kg/m     Wt Readings from Last 3 Encounters:  11/24/20 (!) 304 lb 1.3 oz (137.9 kg)  11/19/20 (!) 303 lb (137.4 kg)  08/11/20 (!) 309 lb (140.2 kg)     GEN: Patient is in no acute distress HEENT: Normal NECK: No JVD; No carotid bruits LYMPHATICS: No lymphadenopathy CARDIAC: Hear sounds regular, 2/6 systolic murmur at the apex. RESPIRATORY:  Clear to auscultation without rales, wheezing or rhonchi  ABDOMEN: Soft, non-tender, non-distended MUSCULOSKELETAL:  No edema; No deformity  SKIN: Warm and dry NEUROLOGIC:  Alert and oriented x 3 PSYCHIATRIC:  Normal affect   Signed, 08/13/20, MD  11/24/2020 4:00 PM    [facility]

## 2020-11-24 NOTE — Patient Instructions (Signed)
Medication Instructions:  Your physician recommends that you continue on your current medications as directed. Please refer to the Current Medication list given to you today.  *If you need a refill on your cardiac medications before your next appointment, please call your pharmacy*   Lab Work: Your physician recommends that you return for lab work in: the next few days. You need to have labs done when you are fasting.  You can come Monday through Friday 8:30 am to 12:00 pm and 1:15 to 4:30. You do not need to make an appointment as the order has already been placed. The labs you are going to have done are BMET, CBC, TSH, vitamin D, LFT and Lipids.  If you have labs (blood work) drawn today and your tests are completely normal, you will receive your results only by: MyChart Message (if you have MyChart) OR A paper copy in the mail If you have any lab test that is abnormal or we need to change your treatment, we will call you to review the results.   Testing/Procedures:  WHY IS MY DOCTOR PRESCRIBING ZIO? The Zio system is proven and trusted by physicians to detect and diagnose irregular heart rhythms -- and has been prescribed to hundreds of thousands of patients.  The FDA has cleared the Zio system to monitor for many different kinds of irregular heart rhythms. In a study, physicians were able to reach a diagnosis 90% of the time with the Zio system1.  You can wear the Zio monitor -- a small, discreet, comfortable patch -- during your normal day-to-day activity, including while you sleep, shower, and exercise, while it records every single heartbeat for analysis.  1Barrett, P., et al. Comparison of 24 Hour Holter Monitoring Versus 14 Day Novel Adhesive Patch Electrocardiographic Monitoring. American Journal of Medicine, 2014.  ZIO VS. HOLTER MONITORING The Zio monitor can be comfortably worn for up to 14 days. Holter monitors can be worn for 24 to 48 hours, limiting the time to record any  irregular heart rhythms you may have. Zio is able to capture data for the 51% of patients who have their first symptom-triggered arrhythmia after 48 hours.1  LIVE WITHOUT RESTRICTIONS The Zio ambulatory cardiac monitor is a small, unobtrusive, and water-resistant patch--you might even forget you're wearing it. The Zio monitor records and stores every beat of your heart, whether you're sleeping, working out, or showering.  Wear the monitor for 14 days, remove 12/08/20.   Follow-Up: At [facility], you and your health needs are our priority.  As part of our continuing mission to provide you with exceptional heart care, we have created designated Provider Care Teams.  These Care Teams include your primary Cardiologist (physician) and Advanced Practice Providers (APPs -  Physician Assistants and Nurse Practitioners) who all work together to provide you with the care you need, when you need it.  We recommend signing up for the patient portal called "MyChart".  Sign up information is provided on this After Visit Summary.  MyChart is used to connect with patients for Virtual Visits (Telemedicine).  Patients are able to view lab/test results, encounter notes, upcoming appointments, etc.  Non-urgent messages can be sent to your provider as well.   To learn more about what you can do with MyChart, go to [email or web address].    Your next appointment:   6 month(s)  The format for your next appointment:   In Person  Provider:   Belva Crome, MD   Other Instructions NA

## 2020-11-26 ENCOUNTER — Ambulatory Visit: Payer: Medicare Other | Admitting: Neurology

## 2020-11-26 ENCOUNTER — Encounter: Payer: Self-pay | Admitting: Neurology

## 2020-11-26 VITALS — BP 122/72 | HR 72 | Ht 68.0 in | Wt 306.0 lb

## 2020-11-26 DIAGNOSIS — H539 Unspecified visual disturbance: Secondary | ICD-10-CM | POA: Diagnosis not present

## 2020-11-26 DIAGNOSIS — I639 Cerebral infarction, unspecified: Secondary | ICD-10-CM

## 2020-11-26 NOTE — Progress Notes (Signed)
[facility] [address] [place]. [place] [address] [phone]       OFFICE CONSULT NOTE  Paige [name] Date of Birth:  April 21, 1945 Medical Record Number:  [id number]   Referring MD: Melinda Crutch  Reason for Referral: Vision disturbance  HPI: [name] is a 75 year old pleasant Caucasian lady seen today for initial office consultation visit for stroke.  History is obtained from the patient, review of electronic medical records and personally reviewed available pertinent imaging films in PACS.  She has past medical history of hypertension, diabetes, hyperlipidemia, obesity, chronic kidney disease, gastroesophageal reflux disease, hiatal hernia and lymphedema.  Patient states about 3 weeks ago she developed sudden onset of right-sided peripheral vision difficulties which she describes as seeing a large glob in the right field of vision to semitransparent.  It stayed for couple of days and resolved completely in 1 week.  At the beginning she also noticed transient difficulty speaking but denies any facial droop or word finding difficulties.  This resolved shortly within minutes.  She did not seek help right away.  Eventually she called her primary care physician who ordered an MRI scan of the brain which was done on 11/17/2020 which showed a subtle left occipital subcortical linear lesion which showed patchy enhancement after contrast suggestive of a subacute infarct.  MR angiogram of the brain was also obtained which showed no significant large vessel stenosis or occlusion.  2D echo showed diminished ejection fraction of 30 to 35% but no definite clot.  She is currently wearing a 2-week Holter monitor but denies any prior history of palpitations or atrial fibrillation.  She was advised to have lab work done for lipids but that has not yet happened.  She denies any prior history of strokes TIAs seizures or significant neurological problems.  She denies history of syncope, near  syncope, palpitations or cardiac arrhythmias.  She has been started on aspirin which is tolerating well without bleeding or bruising.  She is also on Lipitor 40 mg she is tolerating well without muscle aches or pains.  Her blood pressures well controlled today it is 122/77.  She says her sugars are well controlled but I am unable to find recent hemoglobin A1c in the computer.  ROS:   14 system review of systems is positive for vision disturbance, slurred speech, difficulty walking all other systems negative  PMH:  Past Medical History:  Diagnosis Date   Acute exacerbation of CHF (congestive heart failure) ([place]) 05/04/2020   Acute respiratory failure with hypoxia (HCC) 05/08/2020   Anemia    Anxiety disorder 05/04/2020   Arthritis    Arthritis of knee 09/25/2013   Arthritis of knee, left 09/25/2013   Arthritis of right knee 02/10/2014   Back pain    Chronic kidney disease, stage 3a ([place]) 05/04/2020   Class 3 severe obesity with serious comorbidity and body mass index (BMI) of 45.0 to 49.9 in adult ([place]) 03/29/2018   Depression    Elevated troponin 05/08/2020   Esophageal reflux 05/04/2020   Essential hypertension 05/08/2020   Fatigue    Gallbladder problem    H/O hiatal hernia    High cholesterol 05/04/2020   Hyperlipidemia 05/08/2020   Insulin resistance 03/29/2018   Joint pain    Lymphedema of both lower extremities    Morbid obesity with BMI of 50.0-59.9, adult ([place]) 05/08/2020   Obesity    Osteoarthritis    Perennial allergic rhinitis 01/10/2018   PONV (postoperative nausea and vomiting)  after Cholecysectomy   Primary osteoarthritis of left hip 11/26/2015   Primary osteoarthritis of right knee 02/09/2014   Restless leg syndrome 05/08/2020   Rhinitis medicamentosa [id number]   Systolic and diastolic CHF, acute ([place]) 05/08/2020   Vasomotor rhinitis 01/10/2018   Vertigo    Vitamin B 12 deficiency    Vitamin D deficiency 03/29/2018    Social History:  Social History   Socioeconomic  History   Marital status: Widowed    Spouse name: Not on file   Number of children: Not on file   Years of education: Not on file   Highest education level: Not on file  Occupational History   Occupation: Retired  Tobacco Use   Smoking status: Never   Smokeless tobacco: Never  Substance and Sexual Activity   Alcohol use: Yes    Comment: rarely   Drug use: No   Sexual activity: Not on file  Other Topics Concern   Not on file  Social History Narrative   Not on file   Social Determinants of Health   Financial Resource Strain: Low Risk    Difficulty of Paying Living Expenses: Not hard at all  Food Insecurity: No Food Insecurity   Worried About Charity fundraiser in the Last Year: Never true   [place] in the Last Year: Never true  Transportation Needs: No Transportation Needs   Lack of Transportation (Medical): No   Lack of Transportation (Non-Medical): No  Physical Activity: Not on file  Stress: Not on file  Social Connections: Not on file  Intimate Partner Violence: Not on file    Medications:   Current Outpatient Medications on File Prior to Visit  Medication Sig Dispense Refill   aspirin 81 MG chewable tablet Chew 81 mg by mouth daily.     atorvastatin (LIPITOR) 40 MG tablet Take 1 tablet (40 mg total) by mouth daily. 30 tablet 0   Calcium-Vitamin D 500-125 MG-UNIT TABS Take 1 tablet by mouth daily at 6 (six) AM.     carvedilol (COREG) 3.125 MG tablet Take 3.125 mg by mouth 2 (two) times daily.     cholecalciferol (VITAMIN D3) 25 MCG (1000 UNIT) tablet Take 1,000 Units by mouth daily.     Cyanocobalamin (VITAMIN B 12 PO) Take 1 tablet by mouth daily.     empagliflozin (JARDIANCE) 10 MG TABS tablet Take 1 tablet (10 mg total) by mouth daily before breakfast. 90 tablet 3   furosemide (LASIX) 40 MG tablet Take 1 tablet (40 mg total) by mouth as needed for edema (weight gain of 3 pounds in 24 hours/ 5 pounds in a week). 30 tablet 0   PARoxetine (PAXIL) 20 MG tablet  Take 20 mg by mouth every morning.     Phenylephrine HCl (SINEX REGULAR NA) Place 1 spray into both nostrils daily as needed for congestion (congestion).     potassium chloride (KLOR-CON) 10 MEQ tablet Take 10 mEq by mouth daily.     pramipexole (MIRAPEX) 0.25 MG tablet Take 0.25 mg by mouth 2 (two) times daily.     sacubitril-valsartan (ENTRESTO) 24-26 MG Take 1 tablet by mouth 2 (two) times daily. 180 tablet 0   spironolactone (ALDACTONE) 25 MG tablet Take 0.5 tablets (12.5 mg total) by mouth daily. 45 tablet 0   traZODone (DESYREL) 150 MG tablet Take 1 tablet (150 mg total) by mouth at bedtime. 30 tablet 0   Vitamin D, Ergocalciferol, (DRISDOL) 1.25 MG (50000 UNIT) CAPS capsule Take 1 capsule (  50,000 Units total) by mouth every Wednesday. 5 capsule 0   No current facility-administered medications on file prior to visit.    Allergies:   Allergies  Allergen Reactions   No Known Allergies     Physical Exam General: Obese elderly Caucasian lady, seated, in no evident distress Head: head normocephalic and atraumatic.   Neck: supple with no carotid or supraclavicular bruits Cardiovascular: regular rate and rhythm, no murmurs Musculoskeletal: no deformity Skin:  no rash/petichiae Vascular:  Normal pulses all extremities  Neurologic Exam Mental Status: Awake and fully alert. Oriented to place and time. Recent and remote memory intact. Attention span, concentration and fund of knowledge appropriate. Mood and affect appropriate.  Cranial Nerves: Fundoscopic exam reveals sharp disc margins. Pupils equal, briskly reactive to light. Extraocular movements full without nystagmus. Visual fields full to confrontation. Hearing diminished slightly bilaterally. Facial sensation intact. Face, tongue, palate moves normally and symmetrically.  Motor: Normal bulk and tone. Normal strength in all tested extremity muscles. Sensory.: intact to touch , pinprick , position and vibratory sensation.   Coordination: Rapid alternating movements normal in all extremities. Finger-to-nose and heel-to-shin performed accurately bilaterally. Gait and Station: Arises from chair without difficulty. Stance is normal. Gait demonstrates normal stride length and balance . Able to heel, toe and tandem walk with moderate difficulty.  Reflexes: 1+ and symmetric. Toes downgoing.   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  Vascular risk factors of diabetes, hypertension, hyperlipidemia, cardiomyopathy and obesity     PLAN: I had a long d/w patient about his recent stroke, risk for recurrent stroke/TIAs, personally independently reviewed imaging studies and stroke evaluation results and answered questions.Continue aspirin 81 mg daily  for secondary stroke prevention and maintain strict control of hypertension with blood pressure goal below 130/90, diabetes with hemoglobin A1c goal below 6.5% and lipids with LDL cholesterol goal below 70 mg/dL. I also advised the patient to eat a healthy diet with plenty of whole grains, cereals, fruits and vegetables, exercise regularly and maintain ideal body weight .check carotid ultrasound, lipid profile, hemoglobin A1c.  Refer for loop recorder if 2-week cardiac monitoring is negative for paroxysmal A. fib.  Patient may also consider possible participation in the [place] stroke prevention trial if interested.  Followup in the future with me in 3 months or call earlier if necessary.  Greater than 50% time during this 45-minute consultation visit was spent in counseling and coordination of care about her cryptogenic stroke and vision disturbance and discussing stroke prevention and treatment and answering questions Delia Heady, MD  Note: This document was prepared with digital dictation and possible smart  phrase technology. Any transcriptional errors that result from this process are unintentional.

## 2020-11-26 NOTE — Patient Instructions (Signed)
I had a long d/w patient about his recent stroke, risk for recurrent stroke/TIAs, personally independently reviewed imaging studies and stroke evaluation results and answered questions.Continue aspirin 81 mg daily  for secondary stroke prevention and maintain strict control of hypertension with blood pressure goal below 130/90, diabetes with hemoglobin A1c goal below 6.5% and lipids with LDL cholesterol goal below 70 mg/dL. I also advised the patient to eat a healthy diet with plenty of whole grains, cereals, fruits and vegetables, exercise regularly and maintain ideal body weight .check carotid ultrasound, lipid profile, hemoglobin A1c.  Refer for loop recorder if 2-week cardiac monitoring is negative for paroxysmal A. fib.  Patient may also consider possible participation in the [place] stroke prevention trial if interested.  Followup in the future with me in 3 months or call earlier if necessary. Stroke Prevention Some medical conditions and behaviors are associated with a higher chance of having a stroke. You can help prevent a stroke by making nutrition, lifestyle, and other changes, including managing any medical conditions you may have. What nutrition changes can be made?  Eat healthy foods. You can do this by: Choosing foods high in fiber, such as fresh fruits and vegetables and whole grains. Eating at least 5 or more servings of fruits and vegetables a day. Try to fill half of your plate at each meal with fruits and vegetables. Choosing lean protein foods, such as lean cuts of meat, poultry without skin, fish, tofu, beans, and nuts. Eating low-fat dairy products. Avoiding foods that are high in salt (sodium). This can help lower blood pressure. Avoiding foods that have saturated fat, trans fat, and cholesterol. This can help prevent high cholesterol. Avoiding processed and premade foods. Follow your health care provider's specific guidelines for losing weight, controlling high blood pressure  (hypertension), lowering high cholesterol, and managing diabetes. These may include: Reducing your daily calorie intake. Limiting your daily sodium intake to 1,500 milligrams (mg). Using only healthy fats for cooking, such as olive oil, canola oil, or sunflower oil. Counting your daily carbohydrate intake. What lifestyle changes can be made? Maintain a healthy weight. Talk to your health care provider about your ideal weight. Get at least 30 minutes of moderate physical activity at least 5 days a week. Moderate activity includes brisk walking, biking, and swimming. Do not use any products that contain nicotine or tobacco, such as cigarettes and e-cigarettes. If you need help quitting, ask your health care provider. It may also be helpful to avoid exposure to secondhand smoke. Limit alcohol intake to no more than 1 drink a day for nonpregnant women and 2 drinks a day for men. One drink equals 12 oz of beer, 5 oz of wine, or 1 oz of hard liquor. Stop any illegal drug use. Avoid taking birth control pills. Talk to your health care provider about the risks of taking birth control pills if: You are over 71 years old. You smoke. You get migraines. You have ever had a blood clot. What other changes can be made? Manage your cholesterol levels. Eating a healthy diet is important for preventing high cholesterol. If cholesterol cannot be managed through diet alone, you may also need to take medicines. Take any prescribed medicines to control your cholesterol as told by your health care provider. Manage your diabetes. Eating a healthy diet and exercising regularly are important parts of managing your blood sugar. If your blood sugar cannot be managed through diet and exercise, you may need to take medicines. Take any prescribed medicines  to control your diabetes as told by your health care provider. Control your hypertension. To reduce your risk of stroke, try to keep your blood pressure below  130/80. Eating a healthy diet and exercising regularly are an important part of controlling your blood pressure. If your blood pressure cannot be managed through diet and exercise, you may need to take medicines. Take any prescribed medicines to control hypertension as told by your health care provider. Ask your health care provider if you should monitor your blood pressure at home. Have your blood pressure checked every year, even if your blood pressure is normal. Blood pressure increases with age and some medical conditions. Get evaluated for sleep disorders (sleep apnea). Talk to your health care provider about getting a sleep evaluation if you snore a lot or have excessive sleepiness. Take over-the-counter and prescription medicines only as told by your health care provider. Aspirin or blood thinners (antiplatelets or anticoagulants) may be recommended to reduce your risk of forming blood clots that can lead to stroke. Make sure that any other medical conditions you have, such as atrial fibrillation or atherosclerosis, are managed. What are the warning signs of a stroke? The warning signs of a stroke can be easily remembered as BEFAST. B is for balance. Signs include: Dizziness. Loss of balance or coordination. Sudden trouble walking. E is for eyes. Signs include: A sudden change in vision. Trouble seeing. F is for face. Signs include: Sudden weakness or numbness of the face. The face or eyelid drooping to one side. A is for arms. Signs include: Sudden weakness or numbness of the arm, usually on one side of the body. S is for speech. Signs include: Trouble speaking (aphasia). Trouble understanding. T is for time. These symptoms may represent a serious problem that is an emergency. Do not wait to see if the symptoms will go away. Get medical help right away. Call your local emergency services (911 in the [place] Do not drive yourself to the hospital. Other signs of stroke may include: A  sudden, severe headache with no known cause. Nausea or vomiting. Seizure. Where to find more information For more information, visit: American Stroke Association: www.strokeassociation.org National Stroke Association: www.stroke.org Summary You can prevent a stroke by eating healthy, exercising, not smoking, limiting alcohol intake, and managing any medical conditions you may have. Do not use any products that contain nicotine or tobacco, such as cigarettes and e-cigarettes. If you need help quitting, ask your health care provider. It may also be helpful to avoid exposure to secondhand smoke. Remember BEFAST for warning signs of stroke. Get help right away if you or a loved one has any of these signs. This information is not intended to replace advice given to you by your health care provider. Make sure you discuss any questions you have with your health care provider. Document Revised: 12/23/2016 Document Reviewed: 02/16/2016 Elsevier Patient Education  2021 [employer].

## 2020-11-27 ENCOUNTER — Telehealth: Payer: Self-pay

## 2020-11-27 LAB — LIPID PANEL
Chol/HDL Ratio: 2.3 ratio (ref 0.0–4.4)
Cholesterol, Total: 119 mg/dL (ref 100–199)
HDL: 52 mg/dL (ref >39)
LDL Chol Calc (NIH): 52 mg/dL (ref 0–99)
Triglycerides: 73 mg/dL (ref 0–149)
VLDL Cholesterol Cal: 15 mg/dL (ref 5–40)

## 2020-11-27 LAB — HEMOGLOBIN A1C
Est. average glucose Bld gHb Est-mCnc: 105 mg/dL
Hgb A1c MFr Bld: 5.3 % (ref 4.8–5.6)

## 2020-11-27 NOTE — Telephone Encounter (Signed)
-----   Message from Micki Riley, MD sent at 11/27/2020  9:18 AM EDT ----- Joneen Roach inform the patient that screening test for diabetes as well as cholesterol profile are both quite satisfactory. ----- Message ----- From: Nell Range Lab Results In Sent: 11/27/2020   5:36 AM EDT To: Micki Riley, MD

## 2020-11-27 NOTE — Telephone Encounter (Signed)
Sent mychart message

## 2020-12-03 DIAGNOSIS — H5319 Other subjective visual disturbances: Secondary | ICD-10-CM | POA: Diagnosis not present

## 2020-12-03 DIAGNOSIS — I639 Cerebral infarction, unspecified: Secondary | ICD-10-CM | POA: Diagnosis not present

## 2020-12-09 ENCOUNTER — Telehealth: Payer: Self-pay | Admitting: Neurology

## 2020-12-09 NOTE — Telephone Encounter (Signed)
[employer] medicare no Chestine Spore , sent Lupita Leash a message she will reach out to the patient to schedule.

## 2020-12-10 ENCOUNTER — Telehealth: Payer: Self-pay | Admitting: Cardiology

## 2020-12-10 DIAGNOSIS — I5042 Chronic combined systolic (congestive) and diastolic (congestive) heart failure: Secondary | ICD-10-CM

## 2020-12-10 MED ORDER — ENTRESTO 24-26 MG PO TABS: 1.0000 | ORAL_TABLET | Freq: Two times a day (BID) | ORAL | 0 refills | Status: DC

## 2020-12-10 NOTE — Telephone Encounter (Signed)
*  STAT* If patient is at the pharmacy, call can be transferred to refill team.   1. Which medications need to be refilled? (please list name of each medication and dose if known)sacubitril-valsartan (ENTRESTO) 24-26 MG  2. Which pharmacy/location (including street and city if local pharmacy) is medication to be sent to? [id number] - [place], [place] - [address]  3. Do they need a 30 day or 90 day supply? 90

## 2020-12-10 NOTE — Telephone Encounter (Signed)
Rx sent 

## 2020-12-15 ENCOUNTER — Telehealth: Payer: Self-pay | Admitting: Cardiology

## 2020-12-15 DIAGNOSIS — I10 Essential (primary) hypertension: Secondary | ICD-10-CM | POA: Diagnosis not present

## 2020-12-15 DIAGNOSIS — R002 Palpitations: Secondary | ICD-10-CM | POA: Diagnosis not present

## 2020-12-15 NOTE — Telephone Encounter (Signed)
Joselyn with [employer] is calling to report critical Zio monitor results.

## 2020-12-15 NOTE — Telephone Encounter (Signed)
Spoke to [employer]. The report patient had a 3 second episode of ventricular asystole from high grade av block. This is on page 10 of the report strip 4. Happened on 11/25/20 at 3pm. Will send to Dr. Tomie China.

## 2021-01-01 DIAGNOSIS — E7800 Pure hypercholesterolemia, unspecified: Secondary | ICD-10-CM | POA: Diagnosis not present

## 2021-01-01 DIAGNOSIS — I10 Essential (primary) hypertension: Secondary | ICD-10-CM | POA: Diagnosis not present

## 2021-01-01 DIAGNOSIS — N1831 Chronic kidney disease, stage 3a: Secondary | ICD-10-CM | POA: Diagnosis not present

## 2021-01-01 DIAGNOSIS — F339 Major depressive disorder, recurrent, unspecified: Secondary | ICD-10-CM | POA: Diagnosis not present

## 2021-01-14 DIAGNOSIS — N1831 Chronic kidney disease, stage 3a: Secondary | ICD-10-CM | POA: Diagnosis not present

## 2021-01-14 DIAGNOSIS — E538 Deficiency of other specified B group vitamins: Secondary | ICD-10-CM | POA: Diagnosis not present

## 2021-01-14 DIAGNOSIS — G4700 Insomnia, unspecified: Secondary | ICD-10-CM | POA: Diagnosis not present

## 2021-01-14 DIAGNOSIS — R7301 Impaired fasting glucose: Secondary | ICD-10-CM | POA: Diagnosis not present

## 2021-01-29 ENCOUNTER — Encounter: Payer: Self-pay | Admitting: Cardiology

## 2021-01-29 ENCOUNTER — Ambulatory Visit: Payer: Medicare Other | Admitting: Cardiology

## 2021-01-29 ENCOUNTER — Other Ambulatory Visit: Payer: Self-pay

## 2021-01-29 VITALS — BP 128/82 | HR 89 | Ht 68.0 in | Wt 309.0 lb

## 2021-01-29 DIAGNOSIS — I5022 Chronic systolic (congestive) heart failure: Secondary | ICD-10-CM | POA: Diagnosis not present

## 2021-01-29 MED ORDER — MEXILETINE HCL 250 MG PO CAPS: 250.0000 mg | ORAL_CAPSULE | Freq: Two times a day (BID) | ORAL | 3 refills | Status: DC

## 2021-01-29 NOTE — Progress Notes (Signed)
Electrophysiology Office Note   Date:  01/29/2021   ID:  Paige [name], Klingler 03-Oct-1945, MRN [id number]  PCP:  Lawerance Cruel, MD  Cardiologist:  Revankar Primary Electrophysiologist:  Corban Kistler Meredith Leeds, MD    Chief Complaint: CHF   History of Present Illness: COYLA Paige [name] is a 76 y.o. female who is being seen today for the evaluation of CHF at the request of Lawerance Cruel, MD. Presenting today for electrophysiology evaluation.  She has a history significant for chronic systolic heart failure, CKD stage IIIa, morbid obesity, hypertension, hyperlipidemia.  She had coronary angiography which was reportedly normal.  Today, denies symptoms of palpitations, chest pain, shortness of breath, orthopnea, PND, lower extremity edema, claudication, dizziness, presyncope, syncope, bleeding, or neurologic sequela. The patient is tolerating medications without difficulties.  She currently feels well.  She is unaware of any symptoms.  She unfortunately had a stroke with an MRI showing a subtle left occipital subcortical linear lesion.  Her symptoms were difficulty speaking.  At this point she is well.  She has had resolution of her symptoms.   Past Medical History:  Diagnosis Date   Acute exacerbation of CHF (congestive heart failure) ([place]) 05/04/2020   Acute respiratory failure with hypoxia ([facility]) 05/08/2020   Anemia    Anxiety disorder 05/04/2020   Arthritis    Arthritis of knee 09/25/2013   Arthritis of knee, left 09/25/2013   Arthritis of right knee 02/10/2014   Back pain    Chronic kidney disease, stage 3a ([place]) 05/04/2020   Class 3 severe obesity with serious comorbidity and body mass index (BMI) of 45.0 to 49.9 in adult ([place]) 03/29/2018   Depression    Elevated troponin 05/08/2020   Esophageal reflux 05/04/2020   Essential hypertension 05/08/2020   Fatigue    Gallbladder problem    H/O hiatal hernia    High cholesterol 05/04/2020   Hyperlipidemia 05/08/2020   Insulin resistance  03/29/2018   Joint pain    Lymphedema of both lower extremities    Morbid obesity with BMI of 50.0-59.9, adult ([place]) 05/08/2020   Obesity    Osteoarthritis    Perennial allergic rhinitis 01/10/2018   PONV (postoperative nausea and vomiting)    after Cholecysectomy   Primary osteoarthritis of left hip 11/26/2015   Primary osteoarthritis of right knee 02/09/2014   Restless leg syndrome 05/08/2020   Rhinitis medicamentosa [id number]   Systolic and diastolic CHF, acute ([place]) 05/08/2020   Vasomotor rhinitis 01/10/2018   Vertigo    Vitamin B 12 deficiency    Vitamin D deficiency 03/29/2018   Past Surgical History:  Procedure Laterality Date   ABDOMINAL HYSTERECTOMY     CHOLECYSTECTOMY     COLONOSCOPY     EYE SURGERY Bilateral    cataracts   FRACTURE SURGERY Right    arm age 59   FRACTURE SURGERY Bilateral    both thumbs   LAPAROSCOPIC GASTRIC BANDING     2010   OVARIAN CYST REMOVAL     RIGHT/LEFT HEART CATH AND CORONARY ANGIOGRAPHY N/A 05/07/2020   Procedure: RIGHT/LEFT HEART CATH AND CORONARY ANGIOGRAPHY;  Surgeon: Dixie Dials, MD;  Location: [facility];  Service: Cardiovascular;  Laterality: N/A;   TOTAL HIP ARTHROPLASTY Left 11/30/2015   Procedure: TOTAL HIP ARTHROPLASTY ANTERIOR APPROACH;  Surgeon: Frederik Pear, MD;  Location: [facility];  Service: Orthopedics;  Laterality: Left;   TOTAL KNEE ARTHROPLASTY Left 09/25/2013   Procedure: TOTAL KNEE ARTHROPLASTY;  Surgeon: Kerin Salen, MD;  Location: [facility];  Service: Orthopedics;  Laterality: Left;   TOTAL KNEE ARTHROPLASTY Right 02/10/2014   dr Mayer Camel   TOTAL KNEE ARTHROPLASTY Right 02/10/2014   Procedure: TOTAL KNEE ARTHROPLASTY;  Surgeon: Kerin Salen, MD;  Location: [facility];  Service: Orthopedics;  Laterality: Right;     Current Outpatient Medications  Medication Sig Dispense Refill   aspirin 81 MG chewable tablet Chew 81 mg by mouth daily.     atorvastatin (LIPITOR) 40 MG tablet Take 1 tablet (40 mg total) by mouth daily. 30  tablet 0   Calcium-Vitamin D 500-125 MG-UNIT TABS Take 1 tablet by mouth daily at 6 (six) AM.     carvedilol (COREG) 6.25 MG tablet Take 6.25 mg by mouth 2 (two) times daily.     cholecalciferol (VITAMIN D3) 25 MCG (1000 UNIT) tablet Take 1,000 Units by mouth daily.     Cyanocobalamin (VITAMIN B 12 PO) Take 1 tablet by mouth daily.     empagliflozin (JARDIANCE) 10 MG TABS tablet Take 1 tablet (10 mg total) by mouth daily before breakfast. 90 tablet 3   furosemide (LASIX) 40 MG tablet Take 1 tablet (40 mg total) by mouth as needed for edema (weight gain of 3 pounds in 24 hours/ 5 pounds in a week). 30 tablet 0   mexiletine (MEXITIL) 250 MG capsule Take 1 capsule (250 mg total) by mouth 2 (two) times daily. 60 capsule 3   PARoxetine (PAXIL) 40 MG tablet Take 40 mg by mouth every morning.     Phenylephrine HCl (SINEX REGULAR NA) Place 1 spray into both nostrils daily as needed for congestion (congestion).     potassium chloride (KLOR-CON) 10 MEQ tablet Take 10 mEq by mouth as directed. When taking Lasix     pramipexole (MIRAPEX) 0.25 MG tablet Take 0.25 mg by mouth 2 (two) times daily.     sacubitril-valsartan (ENTRESTO) 24-26 MG Take 1 tablet by mouth 2 (two) times daily. 180 tablet 0   spironolactone (ALDACTONE) 25 MG tablet Take 0.5 tablets (12.5 mg total) by mouth daily. 45 tablet 0   zolpidem (AMBIEN) 5 MG tablet Take 1 tablet by mouth at bedtime.     traZODone (DESYREL) 150 MG tablet Take 1 tablet (150 mg total) by mouth at bedtime. (Patient not taking: Reported on 01/29/2021) 30 tablet 0   Vitamin D, Ergocalciferol, (DRISDOL) 1.25 MG (50000 UNIT) CAPS capsule Take 1 capsule (50,000 Units total) by mouth every Wednesday. (Patient not taking: Reported on 01/29/2021) 5 capsule 0   No current facility-administered medications for this visit.    Allergies:   No known allergies   Social History:  The patient  reports that she has never smoked. She has never used smokeless tobacco. She reports  current alcohol use. She reports that she does not use drugs.   Family History:  The patient's family history includes Depression in her mother; Hypertension in her mother.   ROS:  Please see the history of present illness.   Otherwise, review of systems is positive for none.   All other systems are reviewed and negative.   PHYSICAL EXAM: VS:  BP 128/82    Pulse 89    Ht 5\' 8"  (1.727 m)    Wt (!) 309 lb (140.2 kg)    SpO2 93%    BMI 46.98 kg/m  , BMI Body mass index is 46.98 kg/m. GEN: Well nourished, well developed, in no acute distress  HEENT: normal  Neck: no JVD, carotid bruits, or masses  Cardiac: RRR; no murmurs, rubs, or gallops,no edema  Respiratory:  clear to auscultation bilaterally, normal work of breathing GI: soft, nontender, nondistended, + BS MS: no deformity or atrophy  Skin: warm and dry Neuro:  Strength and sensation are intact Psych: euthymic mood, full affect  EKG:  EKG is ordered today. Personal review of the ekg ordered shows sinus rhythm, PVCs  Recent Labs: 05/05/2020: TSH 2.941 05/08/2020: ALT 25; Hemoglobin 14.1; Magnesium 2.2; Platelets 189 06/25/2020: B Natriuretic Peptide 46.2 08/11/2020: BUN 16; Creatinine, Ser 0.93; Potassium 5.2; Sodium 140    Lipid Panel     Component Value Date/Time   CHOL 119 11/26/2020 1554   TRIG 73 11/26/2020 1554   HDL 52 11/26/2020 1554   CHOLHDL 2.3 11/26/2020 1554   CHOLHDL 3.4 05/08/2020 0133   VLDL 17 05/08/2020 0133   LDLCALC 52 11/26/2020 1554     Wt Readings from Last 3 Encounters:  01/29/21 (!) 309 lb (140.2 kg)  11/26/20 (!) 306 lb (138.8 kg)  11/24/20 (!) 304 lb 1.3 oz (137.9 kg)      Other studies Reviewed: Additional studies/ records that were reviewed today include: TTE 08/28/20  Review of the above records today demonstrates:   1. EF similar to TTE done 05/05/20 moderately reduced . Left ventricular  ejection fraction, by estimation, is 30 to 35%. The left ventricle has  moderately decreased function.  The left ventricle demonstrates global  hypokinesis. The left ventricular  internal cavity size was mildly dilated. Left ventricular diastolic  parameters are consistent with Grade I diastolic dysfunction (impaired  relaxation).   2. Right ventricular systolic function is normal. The right ventricular  size is normal.   3. The mitral valve is normal in structure. No evidence of mitral valve  regurgitation. No evidence of mitral stenosis.   4. The aortic valve is normal in structure. Aortic valve regurgitation is  not visualized. No aortic stenosis is present.   5. The inferior vena cava is normal in size with greater than 50%  respiratory variability, suggesting right atrial pressure of 3 mmHg.   Cardiac monitor 12/16/2020 personally reviewed Conduction abnormalities such as Mobitz 1 Wenckebach blocks are noted.  Overall the monitor is abnormal.  Ventricular couplets and triplets were noted.  ASSESSMENT AND PLAN:  1.  Chronic systolic heart failure due to nonischemic cardiomyopathy: Currently on optimal medical therapy with carvedilol 6.25 mg twice daily, Jardiance 10 mg daily, Entresto 24/26 mg twice daily, Aldactone 25 mg daily.  Her monitor did show an elevated PVC burden at greater than 10%.  It could be that reducing her burden of PVCs might have an increase in her ejection fraction and thus we would be able to avoid ICD therapy.  2.  Hypertension: Currently well controlled  3.  PVCs: 10.2% noted on cardiac monitor.  Hopefully, if we can suppress her PVCs, her ejection fraction may improve.  We Chinmayi Rumer thus start her on mexiletine 250 mg twice daily.  We Ketsia Linebaugh bring her back in 3 months and if PVCs have resolved, we Ariya Bohannon plan for repeat echo.  If her ejection fraction remains low, we Jovonna Nickell plan for ICD.  If it is above 35%, we Jimy Gates plan for Linq monitor implant.  4.  TIA: We Dashanae Longfield continue to monitor for now.  If her ejection fraction improves, we Christie Viscomi plan for Linq monitor.  If it stays  below 35% we Kale Dols plan for ICD implant for monitoring for atrial fibrillation.  Current medicines are reviewed at length  with the patient today.   The patient does not have concerns regarding her medicines.  The following changes were made today:  none  Labs/ tests ordered today include:  Orders Placed This Encounter  Procedures   EKG 12-Lead      Disposition:   FU with Tonja Jezewski 3 months  Signed, Mervyn Pflaum Meredith Leeds, MD  01/29/2021 4:22 PM     [address] [place] [address] [phone] (office) [phone] (fax)

## 2021-01-29 NOTE — Patient Instructions (Addendum)
Medication Instructions:  Your physician has recommended you make the following change in your medication:  START Mexiletine 250 mg twice daily  *If you need a refill on your cardiac medications before your next appointment, please call your pharmacy*   Lab Work: None ordered   Testing/Procedures: None ordered   Follow-Up: At [facility], you and your health needs are our priority.  As part of our continuing mission to provide you with exceptional heart care, we have created designated Provider Care Teams.  These Care Teams include your primary Cardiologist (physician) and Advanced Practice Providers (APPs -  Physician Assistants and Nurse Practitioners) who all work together to provide you with the care you need, when you need it.  We recommend signing up for the patient portal called "MyChart".  Sign up information is provided on this After Visit Summary.  MyChart is used to connect with patients for Virtual Visits (Telemedicine).  Patients are able to view lab/test results, encounter notes, upcoming appointments, etc.  Non-urgent messages can be sent to your provider as well.   To learn more about what you can do with MyChart, go to [email or web address].    Your next appointment:   3 month(s)  The format for your next appointment:   In Person  Provider:   Loman Brooklyn, MD    Thank you for choosing [facility]!!   Dory Horn, RN [phone]   Other Instructions  Mexiletine capsules What is this medication? MEXILETINE (mex IL e teen) is an antiarrhythmic agent. This medicine is used to treat irregular heart rhythm and can slow rapid heartbeats. It can help your heart to return to and maintain a normal rhythm. Because of the side effects caused by this medicine, it is usually used for heartbeat problems that may be life-threatening. This medicine may be used for other purposes; ask your health care provider or pharmacist if you have questions. COMMON BRAND  NAME(S): Mexitil What should I tell my care team before I take this medication? They need to know if you have any of these conditions: liver disease other heart problems previous heart attack an unusual or allergic reaction to mexiletine, other medicines, foods, dyes, or preservatives pregnant or trying to get pregnant breast-feeding How should I use this medication? Take this medicine by mouth with a glass of water. Follow the directions on the prescription label. It is recommended that you take this medicine with food or an antacid. Take your doses at regular intervals. Do not take your medicine more often than directed. Do not stop taking except on the advice of your doctor or health care professional. Talk to your pediatrician regarding the use of this medicine in children. Special care may be needed. Overdosage: If you think you have taken too much of this medicine contact a poison control center or emergency room at once. NOTE: This medicine is only for you. Do not share this medicine with others. What if I miss a dose? If you miss a dose, take it as soon as you can. If it is almost time for your next dose, take only that dose. Do not take double or extra doses. What may interact with this medication? Do not take this medicine with any of the following medications: dofetilide This medicine may also interact with the following medications: caffeine cimetidine medicines for depression, anxiety, or psychotic disturbances medicines to control heart rhythm phenobarbital phenytoin rifampin theophylline This list may not describe all possible interactions. Give your health care provider a list of  all the medicines, herbs, non-prescription drugs, or dietary supplements you use. Also tell them if you smoke, drink alcohol, or use illegal drugs. Some items may interact with your medicine. What should I watch for while using this medication? Your condition will be monitored closely when you  first begin therapy. Often, this drug is first started in a hospital or other monitored health care setting. Once you are on maintenance therapy, visit your doctor or health care provider for regular checks on your progress. Because your condition and use of this medicine carry some risk, it is a good idea to carry an identification card, necklace or bracelet with details of your condition, medications, and doctor or health care provider. You may get drowsy or dizzy. Do not drive, use machinery, or do anything that needs mental alertness until you know how this medicine affects you. Do not stand or sit up quickly, especially if you are an older patient. This reduces the risk of dizzy or fainting spells. Alcohol can make you more dizzy, increase flushing and rapid heartbeats. Avoid alcoholic drinks. This medicine may cause serious skin reactions. They can happen weeks to months after starting the medicine. Contact your health care provider right away if you notice fevers or flu-like symptoms with a rash. The rash may be red or purple and then turn into blisters or peeling of the skin. Or, you might notice a red rash with swelling of the face, lips or lymph nodes in your neck or under your arms. What side effects may I notice from receiving this medication? Side effects that you should report to your doctor or health care professional as soon as possible: allergic reactions like skin rash, itching or hives, swelling of the face, lips, or tongue breathing problems chest pain, continued irregular heartbeats rash, fever, and swollen lymph nodes redness, blistering, peeling or loosening of the skin, including inside the mouth seizures skin rash trembling, shaking unusual bleeding or bruising unusually weak or tired Side effects that usually do not require medical attention (report to your doctor or health care professional if they continue or are bothersome): blurred vision difficulty  walking heartburn nausea, vomiting nervousness numbness, or tingling in the fingers or toes This list may not describe all possible side effects. Call your doctor for medical advice about side effects. You may report side effects to FDA at 1-800-FDA-1088. Where should I keep my medication? Keep out of reach of children. Store at room temperature between 15 and 30 degrees C (59 and 86 degrees F). Throw away any unused medicine after the expiration date. NOTE: This sheet is a summary. It may not cover all possible information. If you have questions about this medicine, talk to your doctor, pharmacist, or health care provider.  2022 Elsevier/Gold Standard (2018-04-19 00:00:00)

## 2021-03-12 ENCOUNTER — Other Ambulatory Visit: Payer: Self-pay | Admitting: Cardiology

## 2021-03-12 DIAGNOSIS — I5042 Chronic combined systolic (congestive) and diastolic (congestive) heart failure: Secondary | ICD-10-CM

## 2021-03-18 DIAGNOSIS — E538 Deficiency of other specified B group vitamins: Secondary | ICD-10-CM | POA: Diagnosis not present

## 2021-03-18 DIAGNOSIS — R7303 Prediabetes: Secondary | ICD-10-CM | POA: Diagnosis not present

## 2021-03-18 DIAGNOSIS — F339 Major depressive disorder, recurrent, unspecified: Secondary | ICD-10-CM | POA: Diagnosis not present

## 2021-03-25 ENCOUNTER — Other Ambulatory Visit: Payer: Self-pay

## 2021-03-25 DIAGNOSIS — I5042 Chronic combined systolic (congestive) and diastolic (congestive) heart failure: Secondary | ICD-10-CM

## 2021-03-25 MED ORDER — SPIRONOLACTONE 25 MG PO TABS: 12.5000 mg | ORAL_TABLET | Freq: Every day | ORAL | 2 refills | Status: DC

## 2021-04-01 ENCOUNTER — Ambulatory Visit: Payer: Medicare Other | Admitting: Neurology

## 2021-04-15 ENCOUNTER — Other Ambulatory Visit: Payer: Self-pay | Admitting: Cardiology

## 2021-05-11 ENCOUNTER — Ambulatory Visit (INDEPENDENT_AMBULATORY_CARE_PROVIDER_SITE_OTHER): Payer: Medicare Other

## 2021-05-11 ENCOUNTER — Encounter: Payer: Self-pay | Admitting: Cardiology

## 2021-05-11 ENCOUNTER — Ambulatory Visit: Payer: Medicare Other | Admitting: Cardiology

## 2021-05-11 VITALS — BP 124/84 | HR 91 | Ht 68.0 in | Wt 316.8 lb

## 2021-05-11 DIAGNOSIS — I493 Ventricular premature depolarization: Secondary | ICD-10-CM

## 2021-05-11 DIAGNOSIS — I5022 Chronic systolic (congestive) heart failure: Secondary | ICD-10-CM | POA: Diagnosis not present

## 2021-05-11 NOTE — Patient Instructions (Addendum)
Medication Instructions:  ?Your physician recommends that you continue on your current medications as directed. Please refer to the Current Medication list given to you today. ? ?*If you need a refill on your cardiac medications before your next appointment, please call your pharmacy* ? ? ?Lab Work: ?None ordered ? ? ?Testing/Procedures: ?                         ? ?ZIO XT- Long Term Monitor Instructions ? ?Your physician has requested you wear a ZIO patch monitor for 14 days.  ?This is a single patch monitor. [employer] supplies one patch monitor per enrollment. Additional ?stickers are not available. Please do not apply patch if you will be having a Nuclear Stress Test,  ?Echocardiogram, Cardiac CT, MRI, or Chest Xray during the period you would be wearing the  ?monitor. The patch cannot be worn during these tests. You cannot remove and re-apply the  ?ZIO XT patch monitor.  ?Your ZIO patch monitor will be mailed 3 day USPS to your address on file. It may take 3-5 days  ?to receive your monitor after you have been enrolled.  ?Once you have received your monitor, please review the enclosed instructions. Your monitor  ?has already been registered assigning a specific monitor serial # to you. ? ?Billing and Patient Assistance Program Information ? ?We have supplied [employer] with any of your insurance information on file for billing purposes. ?[employer] offers a sliding scale Patient Assistance Program for patients that do not have  ?insurance, or whose insurance does not completely cover the cost of the ZIO monitor.  ?You must apply for the Patient Assistance Program to qualify for this discounted rate.  ?To apply, please call [employer] at [phone], select option 4, select option 2, ask to apply for  ?Patient Assistance Program. Theodore Demark will ask your household income, and how many people  ?are in your household. They will quote your out-of-pocket cost based on that information.  ?[employer] will also be able to set up a  27-month interest-free payment plan if needed. ? ?Applying the monitor ?  ?Shave hair from upper left chest.  ?Hold abrader disc by orange tab. Rub abrader in 40 strokes over the upper left chest as  ?indicated in your monitor instructions.  ?Clean area with 4 enclosed alcohol pads. Let dry.  ?Apply patch as indicated in monitor instructions. Patch will be placed under collarbone on left  ?side of chest with arrow pointing upward.  ?Rub patch adhesive wings for 2 minutes. Remove white label marked "1". Remove the white  ?label marked "2". Rub patch adhesive wings for 2 additional minutes.  ?While looking in a mirror, press and release button in center of patch. A small green light will  ?flash 3-4 times. This will be your only indicator that the monitor has been turned on.  ?Do not shower for the first 24 hours. You may shower after the first 24 hours.  ?Press the button if you feel a symptom. You will hear a small click. Record Date, Time and  ?Symptom in the Patient Logbook.  ?When you are ready to remove the patch, follow instructions on the last 2 pages of Patient  ?Logbook. Stick patch monitor onto the last page of Patient Logbook.  ?Place Patient Logbook in the blue and white box. Use locking tab on box and tape box closed  ?securely. The blue and white box has prepaid postage on it. Please place it in the  mailbox as  ?soon as possible. Your physician should have your test results approximately 7 days after the  ?monitor has been mailed back to [facility].  ?Call [facility] at [phone] if you have questions regarding  ?your ZIO XT patch monitor. Call them immediately if you see an orange light blinking on your  ?monitor.  ?If your monitor falls off in less than 4 days, contact our Monitor department at [phone].  ?If your monitor becomes loose or falls off after 4 days call [employer] at [phone] for  ?suggestions on securing your monitor ? ? ?Follow-Up: ?At [facility],  you and your health needs are our priority.  As part of our continuing mission to provide you with exceptional heart care, we have created designated Provider Care Teams.  These Care Teams include your primary Cardiologist (physician) and Advanced Practice Providers (APPs -  Physician Assistants and Nurse Practitioners) who all work together to provide you with the care you need, when you need it. ? ?Your next appointment:   ?To be determined after monitor ? ?The format for your next appointment:   ?In Person ? ?Provider:   ?Loman Brooklyn, MD ? ? ? ?Thank you for choosing [facility]!! ? ? ?Dory Horn, RN ?[phone] ? ?  ?

## 2021-05-11 NOTE — Progress Notes (Unsigned)
Enrolled for [employer] to mail a ZIO XT long term holter monitor to the patients address on file.  

## 2021-05-11 NOTE — Progress Notes (Signed)
? ?Electrophysiology Office Note ? ? ?Date:  05/11/2021  ? ?ID:  NOAH RINIER, DOB Jul 03, 1945, MRN [id number] ? ?PCP:  [name], MD  ?Cardiologist:  Revankar ?Primary Electrophysiologist:  Forrester Blando Jorja Loa, MD   ? ?Chief Complaint: CHF ?  ?History of Present Illness: ?Paige [name] is a 76 y.o. female who is being seen today for the evaluation of CHF at the request of Tenny Craw, Darlen Round, MD. Presenting today for electrophysiology evaluation. ? ?She has a history significant for chronic systolic heart failure, CKD stage IIIa, morbid obesity, hypertension, hyperlipidemia.  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  She is currently on mexiletine. ? ?She had a stroke with an MRI showing a subtle left occipital subacute cortical linear lesion.  Her symptoms were difficulty speaking.  She has had complete resolution of her symptoms. ? ?Today, denies symptoms of palpitations, chest pain, shortness of breath, orthopnea, PND, lower extremity edema, claudication, dizziness, presyncope, syncope, bleeding, or neurologic sequela. The patient is tolerating medications without difficulties.  Overall she feels well.  She is unaware of PVCs.  She does not get short of breath when she exerts herself.  She has had some chest discomfort.  Chest discomfort last for 15 minutes and occurs at rest.  It is happened a few times, most recently a few days ago.  Fortunately she has had a left heart catheterization without coronary artery disease. ? ? ?Past Medical History:  ?Diagnosis Date  ? Acute exacerbation of CHF (congestive heart failure) (HCC) 05/04/2020  ? Acute respiratory failure with hypoxia (HCC) 05/08/2020  ? Anemia   ? Anxiety disorder 05/04/2020  ? Arthritis   ? Arthritis of knee 09/25/2013  ? Arthritis of knee, left 09/25/2013  ? Arthritis of right knee 02/10/2014  ? Back pain   ? Chronic kidney disease, stage 3a (HCC) 05/04/2020  ? Class 3 severe obesity with serious comorbidity and body mass index (BMI) of 45.0 to 49.9  in adult [facility]) 03/29/2018  ? Depression   ? Elevated troponin 05/08/2020  ? Esophageal reflux 05/04/2020  ? Essential hypertension 05/08/2020  ? Fatigue   ? Gallbladder problem   ? H/O hiatal hernia   ? High cholesterol 05/04/2020  ? Hyperlipidemia 05/08/2020  ? Insulin resistance 03/29/2018  ? Joint pain   ? Lymphedema of both lower extremities   ? Morbid obesity with BMI of 50.0-59.9, adult (HCC) 05/08/2020  ? Obesity   ? Osteoarthritis   ? Perennial allergic rhinitis 01/10/2018  ? PONV (postoperative nausea and vomiting)   ? after Cholecysectomy  ? Primary osteoarthritis of left hip 11/26/2015  ? Primary osteoarthritis of right knee 02/09/2014  ? Restless leg syndrome 05/08/2020  ? Rhinitis medicamentosa 01/10/2018  ? Systolic and diastolic CHF, acute (HCC) 05/08/2020  ? Vasomotor rhinitis 01/10/2018  ? Vertigo   ? Vitamin B 12 deficiency   ? Vitamin D deficiency 03/29/2018  ? ?Past Surgical History:  ?Procedure Laterality Date  ? ABDOMINAL HYSTERECTOMY    ? CHOLECYSTECTOMY    ? COLONOSCOPY    ? EYE SURGERY Bilateral   ? cataracts  ? FRACTURE SURGERY Right   ? arm age 46  ? FRACTURE SURGERY Bilateral   ? both thumbs  ? LAPAROSCOPIC GASTRIC BANDING    ? 2010  ? OVARIAN CYST REMOVAL    ? RIGHT/LEFT HEART CATH AND CORONARY ANGIOGRAPHY N/A 05/07/2020  ? Procedure: RIGHT/LEFT HEART CATH AND CORONARY ANGIOGRAPHY;  Surgeon: Orpah Cobb, MD;  Location: [facility] INVASIVE [facility];  Service: Cardiovascular;  Laterality: N/A;  ? TOTAL HIP ARTHROPLASTY Left 11/30/2015  ? Procedure: TOTAL HIP ARTHROPLASTY ANTERIOR APPROACH;  Surgeon: Gean Birchwood, MD;  Location: [facility] OR;  Service: Orthopedics;  Laterality: Left;  ? TOTAL KNEE ARTHROPLASTY Left 09/25/2013  ? Procedure: TOTAL KNEE ARTHROPLASTY;  Surgeon: Nestor Lewandowsky, MD;  Location: [facility] OR;  Service: Orthopedics;  Laterality: Left;  ? TOTAL KNEE ARTHROPLASTY Right 02/10/2014  ? dr Turner Daniels  ? TOTAL KNEE ARTHROPLASTY Right 02/10/2014  ? Procedure: TOTAL KNEE ARTHROPLASTY;  Surgeon: Nestor Lewandowsky, MD;  Location:  [facility] OR;  Service: Orthopedics;  Laterality: Right;  ? ? ? ?Current Outpatient Medications  ?Medication Sig Dispense Refill  ? aspirin 81 MG chewable tablet Chew 81 mg by mouth daily.    ? atorvastatin (LIPITOR) 40 MG tablet Take 1 tablet (40 mg total) by mouth daily. 30 tablet 0  ? carvedilol (COREG) 6.25 MG tablet Take 1 tablet (6.25 mg total) by mouth 2 (two) times daily with a meal. 180 tablet 1  ? Cyanocobalamin (VITAMIN B 12 PO) Take 1 tablet by mouth daily.    ? ENTRESTO 24-26 MG TAKE 1 TABLET BY MOUTH TWICE DAILY 180 tablet 0  ? JARDIANCE 25 MG TABS tablet Take 25 mg by mouth daily.    ? mexiletine (MEXITIL) 250 MG capsule Take 1 capsule (250 mg total) by mouth 2 (two) times daily. 60 capsule 3  ? Phenylephrine HCl (SINEX REGULAR NA) Place 1 spray into both nostrils daily as needed for congestion (congestion).    ? pramipexole (MIRAPEX) 0.25 MG tablet Take 0.25 mg by mouth 2 (two) times daily.    ? spironolactone (ALDACTONE) 25 MG tablet Take 0.5 tablets (12.5 mg total) by mouth daily. 45 tablet 2  ? VITAMIN D PO Take 1 capsule by mouth daily.    ? zolpidem (AMBIEN) 5 MG tablet Take 1 tablet by mouth at bedtime.    ? ?No current facility-administered medications for this visit.  ? ? ?Allergies:   No known allergies  ? ?Social History:  The patient  reports that she has never smoked. She has never used smokeless tobacco. She reports current alcohol use. She reports that she does not use drugs.  ? ?Family History:  The patient's family history includes Depression in her mother; Hypertension in her mother.  ? ?ROS:  Please see the history of present illness.   Otherwise, review of systems is positive for none.   All other systems are reviewed and negative.  ? ?PHYSICAL EXAM: ?VS:  BP 124/84   Pulse 91   Ht 5\' 8"  (1.727 m)   Wt (!) 316 lb 12.8 oz (143.7 kg)   SpO2 95%   BMI 48.17 kg/m?  , BMI Body mass index is 48.17 kg/m?. ?GEN: Well nourished, well developed, in no acute distress  ?HEENT: normal  ?Neck: no  JVD, carotid bruits, or masses ?Cardiac: RRR; no murmurs, rubs, or gallops,no edema  ?Respiratory:  clear to auscultation bilaterally, normal work of breathing ?GI: soft, nontender, nondistended, + BS ?MS: no deformity or atrophy  ?Skin: warm and dry ?Neuro:  Strength and sensation are intact ?Psych: euthymic mood, full affect ? ?EKG:  EKG is ordered today. ?Personal review of the ekg ordered shows sinus rhythm, rate 91 ? ?Recent Labs: ?06/25/2020: B Natriuretic Peptide 46.2 ?08/11/2020: BUN 16; Creatinine, Ser 0.93; Potassium 5.2; Sodium 140  ? ? ?Lipid Panel  ?   ?Component Value Date/Time  ? CHOL 119 11/26/2020 1554  ?  TRIG 73 11/26/2020 1554  ? HDL 52 11/26/2020 1554  ? CHOLHDL 2.3 11/26/2020 1554  ? CHOLHDL 3.4 05/08/2020 0133  ? VLDL 17 05/08/2020 0133  ? LDLCALC 52 11/26/2020 1554  ? ? ? ?Wt Readings from Last 3 Encounters:  ?05/11/21 (!) 316 lb 12.8 oz (143.7 kg)  ?01/29/21 (!) 309 lb (140.2 kg)  ?11/26/20 (!) 306 lb (138.8 kg)  ?  ? ? ?Other studies Reviewed: ?Additional studies/ records that were reviewed today include: TTE 08/28/20  ?Review of the above records today demonstrates:  ? 1. EF similar to TTE done 05/05/20 moderately reduced . Left ventricular  ?ejection fraction, by estimation, is 30 to 35%. The left ventricle has  ?moderately decreased function. The left ventricle demonstrates global  ?hypokinesis. The left ventricular  ?internal cavity size was mildly dilated. Left ventricular diastolic  ?parameters are consistent with Grade I diastolic dysfunction (impaired  ?relaxation).  ? 2. Right ventricular systolic function is normal. The right ventricular  ?size is normal.  ? 3. The mitral valve is normal in structure. No evidence of mitral valve  ?regurgitation. No evidence of mitral stenosis.  ? 4. The aortic valve is normal in structure. Aortic valve regurgitation is  ?not visualized. No aortic stenosis is present.  ? 5. The inferior vena cava is normal in size with greater than 50%  ?respiratory  variability, suggesting right atrial pressure of 3 mmHg.  ? ?Cardiac monitor 12/16/2020 personally reviewed ?Conduction abnormalities such as Mobitz 1 Wenckebach blocks are noted.  Overall the monitor is abnorm

## 2021-05-14 DIAGNOSIS — I493 Ventricular premature depolarization: Secondary | ICD-10-CM | POA: Diagnosis not present

## 2021-05-24 DIAGNOSIS — E538 Deficiency of other specified B group vitamins: Secondary | ICD-10-CM | POA: Diagnosis not present

## 2021-05-27 ENCOUNTER — Other Ambulatory Visit: Payer: Self-pay | Admitting: Cardiology

## 2021-06-01 ENCOUNTER — Other Ambulatory Visit: Payer: Self-pay

## 2021-06-01 DIAGNOSIS — I509 Heart failure, unspecified: Secondary | ICD-10-CM | POA: Insufficient documentation

## 2021-06-01 DIAGNOSIS — F339 Major depressive disorder, recurrent, unspecified: Secondary | ICD-10-CM | POA: Insufficient documentation

## 2021-06-01 DIAGNOSIS — R32 Unspecified urinary incontinence: Secondary | ICD-10-CM

## 2021-06-01 DIAGNOSIS — H9190 Unspecified hearing loss, unspecified ear: Secondary | ICD-10-CM | POA: Insufficient documentation

## 2021-06-01 DIAGNOSIS — D510 Vitamin B12 deficiency anemia due to intrinsic factor deficiency: Secondary | ICD-10-CM

## 2021-06-01 DIAGNOSIS — S20229A Contusion of unspecified back wall of thorax, initial encounter: Secondary | ICD-10-CM | POA: Insufficient documentation

## 2021-06-01 DIAGNOSIS — R251 Tremor, unspecified: Secondary | ICD-10-CM | POA: Insufficient documentation

## 2021-06-01 DIAGNOSIS — Z79899 Other long term (current) drug therapy: Secondary | ICD-10-CM

## 2021-06-01 DIAGNOSIS — R7303 Prediabetes: Secondary | ICD-10-CM

## 2021-06-01 DIAGNOSIS — G4700 Insomnia, unspecified: Secondary | ICD-10-CM | POA: Insufficient documentation

## 2021-06-01 DIAGNOSIS — M791 Myalgia, unspecified site: Secondary | ICD-10-CM

## 2021-06-01 DIAGNOSIS — G479 Sleep disorder, unspecified: Secondary | ICD-10-CM | POA: Insufficient documentation

## 2021-06-01 HISTORY — DX: Vitamin B12 deficiency anemia due to intrinsic factor deficiency: D51.0

## 2021-06-01 HISTORY — DX: Other long term (current) drug therapy: Z79.899

## 2021-06-01 HISTORY — DX: Insomnia, unspecified: G47.00

## 2021-06-01 HISTORY — DX: Contusion of unspecified back wall of thorax, initial encounter: S20.229A

## 2021-06-01 HISTORY — DX: Unspecified hearing loss, unspecified ear: H91.90

## 2021-06-01 HISTORY — DX: Heart failure, unspecified: I50.9

## 2021-06-01 HISTORY — DX: Myalgia, unspecified site: M79.10

## 2021-06-01 HISTORY — DX: Unspecified urinary incontinence: R32

## 2021-06-01 HISTORY — DX: Prediabetes: R73.03

## 2021-06-01 HISTORY — DX: Major depressive disorder, recurrent, unspecified: F33.9

## 2021-06-01 HISTORY — DX: Tremor, unspecified: R25.1

## 2021-06-01 HISTORY — DX: Sleep disorder, unspecified: G47.9

## 2021-06-08 ENCOUNTER — Ambulatory Visit: Payer: Medicare Other | Admitting: Cardiology

## 2021-06-09 ENCOUNTER — Other Ambulatory Visit: Payer: Self-pay | Admitting: Cardiology

## 2021-06-09 DIAGNOSIS — I5042 Chronic combined systolic (congestive) and diastolic (congestive) heart failure: Secondary | ICD-10-CM

## 2021-06-16 DIAGNOSIS — H5319 Other subjective visual disturbances: Secondary | ICD-10-CM | POA: Diagnosis not present

## 2021-06-16 DIAGNOSIS — G43109 Migraine with aura, not intractable, without status migrainosus: Secondary | ICD-10-CM | POA: Diagnosis not present

## 2021-06-16 DIAGNOSIS — H02831 Dermatochalasis of right upper eyelid: Secondary | ICD-10-CM | POA: Diagnosis not present

## 2021-06-16 DIAGNOSIS — I639 Cerebral infarction, unspecified: Secondary | ICD-10-CM | POA: Diagnosis not present

## 2021-06-23 DIAGNOSIS — H26493 Other secondary cataract, bilateral: Secondary | ICD-10-CM | POA: Diagnosis not present

## 2021-07-20 DIAGNOSIS — Z0000 Encounter for general adult medical examination without abnormal findings: Secondary | ICD-10-CM | POA: Diagnosis not present

## 2021-07-20 DIAGNOSIS — D510 Vitamin B12 deficiency anemia due to intrinsic factor deficiency: Secondary | ICD-10-CM | POA: Diagnosis not present

## 2021-07-20 DIAGNOSIS — E538 Deficiency of other specified B group vitamins: Secondary | ICD-10-CM | POA: Diagnosis not present

## 2021-07-20 DIAGNOSIS — E7800 Pure hypercholesterolemia, unspecified: Secondary | ICD-10-CM | POA: Diagnosis not present

## 2021-07-20 DIAGNOSIS — I10 Essential (primary) hypertension: Secondary | ICD-10-CM | POA: Diagnosis not present

## 2021-07-20 DIAGNOSIS — E559 Vitamin D deficiency, unspecified: Secondary | ICD-10-CM | POA: Diagnosis not present

## 2021-07-20 LAB — LAB REPORT - SCANNED
A1c: 5.4
EGFR: 54

## 2021-07-28 DIAGNOSIS — G2581 Restless legs syndrome: Secondary | ICD-10-CM | POA: Diagnosis not present

## 2021-07-28 DIAGNOSIS — F339 Major depressive disorder, recurrent, unspecified: Secondary | ICD-10-CM | POA: Diagnosis not present

## 2021-07-28 DIAGNOSIS — E538 Deficiency of other specified B group vitamins: Secondary | ICD-10-CM | POA: Diagnosis not present

## 2021-07-28 DIAGNOSIS — R7303 Prediabetes: Secondary | ICD-10-CM | POA: Diagnosis not present

## 2021-08-16 DIAGNOSIS — Z1331 Encounter for screening for depression: Secondary | ICD-10-CM | POA: Diagnosis not present

## 2021-08-16 DIAGNOSIS — I10 Essential (primary) hypertension: Secondary | ICD-10-CM | POA: Diagnosis not present

## 2021-08-16 DIAGNOSIS — R7303 Prediabetes: Secondary | ICD-10-CM | POA: Diagnosis not present

## 2021-08-16 DIAGNOSIS — I509 Heart failure, unspecified: Secondary | ICD-10-CM | POA: Diagnosis not present

## 2021-08-19 DIAGNOSIS — E7800 Pure hypercholesterolemia, unspecified: Secondary | ICD-10-CM | POA: Diagnosis not present

## 2021-08-19 DIAGNOSIS — I10 Essential (primary) hypertension: Secondary | ICD-10-CM | POA: Diagnosis not present

## 2021-08-19 DIAGNOSIS — N1831 Chronic kidney disease, stage 3a: Secondary | ICD-10-CM | POA: Diagnosis not present

## 2021-08-19 DIAGNOSIS — F339 Major depressive disorder, recurrent, unspecified: Secondary | ICD-10-CM | POA: Diagnosis not present

## 2021-08-25 ENCOUNTER — Other Ambulatory Visit: Payer: Self-pay | Admitting: Cardiology

## 2021-08-25 DIAGNOSIS — I5042 Chronic combined systolic (congestive) and diastolic (congestive) heart failure: Secondary | ICD-10-CM

## 2021-09-20 DIAGNOSIS — E669 Obesity, unspecified: Secondary | ICD-10-CM | POA: Diagnosis not present

## 2021-09-20 DIAGNOSIS — R7303 Prediabetes: Secondary | ICD-10-CM | POA: Diagnosis not present

## 2021-09-20 DIAGNOSIS — Z9884 Bariatric surgery status: Secondary | ICD-10-CM | POA: Diagnosis not present

## 2021-09-20 DIAGNOSIS — I10 Essential (primary) hypertension: Secondary | ICD-10-CM | POA: Diagnosis not present

## 2021-10-01 DIAGNOSIS — I10 Essential (primary) hypertension: Secondary | ICD-10-CM | POA: Diagnosis not present

## 2021-10-01 DIAGNOSIS — E7800 Pure hypercholesterolemia, unspecified: Secondary | ICD-10-CM | POA: Diagnosis not present

## 2021-10-01 DIAGNOSIS — F339 Major depressive disorder, recurrent, unspecified: Secondary | ICD-10-CM | POA: Diagnosis not present

## 2021-10-01 DIAGNOSIS — N1831 Chronic kidney disease, stage 3a: Secondary | ICD-10-CM | POA: Diagnosis not present

## 2021-10-18 ENCOUNTER — Encounter: Payer: Self-pay | Admitting: Cardiology

## 2021-10-18 ENCOUNTER — Ambulatory Visit: Payer: Medicare Other | Attending: Cardiology | Admitting: Cardiology

## 2021-10-18 ENCOUNTER — Telehealth: Payer: Self-pay

## 2021-10-18 VITALS — BP 121/76 | HR 83 | Ht 67.0 in | Wt 315.0 lb

## 2021-10-18 DIAGNOSIS — Z6841 Body Mass Index (BMI) 40.0 and over, adult: Secondary | ICD-10-CM

## 2021-10-18 DIAGNOSIS — I5020 Unspecified systolic (congestive) heart failure: Secondary | ICD-10-CM

## 2021-10-18 DIAGNOSIS — I110 Hypertensive heart disease with heart failure: Secondary | ICD-10-CM | POA: Diagnosis not present

## 2021-10-18 DIAGNOSIS — E782 Mixed hyperlipidemia: Secondary | ICD-10-CM

## 2021-10-18 DIAGNOSIS — I10 Essential (primary) hypertension: Secondary | ICD-10-CM

## 2021-10-18 NOTE — Telephone Encounter (Signed)
  Patient Consent for Virtual Visit        Paige [name] has provided verbal consent on 10/18/2021 for a virtual visit (video or telephone).   CONSENT FOR VIRTUAL VISIT FOR:  Paige [name]  By participating in this virtual visit I agree to the following:  I hereby voluntarily request, consent and authorize [facility] and its employed or contracted physicians, physician assistants, nurse practitioners or other licensed health care professionals (the Practitioner), to provide me with telemedicine health care services (the "Services") as deemed necessary by the treating Practitioner. I acknowledge and consent to receive the Services by the Practitioner via telemedicine. I understand that the telemedicine visit will involve communicating with the Practitioner through live audiovisual communication technology and the disclosure of certain medical information by electronic transmission. I acknowledge that I have been given the opportunity to request an in-person assessment or other available alternative prior to the telemedicine visit and am voluntarily participating in the telemedicine visit.  I understand that I have the right to withhold or withdraw my consent to the use of telemedicine in the course of my care at any time, without affecting my right to future care or treatment, and that the Practitioner or I may terminate the telemedicine visit at any time. I understand that I have the right to inspect all information obtained and/or recorded in the course of the telemedicine visit and may receive copies of available information for a reasonable fee.  I understand that some of the potential risks of receiving the Services via telemedicine include:  Delay or interruption in medical evaluation due to technological equipment failure or disruption; Information transmitted may not be sufficient (e.g. poor resolution of images) to allow for appropriate medical decision making by the  Practitioner; and/or  In rare instances, security protocols could fail, causing a breach of personal health information.  Furthermore, I acknowledge that it is my responsibility to provide information about my medical history, conditions and care that is complete and accurate to the best of my ability. I acknowledge that Practitioner's advice, recommendations, and/or decision may be based on factors not within their control, such as incomplete or inaccurate data provided by me or distortions of diagnostic images or specimens that may result from electronic transmissions. I understand that the practice of medicine is not an exact science and that Practitioner makes no warranties or guarantees regarding treatment outcomes. I acknowledge that a copy of this consent can be made available to me via my patient portal ([facility]), or I can request a printed copy by calling the office of [facility].    I understand that my insurance will be billed for this visit.   I have read or had this consent read to me. I understand the contents of this consent, which adequately explains the benefits and risks of the Services being provided via telemedicine.  I have been provided ample opportunity to ask questions regarding this consent and the Services and have had my questions answered to my satisfaction. I give my informed consent for the services to be provided through the use of telemedicine in my medical care

## 2021-10-18 NOTE — Progress Notes (Unsigned)
Virtual Visit via Video Note   Because of Paige [name]'s co-morbid illnesses, she is at least at moderate risk for complications without adequate follow up.  This format is felt to be most appropriate for this patient at this time.  All issues noted in this document were discussed and addressed.  A limited physical exam was performed with this format.  Please refer to the patient's chart for her consent to telehealth for [facility].       Date:  10/18/2021   ID:  Paige [name], DOB [birth year], MRN [id number] The patient was identified using 2 identifiers.  Patient Location: Home Provider Location: Home Office   PCP:  Lawerance Cruel, MD   [place] Providers Cardiologist:  None { Click to update primary MD,subspecialty MD or APP then REFRESH:1}    Evaluation Performed:  Follow-Up Visit  Chief Complaint: Cardiomyopathy  History of Present Illness:    Paige [name] is a 76 y.o. female with with past medical history of nonischemic cardiomyopathy, palpitation, mixed dyslipidemia and obesity.  She has history of essential hypertension.  She denies any problems at this time and takes care of activities of daily living.  She is involved in weight loss program now and has lost 5 to 6 pounds in the past month she tells me.  At the time of my evaluation, the patient is alert awake oriented and in no distress.   Past Medical History:  Diagnosis Date   Acute exacerbation of CHF (congestive heart failure) ([place]) 05/04/2020   Acute respiratory failure with hypoxia (HCC) 05/08/2020   Anemia    Anxiety disorder 05/04/2020   Arthritis    Arthritis of knee 09/25/2013   Arthritis of knee, left 09/25/2013   Arthritis of right knee 02/10/2014   Back pain    Chronic kidney disease, stage 3a ([place]) 05/04/2020   Class 3 severe obesity with serious comorbidity and body mass index (BMI) of 45.0 to 49.9 in adult [facility]) 03/29/2018   Congestive heart failure ([place]) 06/01/2021    Depression    Elevated troponin 05/08/2020   Esophageal reflux 05/04/2020   Essential hypertension 05/08/2020   Fatigue    Gallbladder problem    H/O hiatal hernia    Hearing loss 06/01/2021   High cholesterol 05/04/2020   Hyperlipidemia 05/08/2020   Insomnia 06/01/2021   Insulin resistance 03/29/2018   Interscapular region contusion 06/01/2021   Joint pain    Lymphedema of both lower extremities    Morbid obesity with BMI of 50.0-59.9, adult ([place]) 05/08/2020   Muscle pain 06/01/2021   Obesity    Osteoarthritis    Other long term (current) drug therapy 06/01/2021   Palpitations 11/24/2020   Perennial allergic rhinitis 01/10/2018   Pernicious anemia 06/01/2021   PONV (postoperative nausea and vomiting)    after Cholecysectomy   Prediabetes 06/01/2021   Primary osteoarthritis of left hip 11/26/2015   Primary osteoarthritis of right knee 02/09/2014   Recurrent major depression ([place]) 06/01/2021   Restless leg syndrome 05/08/2020   Rhinitis medicamentosa 01/10/2018   Sleep disturbance 08/24/1912   Systolic and diastolic CHF, acute ([place]) 05/08/2020   Tremor 06/01/2021   Urinary incontinence 06/01/2021   Vasomotor rhinitis 01/10/2018   Vertigo    Vitamin B 12 deficiency    Vitamin D deficiency 03/29/2018   Past Surgical History:  Procedure Laterality Date   ABDOMINAL HYSTERECTOMY     CHOLECYSTECTOMY     COLONOSCOPY     EYE SURGERY  Bilateral    cataracts   FRACTURE SURGERY Right    arm age 52   FRACTURE SURGERY Bilateral    both thumbs   LAPAROSCOPIC GASTRIC BANDING     2010   OVARIAN CYST REMOVAL     RIGHT/LEFT HEART CATH AND CORONARY ANGIOGRAPHY N/A 05/07/2020   Procedure: RIGHT/LEFT HEART CATH AND CORONARY ANGIOGRAPHY;  Surgeon: Orpah Cobb, MD;  [facility]: [facility] IN[facility];  Service: Cardiovascular;  Laterality: N/A;   TOTAL HIP ARTHROPLASTY Left 11/30/2015   Procedure: TOTAL HIP ARTHROPLASTY ANTERIOR APPROACH;  Surgeon: Gean Birchwood, MD;  Location: [facility] OR;  Service: Orthopedics;  Laterality: Left;    TOTAL KNEE ARTHROPLASTY Left 09/25/2013   Procedure: TOTAL KNEE ARTHROPLASTY;  Surgeon: Nestor Lewandowsky, MD;  Location: [facility] OR;  Service: Orthopedics;  Laterality: Left;   TOTAL KNEE ARTHROPLASTY Right 02/10/2014   dr Turner Daniels   TOTAL KNEE ARTHROPLASTY Right 02/10/2014   Procedure: TOTAL KNEE ARTHROPLASTY;  Surgeon: Nestor Lewandowsky, MD;  Location: [facility] OR;  Service: Orthopedics;  Laterality: Right;     Current Meds  Medication Sig   aspirin 81 MG chewable tablet Chew 81 mg by mouth daily.   atorvastatin (LIPITOR) 40 MG tablet Take 1 tablet (40 mg total) by mouth daily.   buPROPion (WELLBUTRIN XL) 150 MG 24 hr tablet Take 150 mg by mouth every morning.   Calcium Carb-Cholecalciferol (CALCIUM 600+D3 PO) Take 1 tablet by mouth daily.   carvedilol (COREG) 6.25 MG tablet Take 1 tablet (6.25 mg total) by mouth 2 (two) times daily with a meal.   Cyanocobalamin (VITAMIN B 12 PO) Take 1 tablet by mouth daily.   DULoxetine (CYMBALTA) 60 MG capsule Take 60 mg by mouth daily.   JARDIANCE 25 MG TABS tablet Take 25 mg by mouth daily.   mexiletine (MEXITIL) 250 MG capsule TAKE 1 CAPSULE(250 MG) BY MOUTH TWICE DAILY   Phenylephrine HCl (SINEX REGULAR NA) Place 1 spray into both nostrils daily as needed for congestion (congestion).   pramipexole (MIRAPEX) 0.25 MG tablet Take 0.25 mg by mouth 2 (two) times daily.   sacubitril-valsartan (ENTRESTO) 24-26 MG Take 1 tablet by mouth 2 (two) times daily.   spironolactone (ALDACTONE) 25 MG tablet Take 0.5 tablets (12.5 mg total) by mouth daily.   zolpidem (AMBIEN) 10 MG tablet Take 10 mg by mouth at bedtime as needed for sleep.     Allergies:   No known allergies   Social History   Tobacco Use   Smoking status: Never   Smokeless tobacco: Never  Substance Use Topics   Alcohol use: Yes    Comment: rarely   Drug use: No     Family Hx: The patient's family history includes Depression in her mother; Hypertension in her mother.  ROS:   Please see the history of  present illness.    I discussed my findings with the patient at length.  Patient denies any chest pain orthopnea or PND. All other systems reviewed and are negative.   Prior CV studies:   The following studies were reviewed today:  Previous evaluations were discussed with the patient at length.  Labs/Other Tests and Data Reviewed:    EKG:   EKG done in the past reveals sinus rhythm and nonspecific ST-T changes  Recent Labs: No results found for requested labs within last 365 days.   Recent Lipid Panel Lab Results  Component Value Date/Time   CHOL 119 11/26/2020 03:54 PM   TRIG 73 11/26/2020 03:54 PM  HDL 52 11/26/2020 03:54 PM   CHOLHDL 2.3 11/26/2020 03:54 PM   CHOLHDL 3.4 05/08/2020 01:33 AM   LDLCALC 52 11/26/2020 03:54 PM    Wt Readings from Last 3 Encounters:  10/18/21 (!) 315 lb (142.9 kg)  05/11/21 (!) 316 lb 12.8 oz (143.7 kg)  01/29/21 (!) 309 lb (140.2 kg)     Risk Assessment/Calculations:          Objective:    Vital Signs:  BP 121/76   Pulse 83   Ht 5\' 7"  (1.702 m)   Wt (!) 315 lb (142.9 kg)   SpO2 94%   BMI 49.34 kg/m    VITAL SIGNS:  reviewed  ASSESSMENT & PLAN:    Nonischemic cardiomyopathy: I reviewed electrophysiology notes.  Medical management has been recommended.  Patient is asymptomatic at this time.  We will do an echocardiogram for follow-up ejection fraction and left atrial systolic function.  Patient is not guideline directed medical therapy.  Essential hypertension: Blood pressure stable and diet was emphasized.  Lifestyle modification urged.  Morbid obesity: Weight reduction stressed risks of obesity explained and she promises to do better.  Because of multiple medications she will come back in the next few days for complete blood work.  Patient will be seen in follow-up appointment in 6 months or earlier if the patient has any concerns       {Are you ordering a CV Procedure (e.g. stress test, cath, DCCV, TEE, etc)?   Press  F2        :     Time:   Today, I have spent 25 minutes with the patient with telehealth technology discussing the above problems.     Medication Adjustments/Labs and Tests Ordered: Current medicines are reviewed at length with the patient today.  Concerns regarding medicines are outlined above.   Tests Ordered: No orders of the defined types were placed in this encounter.   Medication Changes: No orders of the defined types were placed in this encounter.   Follow Up:  {F/U Format:[phone]} {follow up:15908}  Signed, [id number]}, MD  10/18/2021 11:49 AM    [facility]

## 2021-10-18 NOTE — Patient Instructions (Signed)
Medication Instructions:  Your physician recommends that you continue on your current medications as directed. Please refer to the Current Medication list given to you today.  *If you need a refill on your cardiac medications before your next appointment, please call your pharmacy*   Lab Work: Your physician recommends that you return for lab work in: the next few days. You need to have labs done when you are fasting.  You do not need to make an appointment as the order has already been placed. The labs you are going to have done are BMET, CBC, TSH, LFT and Lipids.  [address] in [place]. They also close daily for lunch for 12-1.   [address] 2nd floor M-W 8-11:30 and 1-4:30 and Thursday and Friday 8-11:30.  If you have labs (blood work) drawn today and your tests are completely normal, you will receive your results only by: [place] (if you have MyChart) OR A paper copy in the mail If you have any lab test that is abnormal or we need to change your treatment, we will call you to review the results.   Testing/Procedures: None ordered   Follow-Up: At [facility], you and your health needs are our priority.  As part of our continuing mission to provide you with exceptional heart care, we have created designated Provider Care Teams.  These Care Teams include your primary Cardiologist (physician) and Advanced Practice Providers (APPs -  Physician Assistants and Nurse Practitioners) who all work together to provide you with the care you need, when you need it.  We recommend signing up for the patient portal called "MyChart".  Sign up information is provided on this After Visit Summary.  MyChart is used to connect with patients for Virtual Visits (Telemedicine).  Patients are able to view lab/test results, encounter notes, upcoming appointments, etc.  Non-urgent messages can be sent to your provider as well.   To learn more about what you  can do with MyChart, go to [email or web address].    Your next appointment:   6 month(s)  The format for your next appointment:   In Person  Provider:   Jyl Heinz, MD   Other Instructions NA

## 2021-12-01 DIAGNOSIS — H52223 Regular astigmatism, bilateral: Secondary | ICD-10-CM | POA: Diagnosis not present

## 2021-12-01 DIAGNOSIS — H524 Presbyopia: Secondary | ICD-10-CM | POA: Diagnosis not present

## 2021-12-01 DIAGNOSIS — H5201 Hypermetropia, right eye: Secondary | ICD-10-CM | POA: Diagnosis not present

## 2021-12-05 IMAGING — MR MR MRA HEAD W/O CM
2 series · 17 of 48 positions shown · IV contrast (gadavist)
Comparison: Prior head CT from 08/08/2007.

CLINICAL DATA: Initial evaluation for visual disturbance.

EXAM:
MRI HEAD WITHOUT AND WITH CONTRAST
MRA HEAD WITHOUT CONTRAST
TECHNIQUE: Multiplanar, multi-echo pulse sequences of the brain and surrounding
structures were acquired without and with intravenous contrast.
Angiographic images of the Circle of Willis were acquired using MRA
technique without intravenous contrast.
CONTRAST:  10mL GADAVIST GADOBUTROL 1 MMOL/ML IV SOLN

[Series 7: TOF · axial · 0.6mm · 0.35mm/px · z∈[-60,+34]mm · 16 of 188 slices shown (1 of 2)]
[im 1/188]
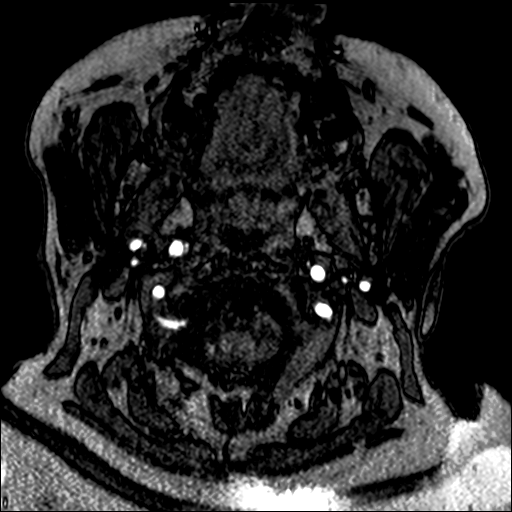
[im 5/188]
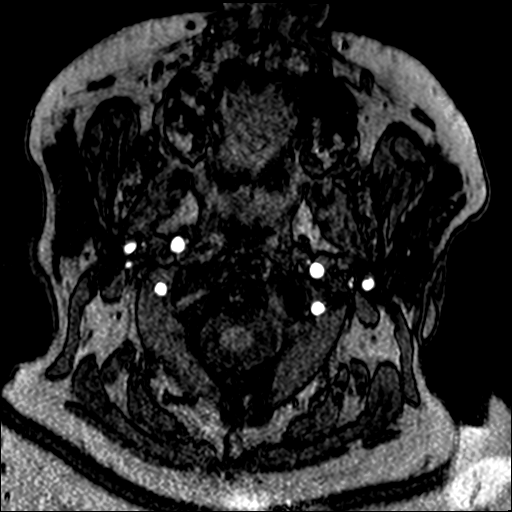
[im 9/188]
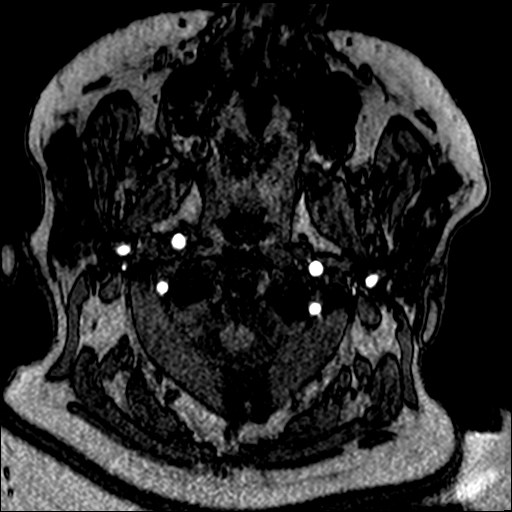
[im 13/188]
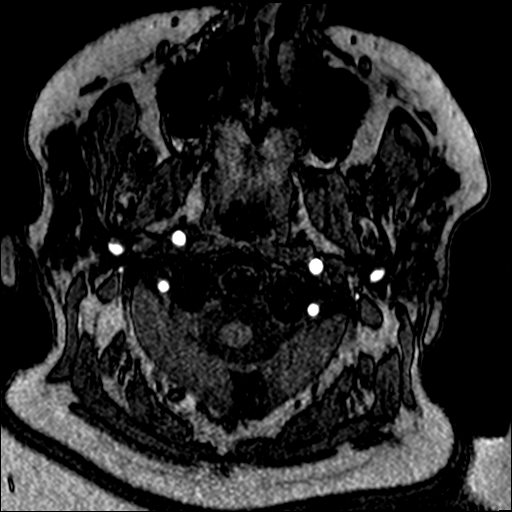
[im 17/188]
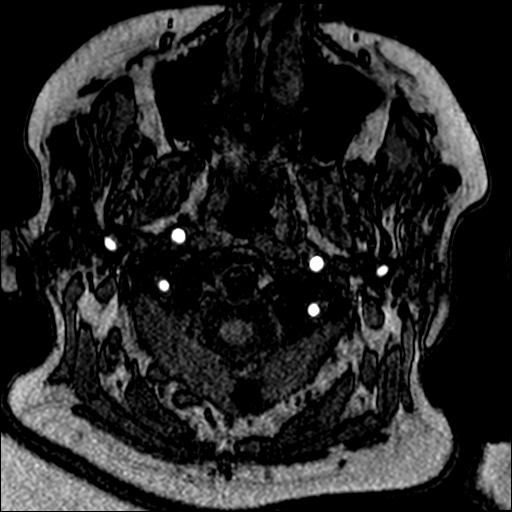
[im 21/188]
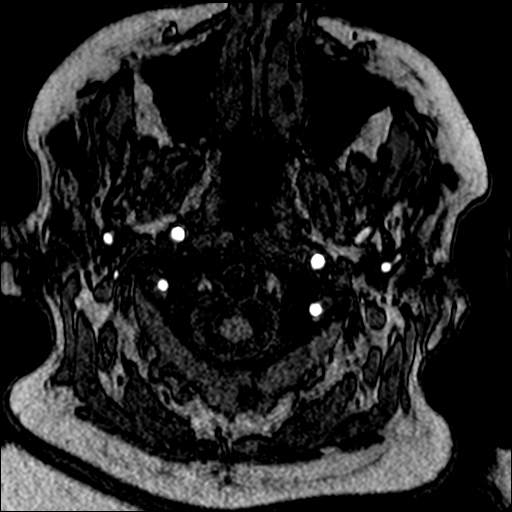
[im 29/188]
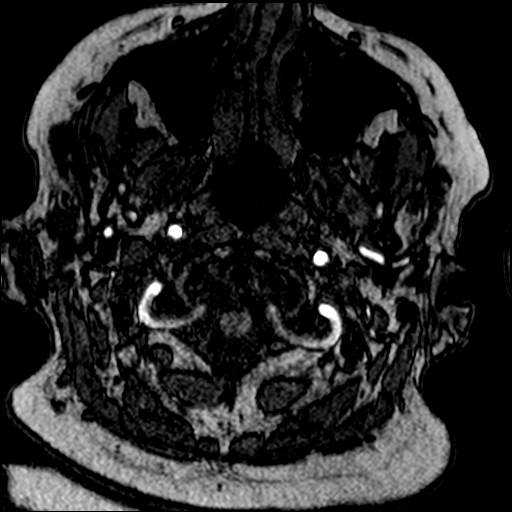
[im 33/188]
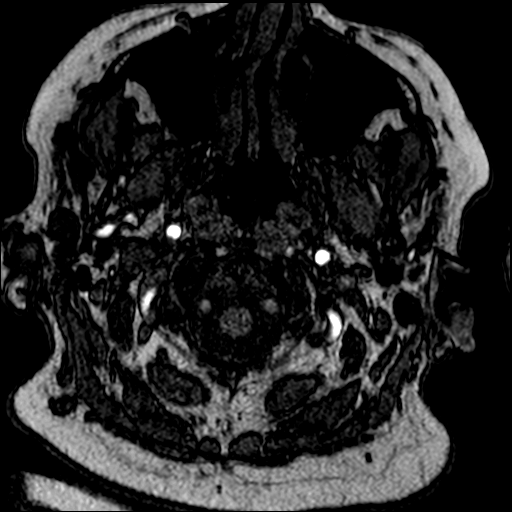
[im 57/188]
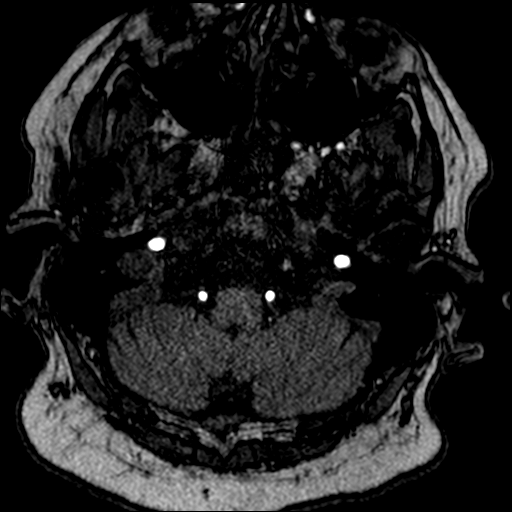
[im 82/188]
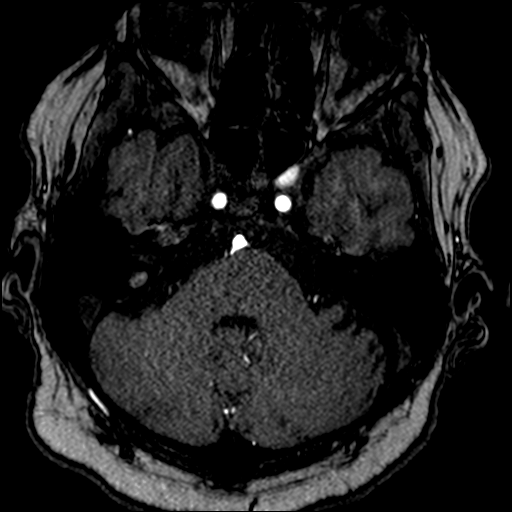
[im 94/188]
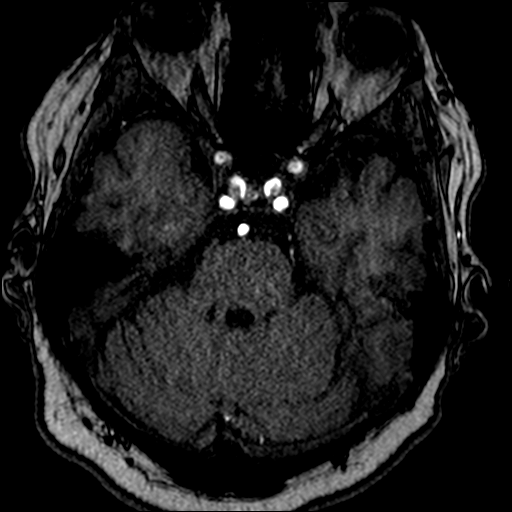
[im 106/188]
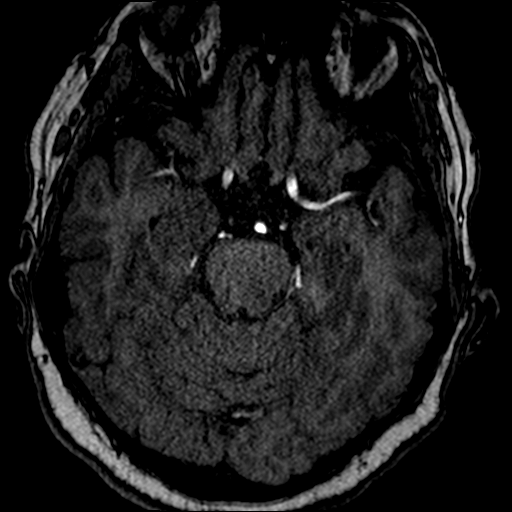
[im 131/188]
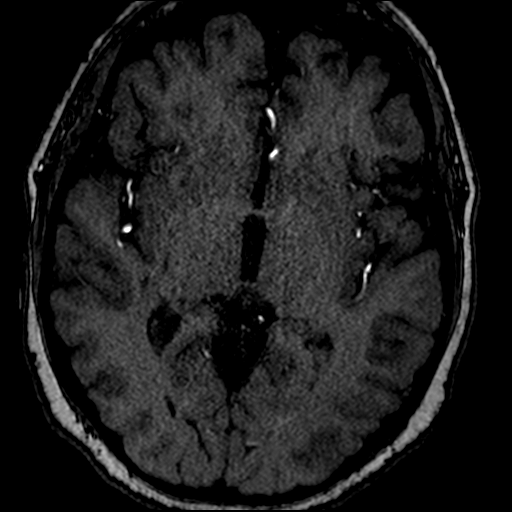
[im 155/188]
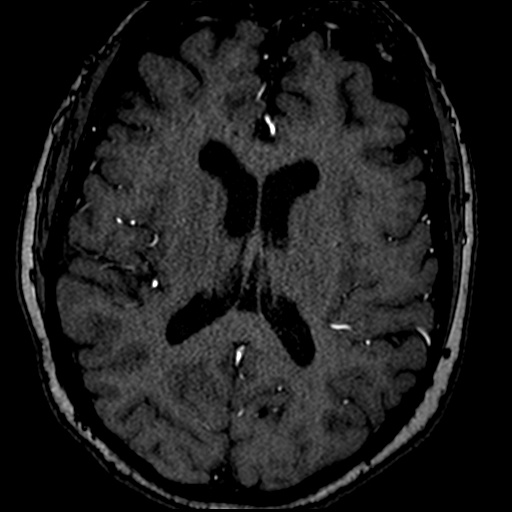
[im 159/188]
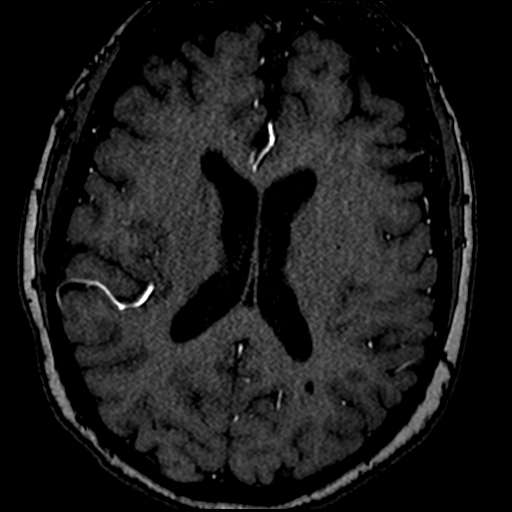
[im 179/188]
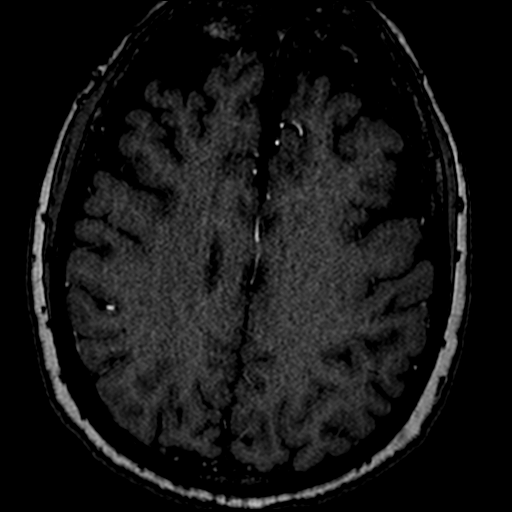

[Series 10: TOF · axial · 112.8mm · 0.35mm/px · 1 of 1 slices shown (2 of 2)]
[im 1/1]
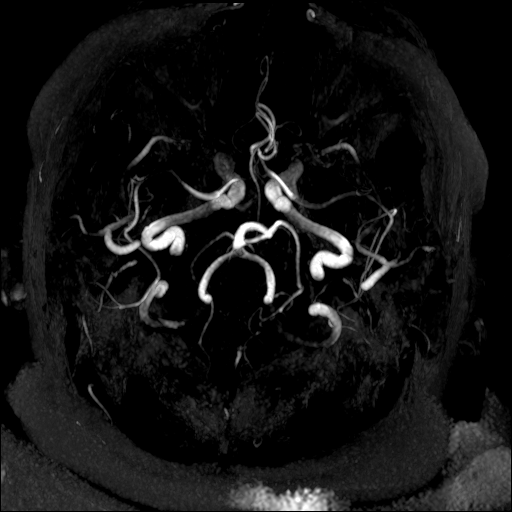

[17 of 48 positions shown; findings below may reference images not displayed]

FINDINGS: MRI HEAD FINDINGS

Brain: Diffuse prominence of the CSF containing spaces consistent
with generalized cerebral atrophy, mild-to-moderate in nature.
Subcentimeter focus of FLAIR hyperintensity involving the deep white
matter of the posterior left frontoparietal region likely reflects a
small chronic lacunar infarct (series 11, image 37).

There is a subtle area of patchy FLAIR signal abnormality seen
involving the subcortical left occipital lobe (series 11, image 23).
Associated patchy post-contrast enhancement seen within this region
(series 16, images 74, 71, 66). No visible associated diffusion
abnormality or significant edema. No associated hemorrhage. Finding
is nonspecific, but could reflect evolving changes of subacute
ischemia.

No other areas of diffusion abnormality to suggest acute or subacute
ischemia. Gray-white matter differentiation otherwise maintained. No
other areas of encephalomalacia to suggest chronic cortical
infarction. No other evidence for acute or chronic intracranial
hemorrhage.

No mass lesion, midline shift or mass effect. No hydrocephalus or
extra-axial fluid collection. Pituitary gland and suprasellar region
within normal limits. Midline structures intact.

Vascular: Major intracranial vascular flow voids are maintained.

Skull and upper cervical spine: Craniocervical junction within
normal limits. Bone marrow signal intensity normal. Hyperostosis
frontalis interna noted. No scalp soft tissue abnormality.

Sinuses/Orbits: Patient status post bilateral ocular lens
replacement. Globes and orbital soft tissues demonstrate no acute
finding. Mild scattered mucosal thickening noted within the
sphenoethmoidal sinuses. Paranasal sinuses are otherwise clear. No
mastoid effusion.

Other: 1.2 cm well-circumscribed T2 hyperintense lesion noted within
the superior aspect of the right parotid gland (series 8, image 4),
indeterminate.

MRA HEAD FINDINGS

Anterior circulation: Visualized distal cervical segments of the
internal carotid arteries are widely patent with symmetric antegrade
flow. Petrous, cavernous, and supraclinoid segments widely patent
without stenosis or other abnormality. A1 segments widely patent.
Normal anterior communicating artery complex. Anterior cerebral
arteries patent to their distal aspects without stenosis. No M1
stenosis or occlusion. Normal MCA bifurcations. Distal MCA branches
well perfused and symmetric.

Posterior circulation: Vertebral arteries are largely code dominant
and widely patent to the vertebrobasilar junction without stenosis.
Left PICA origin patent and normal. Right PICA origin not well seen.
Basilar widely patent to its distal aspect without stenosis.
Superior cerebellar arteries are patent bilaterally. Both PCAs
primarily supplied via the basilar well perfused to their distal
aspects.

Anatomic variants: None significant. No aneurysm or other vascular
abnormality. A with surrounded on this is [REDACTED] sac on again
he does wanted a few of 1 follow-up CT few had a good
IMPRESSION: MRI HEAD IMPRESSION:

1. Subtle FLAIR signal abnormality involving the subcortical left
occipital lobe with associated patchy post-contrast enhancement.
Finding is nonspecific, but favored to reflect evolving changes of
subacute ischemia, left PCA distribution. No associated hemorrhage
or significant mass effect. Short-term interval follow-up
examination in 3 months is recommended to ensure resolution.
2. Underlying age advanced cerebral atrophy.
3. 1.2 cm T2 hyperintense lesion involving the superior aspect of
the right parotid gland, indeterminate. Nonemergent ENT referral and
consultation suggested for further workup and evaluation.

MRA HEAD IMPRESSION:

Normal intracranial MRA. No large vessel occlusion, hemodynamically
significant stenosis, or other acute vascular abnormality.

## 2021-12-05 IMAGING — MR MR HEAD WO/W CM
13 series · 48 of 48 positions shown · IV contrast (10 ML GADAVIST)
Comparison: Prior head CT from 08/08/2007.

CLINICAL DATA: Initial evaluation for visual disturbance.

EXAM:
MRI HEAD WITHOUT AND WITH CONTRAST
MRA HEAD WITHOUT CONTRAST
TECHNIQUE: Multiplanar, multi-echo pulse sequences of the brain and surrounding
structures were acquired without and with intravenous contrast.
Angiographic images of the Circle of Willis were acquired using MRA
technique without intravenous contrast.
CONTRAST:  10mL GADAVIST GADOBUTROL 1 MMOL/ML IV SOLN

[Series 5: DWI · axial · 3.0mm · 1.36mm/px · z∈[-59,+108]mm · 5 of 120 slices shown (1 of 2)]
[im 1/120]
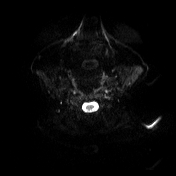
[im 30/120]
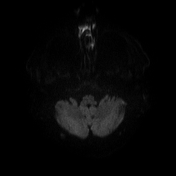
[im 60/120]
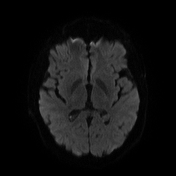
[im 90/120]
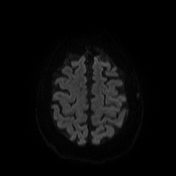
[im 120/120]
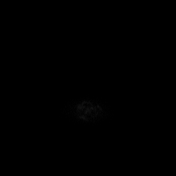

[Series 6: DWI · axial · 3.0mm · 1.36mm/px · z∈[-59,+108]mm · 3 of 60 slices shown (2 of 2)]
[im 1/60]
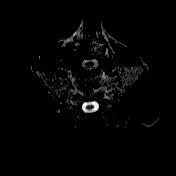
[im 30/60]
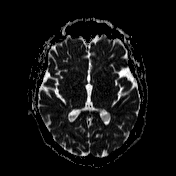
[im 60/60]
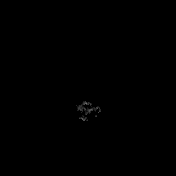

[Series 7: T1 · sagittal · 5.0mm · 0.75mm/px · 2 of 29 slices shown (1 of 2)]
[im 1/29]
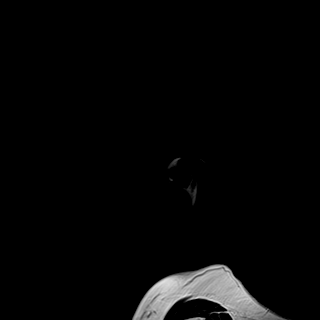
[im 29/29]
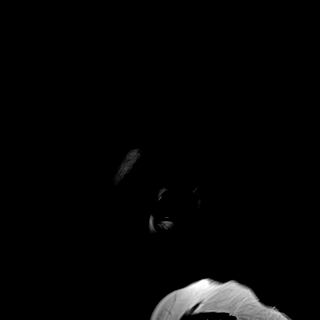

[Series 8: T2 · axial · 5.0mm · 0.62mm/px · z∈[-49,+118]mm · 2 of 29 slices shown]
[im 1/29]
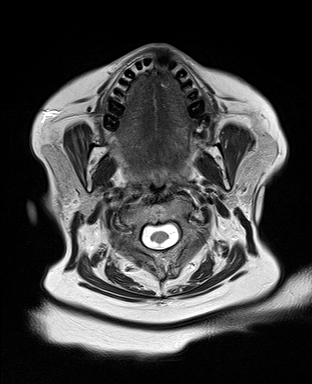
[im 29/29]
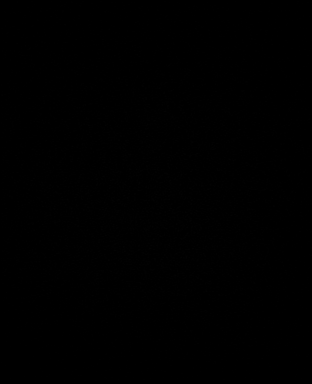

[Series 9: swi_images · axial · 3.0mm · 0.75mm/px · z∈[-53,+122]mm · 3 of 64 slices shown]
[im 1/64]
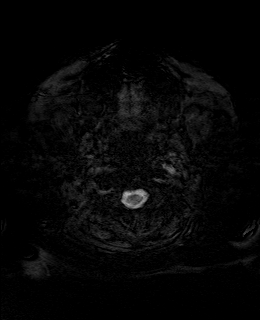
[im 32/64]
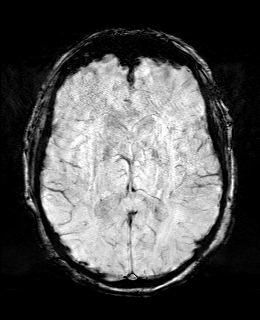
[im 64/64]
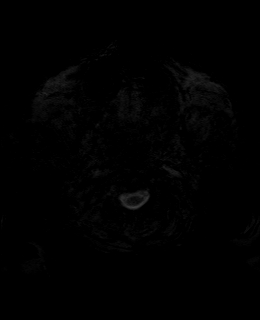

[Series 11: FLAIR · axial · 3.0mm · 0.75mm/px · z∈[-43,+112]mm · 3 of 57 slices shown]
[im 1/57]
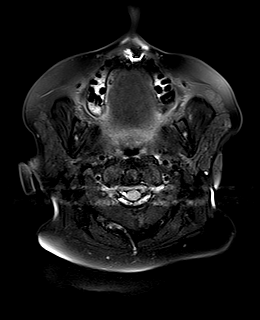
[im 29/57]
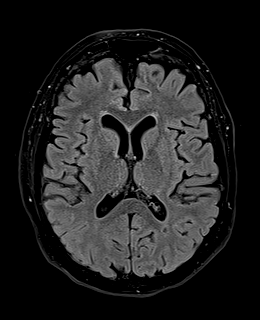
[im 57/57]
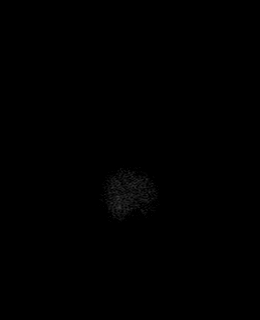

[Series 12: T1 · axial · 1.0mm · 0.94mm/px · z∈[-45,+116]mm · 9 of 176 slices shown (2 of 2)]
[im 1/176]
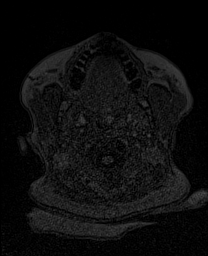
[im 22/176]
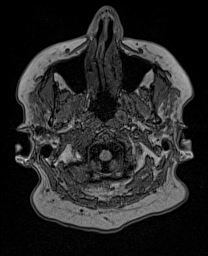
[im 44/176]
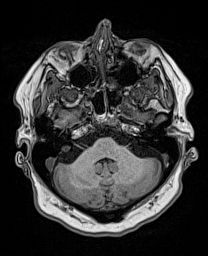
[im 66/176]
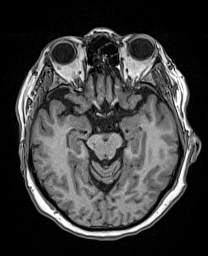
[im 88/176]
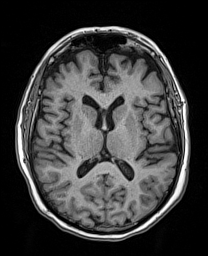
[im 110/176]
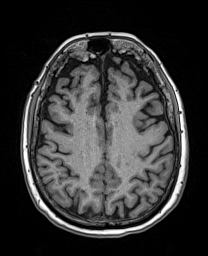
[im 132/176]
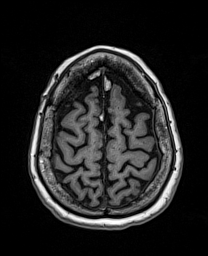
[im 154/176]
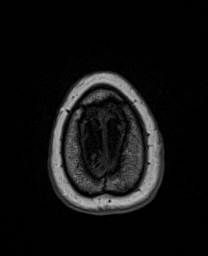
[im 176/176]
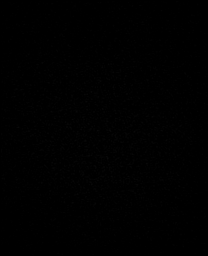

[Series 13: cor dwi_tracew · coronal · 5.0mm · 1.53mm/px · 4 of 66 slices shown]
[im 1/66]
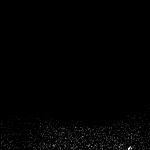
[im 22/66]
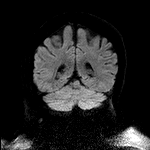
[im 44/66]
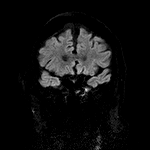
[im 66/66]
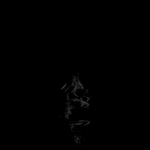

[Series 14: cor dwi_adc · coronal · 5.0mm · 1.53mm/px · 2 of 31 slices shown]
[im 1/31]
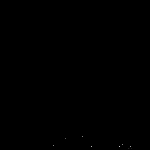
[im 31/31]
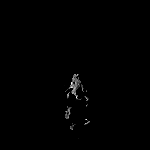

[Series 15: T2 post-contrast · coronal · 5.0mm · 0.57mm/px · 2 of 32 slices shown]
[im 1/32]
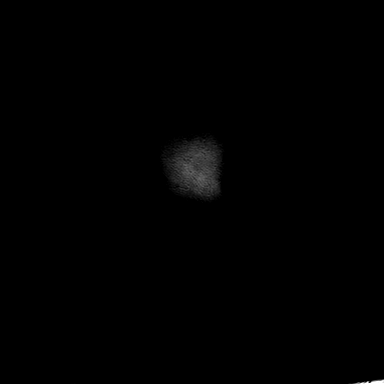
[im 32/32]
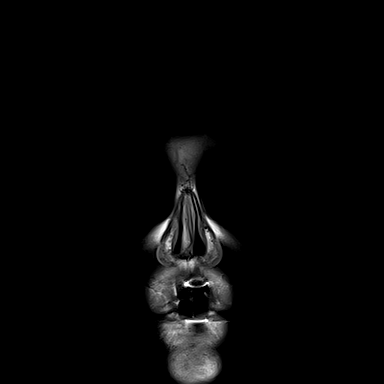

[Series 16: T1 post-contrast · axial · 1.0mm · 0.94mm/px · z∈[-45,+116]mm · 9 of 176 slices shown (1 of 3)]
[im 1/176]
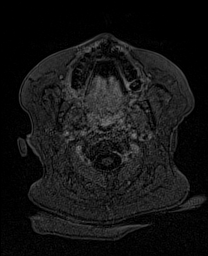
[im 22/176]
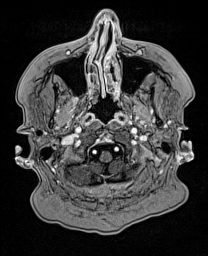
[im 44/176]
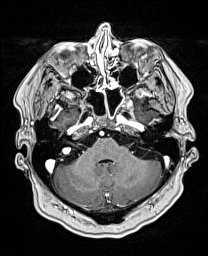
[im 66/176]
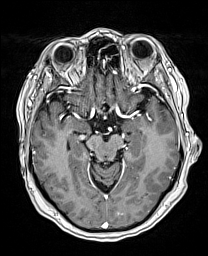
[im 88/176]
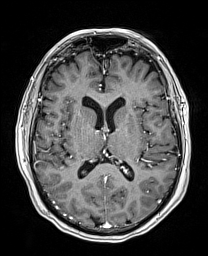
[im 110/176]
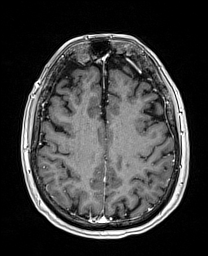
[im 132/176]
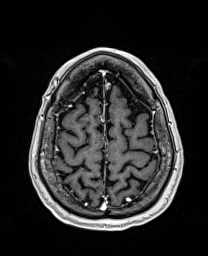
[im 154/176]
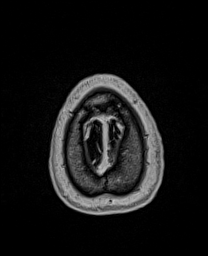
[im 176/176]
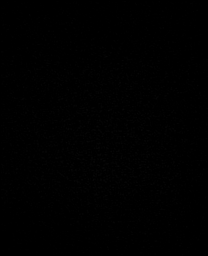

[Series 17: T1 post-contrast · coronal · 5.0mm · 0.43mm/px · 2 of 32 slices shown (2 of 3)]
[im 1/32]
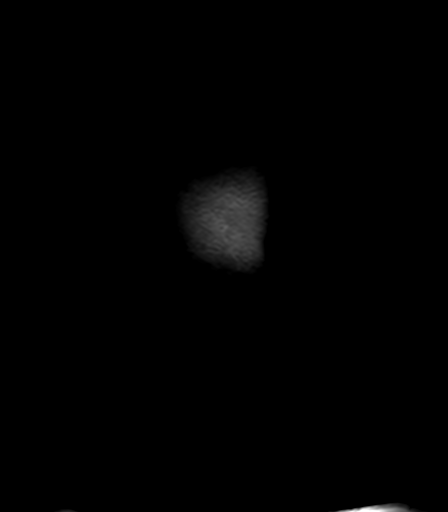
[im 32/32]
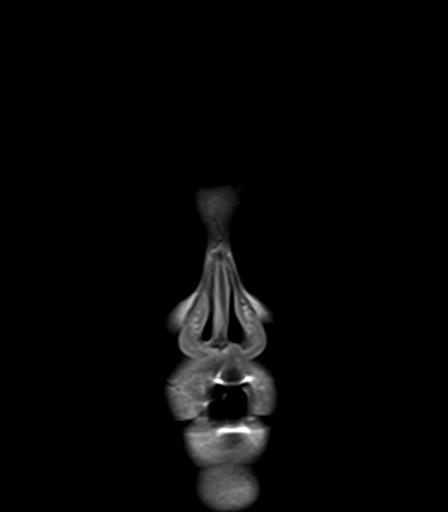

[Series 18: T1 post-contrast · sagittal · 5.0mm · 0.75mm/px · 2 of 29 slices shown (3 of 3)]
[im 1/29]
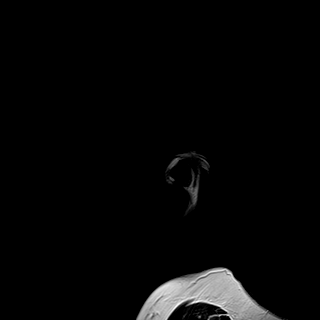
[im 29/29]
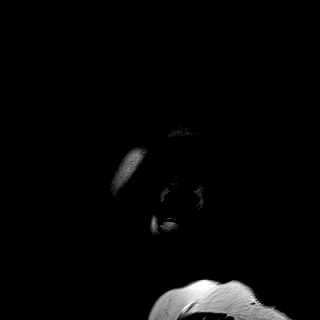

[48 of 48 positions shown; findings below may reference images not displayed]

FINDINGS: MRI HEAD FINDINGS

Brain: Diffuse prominence of the CSF containing spaces consistent
with generalized cerebral atrophy, mild-to-moderate in nature.
Subcentimeter focus of FLAIR hyperintensity involving the deep white
matter of the posterior left frontoparietal region likely reflects a
small chronic lacunar infarct (series 11, image 37).

There is a subtle area of patchy FLAIR signal abnormality seen
involving the subcortical left occipital lobe (series 11, image 23).
Associated patchy post-contrast enhancement seen within this region
(series 16, images 74, 71, 66). No visible associated diffusion
abnormality or significant edema. No associated hemorrhage. Finding
is nonspecific, but could reflect evolving changes of subacute
ischemia.

No other areas of diffusion abnormality to suggest acute or subacute
ischemia. Gray-white matter differentiation otherwise maintained. No
other areas of encephalomalacia to suggest chronic cortical
infarction. No other evidence for acute or chronic intracranial
hemorrhage.

No mass lesion, midline shift or mass effect. No hydrocephalus or
extra-axial fluid collection. Pituitary gland and suprasellar region
within normal limits. Midline structures intact.

Vascular: Major intracranial vascular flow voids are maintained.

Skull and upper cervical spine: Craniocervical junction within
normal limits. Bone marrow signal intensity normal. Hyperostosis
frontalis interna noted. No scalp soft tissue abnormality.

Sinuses/Orbits: Patient status post bilateral ocular lens
replacement. Globes and orbital soft tissues demonstrate no acute
finding. Mild scattered mucosal thickening noted within the
sphenoethmoidal sinuses. Paranasal sinuses are otherwise clear. No
mastoid effusion.

Other: 1.2 cm well-circumscribed T2 hyperintense lesion noted within
the superior aspect of the right parotid gland (series 8, image 4),
indeterminate.

MRA HEAD FINDINGS

Anterior circulation: Visualized distal cervical segments of the
internal carotid arteries are widely patent with symmetric antegrade
flow. Petrous, cavernous, and supraclinoid segments widely patent
without stenosis or other abnormality. A1 segments widely patent.
Normal anterior communicating artery complex. Anterior cerebral
arteries patent to their distal aspects without stenosis. No M1
stenosis or occlusion. Normal MCA bifurcations. Distal MCA branches
well perfused and symmetric.

Posterior circulation: Vertebral arteries are largely code dominant
and widely patent to the vertebrobasilar junction without stenosis.
Left PICA origin patent and normal. Right PICA origin not well seen.
Basilar widely patent to its distal aspect without stenosis.
Superior cerebellar arteries are patent bilaterally. Both PCAs
primarily supplied via the basilar well perfused to their distal
aspects.

Anatomic variants: None significant. No aneurysm or other vascular
abnormality. A with surrounded on this is [REDACTED] sac on again
he does wanted a few of 1 follow-up CT few had a good
IMPRESSION: MRI HEAD IMPRESSION:

1. Subtle FLAIR signal abnormality involving the subcortical left
occipital lobe with associated patchy post-contrast enhancement.
Finding is nonspecific, but favored to reflect evolving changes of
subacute ischemia, left PCA distribution. No associated hemorrhage
or significant mass effect. Short-term interval follow-up
examination in 3 months is recommended to ensure resolution.
2. Underlying age advanced cerebral atrophy.
3. 1.2 cm T2 hyperintense lesion involving the superior aspect of
the right parotid gland, indeterminate. Nonemergent ENT referral and
consultation suggested for further workup and evaluation.

MRA HEAD IMPRESSION:

Normal intracranial MRA. No large vessel occlusion, hemodynamically
significant stenosis, or other acute vascular abnormality.

## 2021-12-27 ENCOUNTER — Other Ambulatory Visit: Payer: Self-pay | Admitting: Cardiology

## 2021-12-27 DIAGNOSIS — I5042 Chronic combined systolic (congestive) and diastolic (congestive) heart failure: Secondary | ICD-10-CM

## 2022-01-18 ENCOUNTER — Other Ambulatory Visit: Payer: Self-pay | Admitting: Cardiology

## 2022-01-18 DIAGNOSIS — I5042 Chronic combined systolic (congestive) and diastolic (congestive) heart failure: Secondary | ICD-10-CM

## 2022-01-19 ENCOUNTER — Other Ambulatory Visit: Payer: Self-pay | Admitting: Cardiology

## 2022-01-26 DIAGNOSIS — M79622 Pain in left upper arm: Secondary | ICD-10-CM | POA: Diagnosis not present

## 2022-01-26 DIAGNOSIS — G479 Sleep disorder, unspecified: Secondary | ICD-10-CM | POA: Diagnosis not present

## 2022-01-26 DIAGNOSIS — Z23 Encounter for immunization: Secondary | ICD-10-CM | POA: Diagnosis not present

## 2022-01-26 DIAGNOSIS — M79621 Pain in right upper arm: Secondary | ICD-10-CM | POA: Diagnosis not present

## 2022-04-07 DIAGNOSIS — I10 Essential (primary) hypertension: Secondary | ICD-10-CM | POA: Diagnosis not present

## 2022-04-07 DIAGNOSIS — E7800 Pure hypercholesterolemia, unspecified: Secondary | ICD-10-CM | POA: Diagnosis not present

## 2022-04-07 DIAGNOSIS — N1831 Chronic kidney disease, stage 3a: Secondary | ICD-10-CM | POA: Diagnosis not present

## 2022-04-07 DIAGNOSIS — F339 Major depressive disorder, recurrent, unspecified: Secondary | ICD-10-CM | POA: Diagnosis not present

## 2022-05-25 ENCOUNTER — Other Ambulatory Visit: Payer: Self-pay | Admitting: Cardiology

## 2022-06-11 ENCOUNTER — Other Ambulatory Visit: Payer: Self-pay | Admitting: Cardiology

## 2022-06-13 ENCOUNTER — Other Ambulatory Visit: Payer: Self-pay | Admitting: Cardiology

## 2022-06-21 ENCOUNTER — Encounter (HOSPITAL_COMMUNITY): Payer: Self-pay

## 2022-06-21 ENCOUNTER — Emergency Department (HOSPITAL_COMMUNITY): Payer: Medicare Other

## 2022-06-21 ENCOUNTER — Inpatient Hospital Stay (HOSPITAL_COMMUNITY)
Admission: EM | Admit: 2022-06-21 | Discharge: 2022-06-29 | DRG: 516 | Disposition: A | Discharge status: Skilled Nursing Facility | Payer: Medicare Other | Attending: Internal Medicine | Admitting: Internal Medicine

## 2022-06-21 ENCOUNTER — Other Ambulatory Visit: Payer: Self-pay

## 2022-06-21 DIAGNOSIS — Z23 Encounter for immunization: Secondary | ICD-10-CM | POA: Diagnosis not present

## 2022-06-21 DIAGNOSIS — E785 Hyperlipidemia, unspecified: Secondary | ICD-10-CM | POA: Diagnosis present

## 2022-06-21 DIAGNOSIS — Z79899 Other long term (current) drug therapy: Secondary | ICD-10-CM

## 2022-06-21 DIAGNOSIS — S32039A Unspecified fracture of third lumbar vertebra, initial encounter for closed fracture: Secondary | ICD-10-CM | POA: Diagnosis not present

## 2022-06-21 DIAGNOSIS — I5022 Chronic systolic (congestive) heart failure: Secondary | ICD-10-CM

## 2022-06-21 DIAGNOSIS — Z7984 Long term (current) use of oral hypoglycemic drugs: Secondary | ICD-10-CM

## 2022-06-21 DIAGNOSIS — W010XXA Fall on same level from slipping, tripping and stumbling without subsequent striking against object, initial encounter: Secondary | ICD-10-CM | POA: Diagnosis present

## 2022-06-21 DIAGNOSIS — S0990XA Unspecified injury of head, initial encounter: Secondary | ICD-10-CM | POA: Diagnosis not present

## 2022-06-21 DIAGNOSIS — E538 Deficiency of other specified B group vitamins: Secondary | ICD-10-CM | POA: Diagnosis not present

## 2022-06-21 DIAGNOSIS — M4856XA Collapsed vertebra, not elsewhere classified, lumbar region, initial encounter for fracture: Secondary | ICD-10-CM | POA: Diagnosis not present

## 2022-06-21 DIAGNOSIS — S32030A Wedge compression fracture of third lumbar vertebra, initial encounter for closed fracture: Secondary | ICD-10-CM | POA: Diagnosis not present

## 2022-06-21 DIAGNOSIS — K219 Gastro-esophageal reflux disease without esophagitis: Secondary | ICD-10-CM | POA: Diagnosis present

## 2022-06-21 DIAGNOSIS — R0902 Hypoxemia: Secondary | ICD-10-CM | POA: Diagnosis not present

## 2022-06-21 DIAGNOSIS — F064 Anxiety disorder due to known physiological condition: Secondary | ICD-10-CM | POA: Diagnosis not present

## 2022-06-21 DIAGNOSIS — W19XXXA Unspecified fall, initial encounter: Secondary | ICD-10-CM | POA: Diagnosis not present

## 2022-06-21 DIAGNOSIS — Z8673 Personal history of transient ischemic attack (TIA), and cerebral infarction without residual deficits: Secondary | ICD-10-CM | POA: Diagnosis not present

## 2022-06-21 DIAGNOSIS — Z96653 Presence of artificial knee joint, bilateral: Secondary | ICD-10-CM | POA: Diagnosis present

## 2022-06-21 DIAGNOSIS — M47816 Spondylosis without myelopathy or radiculopathy, lumbar region: Secondary | ICD-10-CM | POA: Diagnosis present

## 2022-06-21 DIAGNOSIS — Z818 Family history of other mental and behavioral disorders: Secondary | ICD-10-CM

## 2022-06-21 DIAGNOSIS — Z7982 Long term (current) use of aspirin: Secondary | ICD-10-CM

## 2022-06-21 DIAGNOSIS — I493 Ventricular premature depolarization: Secondary | ICD-10-CM

## 2022-06-21 DIAGNOSIS — F32A Depression, unspecified: Secondary | ICD-10-CM | POA: Diagnosis present

## 2022-06-21 DIAGNOSIS — F419 Anxiety disorder, unspecified: Secondary | ICD-10-CM | POA: Diagnosis not present

## 2022-06-21 DIAGNOSIS — I5042 Chronic combined systolic (congestive) and diastolic (congestive) heart failure: Secondary | ICD-10-CM | POA: Diagnosis not present

## 2022-06-21 DIAGNOSIS — E041 Nontoxic single thyroid nodule: Secondary | ICD-10-CM

## 2022-06-21 DIAGNOSIS — Z7401 Bed confinement status: Secondary | ICD-10-CM | POA: Diagnosis not present

## 2022-06-21 DIAGNOSIS — M549 Dorsalgia, unspecified: Secondary | ICD-10-CM | POA: Diagnosis not present

## 2022-06-21 DIAGNOSIS — Z6841 Body Mass Index (BMI) 40.0 and over, adult: Secondary | ICD-10-CM

## 2022-06-21 DIAGNOSIS — M2578 Osteophyte, vertebrae: Secondary | ICD-10-CM | POA: Diagnosis not present

## 2022-06-21 DIAGNOSIS — Z66 Do not resuscitate: Secondary | ICD-10-CM | POA: Diagnosis not present

## 2022-06-21 DIAGNOSIS — I130 Hypertensive heart and chronic kidney disease with heart failure and stage 1 through stage 4 chronic kidney disease, or unspecified chronic kidney disease: Secondary | ICD-10-CM | POA: Diagnosis not present

## 2022-06-21 DIAGNOSIS — S3992XA Unspecified injury of lower back, initial encounter: Secondary | ICD-10-CM | POA: Diagnosis not present

## 2022-06-21 DIAGNOSIS — R9431 Abnormal electrocardiogram [ECG] [EKG]: Secondary | ICD-10-CM | POA: Diagnosis not present

## 2022-06-21 DIAGNOSIS — I428 Other cardiomyopathies: Secondary | ICD-10-CM | POA: Diagnosis not present

## 2022-06-21 DIAGNOSIS — M5126 Other intervertebral disc displacement, lumbar region: Secondary | ICD-10-CM | POA: Diagnosis not present

## 2022-06-21 DIAGNOSIS — E7800 Pure hypercholesterolemia, unspecified: Secondary | ICD-10-CM | POA: Diagnosis not present

## 2022-06-21 DIAGNOSIS — Y9301 Activity, walking, marching and hiking: Secondary | ICD-10-CM | POA: Diagnosis present

## 2022-06-21 DIAGNOSIS — G2581 Restless legs syndrome: Secondary | ICD-10-CM | POA: Diagnosis present

## 2022-06-21 DIAGNOSIS — I440 Atrioventricular block, first degree: Secondary | ICD-10-CM

## 2022-06-21 DIAGNOSIS — Z043 Encounter for examination and observation following other accident: Secondary | ICD-10-CM | POA: Diagnosis not present

## 2022-06-21 DIAGNOSIS — E559 Vitamin D deficiency, unspecified: Secondary | ICD-10-CM | POA: Diagnosis not present

## 2022-06-21 DIAGNOSIS — E782 Mixed hyperlipidemia: Secondary | ICD-10-CM | POA: Diagnosis not present

## 2022-06-21 DIAGNOSIS — Z9049 Acquired absence of other specified parts of digestive tract: Secondary | ICD-10-CM

## 2022-06-21 DIAGNOSIS — Y92018 Other place in single-family (private) house as the place of occurrence of the external cause: Secondary | ICD-10-CM

## 2022-06-21 DIAGNOSIS — M1990 Unspecified osteoarthritis, unspecified site: Secondary | ICD-10-CM | POA: Diagnosis not present

## 2022-06-21 DIAGNOSIS — Z96642 Presence of left artificial hip joint: Secondary | ICD-10-CM | POA: Diagnosis present

## 2022-06-21 DIAGNOSIS — I10 Essential (primary) hypertension: Secondary | ICD-10-CM | POA: Diagnosis not present

## 2022-06-21 DIAGNOSIS — N1831 Chronic kidney disease, stage 3a: Secondary | ICD-10-CM | POA: Diagnosis not present

## 2022-06-21 DIAGNOSIS — Z8249 Family history of ischemic heart disease and other diseases of the circulatory system: Secondary | ICD-10-CM

## 2022-06-21 DIAGNOSIS — R61 Generalized hyperhidrosis: Secondary | ICD-10-CM | POA: Diagnosis not present

## 2022-06-21 DIAGNOSIS — I959 Hypotension, unspecified: Secondary | ICD-10-CM | POA: Diagnosis not present

## 2022-06-21 DIAGNOSIS — S32039D Unspecified fracture of third lumbar vertebra, subsequent encounter for fracture with routine healing: Secondary | ICD-10-CM | POA: Diagnosis not present

## 2022-06-21 DIAGNOSIS — W19XXXD Unspecified fall, subsequent encounter: Secondary | ICD-10-CM | POA: Diagnosis not present

## 2022-06-21 DIAGNOSIS — D649 Anemia, unspecified: Secondary | ICD-10-CM | POA: Diagnosis not present

## 2022-06-21 DIAGNOSIS — R270 Ataxia, unspecified: Secondary | ICD-10-CM | POA: Diagnosis not present

## 2022-06-21 DIAGNOSIS — S32009A Unspecified fracture of unspecified lumbar vertebra, initial encounter for closed fracture: Secondary | ICD-10-CM | POA: Diagnosis present

## 2022-06-21 DIAGNOSIS — R7303 Prediabetes: Secondary | ICD-10-CM | POA: Diagnosis not present

## 2022-06-21 LAB — COMPREHENSIVE METABOLIC PANEL
ALT: 25 U/L (ref 0–44)
AST: 26 U/L (ref 15–41)
Albumin: 3.5 g/dL (ref 3.5–5.0)
Alkaline Phosphatase: 142 U/L — ABNORMAL HIGH (ref 38–126)
Anion gap: 5 (ref 5–15)
BUN: 21 mg/dL (ref 8–23)
CO2: 29 mmol/L (ref 22–32)
Calcium: 8.7 mg/dL — ABNORMAL LOW (ref 8.9–10.3)
Chloride: 104 mmol/L (ref 98–111)
Creatinine, Ser: 0.89 mg/dL (ref 0.44–1.00)
GFR, Estimated: >60 mL/min (ref >60)
Glucose, Bld: 95 mg/dL (ref 70–99)
Potassium: 4.6 mmol/L (ref 3.5–5.1)
Sodium: 138 mmol/L (ref 135–145)
Total Bilirubin: 0.8 mg/dL (ref 0.3–1.2)
Total Protein: 6.6 g/dL (ref 6.5–8.1)

## 2022-06-21 LAB — CBC WITH DIFFERENTIAL/PLATELET
Abs Immature Granulocytes: 0.07 10*3/uL (ref 0.00–0.07)
Basophils Absolute: 0.1 10*3/uL (ref 0.0–0.1)
Basophils Relative: 1 %
Eosinophils Absolute: 0.2 10*3/uL (ref 0.0–0.5)
Eosinophils Relative: 2 %
HCT: 47.5 % — ABNORMAL HIGH (ref 36.0–46.0)
Hemoglobin: 15 g/dL (ref 12.0–15.0)
Immature Granulocytes: 1 %
Lymphocytes Relative: 8 %
Lymphs Abs: 0.8 10*3/uL (ref 0.7–4.0)
MCH: 30.2 pg (ref 26.0–34.0)
MCHC: 31.6 g/dL (ref 30.0–36.0)
MCV: 95.6 fL (ref 80.0–100.0)
Monocytes Absolute: 0.6 10*3/uL (ref 0.1–1.0)
Monocytes Relative: 6 %
Neutro Abs: 8.4 10*3/uL — ABNORMAL HIGH (ref 1.7–7.7)
Neutrophils Relative %: 82 %
Platelets: 208 10*3/uL (ref 150–400)
RBC: 4.97 MIL/uL (ref 3.87–5.11)
RDW: 14.1 % (ref 11.5–15.5)
WBC: 10.1 10*3/uL (ref 4.0–10.5)
nRBC: 0 % (ref 0.0–0.2)

## 2022-06-21 MED ORDER — ONDANSETRON HCL 4 MG/2ML IJ SOLN
4.0000 mg | Freq: Once | INTRAMUSCULAR | Status: AC
  Administered 2022-06-21: 4 mg via INTRAVENOUS
  Filled 2022-06-21: qty 2

## 2022-06-21 MED ORDER — DIAZEPAM 5 MG/ML IJ SOLN
5.0000 mg | Freq: Once | INTRAMUSCULAR | Status: AC
  Administered 2022-06-21: 5 mg via INTRAVENOUS
  Filled 2022-06-21: qty 2

## 2022-06-21 MED ORDER — KETOROLAC TROMETHAMINE 30 MG/ML IJ SOLN
30.0000 mg | Freq: Once | INTRAMUSCULAR | Status: AC
  Administered 2022-06-21: 30 mg via INTRAVENOUS
  Filled 2022-06-21: qty 1

## 2022-06-21 MED ORDER — HYDROMORPHONE HCL 1 MG/ML IJ SOLN
1.0000 mg | Freq: Once | INTRAMUSCULAR | Status: AC
  Administered 2022-06-21: 1 mg via INTRAVENOUS
  Filled 2022-06-21: qty 1

## 2022-06-21 NOTE — ED Provider Notes (Signed)
  Provider Note MRN:  [id number]  Arrival date & time: 06/21/22    ED Course and Medical Decision Making  Assumed care from Dr. Silverio Lay at shift change.  Mechanical fall today, fell onto her back.  Having difficulty even sitting up in bed.  Requiring frequent dosing of opioids.  CT and MRI imaging consistent with L3 fracture.  Case was discussed with Dr. Dutch Quint.  Patient is neurologically intact, no bowel or bladder dysfunction, no numbness or weakness.  No signs of cord involvement on the MRI.  Plan is for admission for pain control.  Cannot use TLSO given body habitus.  Procedures  Final Clinical Impressions(s) / ED Diagnoses     ICD-10-CM   1. Closed fracture of third lumbar vertebra, unspecified fracture morphology, initial encounter [facility])  W09.811B       ED Discharge Orders     None       Discharge Instructions   None     Elmer Sow. Pilar Plate, MD [facility] Emergency Medicine [facility] [email or web address]    Sabas Sous, MD 06/21/22 [phone]

## 2022-06-21 NOTE — ED Triage Notes (Addendum)
Pt from home via EMS with c/o of fall. Per pt, she lost her balance when trying to pick something up, fell back, and hit her head and back on carpet. Per EMS there is an abnormal indentation noted in the patient's lumbar region. Denies blood thinner, denies LOC.  Fentanyl IM

## 2022-06-21 NOTE — ED Notes (Signed)
Patient taken to MRI

## 2022-06-21 NOTE — Progress Notes (Signed)
Pt sp fall.  LBP without radicular sxn by report.  CT reviewed.  Minimal stenosis.   Should be a stable fracture.  Given size , I would not recommend bracing.  Mobilize with PT.  FU with me as outpatient

## 2022-06-21 NOTE — ED Provider Notes (Signed)
EMERGENCY DEPARTMENT AT [facility] Provider Note   CSN: [id number] Arrival date & time: 06/21/22  1701     History  Chief Complaint  Patient presents with   Fall    Paige [name] is a 77 y.o. female here presenting with fall.  Patient states that she went to pick up a [employer] and slipped and fell and hit her head and her back.  She states that she was unable to get up afterwards.  EMS got there and was concern for back injury.  Patient adamantly denies passing out.  Patient is not on blood thinners.  The history is provided by the patient.       Home Medications Prior to Admission medications   Medication Sig Start Date End Date Taking? Authorizing Provider  aspirin 81 MG chewable tablet Chew 81 mg by mouth daily.    [provider]  atorvastatin (LIPITOR) 40 MG tablet Take 1 tablet (40 mg total) by mouth daily. 05/08/20   Drema Dallas, MD  buPROPion (WELLBUTRIN XL) 150 MG 24 hr tablet Take 150 mg by mouth every morning. 09/10/21   [provider]  Calcium Carb-Cholecalciferol (CALCIUM 600+D3 PO) Take 1 tablet by mouth daily.    [provider]  carvedilol (COREG) 6.25 MG tablet Take 1 tablet (6.25 mg total) by mouth 2 (two) times daily with a meal. 01/19/22   Revankar, Aundra Dubin, MD  Cyanocobalamin (VITAMIN B 12 PO) Take 1 tablet by mouth daily.    [provider]  DULoxetine (CYMBALTA) 60 MG capsule Take 60 mg by mouth daily. 10/01/21   [provider]  JARDIANCE 25 MG TABS tablet Take 25 mg by mouth daily. 05/09/21   [provider]  mexiletine (MEXITIL) 250 MG capsule TAKE 1 CAPSULE(250 MG) BY MOUTH TWICE DAILY 06/13/22   Camnitz, Andree Coss, MD  Phenylephrine HCl (SINEX REGULAR NA) Place 1 spray into both nostrils daily as needed for congestion (congestion).    [provider]  pramipexole (MIRAPEX) 0.25 MG tablet Take 0.25 mg by mouth 2 (two) times daily.    [provider]   sacubitril-valsartan (ENTRESTO) 24-26 MG Take 1 tablet by mouth 2 (two) times daily. 12/27/21   Revankar, Aundra Dubin, MD  spironolactone (ALDACTONE) 25 MG tablet Take 0.5 tablets (12.5 mg total) by mouth daily. 01/18/22   Revankar, Aundra Dubin, MD  zolpidem (AMBIEN) 10 MG tablet Take 10 mg by mouth at bedtime as needed for sleep. 05/26/21   [provider]      Allergies    No known allergies    Review of Systems   Review of Systems  Musculoskeletal:  Positive for back pain.  All other systems reviewed and are negative.   Physical Exam Updated Vital Signs BP 137/75   Pulse 84   Temp 97.8 F (36.6 C) (Oral)   Resp 20   SpO2 96%  Physical Exam Vitals and nursing note reviewed.  HENT:     Head:     Comments: No obvious scalp hematoma.    Nose: Nose normal.     Mouth/Throat:     Mouth: Mucous membranes are moist.  Eyes:     Extraocular Movements: Extraocular movements intact.     Pupils: Pupils are equal, round, and reactive to light.  Cardiovascular:     Rate and Rhythm: Normal rate and regular rhythm.     Pulses: Normal pulses.     Heart sounds: Normal heart sounds.  Pulmonary:     Effort: Pulmonary effort is normal.     Breath sounds: Normal breath sounds.  Abdominal:     General: Abdomen is flat.     Palpations: Abdomen is soft.  Musculoskeletal:     Cervical back: Normal range of motion and neck supple.     Comments: + diffuse lumbar tenderness no upper thoracic tenderness. Nl ROM bilateral hips. No pelvic tenderness   Neurological:     Mental Status: She is alert.  Psychiatric:        Mood and Affect: Mood normal.        Behavior: Behavior normal.     ED Results / Procedures / Treatments   Labs (all labs ordered are listed, but only abnormal results are displayed) Labs Reviewed  CBC WITH DIFFERENTIAL/PLATELET - Abnormal; Notable for the following components:      Result Value   HCT 47.5 (*)    Neutro Abs 8.4 (*)    All other components within normal  limits  COMPREHENSIVE METABOLIC PANEL    EKG EKG Interpretation  Date/Time:  Tuesday Jun 21 2022 18:07:02 EDT Ventricular Rate:  80 PR Interval:  227 QRS Duration: 113 QT Interval:  397 QTC Calculation: 458 R Axis:   -21 Text Interpretation: Sinus rhythm Prolonged PR interval Borderline intraventricular conduction delay Low voltage, extremity leads Nonspecific T abnormalities, lateral leads Baseline wander in lead(s) V2 No significant change since last tracing Confirmed by Richardean Canal [phone]) on 06/21/2022 6:09:18 PM  Radiology No results found.  Procedures Procedures    Angiocath insertion Performed by: Richardean Canal  Consent: Verbal consent obtained. Risks and benefits: risks, benefits and alternatives were discussed Time out: Immediately prior to procedure a "time out" was called to verify the correct patient, procedure, equipment, support staff and site/side marked as required.  Preparation: Patient was prepped and draped in the usual sterile fashion.  Vein Location: L forearm   Ultrasound Guided  Gauge: 20 long   Normal blood return and flush without difficulty Patient tolerance: Patient tolerated the procedure well with no immediate complications.    Medications Ordered in ED Medications  HYDROmorphone (DILAUDID) injection 1 mg (1 mg Intravenous Given 06/21/22 1757)  ondansetron (ZOFRAN) injection 4 mg (4 mg Intravenous Given 06/21/22 1756)    ED Course/ Medical Decision Making/ A&P                             Medical Decision Making Paige [name] is a 77 y.o. female here presenting for fall.  Patient had a mechanical fall and hit her back and head.  Plan to get CT head and cervical spine and CT lumbar spine. Will give pain meds and reassess   9 pm Reviewed patient's labs and independently interviewed CT scans.  Patient has a age-indeterminate L3 fracture with retropulsion.  I discussed case with Dr. Dutch Quint.  He felt that it is likely a stable fracture.   However patient is still unable to even sit up due to pain.  Will get MRI lumbar spine to further evaluate.  If MRI does not show any cauda equina, he anticipate that patient just need PT and pain control and does not need transfer to [facility] and can follow-up with neurosurgery outpatient.  11:07 PM MRI lumbar spine pending.  Anticipate that patient will need admission for pain control and rehab.  If MRI shows nerve compression, patient will need neurosurgery to be reconsulted. Signed  out to Dr. Pilar Plate in the ED.   Amount and/or Complexity of Data Reviewed Labs: ordered. Radiology: ordered. ECG/medicine tests: ordered.  Risk Prescription drug management.    Final Clinical Impression(s) / ED Diagnoses Final diagnoses:  None    Rx / DC Orders ED Discharge Orders     None         Charlynne Pander, MD 06/21/22 2312

## 2022-06-22 DIAGNOSIS — S32039A Unspecified fracture of third lumbar vertebra, initial encounter for closed fracture: Secondary | ICD-10-CM | POA: Diagnosis not present

## 2022-06-22 DIAGNOSIS — I5022 Chronic systolic (congestive) heart failure: Secondary | ICD-10-CM

## 2022-06-22 DIAGNOSIS — I493 Ventricular premature depolarization: Secondary | ICD-10-CM

## 2022-06-22 DIAGNOSIS — F064 Anxiety disorder due to known physiological condition: Secondary | ICD-10-CM

## 2022-06-22 DIAGNOSIS — E041 Nontoxic single thyroid nodule: Secondary | ICD-10-CM

## 2022-06-22 DIAGNOSIS — S32009A Unspecified fracture of unspecified lumbar vertebra, initial encounter for closed fracture: Secondary | ICD-10-CM | POA: Diagnosis present

## 2022-06-22 DIAGNOSIS — I440 Atrioventricular block, first degree: Secondary | ICD-10-CM

## 2022-06-22 HISTORY — DX: Unspecified fracture of unspecified lumbar vertebra, initial encounter for closed fracture: S32.009A

## 2022-06-22 HISTORY — DX: Atrioventricular block, first degree: I44.0

## 2022-06-22 HISTORY — DX: Ventricular premature depolarization: I49.3

## 2022-06-22 HISTORY — DX: Chronic systolic (congestive) heart failure: I50.22

## 2022-06-22 HISTORY — DX: Nontoxic single thyroid nodule: E04.1

## 2022-06-22 LAB — TSH: TSH: 2.961 u[IU]/mL (ref 0.350–4.500)

## 2022-06-22 MED ORDER — DOCUSATE SODIUM 100 MG PO CAPS
100.0000 mg | ORAL_CAPSULE | Freq: Two times a day (BID) | ORAL | Status: DC
  Administered 2022-06-22 – 2022-06-28 (×13): 100 mg via ORAL
  Filled 2022-06-22 (×15): qty 1

## 2022-06-22 MED ORDER — ACETAMINOPHEN 325 MG PO TABS
650.0000 mg | ORAL_TABLET | Freq: Four times a day (QID) | ORAL | Status: DC | PRN
  Administered 2022-06-22: 650 mg via ORAL
  Filled 2022-06-22: qty 2

## 2022-06-22 MED ORDER — ATORVASTATIN CALCIUM 40 MG PO TABS
40.0000 mg | ORAL_TABLET | Freq: Every day | ORAL | Status: DC
  Administered 2022-06-22 – 2022-06-29 (×8): 40 mg via ORAL
  Filled 2022-06-22 (×8): qty 1

## 2022-06-22 MED ORDER — DULOXETINE HCL 60 MG PO CPEP
60.0000 mg | ORAL_CAPSULE | Freq: Every day | ORAL | Status: DC
  Administered 2022-06-22 – 2022-06-29 (×8): 60 mg via ORAL
  Filled 2022-06-22 (×8): qty 1

## 2022-06-22 MED ORDER — SACUBITRIL-VALSARTAN 24-26 MG PO TABS
1.0000 | ORAL_TABLET | Freq: Two times a day (BID) | ORAL | Status: DC
  Administered 2022-06-22 – 2022-06-29 (×15): 1 via ORAL
  Filled 2022-06-22 (×15): qty 1

## 2022-06-22 MED ORDER — NALOXONE HCL 0.4 MG/ML IJ SOLN: 0.4000 mg | INTRAMUSCULAR | Status: DC | PRN

## 2022-06-22 MED ORDER — PRAMIPEXOLE DIHYDROCHLORIDE 0.25 MG PO TABS
0.5000 mg | ORAL_TABLET | Freq: Every day | ORAL | Status: DC
  Administered 2022-06-22 – 2022-06-28 (×7): 0.5 mg via ORAL
  Filled 2022-06-22 (×7): qty 2

## 2022-06-22 MED ORDER — OXYCODONE HCL 5 MG PO TABS
5.0000 mg | ORAL_TABLET | ORAL | Status: DC | PRN
  Administered 2022-06-22 – 2022-06-24 (×4): 5 mg via ORAL
  Filled 2022-06-22 (×4): qty 1

## 2022-06-22 MED ORDER — PRAMIPEXOLE DIHYDROCHLORIDE 0.25 MG PO TABS
0.2500 mg | ORAL_TABLET | Freq: Every day | ORAL | Status: DC
  Administered 2022-06-22 – 2022-06-29 (×8): 0.25 mg via ORAL
  Filled 2022-06-22 (×8): qty 1

## 2022-06-22 MED ORDER — METHOCARBAMOL 1000 MG/10ML IJ SOLN
500.0000 mg | Freq: Three times a day (TID) | INTRAVENOUS | Status: DC
  Administered 2022-06-22: 500 mg via INTRAVENOUS
  Filled 2022-06-22: qty 500

## 2022-06-22 MED ORDER — BUPROPION HCL ER (XL) 150 MG PO TB24
150.0000 mg | ORAL_TABLET | Freq: Every morning | ORAL | Status: DC
  Administered 2022-06-22 – 2022-06-29 (×8): 150 mg via ORAL
  Filled 2022-06-22 (×8): qty 1

## 2022-06-22 MED ORDER — ACETAMINOPHEN 500 MG PO TABS
1000.0000 mg | ORAL_TABLET | Freq: Three times a day (TID) | ORAL | Status: AC
  Administered 2022-06-22 – 2022-06-24 (×9): 1000 mg via ORAL
  Filled 2022-06-22 (×9): qty 2

## 2022-06-22 MED ORDER — SPIRONOLACTONE 12.5 MG HALF TABLET
12.5000 mg | ORAL_TABLET | Freq: Every day | ORAL | Status: DC
  Administered 2022-06-22 – 2022-06-29 (×8): 12.5 mg via ORAL
  Filled 2022-06-22 (×8): qty 1

## 2022-06-22 MED ORDER — HYDROMORPHONE HCL 1 MG/ML IJ SOLN
0.5000 mg | INTRAMUSCULAR | Status: DC | PRN
  Administered 2022-06-22 – 2022-06-23 (×6): 0.5 mg via INTRAVENOUS
  Filled 2022-06-22 (×6): qty 0.5

## 2022-06-22 MED ORDER — METHOCARBAMOL 1000 MG/10ML IJ SOLN
500.0000 mg | Freq: Three times a day (TID) | INTRAVENOUS | Status: DC
  Administered 2022-06-22 – 2022-06-24 (×5): 500 mg via INTRAVENOUS
  Filled 2022-06-22: qty 500
  Filled 2022-06-22: qty 5
  Filled 2022-06-22 (×2): qty 500
  Filled 2022-06-22: qty 5
  Filled 2022-06-22: qty 500
  Filled 2022-06-22: qty 5

## 2022-06-22 MED ORDER — ACETAMINOPHEN 650 MG RE SUPP: 650.0000 mg | Freq: Four times a day (QID) | RECTAL | Status: DC | PRN

## 2022-06-22 MED ORDER — POLYETHYLENE GLYCOL 3350 17 G PO PACK: 17.0000 g | PACK | Freq: Every day | ORAL | Status: DC | PRN

## 2022-06-22 MED ORDER — SACUBITRIL-VALSARTAN 24-26 MG PO TABS
1.0000 | ORAL_TABLET | Freq: Two times a day (BID) | ORAL | Status: DC
  Filled 2022-06-22: qty 1

## 2022-06-22 MED ORDER — CARVEDILOL 3.125 MG PO TABS
3.1250 mg | ORAL_TABLET | Freq: Two times a day (BID) | ORAL | Status: DC
  Administered 2022-06-22 – 2022-06-29 (×14): 3.125 mg via ORAL
  Filled 2022-06-22 (×14): qty 1

## 2022-06-22 MED ORDER — PNEUMOCOCCAL 20-VAL CONJ VACC 0.5 ML IM SUSY
0.5000 mL | PREFILLED_SYRINGE | INTRAMUSCULAR | Status: AC
  Administered 2022-06-23: 0.5 mL via INTRAMUSCULAR
  Filled 2022-06-22: qty 0.5

## 2022-06-22 MED ORDER — EMPAGLIFLOZIN 25 MG PO TABS
25.0000 mg | ORAL_TABLET | Freq: Every day | ORAL | Status: DC
  Administered 2022-06-22 – 2022-06-29 (×8): 25 mg via ORAL
  Filled 2022-06-22 (×8): qty 1

## 2022-06-22 MED ORDER — MEXILETINE HCL 250 MG PO CAPS
250.0000 mg | ORAL_CAPSULE | Freq: Two times a day (BID) | ORAL | Status: DC
  Administered 2022-06-22 – 2022-06-29 (×15): 250 mg via ORAL
  Filled 2022-06-22 (×15): qty 1

## 2022-06-22 MED ORDER — PRAMIPEXOLE DIHYDROCHLORIDE 0.25 MG PO TABS
0.5000 mg | ORAL_TABLET | Freq: Every day | ORAL | Status: DC
  Filled 2022-06-22: qty 2

## 2022-06-22 NOTE — Consult Note (Signed)
Chief Complaint: Patient was seen in consultation today for L3 vertebral body augmentation Chief Complaint  Patient presents with   Fall    Referring Physician(s): Rai,R  Supervising Physician: Gilmer Mor  Patient Status: [facility] - In-pt  History of Present Illness: Paige [name] is a 77 y.o. female with past medical history significant for CHF, degenerative disc disease, nonischemic cardiomyopathy, PVCs, TIA, anemia, anxiety, osteoarthritis, chronic kidney disease, severe obesity, depression, GERD, hypertension, hyperlipidemia, lower extremity lymphedema prediabetes, restless leg syndrome, urinary incontinence, vitamin B12/D deficiency who was admitted to [facility] on 5/28 following mechanical fall at home with subsequent persistent mid back pain.  MRI of the lumbar spine revealed:   1. Marrow edema of the L3 vertebral body with irregularity of the superior and inferior endplates and mild retropulsion of the vertebral body into the spinal canal suggesting acute fracture. Approximately 25% central vertebral body height loss. 2. At L4-5 there is a broad-based disc bulge with moderate lateral recess stenosis bilaterally. Ligamentum flavum hypertrophy with spinal canal stenosis. Mild bilateral facet joint arthropathy. No significant neural foraminal stenosis. 3. At L5-S1 there is disc height loss and disc osteophyte complex with disc extrusion into bilateral lateral recesses. Moderate bilateral lateral recess stenosis. Moderate bilateral facet joint arthropathy. Mild right neural foraminal stenosis.  Despite use of oxycodone, Robaxin, Dilaudid, Tylenol patient continues to have moderate to severe mid back pain exacerbated with movement.  Patient denies radiation of pain to lower extremities or increasing weakness, numbness or tingling. Request now received from primary care team for consideration of L3 kyphoplasty.  Past Medical History:  Diagnosis Date   Acute  exacerbation of CHF (congestive heart failure) (HCC) 05/04/2020   Acute respiratory failure with hypoxia (HCC) 05/08/2020   Anemia    Anxiety disorder 05/04/2020   Arthritis    Arthritis of knee 09/25/2013   Arthritis of knee, left 09/25/2013   Arthritis of right knee 02/10/2014   Back pain    Chronic kidney disease, stage 3a (HCC) 05/04/2020   Class 3 severe obesity with serious comorbidity and body mass index (BMI) of 45.0 to 49.9 in adult [facility]) 03/29/2018   Congestive heart failure (HCC) 06/01/2021   Depression    Elevated troponin 05/08/2020   Esophageal reflux 05/04/2020   Essential hypertension 05/08/2020   Fatigue    Gallbladder problem    H/O hiatal hernia    Hearing loss 06/01/2021   High cholesterol 05/04/2020   Hyperlipidemia 05/08/2020   Insomnia 06/01/2021   Insulin resistance 03/29/2018   Interscapular region contusion 06/01/2021   Joint pain    Lymphedema of both lower extremities    Morbid obesity with BMI of 50.0-59.9, adult (HCC) 05/08/2020   Muscle pain 06/01/2021   Obesity    Osteoarthritis    Other long term (current) drug therapy 06/01/2021   Palpitations 11/24/2020   Perennial allergic rhinitis 01/10/2018   Pernicious anemia 06/01/2021   PONV (postoperative nausea and vomiting)    after Cholecysectomy   Prediabetes 06/01/2021   Primary osteoarthritis of left hip 11/26/2015   Primary osteoarthritis of right knee 02/09/2014   Recurrent major depression (HCC) 06/01/2021   Restless leg syndrome 05/08/2020   Rhinitis medicamentosa 01/10/2018   Sleep disturbance 06/01/2021   Systolic and diastolic CHF, acute (HCC) 05/08/2020   Tremor 06/01/2021   Urinary incontinence 06/01/2021   Vasomotor rhinitis 01/10/2018   Vertigo    Vitamin B 12 deficiency    Vitamin D deficiency 03/29/2018    Past Surgical History:  Procedure Laterality Date   ABDOMINAL HYSTERECTOMY     CHOLECYSTECTOMY     COLONOSCOPY     EYE SURGERY Bilateral    cataracts   FRACTURE SURGERY Right    arm age 30   FRACTURE SURGERY  Bilateral    both thumbs   LAPAROSCOPIC GASTRIC BANDING     2010   OVARIAN CYST REMOVAL     RIGHT/LEFT HEART CATH AND CORONARY ANGIOGRAPHY N/A 05/07/2020   Procedure: RIGHT/LEFT HEART CATH AND CORONARY ANGIOGRAPHY;  Surgeon: Orpah Cobb, MD;  Location: [facility] INVASIVE CV LAB;  Service: Cardiovascular;  Laterality: N/A;   TOTAL HIP ARTHROPLASTY Left 11/30/2015   Procedure: TOTAL HIP ARTHROPLASTY ANTERIOR APPROACH;  Surgeon: Gean Birchwood, MD;  Location: [facility] OR;  Service: Orthopedics;  Laterality: Left;   TOTAL KNEE ARTHROPLASTY Left 09/25/2013   Procedure: TOTAL KNEE ARTHROPLASTY;  Surgeon: Nestor Lewandowsky, MD;  Location: [facility] OR;  Service: Orthopedics;  Laterality: Left;   TOTAL KNEE ARTHROPLASTY Right 02/10/2014   dr Turner Daniels   TOTAL KNEE ARTHROPLASTY Right 02/10/2014   Procedure: TOTAL KNEE ARTHROPLASTY;  Surgeon: Nestor Lewandowsky, MD;  Location: [facility] OR;  Service: Orthopedics;  Laterality: Right;    Allergies: No known allergies  Medications: Prior to Admission medications   Medication Sig Start Date End Date Taking? Authorizing Provider  aspirin 81 MG chewable tablet Chew 81 mg by mouth daily.   Yes [provider]  atorvastatin (LIPITOR) 40 MG tablet Take 1 tablet (40 mg total) by mouth daily. 05/08/20  Yes Drema Dallas, MD  buPROPion (WELLBUTRIN XL) 150 MG 24 hr tablet Take 150 mg by mouth every morning. 09/10/21  Yes [provider]  carvedilol (COREG) 6.25 MG tablet Take 1 tablet (6.25 mg total) by mouth 2 (two) times daily with a meal. 01/19/22  Yes Revankar, Aundra Dubin, MD  Cyanocobalamin (VITAMIN B 12 PO) Take 1 tablet by mouth daily.   Yes [provider]  DULoxetine (CYMBALTA) 60 MG capsule Take 60 mg by mouth daily. 10/01/21  Yes [provider]  JARDIANCE 25 MG TABS tablet Take 25 mg by mouth daily. 05/09/21  Yes [provider]  mexiletine (MEXITIL) 250 MG capsule TAKE 1 CAPSULE(250 MG) BY MOUTH TWICE DAILY 06/13/22  Yes Camnitz, Will Daphine Deutscher, MD   Phenylephrine HCl (SINEX REGULAR NA) Place 1 spray into both nostrils daily as needed for congestion (congestion).   Yes [provider]  pramipexole (MIRAPEX) 0.25 MG tablet Take 0.25-0.5 mg by mouth 2 (two) times daily. Take 1 tablet (0.25 mg) in the morning and Take 2 tablets (0.5 mg) at bedtime   Yes [provider]  sacubitril-valsartan (ENTRESTO) 24-26 MG Take 1 tablet by mouth 2 (two) times daily. 12/27/21  Yes Revankar, Aundra Dubin, MD  spironolactone (ALDACTONE) 25 MG tablet Take 0.5 tablets (12.5 mg total) by mouth daily. 01/18/22  Yes Revankar, Aundra Dubin, MD  zolpidem (AMBIEN) 10 MG tablet Take 10 mg by mouth at bedtime. 05/26/21  Yes [provider]  furosemide (LASIX) 40 MG tablet Take 40 mg by mouth daily as needed for fluid or edema.    [provider]     Family History  Problem Relation Age of Onset   Hypertension Mother    Depression Mother     Social History   Socioeconomic History   Marital status: Widowed    Spouse name: Not on file   Number of children: Not on file   Years of education: Not on file  Highest education level: Not on file  Occupational History   Occupation: Retired  Tobacco Use   Smoking status: Never   Smokeless tobacco: Never  Substance and Sexual Activity   Alcohol use: Yes    Comment: rarely   Drug use: No   Sexual activity: Not on file  Other Topics Concern   Not on file  Social History Narrative   Not on file   Social Determinants of Health   Financial Resource Strain: Low Risk  (07/01/2020)   Overall Financial Resource Strain (CARDIA)    Difficulty of Paying Living Expenses: Not hard at all  Food Insecurity: No Food Insecurity (06/22/2022)   Hunger Vital Sign    Worried About Running Out of Food in the Last Year: Never true    Ran Out of Food in the Last Year: Never true  Transportation Needs: No Transportation Needs (06/22/2022)   PRAPARE - Administrator, Civil Service (Medical): No     Lack of Transportation (Non-Medical): No  Physical Activity: Not on file  Stress: Not on file  Social Connections: Not on file      Review of Systems currently denies fever, chest pain, dyspnea, cough, abdominal pain, nausea, vomiting or bleeding.  She does have occasional headaches and above-mentioned mid back pain.  Vital Signs: BP 125/73 (BP Location: Right Arm)   Pulse 95   Temp 97.6 F (36.4 C) (Oral)   Resp 18   Ht 5\' 7"  (1.702 m)   Wt (!) 336 lb 14.4 oz (152.8 kg)   SpO2 94%   BMI 52.77 kg/m   Advance Care Plan: No advanced care documents on file   Physical Exam: Patient awake, alert.  Chest clear to auscultation bilaterally.  Heart with regular rate and rhythm.  Abdomen obese, soft, positive bowel sounds, nontender.  Bilateral lower extremity lymphedema.  Moderate paravertebral tenderness L3 region.  Imaging: MR LUMBAR SPINE WO CONTRAST  Result Date: 06/21/2022 CLINICAL DATA:  Ataxia, lumbar trauma.  Back pain. EXAM: MRI LUMBAR SPINE WITHOUT CONTRAST TECHNIQUE: Multiplanar, multisequence MR imaging of the lumbar spine was performed. No intravenous contrast was administered. COMPARISON:  CT examination performed earlier on the same date. FINDINGS: Segmentation:   5 non rib-bearing lumbar type vertebral bodies are present. The lowest fully formed vertebral body is L5. Alignment:  Physiologic. Vertebrae: Marrow edema of the L3 vertebral body with irregularity of the superior and inferior endplates and mild retropulsion of the vertebral body into the spinal canal suggesting acute fracture. Approximately 25% central vertebral body height loss. No evidence of discitis. Hemangioma in the L1 vertebral body. Conus medullaris and cauda equina: Conus extends to the L1 level. Conus and cauda equina appear normal. Paraspinal and other soft tissues: Negative. Disc levels: T12-L1: No significant disc bulge. No neural foraminal stenosis. No central canal stenosis. L1-L2: No significant disc  bulge. No neural foraminal stenosis. No central canal stenosis. L2-L3: No significant disc bulge. No neural foraminal stenosis. No central canal stenosis. Mild bilateral facet joint arthropathy. L3-L4: No significant disc bulge. No neural foraminal stenosis. No central canal stenosis. Mild bilateral facet joint arthropathy. L4-L5: Broad-based disc bulge with moderate lateral recess stenosis bilaterally. Ligamentum flavum hypertrophy with spinal canal stenosis. Mild bilateral facet joint arthropathy. No significant neural foraminal stenosis. L5-S1: Disc height loss and disc osteophyte complex with disc extrusion into bilateral lateral recesses. Moderate bilateral lateral recess stenosis. Moderate bilateral facet joint arthropathy. Mild right neural foraminal stenosis. IMPRESSION: 1. Marrow edema of the L3 vertebral  body with irregularity of the superior and inferior endplates and mild retropulsion of the vertebral body into the spinal canal suggesting acute fracture. Approximately 25% central vertebral body height loss. 2. At L4-5 there is a broad-based disc bulge with moderate lateral recess stenosis bilaterally. Ligamentum flavum hypertrophy with spinal canal stenosis. Mild bilateral facet joint arthropathy. No significant neural foraminal stenosis. 3. At L5-S1 there is disc height loss and disc osteophyte complex with disc extrusion into bilateral lateral recesses. Moderate bilateral lateral recess stenosis. Moderate bilateral facet joint arthropathy. Mild right neural foraminal stenosis. Electronically Signed   By: Larose Hires D.O.   On: 06/21/2022 23:13   CT Lumbar Spine Wo Contrast  Result Date: 06/21/2022 CLINICAL DATA:  Ataxia, lumbar trauma EXAM: CT LUMBAR SPINE WITHOUT CONTRAST TECHNIQUE: Multidetector CT imaging of the lumbar spine was performed without intravenous contrast administration. Multiplanar CT image reconstructions were also generated. RADIATION DOSE REDUCTION: This exam was performed  according to the departmental dose-optimization program which includes automated exposure control, adjustment of the mA and/or kV according to patient size and/or use of iterative reconstruction technique. COMPARISON:  MRI 10/12/2014 FINDINGS: Segmentation: 5 lumbar type vertebrae. Alignment: Normal. Vertebrae: There is an age indeterminate fracture of the L3 vertebral body involving both the superior and inferior endplate as well as the posterior wall with 4 mm retropulsion of the posteroinferior aspect of the L3 vertebral body multiple fracture planes are visible anteriorly suggesting that this fracture may be acute to subacute in nature. The posterior elements appear intact and there is no facet subluxation. No other fracture identified. Remaining vertebral body height is preserved. Paraspinal and other soft tissues: There is mild paravertebral infiltration in keeping with trace edema or hemorrhage. No loculated paraspinal fluid collection identified. Disc levels: There is intervertebral disc space narrowing and endplate remodeling, most severe at L4-S1 in keeping with changes of moderate to severe degenerative disc disease. Retropulsed fracture fragment at L3 abuts the thecal sac without significant remodeling. Multifactorial moderate to severe central canal stenosis at L3-4 and L4-5, not well characterized on this examination. Associated bilateral mild-to-moderate neuroforaminal narrowing at these levels, right greater than left. IMPRESSION: 1. Age-indeterminate fracture of the L3 vertebral body involving both the superior and inferior endplate as well as the posterior wall with 4 mm retropulsion of the posteroinferior aspect of the L3 vertebral body, suspected acute to subacute. The posterior elements appear intact and there is no facet subluxation. MRI examination would be more helpful for further evaluation. 2. Multifactorial moderate to severe central canal stenosis at L3-4 and L4-5, not well characterized  on this examination. Associated bilateral mild-to-moderate neuroforaminal narrowing at these levels, right greater than left. These findings would also be better assessed with MRI examination. Electronically Signed   By: Helyn Numbers M.D.   On: 06/21/2022 20:08   CT HEAD WO CONTRAST ( )  Result Date: 06/21/2022 CLINICAL DATA:  Fall EXAM: CT HEAD WITHOUT CONTRAST CT CERVICAL SPINE WITHOUT CONTRAST TECHNIQUE: Multidetector CT imaging of the head and cervical spine was performed following the standard protocol without intravenous contrast. Multiplanar CT image reconstructions of the cervical spine were also generated. RADIATION DOSE REDUCTION: This exam was performed according to the departmental dose-optimization program which includes automated exposure control, adjustment of the mA and/or kV according to patient size and/or use of iterative reconstruction technique. COMPARISON:  MR brain, 11/15/2020 CT chest 06/19/2003 FINDINGS: CT HEAD FINDINGS Brain: No evidence of acute infarction, hemorrhage, hydrocephalus, extra-axial collection or mass lesion/mass effect. Vascular: No hyperdense vessel  or unexpected calcification. Skull: Hyperostosis frontalis. Negative for fracture or focal lesion. Sinuses/Orbits: No acute finding. Other: None. CT CERVICAL SPINE FINDINGS Alignment: Degenerative straightening of the normal cervical lordosis. Skull base and vertebrae: No acute fracture. No primary bone lesion or focal pathologic process. Soft tissues and spinal canal: No prevertebral fluid or swelling. No visible canal hematoma. Disc levels: Moderate to severe multilevel disc space height loss and osteophytosis, worst from C5-C7 Upper chest: Negative. Other: Hypodense left lobe thyroid nodule measuring 2.7 x 2.5 cm (series 11, image 79). IMPRESSION: 1. No acute intracranial pathology. 2. No fracture or static subluxation of the cervical spine. 3. Moderate to severe multilevel cervical disc degenerative disease. 4.  Hypodense left lobe thyroid nodule measuring 2.7 x 2.5 cm. Recommend thyroid ultrasound for further evaluation if not already performed, on a nonemergent, outpatient basis (ref: J Am Coll Radiol. 2015 Feb;12(2): 143-50). Electronically Signed   By: Jearld Lesch M.D.   On: 06/21/2022 18:43   CT Cervical Spine Wo Contrast  Result Date: 06/21/2022 CLINICAL DATA:  Fall EXAM: CT HEAD WITHOUT CONTRAST CT CERVICAL SPINE WITHOUT CONTRAST TECHNIQUE: Multidetector CT imaging of the head and cervical spine was performed following the standard protocol without intravenous contrast. Multiplanar CT image reconstructions of the cervical spine were also generated. RADIATION DOSE REDUCTION: This exam was performed according to the departmental dose-optimization program which includes automated exposure control, adjustment of the mA and/or kV according to patient size and/or use of iterative reconstruction technique. COMPARISON:  MR brain, 11/15/2020 CT chest 06/19/2003 FINDINGS: CT HEAD FINDINGS Brain: No evidence of acute infarction, hemorrhage, hydrocephalus, extra-axial collection or mass lesion/mass effect. Vascular: No hyperdense vessel or unexpected calcification. Skull: Hyperostosis frontalis. Negative for fracture or focal lesion. Sinuses/Orbits: No acute finding. Other: None. CT CERVICAL SPINE FINDINGS Alignment: Degenerative straightening of the normal cervical lordosis. Skull base and vertebrae: No acute fracture. No primary bone lesion or focal pathologic process. Soft tissues and spinal canal: No prevertebral fluid or swelling. No visible canal hematoma. Disc levels: Moderate to severe multilevel disc space height loss and osteophytosis, worst from C5-C7 Upper chest: Negative. Other: Hypodense left lobe thyroid nodule measuring 2.7 x 2.5 cm (series 11, image 79). IMPRESSION: 1. No acute intracranial pathology. 2. No fracture or static subluxation of the cervical spine. 3. Moderate to severe multilevel cervical disc  degenerative disease. 4. Hypodense left lobe thyroid nodule measuring 2.7 x 2.5 cm. Recommend thyroid ultrasound for further evaluation if not already performed, on a nonemergent, outpatient basis (ref: J Am Coll Radiol. 2015 Feb;12(2): 143-50). Electronically Signed   By: Jearld Lesch M.D.   On: 06/21/2022 18:43    Labs:  CBC: Recent Labs    06/21/22 1741  WBC 10.1  HGB 15.0  HCT 47.5*  PLT 208    COAGS: No results for input(s): "INR", "APTT" in the last 8760 hours.  BMP: Recent Labs    06/21/22 1741  NA 138  K 4.6  CL 104  CO2 29  GLUCOSE 95  BUN 21  CALCIUM 8.7*  CREATININE 0.89  GFRNONAA >60    LIVER FUNCTION TESTS: Recent Labs    06/21/22 1741  BILITOT 0.8  AST 26  ALT 25  ALKPHOS 142*  PROT 6.6  ALBUMIN 3.5    TUMOR MARKERS: No results for input(s): "AFPTM", "CEA", "CA199", "CHROMGRNA" in the last 8760 hours.  Assessment and Plan: 77 y.o. female with past medical history significant for CHF, degenerative disc disease, nonischemic cardiomyopathy, PVCs, TIA, anemia, anxiety, osteoarthritis,  chronic kidney disease, severe obesity, depression, GERD, hypertension, hyperlipidemia, lower extremity lymphedema prediabetes, restless leg syndrome, urinary incontinence, vitamin B12/D deficiency who was admitted to [facility] on 5/28 following mechanical fall at home with subsequent persistent mid back pain.  MRI of the lumbar spine revealed:   1. Marrow edema of the L3 vertebral body with irregularity of the superior and inferior endplates and mild retropulsion of the vertebral body into the spinal canal suggesting acute fracture. Approximately 25% central vertebral body height loss. 2. At L4-5 there is a broad-based disc bulge with moderate lateral recess stenosis bilaterally. Ligamentum flavum hypertrophy with spinal canal stenosis. Mild bilateral facet joint arthropathy. No significant neural foraminal stenosis. 3. At L5-S1 there is disc height loss  and disc osteophyte complex with disc extrusion into bilateral lateral recesses. Moderate bilateral lateral recess stenosis. Moderate bilateral facet joint arthropathy. Mild right neural foraminal stenosis.  Despite use of oxycodone, Robaxin, Dilaudid, Tylenol patient continues to have moderate to severe mid back pain exacerbated with movement.  Patient denies radiation of pain to lower extremities or increasing weakness, numbness or tingling. Request now received from primary care team for consideration of L3 kyphoplasty. Patient is currently afebrile, WBC normal, hemoglobin normal, platelets normal, creat nl, PT/INR pend. Imaging studies were reviewed by Dr. Loreta Ave and pt appears to be candidate for L3 vertebral body augmentation. Risks and benefits of procedure were discussed with the patient including, but not limited to education regarding the natural healing process of compression fractures without intervention, bleeding, infection, cement migration which may cause spinal cord damage, paralysis, pulmonary embolism or even death.  This interventional procedure involves the use of X-rays and because of the nature of the planned procedure, it is possible that we will have prolonged use of X-ray fluoroscopy.  Potential radiation risks to you include (but are not limited to) the following: - A slightly elevated risk for cancer  several years later in life. This risk is typically less than 0.5% percent. This risk is low in comparison to the normal incidence of human cancer, which is 33% for women and 50% for men according to the [employer]. - Radiation induced injury can include skin redness, resembling a rash, tissue breakdown / ulcers and hair loss (which can be temporary or permanent).   The likelihood of either of these occurring depends on the difficulty of the procedure and whether you are sensitive to radiation due to previous procedures, disease, or genetic conditions.   IF  your procedure requires a prolonged use of radiation, you will be notified and given written instructions for further action.  It is your responsibility to monitor the irradiated area for the 2 weeks following the procedure and to notify your physician if you are concerned that you have suffered a radiation induced injury.    All of the patient's questions were answered, patient is agreeable to proceed.  Will initiate insurance preauthorization; pt will need to hold aspirin for 5 days before procedure; will TENT place on IR schedule for 6/3; will cont to monitor and update pt once insurance coverage determined     Thank you for this interesting consult.  I greatly enjoyed meeting Paige [name] and look forward to participating in their care.  A copy of this report was sent to the requesting provider on this date.  Electronically Signed: D. Jeananne Rama, PA-C 06/22/2022, 1:27 PM   I spent a total of 30 minutes    in face to face in clinical  consultation, greater than 50% of which was counseling/coordinating care for possible L3 vertebral body augmentation

## 2022-06-22 NOTE — Evaluation (Signed)
Physical Therapy Evaluation Patient Details Name: Paige [name] MRN: [id number] DOB: October 02, 1945 Today's Date: 06/22/2022  History of Present Illness  Paige [name] is a 77 y.o. female with medical history significant of chronic HFrEF due to nonischemic cardiomyopathy, PVCs, hypertension, hyperlipidemia, TIA, class III obesity (BMI 52.77), anxiety, depression, GERD, LTHA, osteoarthritis, RLS presented to ED via EMS from home due to concern for back injury after a mechanical fall.MRI of lumbar spine showing: 1. Marrow edema of the L3 vertebral body with irregularity of the  superior and inferior endplates and mild retropulsion of the  vertebral body into the spinal canal suggesting acute fracture.At L4-5 there is a broad-based disc bulge, At L5-S1 there is disc height loss and disc osteophyte complex  with disc extrusion into bilateral lateral recesses. IR consult pending.  Clinical Impression  Pt admitted with above diagnosis.  Pt currently with functional limitations due to the deficits listed below (see PT Problem List). Pt will benefit from acute skilled PT to increase their independence and safety with mobility to allow discharge.     The patient is motivated to attempt mobility. Patient has been premedicated for  pain.  Patient reporting pain on right lower back/hip with  rolling and sitting up with 2 persons. Patient did stand at Rw and s slowly stepped x 4 along bed.  Patient is independent   and drives at baseline, lives alone.        Recommendations for follow up therapy are one component of a multi-disciplinary discharge planning process, led by the attending physician.  Recommendations may be updated based on patient status, additional functional criteria and insurance authorization.  Follow Up Recommendations Can patient physically be transported by private vehicle: No     Assistance Recommended at Discharge Frequent or constant Supervision/Assistance  Patient can return home  with the following  Two people to help with walking and/or transfers;Two people to help with bathing/dressing/bathroom;Assistance with cooking/housework;Assist for transportation;Help with stairs or ramp for entrance    Equipment Recommendations None recommended by PT  Recommendations for Other Services       Functional Status Assessment Patient has had a recent decline in their functional status and demonstrates the ability to make significant improvements in function in a reasonable and predictable amount of time.     Precautions / Restrictions Precautions Precautions: Fall Restrictions Weight Bearing Restrictions: No      Mobility  Bed Mobility               General bed mobility comments: cues for log roll, assisted legs and trunk    Transfers   Equipment used: Rolling walker (2 wheels)               General transfer comment: Pt able to side step along EOB to rail/HOB with RW    Ambulation/Gait                  Stairs            Wheelchair Mobility    Modified Rankin (Stroke Patients Only)       Balance                                             Pertinent Vitals/Pain Pain Assessment Pain Score: 8  Pain Location: right lower back/hip Pain Descriptors / Indicators: Throbbing, Moaning, Grimacing, Guarding Pain Intervention(s): Monitored during  session, Premedicated before session, Repositioned    Home Living Family/patient expects to be discharged to:: Private residence Living Arrangements: Alone Available Help at Discharge: Friend(s);Available PRN/intermittently Type of Home: House Home Access: Stairs to enter   Entrance Stairs-Number of Steps: 1   Home Layout: One level Home Equipment: Agricultural consultant (2 wheels);Other (comment);BSC/3in1;Grab bars - tub/shower;Adaptive equipment (walk in tub)      Prior Function Prior Level of Function : Independent/Modified Independent             Mobility Comments:  used RW for mobility, drives ADLs Comments: Ind with ADLs/selfcare, IADLs, home mgt, cooks, drives     Hand Dominance   Dominant Hand: Right    Extremity/Trunk Assessment   Upper Extremity Assessment Upper Extremity Assessment: Generalized weakness    Lower Extremity Assessment Lower Extremity Assessment: Generalized weakness (bil  edema)    Cervical / Trunk Assessment Cervical / Trunk Assessment: Other exceptions Cervical / Trunk Exceptions: large body habitus  Communication   Communication: No difficulties  Cognition                                                General Comments      Exercises     Assessment/Plan    PT Assessment Patient needs continued PT services  PT Problem List Decreased strength;Decreased mobility;Decreased safety awareness;Decreased knowledge of precautions;Decreased activity tolerance;Decreased balance;Pain;Obesity       PT Treatment Interventions DME instruction;Therapeutic activities;Gait training;Therapeutic exercise;Patient/family education;Functional mobility training    PT Goals (Current goals can be found in the Care Plan section)  Acute Rehab PT Goals Patient Stated Goal: to go home, no pain PT Goal Formulation: With patient Time For Goal Achievement: 07/06/22 Potential to Achieve Goals: Fair    Frequency Min 1X/week     Co-evaluation PT/OT/SLP Co-Evaluation/Treatment: Yes Reason for Co-Treatment: To address functional/ADL transfers   OT goals addressed during session: ADL's and self-care;Proper use of Adaptive equipment and DME       AM-PAC PT "6 Clicks" Mobility  Outcome Measure Help needed turning from your back to your side while in a flat bed without using bedrails?: A Lot Help needed moving from lying on your back to sitting on the side of a flat bed without using bedrails?: A Lot Help needed moving to and from a bed to a chair (including a wheelchair)?: Total Help needed standing up from a  chair using your arms (e.g., wheelchair or bedside chair)?: Total Help needed to walk in hospital room?: Total Help needed climbing 3-5 steps with a railing? : Total 6 Click Score: 8    End of Session Equipment Utilized During Treatment: Gait belt Activity Tolerance: Patient limited by pain Patient left: in bed;with call bell/phone within reach;with bed alarm set Nurse Communication: Mobility status PT Visit Diagnosis: Unsteadiness on feet (R26.81);Difficulty in walking, not elsewhere classified (R26.2);History of falling (Z91.81);Pain Pain - Right/Left: Right Pain - part of body: Hip    Time: 7829-5621 PT Time Calculation (min) (ACUTE ONLY): 26 min   Charges:   PT Evaluation $PT Eval Low Complexity: 1 Low          Blanchard Kelch PT Acute Rehabilitation Services Office [phone] Weekend [phone]  Rada Hay 06/22/2022, 3:12 PM

## 2022-06-22 NOTE — Evaluation (Signed)
Occupational Therapy Evaluation Patient Details Name: Paige [name] MRN: [id number] DOB: [birth year] Today's Date: 06/22/2022   History of Present Illness Paige [name] is a 77 y.o. female with medical history significant of chronic HFrEF due to nonischemic cardiomyopathy, PVCs, hypertension, hyperlipidemia, TIA, class III obesity (BMI 52.77), anxiety, depression, GERD, LTHA, osteoarthritis, RLS presented to ED via EMS from home due to concern for back injury after a mechanical fall.MRI of lumbar spine showing: 1. Marrow edema of the L3 vertebral body with irregularity of the  superior and inferior endplates and mild retropulsion of the  vertebral body into the spinal canal suggesting acute fracture.At L4-5 there is a broad-based disc bulge, At L5-S1 there is disc height loss and disc osteophyte complex  with disc extrusion into bilateral lateral recesses   Clinical Impression   Pt presents with decline in function and safety with ADLs and ADL mobility with impaired strength, balance and endurance; limited by back pain. PTA pt lived alone, was Ind - Mod I with ADLs, IADLs, home mgt, cooking drives and used RW for mobility. Pt currently requires max - mod A +2 to sit EOB, max - total A with LB ADLs, min guard A with UB ADLs/grooming, total A wit toileting at bed level and min A +2 for sit - stand transitions with RW. Pt would benefit from acute OT services to address impairments to maximize level of function and safety     Recommendations for follow up therapy are one component of a multi-disciplinary discharge planning process, led by the attending physician.  Recommendations may be updated based on patient status, additional functional criteria and insurance authorization.   Assistance Recommended at Discharge Frequent or constant Supervision/Assistance  Patient can return home with the following A lot of help with bathing/dressing/bathroom;Two people to help with walking and/or  transfers;Assistance with cooking/housework;Assist for transportation;Help with stairs or ramp for entrance    Functional Status Assessment  Patient has had a recent decline in their functional status and demonstrates the ability to make significant improvements in function in a reasonable and predictable amount of time.  Equipment Recommendations   (TBD at next venue of care)    Recommendations for Other Services       Precautions / Restrictions Precautions Precautions: Fall Restrictions Weight Bearing Restrictions: No      Mobility Bed Mobility Overal bed mobility: Needs Assistance Bed Mobility: Rolling, Sidelying to Sit, Sit to Sidelying Rolling: Max assist, +2 for physical assistance Sidelying to sit: Mod assist, +2 for physical assistance, HOB elevated     Sit to sidelying: Max assist, +2 for physical assistance      Transfers Overall transfer level: Needs assistance Equipment used: Rolling walker (2 wheels) Transfers: Sit to/from Stand Sit to Stand: Min assist, +2 physical assistance, +2 safety/equipment           General transfer comment: Pt able to side step along EOB to rail/HOB with RW      Balance Overall balance assessment: Needs assistance Sitting-balance support: Bilateral upper extremity supported, Feet supported Sitting balance-Leahy Scale: Fair     Standing balance support: Bilateral upper extremity supported, During functional activity Standing balance-Leahy Scale: Poor                             ADL either performed or assessed with clinical judgement   ADL Overall ADL's : Needs assistance/impaired Eating/Feeding: Independent   Grooming: Wash/dry face;Wash/dry hands;Min guard;Sitting   Upper Body  Bathing: Min guard;Sitting   Lower Body Bathing: Maximal assistance;Sitting/lateral leans Lower Body Bathing Details (indicate cue type and reason): simulated sitting EOB Upper Body Dressing : Min guard;Sitting   Lower Body  Dressing: Total assistance       Toileting- Clothing Manipulation and Hygiene: Total assistance;Bed level       Functional mobility during ADLs: Minimal assistance;+2 for physical assistance;Rolling walker (2 wheels) General ADL Comments: Pt has ADL A/E at home, reviewed use of A/E     Vision Baseline Vision/History: 1 Wears glasses Ability to See in Adequate Light: 0 Adequate Patient Visual Report: No change from baseline       Perception     Praxis      Pertinent Vitals/Pain Pain Assessment Pain Assessment: 0-10 Pain Score: 8  Pain Descriptors / Indicators: Throbbing, Moaning, Grimacing, Guarding Pain Intervention(s): Limited activity within patient's tolerance, Premedicated before session, Monitored during session, Repositioned     Hand Dominance Right   Extremity/Trunk Assessment Upper Extremity Assessment Upper Extremity Assessment: Generalized weakness   Lower Extremity Assessment Lower Extremity Assessment: Defer to PT evaluation   Cervical / Trunk Assessment Cervical / Trunk Assessment: Other exceptions Cervical / Trunk Exceptions: large body habitus   Communication Communication Communication: No difficulties   Cognition Arousal/Alertness: Awake/alert Behavior During Therapy: WFL for tasks assessed/performed Overall Cognitive Status: Within Functional Limits for tasks assessed                                       General Comments       Exercises     Shoulder Instructions      Home Living Family/patient expects to be discharged to:: Private residence Living Arrangements: Alone Available Help at Discharge: Friend(s);Available PRN/intermittently Type of Home: House Home Access: Stairs to enter Entrance Stairs-Number of Steps: 1   Home Layout: One level     Bathroom Shower/Tub: Tub/shower unit;Walk-in shower   Bathroom Toilet: Standard     Home Equipment: Agricultural consultant (2 wheels);Other (comment);BSC/3in1;Grab bars -  tub/shower;Adaptive equipment (walk in tub) Adaptive Equipment: Reacher;Long-handled shoe horn;Long-handled sponge        Prior Functioning/Environment Prior Level of Function : Independent/Modified Independent             Mobility Comments: used RW for mobility, drives ADLs Comments: Ind with ADLs/selfcare, IADLs, home mgt, cooks, drives        OT Problem List: Decreased strength;Decreased activity tolerance;Increased edema;Impaired balance (sitting and/or standing);Obesity;Pain      OT Treatment/Interventions: Self-care/ADL training;Balance training;DME and/or AE instruction;Therapeutic exercise;Therapeutic activities;Patient/family education    OT Goals(Current goals can be found in the care plan section) Acute Rehab OT Goals Patient Stated Goal: less pain OT Goal Formulation: With patient Time For Goal Achievement: 07/06/22 Potential to Achieve Goals: Good ADL Goals Pt Will Perform Grooming: with supervision;with set-up;sitting Pt Will Perform Upper Body Bathing: sitting;with supervision;with set-up Pt Will Perform Lower Body Bathing: with mod assist;sitting/lateral leans Pt Will Perform Upper Body Dressing: with supervision;with set-up;sitting Pt Will Transfer to Toilet: with mod assist;with min assist;stand pivot transfer;bedside commode Pt Will Perform Toileting - Clothing Manipulation and hygiene: sit to/from stand  OT Frequency: Min 2X/week    Co-evaluation PT/OT/SLP Co-Evaluation/Treatment: Yes Reason for Co-Treatment: To address functional/ADL transfers   OT goals addressed during session: ADL's and self-care;Proper use of Adaptive equipment and DME      AM-PAC OT "6 Clicks" Daily Activity  Outcome Measure Help from another person eating meals?: None Help from another person taking care of personal grooming?: A Little Help from another person toileting, which includes using toliet, bedpan, or urinal?: Total Help from another person bathing (including  washing, rinsing, drying)?: A Lot Help from another person to put on and taking off regular upper body clothing?: A Little Help from another person to put on and taking off regular lower body clothing?: Total 6 Click Score: 14   End of Session Equipment Utilized During Treatment: Gait belt;Rolling walker (2 wheels)  Activity Tolerance: Patient limited by pain Patient left: in bed;with call bell/phone within reach  OT Visit Diagnosis: Other abnormalities of gait and mobility (R26.89);Unsteadiness on feet (R26.81);History of falling (Z91.81);Muscle weakness (generalized) (M62.81);Pain Pain - part of body:  (back)                Time: 1610-9604 OT Time Calculation (min): 26 min Charges:  OT General Charges $OT Visit: 1 Visit OT Evaluation $OT Eval Moderate Complexity: 1 Mod    Galen Manila 06/22/2022, 1:18 PM

## 2022-06-22 NOTE — H&P (Signed)
History and Physical    Paige [name] ZOX:[id number] DOB: 10/11/1945 DOA: 06/21/2022  PCP: [name], MD  Patient coming from: Home  Chief Complaint: Fall  HPI: Paige [name] is a 78 y.o. female with medical history significant of chronic HFrEF due to nonischemic cardiomyopathy, PVCs, hypertension, hyperlipidemia, TIA, class III obesity (BMI 52.77), anxiety, depression, GERD, osteoarthritis, RLS presented to ED via EMS from home due to concern for back injury after a mechanical fall.  Vital signs stable on arrival.  CBC without significant abnormalities.  CMP showing alk phos slightly elevated at 142 with normal remainder of LFTs.  CT head/C-spine negative for acute traumatic injuries.  MRI of lumbar spine showing: "IMPRESSION: 1. Marrow edema of the L3 vertebral body with irregularity of the superior and inferior endplates and mild retropulsion of the vertebral body into the spinal canal suggesting acute fracture. Approximately 25% central vertebral body height loss. 2. At L4-5 there is a broad-based disc bulge with moderate lateral recess stenosis bilaterally. Ligamentum flavum hypertrophy with spinal canal stenosis. Mild bilateral facet joint arthropathy. No significant neural foraminal stenosis. 3. At L5-S1 there is disc height loss and disc osteophyte complex with disc extrusion into bilateral lateral recesses. Moderate bilateral lateral recess stenosis. Moderate bilateral facet joint arthropathy. Mild right neural foraminal stenosis."  Neurosurgery recommended physical therapy and outpatient follow-up.  Patient received Valium, Toradol, Dilaudid, and Zofran in the ED.  Patient states she was picking up packages in front of her house and as she was walking back into the house, she lost her balance and fell backwards and injured her back.  Denies head injury or loss of consciousness.  She required assistance to get up from the floor but was able to walk.  Denies any  weakness or numbness in her extremities.  Denies saddle anesthesia and bowel/bladder dysfunction.  She reports chronic bilateral pedal edema and thinks this is stable and not any worse from her baseline.  Denies fevers, cough, shortness of breath, chest pain, vomiting, or abdominal pain.  Review of Systems:  Review of Systems  All other systems reviewed and are negative.   Past Medical History:  Diagnosis Date   Acute exacerbation of CHF (congestive heart failure) (HCC) 05/04/2020   Acute respiratory failure with hypoxia (HCC) 05/08/2020   Anemia    Anxiety disorder 05/04/2020   Arthritis    Arthritis of knee 09/25/2013   Arthritis of knee, left 09/25/2013   Arthritis of right knee 02/10/2014   Back pain    Chronic kidney disease, stage 3a (HCC) 05/04/2020   Class 3 severe obesity with serious comorbidity and body mass index (BMI) of 45.0 to 49.9 in adult [facility]) 03/29/2018   Congestive heart failure (HCC) 06/01/2021   Depression    Elevated troponin 05/08/2020   Esophageal reflux 05/04/2020   Essential hypertension 05/08/2020   Fatigue    Gallbladder problem    H/O hiatal hernia    Hearing loss 06/01/2021   High cholesterol 05/04/2020   Hyperlipidemia 05/08/2020   Insomnia 06/01/2021   Insulin resistance 03/29/2018   Interscapular region contusion 06/01/2021   Joint pain    Lymphedema of both lower extremities    Morbid obesity with BMI of 50.0-59.9, adult (HCC) 05/08/2020   Muscle pain 06/01/2021   Obesity    Osteoarthritis    Other long term (current) drug therapy 06/01/2021   Palpitations 11/24/2020   Perennial allergic rhinitis 01/10/2018   Pernicious anemia 06/01/2021   PONV (postoperative nausea and vomiting)  after Cholecysectomy   Prediabetes 06/01/2021   Primary osteoarthritis of left hip 11/26/2015   Primary osteoarthritis of right knee 02/09/2014   Recurrent major depression (HCC) 06/01/2021   Restless leg syndrome 05/08/2020   Rhinitis medicamentosa 01/10/2018   Sleep disturbance 06/01/2021    Systolic and diastolic CHF, acute (HCC) 05/08/2020   Tremor 06/01/2021   Urinary incontinence 06/01/2021   Vasomotor rhinitis 01/10/2018   Vertigo    Vitamin B 12 deficiency    Vitamin D deficiency 03/29/2018    Past Surgical History:  Procedure Laterality Date   ABDOMINAL HYSTERECTOMY     CHOLECYSTECTOMY     COLONOSCOPY     EYE SURGERY Bilateral    cataracts   FRACTURE SURGERY Right    arm age 29   FRACTURE SURGERY Bilateral    both thumbs   LAPAROSCOPIC GASTRIC BANDING     2010   OVARIAN CYST REMOVAL     RIGHT/LEFT HEART CATH AND CORONARY ANGIOGRAPHY N/A 05/07/2020   Procedure: RIGHT/LEFT HEART CATH AND CORONARY ANGIOGRAPHY;  Surgeon: Orpah Cobb, MD;  Location: [facility];  Service: Cardiovascular;  Laterality: N/A;   TOTAL HIP ARTHROPLASTY Left 11/30/2015   Procedure: TOTAL HIP ARTHROPLASTY ANTERIOR APPROACH;  Surgeon: Gean Birchwood, MD;  Location: [facility] OR;  Service: Orthopedics;  Laterality: Left;   TOTAL KNEE ARTHROPLASTY Left 09/25/2013   Procedure: TOTAL KNEE ARTHROPLASTY;  Surgeon: Nestor Lewandowsky, MD;  Location: [facility] OR;  Service: Orthopedics;  Laterality: Left;   TOTAL KNEE ARTHROPLASTY Right 02/10/2014   dr Turner Daniels   TOTAL KNEE ARTHROPLASTY Right 02/10/2014   Procedure: TOTAL KNEE ARTHROPLASTY;  Surgeon: Nestor Lewandowsky, MD;  Location: [facility] OR;  Service: Orthopedics;  Laterality: Right;     reports that she has never smoked. She has never used smokeless tobacco. She reports current alcohol use. She reports that she does not use drugs.  Allergies  Allergen Reactions   No Known Allergies     Family History  Problem Relation Age of Onset   Hypertension Mother    Depression Mother     Prior to Admission medications   Medication Sig Start Date End Date Taking? Authorizing Provider  aspirin 81 MG chewable tablet Chew 81 mg by mouth daily.   Yes [provider]  atorvastatin (LIPITOR) 40 MG tablet Take 1 tablet (40 mg total) by mouth daily. 05/08/20  Yes Drema Dallas, MD  buPROPion (WELLBUTRIN XL) 150 MG 24 hr tablet Take 150 mg by mouth every morning. 09/10/21  Yes [provider]  carvedilol (COREG) 6.25 MG tablet Take 1 tablet (6.25 mg total) by mouth 2 (two) times daily with a meal. 01/19/22  Yes Revankar, Aundra Dubin, MD  Cyanocobalamin (VITAMIN B 12 PO) Take 1 tablet by mouth daily.   Yes [provider]  DULoxetine (CYMBALTA) 60 MG capsule Take 60 mg by mouth daily. 10/01/21  Yes [provider]  JARDIANCE 25 MG TABS tablet Take 25 mg by mouth daily. 05/09/21  Yes [provider]  mexiletine (MEXITIL) 250 MG capsule TAKE 1 CAPSULE(250 MG) BY MOUTH TWICE DAILY 06/13/22  Yes Camnitz, Will Daphine Deutscher, MD  Phenylephrine HCl (SINEX REGULAR NA) Place 1 spray into both nostrils daily as needed for congestion (congestion).   Yes [provider]  pramipexole (MIRAPEX) 0.25 MG tablet Take 0.25-0.5 mg by mouth 2 (two) times daily. Take 1 tablet (0.25 mg) in the morning and Take 2 tablets (0.5 mg) at bedtime   Yes [provider]  sacubitril-valsartan (ENTRESTO) 24-26 MG Take 1 tablet by mouth 2 (two) times daily. 12/27/21  Yes Revankar, Aundra Dubin, MD  spironolactone (ALDACTONE) 25 MG tablet Take 0.5 tablets (12.5 mg total) by mouth daily. 01/18/22  Yes Revankar, Aundra Dubin, MD  zolpidem (AMBIEN) 10 MG tablet Take 10 mg by mouth at bedtime. 05/26/21  Yes [provider]  furosemide (LASIX) 40 MG tablet Take 40 mg by mouth daily as needed for fluid or edema.    [provider]    Physical Exam: Vitals:   06/21/22 2045 06/21/22 2117 06/22/22 0130 06/22/22 0134  BP: 111/65  118/60   Pulse: 93  86   Resp: 14  17   Temp:  98.2 F (36.8 C) 97.8 F (36.6 C)   TempSrc:  Oral Oral   SpO2: 97%  100%   Weight:    (!) 152.8 kg  Height:    5\' 7"  (1.702 m)    Physical Exam Vitals reviewed.  Constitutional:      General: She is not in acute distress. HENT:     Head: Normocephalic and atraumatic.  Eyes:      Extraocular Movements: Extraocular movements intact.  Cardiovascular:     Rate and Rhythm: Normal rate and regular rhythm.     Pulses: Normal pulses.  Pulmonary:     Effort: Pulmonary effort is normal. No respiratory distress.     Breath sounds: Normal breath sounds. No wheezing or rales.  Abdominal:     General: Bowel sounds are normal. There is no distension.     Palpations: Abdomen is soft.     Tenderness: There is no abdominal tenderness.  Musculoskeletal:     Cervical back: Normal range of motion.     Right lower leg: Edema present.     Left lower leg: Edema present.     Comments: Bilateral pedal edema, nonpitting  Skin:    General: Skin is warm and dry.  Neurological:     General: No focal deficit present.     Mental Status: She is alert and oriented to person, place, and time.     Sensory: No sensory deficit.     Motor: No weakness.     Comments: Strength 5 out of 5 in bilateral upper and lower extremities and sensation intact.     Labs on Admission: I have personally reviewed following labs and imaging studies  CBC: Recent Labs  Lab 06/21/22 1741  WBC 10.1  NEUTROABS 8.4*  HGB 15.0  HCT 47.5*  MCV 95.6  PLT 208   Basic Metabolic Panel: Recent Labs  Lab 06/21/22 1741  NA 138  K 4.6  CL 104  CO2 29  GLUCOSE 95  BUN 21  CREATININE 0.89  CALCIUM 8.7*   GFR: Estimated Creatinine Clearance: 82 mL/min (by C-G formula based on SCr of 0.89 mg/dL). Liver Function Tests: Recent Labs  Lab 06/21/22 1741  AST 26  ALT 25  ALKPHOS 142*  BILITOT 0.8  PROT 6.6  ALBUMIN 3.5   No results for input(s): "LIPASE", "AMYLASE" in the last 168 hours. No results for input(s): "AMMONIA" in the last 168 hours. Coagulation Profile: No results for input(s): "INR", "PROTIME" in the last 168 hours. Cardiac Enzymes: No results for input(s): "CKTOTAL", "CKMB", "CKMBINDEX", "TROPONINI" in the last 168 hours. BNP (last 3 results) No results for input(s): "PROBNP" in the  last 8760 hours. HbA1C: No results for input(s): "HGBA1C" in the last 72 hours. CBG: No results for input(s): "GLUCAP" in the  last 168 hours. Lipid Profile: No results for input(s): "CHOL", "HDL", "LDLCALC", "TRIG", "CHOLHDL", "LDLDIRECT" in the last 72 hours. Thyroid Function Tests: No results for input(s): "TSH", "T4TOTAL", "FREET4", "T3FREE", "THYROIDAB" in the last 72 hours. Anemia Panel: No results for input(s): "VITAMINB12", "FOLATE", "FERRITIN", "TIBC", "IRON", "RETICCTPCT" in the last 72 hours. Urine analysis:    Component Value Date/Time   COLORURINE YELLOW 11/19/2015 0943   APPEARANCEUR CLEAR 11/19/2015 0943   LABSPEC 1.018 11/19/2015 0943   PHURINE 7.5 11/19/2015 0943   GLUCOSEU NEGATIVE 11/19/2015 0943   HGBUR NEGATIVE 11/19/2015 0943   BILIRUBINUR NEGATIVE 11/19/2015 0943   KETONESUR NEGATIVE 11/19/2015 0943   PROTEINUR NEGATIVE 11/19/2015 0943   UROBILINOGEN 1.0 01/31/2014 1414   NITRITE NEGATIVE 11/19/2015 0943   LEUKOCYTESUR NEGATIVE 11/19/2015 0943    Radiological Exams on Admission: MR LUMBAR SPINE WO CONTRAST  Result Date: 06/21/2022 CLINICAL DATA:  Ataxia, lumbar trauma.  Back pain. EXAM: MRI LUMBAR SPINE WITHOUT CONTRAST TECHNIQUE: Multiplanar, multisequence MR imaging of the lumbar spine was performed. No intravenous contrast was administered. COMPARISON:  CT examination performed earlier on the same date. FINDINGS: Segmentation:   5 non rib-bearing lumbar type vertebral bodies are present. The lowest fully formed vertebral body is L5. Alignment:  Physiologic. Vertebrae: Marrow edema of the L3 vertebral body with irregularity of the superior and inferior endplates and mild retropulsion of the vertebral body into the spinal canal suggesting acute fracture. Approximately 25% central vertebral body height loss. No evidence of discitis. Hemangioma in the L1 vertebral body. Conus medullaris and cauda equina: Conus extends to the L1 level. Conus and cauda equina appear  normal. Paraspinal and other soft tissues: Negative. Disc levels: T12-L1: No significant disc bulge. No neural foraminal stenosis. No central canal stenosis. L1-L2: No significant disc bulge. No neural foraminal stenosis. No central canal stenosis. L2-L3: No significant disc bulge. No neural foraminal stenosis. No central canal stenosis. Mild bilateral facet joint arthropathy. L3-L4: No significant disc bulge. No neural foraminal stenosis. No central canal stenosis. Mild bilateral facet joint arthropathy. L4-L5: Broad-based disc bulge with moderate lateral recess stenosis bilaterally. Ligamentum flavum hypertrophy with spinal canal stenosis. Mild bilateral facet joint arthropathy. No significant neural foraminal stenosis. L5-S1: Disc height loss and disc osteophyte complex with disc extrusion into bilateral lateral recesses. Moderate bilateral lateral recess stenosis. Moderate bilateral facet joint arthropathy. Mild right neural foraminal stenosis. IMPRESSION: 1. Marrow edema of the L3 vertebral body with irregularity of the superior and inferior endplates and mild retropulsion of the vertebral body into the spinal canal suggesting acute fracture. Approximately 25% central vertebral body height loss. 2. At L4-5 there is a broad-based disc bulge with moderate lateral recess stenosis bilaterally. Ligamentum flavum hypertrophy with spinal canal stenosis. Mild bilateral facet joint arthropathy. No significant neural foraminal stenosis. 3. At L5-S1 there is disc height loss and disc osteophyte complex with disc extrusion into bilateral lateral recesses. Moderate bilateral lateral recess stenosis. Moderate bilateral facet joint arthropathy. Mild right neural foraminal stenosis. Electronically Signed   By: Larose Hires D.O.   On: 06/21/2022 23:13   CT Lumbar Spine Wo Contrast  Result Date: 06/21/2022 CLINICAL DATA:  Ataxia, lumbar trauma EXAM: CT LUMBAR SPINE WITHOUT CONTRAST TECHNIQUE: Multidetector CT imaging of the  lumbar spine was performed without intravenous contrast administration. Multiplanar CT image reconstructions were also generated. RADIATION DOSE REDUCTION: This exam was performed according to the departmental dose-optimization program which includes automated exposure control, adjustment of the mA and/or kV according to patient size and/or use  of iterative reconstruction technique. COMPARISON:  MRI 10/12/2014 FINDINGS: Segmentation: 5 lumbar type vertebrae. Alignment: Normal. Vertebrae: There is an age indeterminate fracture of the L3 vertebral body involving both the superior and inferior endplate as well as the posterior wall with 4 mm retropulsion of the posteroinferior aspect of the L3 vertebral body multiple fracture planes are visible anteriorly suggesting that this fracture may be acute to subacute in nature. The posterior elements appear intact and there is no facet subluxation. No other fracture identified. Remaining vertebral body height is preserved. Paraspinal and other soft tissues: There is mild paravertebral infiltration in keeping with trace edema or hemorrhage. No loculated paraspinal fluid collection identified. Disc levels: There is intervertebral disc space narrowing and endplate remodeling, most severe at L4-S1 in keeping with changes of moderate to severe degenerative disc disease. Retropulsed fracture fragment at L3 abuts the thecal sac without significant remodeling. Multifactorial moderate to severe central canal stenosis at L3-4 and L4-5, not well characterized on this examination. Associated bilateral mild-to-moderate neuroforaminal narrowing at these levels, right greater than left. IMPRESSION: 1. Age-indeterminate fracture of the L3 vertebral body involving both the superior and inferior endplate as well as the posterior wall with 4 mm retropulsion of the posteroinferior aspect of the L3 vertebral body, suspected acute to subacute. The posterior elements appear intact and there is no  facet subluxation. MRI examination would be more helpful for further evaluation. 2. Multifactorial moderate to severe central canal stenosis at L3-4 and L4-5, not well characterized on this examination. Associated bilateral mild-to-moderate neuroforaminal narrowing at these levels, right greater than left. These findings would also be better assessed with MRI examination. Electronically Signed   By: Helyn Numbers M.D.   On: 06/21/2022 20:08   CT HEAD WO CONTRAST ( )  Result Date: 06/21/2022 CLINICAL DATA:  Fall EXAM: CT HEAD WITHOUT CONTRAST CT CERVICAL SPINE WITHOUT CONTRAST TECHNIQUE: Multidetector CT imaging of the head and cervical spine was performed following the standard protocol without intravenous contrast. Multiplanar CT image reconstructions of the cervical spine were also generated. RADIATION DOSE REDUCTION: This exam was performed according to the departmental dose-optimization program which includes automated exposure control, adjustment of the mA and/or kV according to patient size and/or use of iterative reconstruction technique. COMPARISON:  MR brain, 11/15/2020 CT chest 06/19/2003 FINDINGS: CT HEAD FINDINGS Brain: No evidence of acute infarction, hemorrhage, hydrocephalus, extra-axial collection or mass lesion/mass effect. Vascular: No hyperdense vessel or unexpected calcification. Skull: Hyperostosis frontalis. Negative for fracture or focal lesion. Sinuses/Orbits: No acute finding. Other: None. CT CERVICAL SPINE FINDINGS Alignment: Degenerative straightening of the normal cervical lordosis. Skull base and vertebrae: No acute fracture. No primary bone lesion or focal pathologic process. Soft tissues and spinal canal: No prevertebral fluid or swelling. No visible canal hematoma. Disc levels: Moderate to severe multilevel disc space height loss and osteophytosis, worst from C5-C7 Upper chest: Negative. Other: Hypodense left lobe thyroid nodule measuring 2.7 x 2.5 cm (series 11, image 79).  IMPRESSION: 1. No acute intracranial pathology. 2. No fracture or static subluxation of the cervical spine. 3. Moderate to severe multilevel cervical disc degenerative disease. 4. Hypodense left lobe thyroid nodule measuring 2.7 x 2.5 cm. Recommend thyroid ultrasound for further evaluation if not already performed, on a nonemergent, outpatient basis (ref: J Am Coll Radiol. 2015 Feb;12(2): 143-50). Electronically Signed   By: Jearld Lesch M.D.   On: 06/21/2022 18:43   CT Cervical Spine Wo Contrast  Result Date: 06/21/2022 CLINICAL DATA:  Fall EXAM: CT HEAD WITHOUT  CONTRAST CT CERVICAL SPINE WITHOUT CONTRAST TECHNIQUE: Multidetector CT imaging of the head and cervical spine was performed following the standard protocol without intravenous contrast. Multiplanar CT image reconstructions of the cervical spine were also generated. RADIATION DOSE REDUCTION: This exam was performed according to the departmental dose-optimization program which includes automated exposure control, adjustment of the mA and/or kV according to patient size and/or use of iterative reconstruction technique. COMPARISON:  MR brain, 11/15/2020 CT chest 06/19/2003 FINDINGS: CT HEAD FINDINGS Brain: No evidence of acute infarction, hemorrhage, hydrocephalus, extra-axial collection or mass lesion/mass effect. Vascular: No hyperdense vessel or unexpected calcification. Skull: Hyperostosis frontalis. Negative for fracture or focal lesion. Sinuses/Orbits: No acute finding. Other: None. CT CERVICAL SPINE FINDINGS Alignment: Degenerative straightening of the normal cervical lordosis. Skull base and vertebrae: No acute fracture. No primary bone lesion or focal pathologic process. Soft tissues and spinal canal: No prevertebral fluid or swelling. No visible canal hematoma. Disc levels: Moderate to severe multilevel disc space height loss and osteophytosis, worst from C5-C7 Upper chest: Negative. Other: Hypodense left lobe thyroid nodule measuring 2.7 x 2.5  cm (series 11, image 79). IMPRESSION: 1. No acute intracranial pathology. 2. No fracture or static subluxation of the cervical spine. 3. Moderate to severe multilevel cervical disc degenerative disease. 4. Hypodense left lobe thyroid nodule measuring 2.7 x 2.5 cm. Recommend thyroid ultrasound for further evaluation if not already performed, on a nonemergent, outpatient basis (ref: J Am Coll Radiol. 2015 Feb;12(2): 143-50). Electronically Signed   By: Jearld Lesch M.D.   On: 06/21/2022 18:43    EKG: Independently reviewed.  Sinus rhythm with first-degree AV block, baseline wander in multiple leads.  PR interval increased since prior tracing.  Assessment and Plan  Acute L3 vertebral body fracture -Secondary to mechanical fall. -MRI of lumbar spine showing: "IMPRESSION: 1. Marrow edema of the L3 vertebral body with irregularity of the superior and inferior endplates and mild retropulsion of the vertebral body into the spinal canal suggesting acute fracture. Approximately 25% central vertebral body height loss. 2. At L4-5 there is a broad-based disc bulge with moderate lateral recess stenosis bilaterally. Ligamentum flavum hypertrophy with spinal canal stenosis. Mild bilateral facet joint arthropathy. No significant neural foraminal stenosis. 3. At L5-S1 there is disc height loss and disc osteophyte complex with disc extrusion into bilateral lateral recesses. Moderate bilateral lateral recess stenosis. Moderate bilateral facet joint arthropathy. Mild right neural foraminal stenosis." -Patient is neurologically intact.  No saddle anesthesia or bowel or bladder dysfunction. No lower extremity numbness or weakness. -Neurosurgery recommended physical therapy and outpatient follow-up. -PT/OT consult, fall precautions -Continue pain management  Incidental thyroid nodule CT showing hypodense left lobe thyroid nodule measuring 2.7 x 2.5 cm.  Radiologist recommending nonemergent thyroid ultrasound on  an outpatient basis.  Check TSH.  First-degree AV block PR interval >200 on EKG although poor quality study with baseline wander in multiple leads.  PR interval was normal on previous EKG from over a year ago.  Check TSH and repeat EKG in the morning.  Hold Coreg until repeat EKG is done.  Chronic HFrEF due to nonischemic cardiomyopathy Echo done in August 2022 showing EF 30 to 35%, grade 1 diastolic dysfunction. She has nonpitting bilateral pedal edema which appears to be chronic and patient is not endorsing any respiratory symptoms.  Lungs clear on exam.  She desatted after IV opiates in the ED and was placed on 2 L [place] but was not hypoxic previously.  Continue Jardiance, Entresto, and spironolactone.  Holding  Coreg until repeat EKG is done given concern for new AV block.  History of PVCs Continue mexiletine.  Hypertension Stable.  Continue home meds except holding Coreg at this time until repeat EKG is done.  Hyperlipidemia Continue Lipitor.  Anxiety and depression Continue bupropion and duloxetine.  RLS Continue Mirapex.  DVT prophylaxis: SCDs Code Status: DNR/DNI (discussed with the patient) Family Communication: No family available at this time. Level of care: Telemetry bed Admission status: It is my clinical opinion that referral for OBSERVATION is reasonable and necessary in this patient based on the above information provided. The aforementioned taken together are felt to place the patient at high risk for further clinical deterioration. However, it is anticipated that the patient may be medically stable for discharge from the hospital within 24 to 48 hours.   John Giovanni MD [facility]  If 7PM-7AM, please contact night-coverage [email or web address]  06/22/2022, 1:57 AM

## 2022-06-22 NOTE — ED Notes (Signed)
ED TO INPATIENT HANDOFF REPORT  Name/Age/Gender Paige [name] 77 y.o. female  Code Status Code Status History     Date Active Date Inactive Code Status Order ID Comments User Context   05/06/2020 1015 05/08/2020 2336 DNR [id number]  Drema Dallas, MD Inpatient   05/04/2020 2313 05/06/2020 1015 Full Code [id number]  Rometta Emery, MD ED   11/30/2015 1428 12/02/2015 1910 Full Code [id number]  Gean Birchwood, MD Inpatient   02/10/2014 1529 02/12/2014 1647 Full Code [id number]  Allena Katz, PA-C Inpatient   09/25/2013 1917 09/27/2013 1537 Full Code [id number]  Nestor Lewandowsky, MD Inpatient    Questions for Most Recent Historical Code Status (Order [id number])     Question Answer   In the event of cardiac or respiratory ARREST Do not call a "code blue"   In the event of cardiac or respiratory ARREST Do not perform Intubation, CPR, defibrillation or ACLS   In the event of cardiac or respiratory ARREST Use medication by any route, position, wound care, and other measures to relive pain and suffering. May use oxygen, suction and manual treatment of airway obstruction as needed for comfort.                Advance Directive Documentation    Flowsheet Row Most Recent Value  Type of Advance Directive Healthcare Power of Attorney, Out of facility DNR (pink MOST or yellow form)  Pre-existing out of facility DNR order (yellow form or pink MOST form) --  "MOST" Form in Place? --       Home/SNF/Other Home  Chief Complaint Lumbar vertebral fracture (HCC) [S32.009A]  Level of Care/Admitting Diagnosis ED Disposition     ED Disposition  Admit   Condition  --   Comment  Hospital Area: [facility] [[id number]]  Level of Care: Telemetry [5]  Admit to tele based on following criteria: Complex arrhythmia (Bradycardia/Tachycardia)  May place patient in observation at [facility] or [facility] if equivalent level of care is available:: Yes  Covid Evaluation: Asymptomatic - no  recent exposure (last 10 days) testing not required  Diagnosis: Lumbar vertebral fracture [facility]) [[id number]]  Admitting Physician: John Giovanni [[id number]]  Attending Physician: John Giovanni [[id number]]          Medical History Past Medical History:  Diagnosis Date   Acute exacerbation of CHF (congestive heart failure) (HCC) 05/04/2020   Acute respiratory failure with hypoxia (HCC) 05/08/2020   Anemia    Anxiety disorder 05/04/2020   Arthritis    Arthritis of knee 09/25/2013   Arthritis of knee, left 09/25/2013   Arthritis of right knee 02/10/2014   Back pain    Chronic kidney disease, stage 3a (HCC) 05/04/2020   Class 3 severe obesity with serious comorbidity and body mass index (BMI) of 45.0 to 49.9 in adult (HCC) 03/29/2018   Congestive heart failure (HCC) 06/01/2021   Depression    Elevated troponin 05/08/2020   Esophageal reflux 05/04/2020   Essential hypertension 05/08/2020   Fatigue    Gallbladder problem    H/O hiatal hernia    Hearing loss 06/01/2021   High cholesterol 05/04/2020   Hyperlipidemia 05/08/2020   Insomnia 06/01/2021   Insulin resistance 03/29/2018   Interscapular region contusion 06/01/2021   Joint pain    Lymphedema of both lower extremities    Morbid obesity with BMI of 50.0-59.9, adult (HCC) 05/08/2020   Muscle pain 06/01/2021   Obesity    Osteoarthritis    Other long term (current)  drug therapy 06/01/2021   Palpitations 11/24/2020   Perennial allergic rhinitis 01/10/2018   Pernicious anemia 06/01/2021   PONV (postoperative nausea and vomiting)    after Cholecysectomy   Prediabetes 06/01/2021   Primary osteoarthritis of left hip 11/26/2015   Primary osteoarthritis of right knee 02/09/2014   Recurrent major depression (HCC) 06/01/2021   Restless leg syndrome 05/08/2020   Rhinitis medicamentosa 01/10/2018   Sleep disturbance 06/01/2021   Systolic and diastolic CHF, acute (HCC) 05/08/2020   Tremor 06/01/2021   Urinary incontinence 06/01/2021   Vasomotor rhinitis 01/10/2018    Vertigo    Vitamin B 12 deficiency    Vitamin D deficiency 03/29/2018    Allergies Allergies  Allergen Reactions   No Known Allergies     IV Location/Drains/Wounds Patient Lines/Drains/Airways Status     Active Line/Drains/Airways     Name Placement date Placement time Site Days   Peripheral IV 06/21/22 20 G 2.5" Anterior;Left Forearm 06/21/22  1750  Forearm  1   Incision (Closed) 11/30/15 Hip Left 11/30/15  1200  -- 2396            Labs/Imaging Results for orders placed or performed during the hospital encounter of 06/21/22 (from the past 48 hour(s))  CBC with Differential     Status: Abnormal   Collection Time: 06/21/22  5:41 PM  Result Value Ref Range   WBC 10.1 4.0 - 10.5 K/uL   RBC 4.97 3.87 - 5.11 MIL/uL   Hemoglobin 15.0 12.0 - 15.0 g/dL   HCT 16.1 (H) 09.6 - 04.5 %   MCV 95.6 80.0 - 100.0 fL   MCH 30.2 26.0 - 34.0 pg   MCHC 31.6 30.0 - 36.0 g/dL   RDW 40.9 81.1 - 91.4 %   Platelets 208 150 - 400 K/uL   nRBC 0.0 0.0 - 0.2 %   Neutrophils Relative % 82 %   Neutro Abs 8.4 (H) 1.7 - 7.7 K/uL   Lymphocytes Relative 8 %   Lymphs Abs 0.8 0.7 - 4.0 K/uL   Monocytes Relative 6 %   Monocytes Absolute 0.6 0.1 - 1.0 K/uL   Eosinophils Relative 2 %   Eosinophils Absolute 0.2 0.0 - 0.5 K/uL   Basophils Relative 1 %   Basophils Absolute 0.1 0.0 - 0.1 K/uL   Immature Granulocytes 1 %   Abs Immature Granulocytes 0.07 0.00 - 0.07 K/uL    Comment: Performed at [facility], [address] [place], [place] [address]  Comprehensive metabolic panel     Status: Abnormal   Collection Time: 06/21/22  5:41 PM  Result Value Ref Range   Sodium 138 135 - 145 mmol/L   Potassium 4.6 3.5 - 5.1 mmol/L   Chloride 104 98 - 111 mmol/L   CO2 29 22 - 32 mmol/L   Glucose, Bld 95 70 - 99 mg/dL    Comment: Glucose reference range applies only to samples taken after fasting for at least 8 hours.   BUN 21 8 - 23 mg/dL   Creatinine, Ser 6.21 0.44 - 1.00 mg/dL   Calcium  8.7 (L) 8.9 - 10.3 mg/dL   Total Protein 6.6 6.5 - 8.1 g/dL   Albumin 3.5 3.5 - 5.0 g/dL   AST 26 15 - 41 U/L   ALT 25 0 - 44 U/L   Alkaline Phosphatase 142 (H) 38 - 126 U/L   Total Bilirubin 0.8 0.3 - 1.2 mg/dL   GFR, Estimated >30 >86 mL/min    Comment: (NOTE)  Calculated using the CKD-EPI Creatinine Equation (2021)    Anion gap 5 5 - 15    Comment: Performed at [facility], [address], [place], [place] [address]   MR LUMBAR SPINE WO CONTRAST  Result Date: 06/21/2022 CLINICAL DATA:  Ataxia, lumbar trauma.  Back pain. EXAM: MRI LUMBAR SPINE WITHOUT CONTRAST TECHNIQUE: Multiplanar, multisequence MR imaging of the lumbar spine was performed. No intravenous contrast was administered. COMPARISON:  CT examination performed earlier on the same date. FINDINGS: Segmentation:   5 non rib-bearing lumbar type vertebral bodies are present. The lowest fully formed vertebral body is L5. Alignment:  Physiologic. Vertebrae: Marrow edema of the L3 vertebral body with irregularity of the superior and inferior endplates and mild retropulsion of the vertebral body into the spinal canal suggesting acute fracture. Approximately 25% central vertebral body height loss. No evidence of discitis. Hemangioma in the L1 vertebral body. Conus medullaris and cauda equina: Conus extends to the L1 level. Conus and cauda equina appear normal. Paraspinal and other soft tissues: Negative. Disc levels: T12-L1: No significant disc bulge. No neural foraminal stenosis. No central canal stenosis. L1-L2: No significant disc bulge. No neural foraminal stenosis. No central canal stenosis. L2-L3: No significant disc bulge. No neural foraminal stenosis. No central canal stenosis. Mild bilateral facet joint arthropathy. L3-L4: No significant disc bulge. No neural foraminal stenosis. No central canal stenosis. Mild bilateral facet joint arthropathy. L4-L5: Broad-based disc bulge with moderate lateral recess stenosis  bilaterally. Ligamentum flavum hypertrophy with spinal canal stenosis. Mild bilateral facet joint arthropathy. No significant neural foraminal stenosis. L5-S1: Disc height loss and disc osteophyte complex with disc extrusion into bilateral lateral recesses. Moderate bilateral lateral recess stenosis. Moderate bilateral facet joint arthropathy. Mild right neural foraminal stenosis. IMPRESSION: 1. Marrow edema of the L3 vertebral body with irregularity of the superior and inferior endplates and mild retropulsion of the vertebral body into the spinal canal suggesting acute fracture. Approximately 25% central vertebral body height loss. 2. At L4-5 there is a broad-based disc bulge with moderate lateral recess stenosis bilaterally. Ligamentum flavum hypertrophy with spinal canal stenosis. Mild bilateral facet joint arthropathy. No significant neural foraminal stenosis. 3. At L5-S1 there is disc height loss and disc osteophyte complex with disc extrusion into bilateral lateral recesses. Moderate bilateral lateral recess stenosis. Moderate bilateral facet joint arthropathy. Mild right neural foraminal stenosis. Electronically Signed   By: Larose Hires D.O.   On: 06/21/2022 23:13   CT Lumbar Spine Wo Contrast  Result Date: 06/21/2022 CLINICAL DATA:  Ataxia, lumbar trauma EXAM: CT LUMBAR SPINE WITHOUT CONTRAST TECHNIQUE: Multidetector CT imaging of the lumbar spine was performed without intravenous contrast administration. Multiplanar CT image reconstructions were also generated. RADIATION DOSE REDUCTION: This exam was performed according to the departmental dose-optimization program which includes automated exposure control, adjustment of the mA and/or kV according to patient size and/or use of iterative reconstruction technique. COMPARISON:  MRI 10/12/2014 FINDINGS: Segmentation: 5 lumbar type vertebrae. Alignment: Normal. Vertebrae: There is an age indeterminate fracture of the L3 vertebral body involving both the  superior and inferior endplate as well as the posterior wall with 4 mm retropulsion of the posteroinferior aspect of the L3 vertebral body multiple fracture planes are visible anteriorly suggesting that this fracture may be acute to subacute in nature. The posterior elements appear intact and there is no facet subluxation. No other fracture identified. Remaining vertebral body height is preserved. Paraspinal and other soft tissues: There is mild paravertebral infiltration in keeping with trace  edema or hemorrhage. No loculated paraspinal fluid collection identified. Disc levels: There is intervertebral disc space narrowing and endplate remodeling, most severe at L4-S1 in keeping with changes of moderate to severe degenerative disc disease. Retropulsed fracture fragment at L3 abuts the thecal sac without significant remodeling. Multifactorial moderate to severe central canal stenosis at L3-4 and L4-5, not well characterized on this examination. Associated bilateral mild-to-moderate neuroforaminal narrowing at these levels, right greater than left. IMPRESSION: 1. Age-indeterminate fracture of the L3 vertebral body involving both the superior and inferior endplate as well as the posterior wall with 4 mm retropulsion of the posteroinferior aspect of the L3 vertebral body, suspected acute to subacute. The posterior elements appear intact and there is no facet subluxation. MRI examination would be more helpful for further evaluation. 2. Multifactorial moderate to severe central canal stenosis at L3-4 and L4-5, not well characterized on this examination. Associated bilateral mild-to-moderate neuroforaminal narrowing at these levels, right greater than left. These findings would also be better assessed with MRI examination. Electronically Signed   By: Helyn Numbers M.D.   On: 06/21/2022 20:08   CT HEAD WO CONTRAST ( )  Result Date: 06/21/2022 CLINICAL DATA:  Fall EXAM: CT HEAD WITHOUT CONTRAST CT CERVICAL SPINE  WITHOUT CONTRAST TECHNIQUE: Multidetector CT imaging of the head and cervical spine was performed following the standard protocol without intravenous contrast. Multiplanar CT image reconstructions of the cervical spine were also generated. RADIATION DOSE REDUCTION: This exam was performed according to the departmental dose-optimization program which includes automated exposure control, adjustment of the mA and/or kV according to patient size and/or use of iterative reconstruction technique. COMPARISON:  MR brain, 11/15/2020 CT chest 06/19/2003 FINDINGS: CT HEAD FINDINGS Brain: No evidence of acute infarction, hemorrhage, hydrocephalus, extra-axial collection or mass lesion/mass effect. Vascular: No hyperdense vessel or unexpected calcification. Skull: Hyperostosis frontalis. Negative for fracture or focal lesion. Sinuses/Orbits: No acute finding. Other: None. CT CERVICAL SPINE FINDINGS Alignment: Degenerative straightening of the normal cervical lordosis. Skull base and vertebrae: No acute fracture. No primary bone lesion or focal pathologic process. Soft tissues and spinal canal: No prevertebral fluid or swelling. No visible canal hematoma. Disc levels: Moderate to severe multilevel disc space height loss and osteophytosis, worst from C5-C7 Upper chest: Negative. Other: Hypodense left lobe thyroid nodule measuring 2.7 x 2.5 cm (series 11, image 79). IMPRESSION: 1. No acute intracranial pathology. 2. No fracture or static subluxation of the cervical spine. 3. Moderate to severe multilevel cervical disc degenerative disease. 4. Hypodense left lobe thyroid nodule measuring 2.7 x 2.5 cm. Recommend thyroid ultrasound for further evaluation if not already performed, on a nonemergent, outpatient basis (ref: J Am Coll Radiol. 2015 Feb;12(2): 143-50). Electronically Signed   By: Jearld Lesch M.D.   On: 06/21/2022 18:43   CT Cervical Spine Wo Contrast  Result Date: 06/21/2022 CLINICAL DATA:  Fall EXAM: CT HEAD WITHOUT  CONTRAST CT CERVICAL SPINE WITHOUT CONTRAST TECHNIQUE: Multidetector CT imaging of the head and cervical spine was performed following the standard protocol without intravenous contrast. Multiplanar CT image reconstructions of the cervical spine were also generated. RADIATION DOSE REDUCTION: This exam was performed according to the departmental dose-optimization program which includes automated exposure control, adjustment of the mA and/or kV according to patient size and/or use of iterative reconstruction technique. COMPARISON:  MR brain, 11/15/2020 CT chest 06/19/2003 FINDINGS: CT HEAD FINDINGS Brain: No evidence of acute infarction, hemorrhage, hydrocephalus, extra-axial collection or mass lesion/mass effect. Vascular: No hyperdense vessel or unexpected calcification. Skull: Hyperostosis frontalis.  Negative for fracture or focal lesion. Sinuses/Orbits: No acute finding. Other: None. CT CERVICAL SPINE FINDINGS Alignment: Degenerative straightening of the normal cervical lordosis. Skull base and vertebrae: No acute fracture. No primary bone lesion or focal pathologic process. Soft tissues and spinal canal: No prevertebral fluid or swelling. No visible canal hematoma. Disc levels: Moderate to severe multilevel disc space height loss and osteophytosis, worst from C5-C7 Upper chest: Negative. Other: Hypodense left lobe thyroid nodule measuring 2.7 x 2.5 cm (series 11, image 79). IMPRESSION: 1. No acute intracranial pathology. 2. No fracture or static subluxation of the cervical spine. 3. Moderate to severe multilevel cervical disc degenerative disease. 4. Hypodense left lobe thyroid nodule measuring 2.7 x 2.5 cm. Recommend thyroid ultrasound for further evaluation if not already performed, on a nonemergent, outpatient basis (ref: J Am Coll Radiol. 2015 Feb;12(2): 143-50). Electronically Signed   By: Jearld Lesch M.D.   On: 06/21/2022 18:43    Pending Labs Unresulted Labs (From admission, onward)    None        Vitals/Pain Today's Vitals   06/21/22 2010 06/21/22 2045 06/21/22 2117 06/22/22 0001  BP:  111/65    Pulse: 95 93    Resp:  14    Temp:   98.2 F (36.8 C)   TempSrc:   Oral   SpO2: 92% 97%    PainSc:    6     Isolation Precautions No active isolations  Medications Medications  HYDROmorphone (DILAUDID) injection 1 mg (1 mg Intravenous Given 06/21/22 1757)  ondansetron (ZOFRAN) injection 4 mg (4 mg Intravenous Given 06/21/22 1756)  HYDROmorphone (DILAUDID) injection 1 mg (1 mg Intravenous Given 06/21/22 1940)  diazepam (VALIUM) injection 5 mg (5 mg Intravenous Given 06/21/22 1941)  ketorolac (TORADOL) 30 MG/ML injection 30 mg (30 mg Intravenous Given 06/21/22 1940)  HYDROmorphone (DILAUDID) injection 1 mg (1 mg Intravenous Given 06/21/22 2217)    Mobility Bedrest

## 2022-06-22 NOTE — Progress Notes (Signed)
Chaplain's initial consult was to assist patient with advance directives.  Paige [name] stated that she already had all of her documents completed and notarized.  She did request prayer which chaplain provided.

## 2022-06-22 NOTE — Progress Notes (Signed)
[facility]                                                                              Paige [name], is a 77 y.o. 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  Vital signs stable on arrival.  Patient reported she was picking up packages in the front of her house and as she was walking back into the house, she lost her balance and fell backwards and injured her back.  No head injury.  MRI of the lumbar spine showed L3 vertebral fracture, acute, approximately 25% central vertebral body height loss.  L4-L5, there is broad-based disc bulge with moderate lateral recess stenosis bilaterally.  L5-S1, disc height loss and disc osteophyte complex with disc extrusion   Neurosurgery, Dr. Dutch Quint was consulted by EDP and recommended physical therapy and outpatient follow-up, no back brace.  Assessment & Plan    Principal Problem: Acute L3 lumbar vertebral fracture (HCC) status post mechanical fall -MRI of the lumbar spine as above with L3 vertebral fractures, 25% central vertebral body height loss.  L4-L5 with broad based disc bulge with moderate lateral recess stenosis bilaterally, disc height loss and disc osteophyte complex with disc extrusion at L5-S1 -Neurosurgery recommended conservative management with PT OT and pain control -On exam, patient in significant pain, 8/10, states barely able to move with excruciating pain on minimal movement -Continue IV pain medications, also placed on scheduled IV Robaxin, scheduled Tylenol, oxycodone as needed -Requested  IR consult for possible vertebroplasty -Lives alone, likely will need SNF   Active Problems: Chronic HFrEF due to nonischemic cardiomyopathy - Echo done in August 2022 showing EF 30 to 35%, G1 DD.   -Has chronic nonpitting pedal edema, currently no respiratory symptoms, chest x-ray clear.   -O2 desat in ED after IV opiates and was placed on 2 L [place], currently stable - Continue Jardiance, Entresto, and spironolactone.  -EKG reviewed/first-degree AV block, continue Coreg, low-dose 3.125 mg twice daily   Incidental thyroid nodule -CT showed hypodense left lobe thyroid nodule measuring 2.7 x 2.5 cm, recommended nonemergent thyroid ultrasound on an outpatient basis.   -TSH 2.9     History of PVCs Continue mexiletine.   Hypertension -Continue Entresto, Aldactone, resumed low-dose Coreg, heart rate 101-10 4 in the morning. First-degree AV block noted on the EKG, if worsens, will stop Coreg.   Hyperlipidemia Continue Lipitor.   Anxiety and depression Continue bupropion and duloxetine.   RLS Continue Mirapex.   Obesity Estimated body mass index is 52.77 kg/m as calculated from the following:   Height as of this encounter: 5\' 7"  (1.702 m).   Weight as of this encounter: 152.8 kg.  Code Status: Full code DVT Prophylaxis:  SCDs Start: 06/22/22 0306   Level of Care: Level of care: Telemetry Family Communication: Updated patient Disposition Plan:      Remains inpatient appropriate:      Procedures:  MRI lumbar spine  Consultants:   Interventional radiology Neurosurgery  Antimicrobials: None   Medications  acetaminophen  1,000 mg Oral TID   atorvastatin  40 mg Oral Daily   buPROPion  150 mg Oral q morning   docusate sodium  100 mg Oral BID   DULoxetine  60 mg Oral Daily   empagliflozin  25 mg Oral Daily   mexiletine  250 mg Oral BID   [START ON 06/23/2022] pneumococcal 20-valent conjugate vaccine  0.5 mL Intramuscular Tomorrow-1000   pramipexole  0.25 mg Oral Daily    pramipexole  0.5 mg Oral QHS   sacubitril-valsartan  1 tablet Oral BID   spironolactone  12.5 mg Oral Daily      Subjective:   Paige [name] was seen and examined today.  Complaining of low back pain 8/10, worsens on minimal exertion.  IV pain medications helping.  No dizziness, lightheadedness, chest pain or shortness of breath.  Currently no lower extremity weakness, numbness or tingling.   Objective:   Vitals:   06/22/22 0130 06/22/22 0134 06/22/22 0556 06/22/22 0739  BP: 118/60  (!) 121/55 (!) 112/56  Pulse: 86  92 98  Resp: 17  17   Temp: 97.8 F (36.6 C)  98.1 F (36.7 C) 98.4 F (36.9 C)  TempSrc: Oral  Oral Oral  SpO2: 100%  100% 98%  Weight:  (!) 152.8 kg    Height:  5\' 7"  (1.702 m)      Intake/Output Summary (Last 24 hours) at 06/22/2022 1146 Last data filed at 06/22/2022 0556 Gross per 24 hour  Intake --  Output 100 ml  Net -100 ml     Wt Readings from Last 3 Encounters:  06/22/22 (!) 152.8 kg  10/18/21 (!) 142.9 kg  05/11/21 (!) 143.7 kg     Exam General: Alert and oriented x 3, NAD Cardiovascular: S1 S2 auscultated,  RRR Respiratory: Clear to auscultation bilaterally Gastrointestinal: Soft, nontender, nondistended, + bowel sounds Ext: 1+  pedal edema bilaterally Neuro: Strength 5/5 upper and lower extremities bilaterally, worse back pain on lifting RLE Psych: Normal affect     Data Reviewed:  I have personally reviewed following labs    CBC Lab Results  Component Value Date   WBC 10.1 06/21/2022   RBC 4.97 06/21/2022   HGB 15.0 06/21/2022   HCT 47.5 (H) 06/21/2022   MCV 95.6 06/21/2022   MCH 30.2 06/21/2022   PLT 208 06/21/2022   MCHC 31.6 06/21/2022   RDW 14.1 06/21/2022   LYMPHSABS 0.8 06/21/2022   MONOABS 0.6 06/21/2022   EOSABS 0.2 06/21/2022   BASOSABS 0.1 06/21/2022     Last metabolic panel Lab Results  Component Value Date   NA 138 06/21/2022   K 4.6 06/21/2022   CL 104 06/21/2022   CO2 29 06/21/2022   BUN 21  06/21/2022   CREATININE 0.89 06/21/2022   GLUCOSE 95 06/21/2022   GFRNONAA >60 06/21/2022   GFRAA 61 02/28/2018   CALCIUM 8.7 (L) 06/21/2022   PHOS 3.7 05/08/2020   PROT 6.6 06/21/2022   ALBUMIN 3.5 06/21/2022   LABGLOB 1.9 02/28/2018   AGRATIO 2.4 (H) 02/28/2018   BILITOT 0.8 06/21/2022   ALKPHOS 142 (H) 06/21/2022   AST 26 06/21/2022   ALT  25 06/21/2022   ANIONGAP 5 06/21/2022    CBG (last 3)  No results for input(s): "GLUCAP" in the last 72 hours.    Coagulation Profile: No results for input(s): "INR", "PROTIME" in the last 168 hours.   Radiology Studies: I have personally reviewed the imaging studies  MR LUMBAR SPINE WO CONTRAST  Result Date: 06/21/2022 CLINICAL DATA:  Ataxia, lumbar trauma.  Back pain. EXAM: MRI LUMBAR SPINE WITHOUT CONTRAST TECHNIQUE: Multiplanar, multisequence MR imaging of the lumbar spine was performed. No intravenous contrast was administered. COMPARISON:  CT examination performed earlier on the same date. FINDINGS: Segmentation:   5 non rib-bearing lumbar type vertebral bodies are present. The lowest fully formed vertebral body is L5. Alignment:  Physiologic. Vertebrae: Marrow edema of the L3 vertebral body with irregularity of the superior and inferior endplates and mild retropulsion of the vertebral body into the spinal canal suggesting acute fracture. Approximately 25% central vertebral body height loss. No evidence of discitis. Hemangioma in the L1 vertebral body. Conus medullaris and cauda equina: Conus extends to the L1 level. Conus and cauda equina appear normal. Paraspinal and other soft tissues: Negative. Disc levels: T12-L1: No significant disc bulge. No neural foraminal stenosis. No central canal stenosis. L1-L2: No significant disc bulge. No neural foraminal stenosis. No central canal stenosis. L2-L3: No significant disc bulge. No neural foraminal stenosis. No central canal stenosis. Mild bilateral facet joint arthropathy. L3-L4: No significant  disc bulge. No neural foraminal stenosis. No central canal stenosis. Mild bilateral facet joint arthropathy. L4-L5: Broad-based disc bulge with moderate lateral recess stenosis bilaterally. Ligamentum flavum hypertrophy with spinal canal stenosis. Mild bilateral facet joint arthropathy. No significant neural foraminal stenosis. L5-S1: Disc height loss and disc osteophyte complex with disc extrusion into bilateral lateral recesses. Moderate bilateral lateral recess stenosis. Moderate bilateral facet joint arthropathy. Mild right neural foraminal stenosis. IMPRESSION: 1. Marrow edema of the L3 vertebral body with irregularity of the superior and inferior endplates and mild retropulsion of the vertebral body into the spinal canal suggesting acute fracture. Approximately 25% central vertebral body height loss. 2. At L4-5 there is a broad-based disc bulge with moderate lateral recess stenosis bilaterally. Ligamentum flavum hypertrophy with spinal canal stenosis. Mild bilateral facet joint arthropathy. No significant neural foraminal stenosis. 3. At L5-S1 there is disc height loss and disc osteophyte complex with disc extrusion into bilateral lateral recesses. Moderate bilateral lateral recess stenosis. Moderate bilateral facet joint arthropathy. Mild right neural foraminal stenosis. Electronically Signed   By: Larose Hires D.O.   On: 06/21/2022 23:13   CT Lumbar Spine Wo Contrast  Result Date: 06/21/2022 CLINICAL DATA:  Ataxia, lumbar trauma EXAM: CT LUMBAR SPINE WITHOUT CONTRAST TECHNIQUE: Multidetector CT imaging of the lumbar spine was performed without intravenous contrast administration. Multiplanar CT image reconstructions were also generated. RADIATION DOSE REDUCTION: This exam was performed according to the departmental dose-optimization program which includes automated exposure control, adjustment of the mA and/or kV according to patient size and/or use of iterative reconstruction technique. COMPARISON:  MRI  10/12/2014 FINDINGS: Segmentation: 5 lumbar type vertebrae. Alignment: Normal. Vertebrae: There is an age indeterminate fracture of the L3 vertebral body involving both the superior and inferior endplate as well as the posterior wall with 4 mm retropulsion of the posteroinferior aspect of the L3 vertebral body multiple fracture planes are visible anteriorly suggesting that this fracture may be acute to subacute in nature. The posterior elements appear intact and there is no facet subluxation. No other fracture identified. Remaining vertebral body  height is preserved. Paraspinal and other soft tissues: There is mild paravertebral infiltration in keeping with trace edema or hemorrhage. No loculated paraspinal fluid collection identified. Disc levels: There is intervertebral disc space narrowing and endplate remodeling, most severe at L4-S1 in keeping with changes of moderate to severe degenerative disc disease. Retropulsed fracture fragment at L3 abuts the thecal sac without significant remodeling. Multifactorial moderate to severe central canal stenosis at L3-4 and L4-5, not well characterized on this examination. Associated bilateral mild-to-moderate neuroforaminal narrowing at these levels, right greater than left. IMPRESSION: 1. Age-indeterminate fracture of the L3 vertebral body involving both the superior and inferior endplate as well as the posterior wall with 4 mm retropulsion of the posteroinferior aspect of the L3 vertebral body, suspected acute to subacute. The posterior elements appear intact and there is no facet subluxation. MRI examination would be more helpful for further evaluation. 2. Multifactorial moderate to severe central canal stenosis at L3-4 and L4-5, not well characterized on this examination. Associated bilateral mild-to-moderate neuroforaminal narrowing at these levels, right greater than left. These findings would also be better assessed with MRI examination. Electronically Signed   By:  Helyn Numbers M.D.   On: 06/21/2022 20:08   CT HEAD WO CONTRAST ( )  Result Date: 06/21/2022 CLINICAL DATA:  Fall EXAM: CT HEAD WITHOUT CONTRAST CT CERVICAL SPINE WITHOUT CONTRAST TECHNIQUE: Multidetector CT imaging of the head and cervical spine was performed following the standard protocol without intravenous contrast. Multiplanar CT image reconstructions of the cervical spine were also generated. RADIATION DOSE REDUCTION: This exam was performed according to the departmental dose-optimization program which includes automated exposure control, adjustment of the mA and/or kV according to patient size and/or use of iterative reconstruction technique. COMPARISON:  MR brain, 11/15/2020 CT chest 06/19/2003 FINDINGS: CT HEAD FINDINGS Brain: No evidence of acute infarction, hemorrhage, hydrocephalus, extra-axial collection or mass lesion/mass effect. Vascular: No hyperdense vessel or unexpected calcification. Skull: Hyperostosis frontalis. Negative for fracture or focal lesion. Sinuses/Orbits: No acute finding. Other: None. CT CERVICAL SPINE FINDINGS Alignment: Degenerative straightening of the normal cervical lordosis. Skull base and vertebrae: No acute fracture. No primary bone lesion or focal pathologic process. Soft tissues and spinal canal: No prevertebral fluid or swelling. No visible canal hematoma. Disc levels: Moderate to severe multilevel disc space height loss and osteophytosis, worst from C5-C7 Upper chest: Negative. Other: Hypodense left lobe thyroid nodule measuring 2.7 x 2.5 cm (series 11, image 79). IMPRESSION: 1. No acute intracranial pathology. 2. No fracture or static subluxation of the cervical spine. 3. Moderate to severe multilevel cervical disc degenerative disease. 4. Hypodense left lobe thyroid nodule measuring 2.7 x 2.5 cm. Recommend thyroid ultrasound for further evaluation if not already performed, on a nonemergent, outpatient basis (ref: J Am Coll Radiol. 2015 Feb;12(2): 143-50).  Electronically Signed   By: Jearld Lesch M.D.   On: 06/21/2022 18:43   CT Cervical Spine Wo Contrast  Result Date: 06/21/2022 CLINICAL DATA:  Fall EXAM: CT HEAD WITHOUT CONTRAST CT CERVICAL SPINE WITHOUT CONTRAST TECHNIQUE: Multidetector CT imaging of the head and cervical spine was performed following the standard protocol without intravenous contrast. Multiplanar CT image reconstructions of the cervical spine were also generated. RADIATION DOSE REDUCTION: This exam was performed according to the departmental dose-optimization program which includes automated exposure control, adjustment of the mA and/or kV according to patient size and/or use of iterative reconstruction technique. COMPARISON:  MR brain, 11/15/2020 CT chest 06/19/2003 FINDINGS: CT HEAD FINDINGS Brain: No evidence of acute infarction, hemorrhage,  hydrocephalus, extra-axial collection or mass lesion/mass effect. Vascular: No hyperdense vessel or unexpected calcification. Skull: Hyperostosis frontalis. Negative for fracture or focal lesion. Sinuses/Orbits: No acute finding. Other: None. CT CERVICAL SPINE FINDINGS Alignment: Degenerative straightening of the normal cervical lordosis. Skull base and vertebrae: No acute fracture. No primary bone lesion or focal pathologic process. Soft tissues and spinal canal: No prevertebral fluid or swelling. No visible canal hematoma. Disc levels: Moderate to severe multilevel disc space height loss and osteophytosis, worst from C5-C7 Upper chest: Negative. Other: Hypodense left lobe thyroid nodule measuring 2.7 x 2.5 cm (series 11, image 79). IMPRESSION: 1. No acute intracranial pathology. 2. No fracture or static subluxation of the cervical spine. 3. Moderate to severe multilevel cervical disc degenerative disease. 4. Hypodense left lobe thyroid nodule measuring 2.7 x 2.5 cm. Recommend thyroid ultrasound for further evaluation if not already performed, on a nonemergent, outpatient basis (ref: J Am Coll Radiol.  2015 Feb;12(2): 143-50). Electronically Signed   By: Jearld Lesch M.D.   On: 06/21/2022 18:43       Jerik Falletta M.D. [facility] 06/22/2022, 11:46 AM  Available via Epic secure chat 7am-7pm After 7 pm, please refer to night coverage provider listed on amion.

## 2022-06-23 DIAGNOSIS — G2581 Restless legs syndrome: Secondary | ICD-10-CM | POA: Diagnosis present

## 2022-06-23 DIAGNOSIS — F064 Anxiety disorder due to known physiological condition: Secondary | ICD-10-CM | POA: Diagnosis not present

## 2022-06-23 DIAGNOSIS — M5126 Other intervertebral disc displacement, lumbar region: Secondary | ICD-10-CM | POA: Diagnosis present

## 2022-06-23 DIAGNOSIS — E559 Vitamin D deficiency, unspecified: Secondary | ICD-10-CM | POA: Diagnosis present

## 2022-06-23 DIAGNOSIS — W19XXXD Unspecified fall, subsequent encounter: Secondary | ICD-10-CM | POA: Diagnosis not present

## 2022-06-23 DIAGNOSIS — D649 Anemia, unspecified: Secondary | ICD-10-CM | POA: Diagnosis not present

## 2022-06-23 DIAGNOSIS — Z7984 Long term (current) use of oral hypoglycemic drugs: Secondary | ICD-10-CM | POA: Diagnosis not present

## 2022-06-23 DIAGNOSIS — Z23 Encounter for immunization: Secondary | ICD-10-CM | POA: Diagnosis present

## 2022-06-23 DIAGNOSIS — E785 Hyperlipidemia, unspecified: Secondary | ICD-10-CM | POA: Diagnosis not present

## 2022-06-23 DIAGNOSIS — R7303 Prediabetes: Secondary | ICD-10-CM | POA: Diagnosis not present

## 2022-06-23 DIAGNOSIS — I10 Essential (primary) hypertension: Secondary | ICD-10-CM | POA: Diagnosis not present

## 2022-06-23 DIAGNOSIS — I440 Atrioventricular block, first degree: Secondary | ICD-10-CM | POA: Diagnosis present

## 2022-06-23 DIAGNOSIS — Y92018 Other place in single-family (private) house as the place of occurrence of the external cause: Secondary | ICD-10-CM | POA: Diagnosis not present

## 2022-06-23 DIAGNOSIS — F419 Anxiety disorder, unspecified: Secondary | ICD-10-CM | POA: Diagnosis not present

## 2022-06-23 DIAGNOSIS — S32039A Unspecified fracture of third lumbar vertebra, initial encounter for closed fracture: Secondary | ICD-10-CM | POA: Diagnosis not present

## 2022-06-23 DIAGNOSIS — Z96653 Presence of artificial knee joint, bilateral: Secondary | ICD-10-CM | POA: Diagnosis present

## 2022-06-23 DIAGNOSIS — Y9301 Activity, walking, marching and hiking: Secondary | ICD-10-CM | POA: Diagnosis present

## 2022-06-23 DIAGNOSIS — I428 Other cardiomyopathies: Secondary | ICD-10-CM | POA: Diagnosis not present

## 2022-06-23 DIAGNOSIS — Z8673 Personal history of transient ischemic attack (TIA), and cerebral infarction without residual deficits: Secondary | ICD-10-CM | POA: Diagnosis not present

## 2022-06-23 DIAGNOSIS — N1831 Chronic kidney disease, stage 3a: Secondary | ICD-10-CM | POA: Diagnosis not present

## 2022-06-23 DIAGNOSIS — M1990 Unspecified osteoarthritis, unspecified site: Secondary | ICD-10-CM | POA: Diagnosis not present

## 2022-06-23 DIAGNOSIS — S32030A Wedge compression fracture of third lumbar vertebra, initial encounter for closed fracture: Secondary | ICD-10-CM | POA: Diagnosis present

## 2022-06-23 DIAGNOSIS — M2578 Osteophyte, vertebrae: Secondary | ICD-10-CM | POA: Diagnosis present

## 2022-06-23 DIAGNOSIS — I5022 Chronic systolic (congestive) heart failure: Secondary | ICD-10-CM | POA: Diagnosis not present

## 2022-06-23 DIAGNOSIS — E7800 Pure hypercholesterolemia, unspecified: Secondary | ICD-10-CM

## 2022-06-23 DIAGNOSIS — S32039D Unspecified fracture of third lumbar vertebra, subsequent encounter for fracture with routine healing: Secondary | ICD-10-CM | POA: Diagnosis not present

## 2022-06-23 DIAGNOSIS — W010XXA Fall on same level from slipping, tripping and stumbling without subsequent striking against object, initial encounter: Secondary | ICD-10-CM | POA: Diagnosis present

## 2022-06-23 DIAGNOSIS — M549 Dorsalgia, unspecified: Secondary | ICD-10-CM | POA: Diagnosis present

## 2022-06-23 DIAGNOSIS — E538 Deficiency of other specified B group vitamins: Secondary | ICD-10-CM | POA: Diagnosis present

## 2022-06-23 DIAGNOSIS — I130 Hypertensive heart and chronic kidney disease with heart failure and stage 1 through stage 4 chronic kidney disease, or unspecified chronic kidney disease: Secondary | ICD-10-CM | POA: Diagnosis not present

## 2022-06-23 DIAGNOSIS — Z66 Do not resuscitate: Secondary | ICD-10-CM | POA: Diagnosis present

## 2022-06-23 DIAGNOSIS — I493 Ventricular premature depolarization: Secondary | ICD-10-CM | POA: Diagnosis not present

## 2022-06-23 DIAGNOSIS — Z8249 Family history of ischemic heart disease and other diseases of the circulatory system: Secondary | ICD-10-CM | POA: Diagnosis not present

## 2022-06-23 DIAGNOSIS — Z6841 Body Mass Index (BMI) 40.0 and over, adult: Secondary | ICD-10-CM | POA: Diagnosis not present

## 2022-06-23 DIAGNOSIS — I5042 Chronic combined systolic (congestive) and diastolic (congestive) heart failure: Secondary | ICD-10-CM | POA: Diagnosis present

## 2022-06-23 DIAGNOSIS — E041 Nontoxic single thyroid nodule: Secondary | ICD-10-CM | POA: Diagnosis not present

## 2022-06-23 DIAGNOSIS — K219 Gastro-esophageal reflux disease without esophagitis: Secondary | ICD-10-CM | POA: Diagnosis present

## 2022-06-23 DIAGNOSIS — F32A Depression, unspecified: Secondary | ICD-10-CM | POA: Diagnosis not present

## 2022-06-23 LAB — CBC
HCT: 48.6 % — ABNORMAL HIGH (ref 36.0–46.0)
Hemoglobin: 15.3 g/dL — ABNORMAL HIGH (ref 12.0–15.0)
MCH: 30.7 pg (ref 26.0–34.0)
MCHC: 31.5 g/dL (ref 30.0–36.0)
MCV: 97.4 fL (ref 80.0–100.0)
Platelets: 162 10*3/uL (ref 150–400)
RBC: 4.99 MIL/uL (ref 3.87–5.11)
RDW: 14.3 % (ref 11.5–15.5)
WBC: 9.3 10*3/uL (ref 4.0–10.5)
nRBC: 0 % (ref 0.0–0.2)

## 2022-06-23 LAB — PROTIME-INR
INR: 1.1 (ref 0.8–1.2)
Prothrombin Time: 14 seconds (ref 11.4–15.2)

## 2022-06-23 LAB — BASIC METABOLIC PANEL
Anion gap: 7 (ref 5–15)
BUN: 16 mg/dL (ref 8–23)
CO2: 28 mmol/L (ref 22–32)
Calcium: 8.5 mg/dL — ABNORMAL LOW (ref 8.9–10.3)
Chloride: 100 mmol/L (ref 98–111)
Creatinine, Ser: 0.95 mg/dL (ref 0.44–1.00)
GFR, Estimated: >60 mL/min (ref >60)
Glucose, Bld: 121 mg/dL — ABNORMAL HIGH (ref 70–99)
Potassium: 4.6 mmol/L (ref 3.5–5.1)
Sodium: 135 mmol/L (ref 135–145)

## 2022-06-23 MED ORDER — ZOLPIDEM TARTRATE 5 MG PO TABS
5.0000 mg | ORAL_TABLET | Freq: Every evening | ORAL | Status: AC | PRN
  Administered 2022-06-23 – 2022-06-28 (×3): 5 mg via ORAL
  Filled 2022-06-23 (×3): qty 1

## 2022-06-23 NOTE — TOC Initial Note (Signed)
Transition of Care [facility]) - Initial/Assessment Note    Patient Details  Name: Paige [name] MRN: [id number] Date of Birth: 1945/10/04  [facility] CM/SW Contact:    Larrie Kass, LCSW Phone Number: 06/23/2022, 1:14 PM  Clinical Narrative:                 CSW met with pt regarding SNF recommendations. Pt agreed  to placement. Pt reports she would like [facility] as she was there before. CSW explained the process , pt will need insurance auth. CSW to work pt up for SNF placement. TOC to follow.   Expected Discharge Plan: Skilled Nursing Facility Barriers to Discharge: Continued Medical Work up   Patient Goals and CMS Choice Patient states their goals for this hospitalization and ongoing recovery are:: get stronger          Expected Discharge Plan and Services       Living arrangements for the past 2 months: Single Family Home                                      Prior Living Arrangements/Services Living arrangements for the past 2 months: Single Family Home Lives with:: Self Patient language and need for interpreter reviewed:: Yes Do you feel safe going back to the place where you live?: Yes      Need for Family Participation in Patient Care: No (Comment) Care giver support system in place?: No (comment) Current home services: DME Criminal Activity/Legal Involvement Pertinent to Current Situation/Hospitalization: No - Comment as needed  Activities of Daily Living Home Assistive Devices/Equipment: Dan Humphreys (specify type) ADL Screening (condition at time of admission) Patient's cognitive ability adequate to safely complete daily activities?: Yes Is the patient deaf or have difficulty hearing?: No Does the patient have difficulty seeing, even when wearing glasses/contacts?: No Does the patient have difficulty concentrating, remembering, or making decisions?: No Patient able to express need for assistance with ADLs?: Yes Does the patient  have difficulty dressing or bathing?: No Independently performs ADLs?: Yes (appropriate for developmental age) Does the patient have difficulty walking or climbing stairs?: Yes Weakness of Legs: Both Weakness of Arms/Hands: None  Permission Sought/Granted                  Emotional Assessment Appearance:: Appears stated age Attitude/Demeanor/Rapport: Gracious Affect (typically observed): Accepting Orientation: : Oriented to Self, Oriented to Place, Oriented to Situation, Oriented to  Time   Psych Involvement: No (comment)  Admission diagnosis:  Lumbar vertebral fracture (HCC) [S32.009A] Closed fracture of third lumbar vertebra, unspecified fracture morphology, initial encounter (HCC) [S32.039A] Patient Active Problem List   Diagnosis Date Noted   Lumbar vertebral fracture (HCC) 06/22/2022   Thyroid nodule 06/22/2022   First degree AV block 06/22/2022   Chronic HFrEF (heart failure with reduced ejection fraction) (HCC) 06/22/2022   PVC's (premature ventricular contractions) 06/22/2022   Congestive heart failure (HCC) 06/01/2021   Hearing loss 06/01/2021   Insomnia 06/01/2021   Interscapular region contusion 06/01/2021   Other long term (current) drug therapy 06/01/2021   Pernicious anemia 06/01/2021   Prediabetes 06/01/2021   Recurrent major depression (HCC) 06/01/2021   Sleep disturbance 06/01/2021   Tremor 06/01/2021   Urinary incontinence 06/01/2021   Muscle pain 06/01/2021   Palpitations 11/24/2020   Anemia 08/07/2020   Arthritis 08/07/2020   Back pain 08/07/2020   Fatigue 08/07/2020   Gallbladder problem  08/07/2020   H/O hiatal hernia 08/07/2020   Joint pain 08/07/2020   Lymphedema of both lower extremities 08/07/2020   Obesity 08/07/2020   Osteoarthritis 08/07/2020   PONV (postoperative nausea and vomiting) 08/07/2020   Vertigo 08/07/2020   Vitamin B 12 deficiency 08/07/2020   Acute respiratory failure with hypoxia (HCC) 05/08/2020   Systolic and  diastolic CHF, acute (HCC) 05/08/2020   Elevated troponin 05/08/2020   Essential hypertension 05/08/2020   Hyperlipidemia 05/08/2020   Restless leg syndrome 05/08/2020   Morbid obesity with BMI of 50.0-59.9, adult (HCC) 05/08/2020   Anxiety disorder 05/04/2020   Chronic kidney disease, stage 3a (HCC) 05/04/2020   Esophageal reflux 05/04/2020   High cholesterol 05/04/2020   Acute exacerbation of CHF (congestive heart failure) (HCC) 05/04/2020   Depression 04/26/2018   Vitamin D deficiency 03/29/2018   Insulin resistance 03/29/2018   Class 3 severe obesity with serious comorbidity and body mass index (BMI) of 45.0 to 49.9 in adult [facility]) 03/29/2018   Perennial allergic rhinitis 01/10/2018   Rhinitis medicamentosa 01/10/2018   Vasomotor rhinitis 01/10/2018   Primary osteoarthritis of left hip 11/26/2015   Arthritis of right knee 02/10/2014   Primary osteoarthritis of right knee 02/09/2014   Arthritis of knee, left 09/25/2013   Arthritis of knee 09/25/2013   PCP:  [name], MD Pharmacy:   [facility] [phone] [place], [place] - [address] AT [address] [place] [address] Phone: [phone] Fax: [phone]     Social Determinants of Health (SDOH) Social History: SDOH Screenings   Food Insecurity: No Food Insecurity (06/22/2022)  Housing: Low Risk  (06/22/2022)  Transportation Needs: No Transportation Needs (06/22/2022)  Utilities: Not At Risk (06/22/2022)  Depression (PHQ2-9): Low Risk  (05/23/2018)  Financial Resource Strain: Low Risk  (07/01/2020)  Tobacco Use: Low Risk  (06/21/2022)   SDOH Interventions:     Readmission Risk Interventions     No data to display

## 2022-06-23 NOTE — Progress Notes (Signed)
[facility]                                                                              Paige [name], is a 77 y.o. 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  Vital signs stable on arrival.  Patient reported she was picking up packages in the front of her house and as she was walking back into the house, she lost her balance and fell backwards and injured her back.  No head injury.  MRI of the lumbar spine showed L3 vertebral fracture, acute, approximately 25% central vertebral body height loss.  L4-L5, there is broad-based disc bulge with moderate lateral recess stenosis bilaterally.  L5-S1, disc height loss and disc osteophyte complex with disc extrusion   Neurosurgery, Dr. Dutch Quint was consulted by EDP and recommended physical therapy and outpatient follow-up, no back brace.  Assessment & Plan    Principal Problem: Acute L3 lumbar vertebral fracture (HCC) status post mechanical fall -MRI of the lumbar spine as above with L3 vertebral fractures, 25% central vertebral body height loss.  L4-L5 with broad based disc bulge with moderate lateral recess stenosis bilaterally, disc height loss and disc osteophyte complex with disc extrusion at L5-S1 -Neurosurgery recommended conservative management with PT OT and pain control -Still in significant pain, on minimal movement  -Continue IV pain medications, scheduled IV Robaxin, scheduled Tylenol, oxycodone as needed -Appreciate IR consult for possible vertebroplasty, patient is agreeable -Lives alone,  likely will need SNF   Active Problems: Chronic HFrEF due to nonischemic cardiomyopathy - Echo done in August 2022 showing EF 30 to 35%, G1 DD.   -Has chronic nonpitting pedal edema, currently no respiratory symptoms, chest x-ray clear.   - Continue Jardiance, Entresto, and spironolactone.  -EKG reviewed/first-degree AV block, continue Coreg, low-dose 3.125 mg twice daily -O2 sats 94% on room air   Incidental thyroid nodule -CT showed hypodense left lobe thyroid nodule measuring 2.7 x 2.5 cm, recommended nonemergent thyroid ultrasound on an outpatient basis.   -TSH 2.9     History of PVCs Continue mexiletine.   Hypertension -Continue Entresto, Aldactone, resumed low-dose Coreg, heart rate 101-10 4 in the morning. First-degree AV block noted on the EKG, if worsens, will stop Coreg.   Hyperlipidemia Continue Lipitor.   Anxiety and depression Continue bupropion and duloxetine.   RLS Continue Mirapex.   Obesity Estimated body mass index is 52.77 kg/m as calculated from the following:   Height as of this encounter: 5\' 7"  (1.702 m).   Weight as of this encounter: 152.8 kg.  Code Status: Full code DVT Prophylaxis:  SCDs Start: 06/22/22 0306   Level of Care: Level of care: Telemetry  Family Communication: Updated patient Disposition Plan:      Remains inpatient appropriate: Awaiting kyphoplasty, patient will need SNF   Procedures:  MRI lumbar spine  Consultants:   Interventional radiology Neurosurgery  Antimicrobials: None   Medications  acetaminophen  1,000 mg Oral TID   atorvastatin  40 mg Oral Daily   buPROPion  150 mg Oral q morning   carvedilol  3.125 mg Oral BID WC   docusate sodium  100 mg Oral BID   DULoxetine  60 mg Oral Daily   empagliflozin  25 mg Oral Daily   mexiletine  250 mg Oral BID   pramipexole  0.25 mg Oral Daily   pramipexole  0.5 mg Oral QHS   sacubitril-valsartan  1 tablet Oral BID   spironolactone  12.5 mg Oral Daily       Subjective:   Paige [name] was seen and examined today.  Complaining of excruciating pain on minimal movement, today shoulder also hurting due to stiffness.  No dizziness, lightheadedness, chest pain or shortness of breath.   Objective:   Vitals:   06/22/22 1208 06/22/22 1615 06/22/22 2101 06/23/22 0534  BP: 125/73 (!) 120/53 127/66 115/73  Pulse: 95 84 93 97  Resp: 18  17 17   Temp: 97.6 F (36.4 C)  98.6 F (37 C) 98 F (36.7 C)  TempSrc: Oral  Oral Oral  SpO2: 94%   94%  Weight:      Height:        Intake/Output Summary (Last 24 hours) at 06/23/2022 1152 Last data filed at 06/23/2022 0856 Gross per 24 hour  Intake 715 ml  Output 3200 ml  Net -2485 ml     Wt Readings from Last 3 Encounters:  06/22/22 (!) 152.8 kg  10/18/21 (!) 142.9 kg  05/11/21 (!) 143.7 kg   Physical Exam General: Alert and oriented x 3, NAD, uncomfortable Cardiovascular: S1 S2 clear, RRR.  Respiratory: CTAB, no wheezing Gastrointestinal: Soft, nontender, nondistended, NBS Ext: no pedal edema bilaterally Neuro: no new deficits Psych: Normal affect    Data Reviewed:  I have personally reviewed following labs    CBC Lab Results  Component Value Date   WBC 9.3 06/23/2022   RBC 4.99 06/23/2022   HGB 15.3 (H) 06/23/2022   HCT 48.6 (H) 06/23/2022   MCV 97.4 06/23/2022   MCH 30.7 06/23/2022   PLT 162 06/23/2022   MCHC 31.5 06/23/2022   RDW 14.3 06/23/2022   LYMPHSABS 0.8 06/21/2022   MONOABS 0.6 06/21/2022   EOSABS 0.2 06/21/2022   BASOSABS 0.1 06/21/2022     Last metabolic panel Lab Results  Component Value Date   NA 135 06/23/2022   K 4.6 06/23/2022   CL 100 06/23/2022   CO2 28 06/23/2022   BUN 16 06/23/2022   CREATININE 0.95 06/23/2022   GLUCOSE 121 (H) 06/23/2022   GFRNONAA >60 06/23/2022   GFRAA 61 02/28/2018   CALCIUM 8.5 (L) 06/23/2022   PHOS 3.7 05/08/2020   PROT 6.6 06/21/2022   ALBUMIN 3.5 06/21/2022   LABGLOB 1.9 02/28/2018   AGRATIO 2.4 (H)  02/28/2018   BILITOT 0.8 06/21/2022   ALKPHOS 142 (H) 06/21/2022   AST 26 06/21/2022   ALT 25 06/21/2022   ANIONGAP 7 06/23/2022    CBG (last 3)  No results for input(s): "GLUCAP" in the last 72 hours.    Coagulation Profile: Recent Labs  Lab 06/23/22 0750  INR 1.1     Radiology Studies: I have personally reviewed the imaging  studies  MR LUMBAR SPINE WO CONTRAST  Result Date: 06/21/2022 CLINICAL DATA:  Ataxia, lumbar trauma.  Back pain. EXAM: MRI LUMBAR SPINE WITHOUT CONTRAST TECHNIQUE: Multiplanar, multisequence MR imaging of the lumbar spine was performed. No intravenous contrast was administered. COMPARISON:  CT examination performed earlier on the same date. FINDINGS: Segmentation:   5 non rib-bearing lumbar type vertebral bodies are present. The lowest fully formed vertebral body is L5. Alignment:  Physiologic. Vertebrae: Marrow edema of the L3 vertebral body with irregularity of the superior and inferior endplates and mild retropulsion of the vertebral body into the spinal canal suggesting acute fracture. Approximately 25% central vertebral body height loss. No evidence of discitis. Hemangioma in the L1 vertebral body. Conus medullaris and cauda equina: Conus extends to the L1 level. Conus and cauda equina appear normal. Paraspinal and other soft tissues: Negative. Disc levels: T12-L1: No significant disc bulge. No neural foraminal stenosis. No central canal stenosis. L1-L2: No significant disc bulge. No neural foraminal stenosis. No central canal stenosis. L2-L3: No significant disc bulge. No neural foraminal stenosis. No central canal stenosis. Mild bilateral facet joint arthropathy. L3-L4: No significant disc bulge. No neural foraminal stenosis. No central canal stenosis. Mild bilateral facet joint arthropathy. L4-L5: Broad-based disc bulge with moderate lateral recess stenosis bilaterally. Ligamentum flavum hypertrophy with spinal canal stenosis. Mild bilateral facet joint  arthropathy. No significant neural foraminal stenosis. L5-S1: Disc height loss and disc osteophyte complex with disc extrusion into bilateral lateral recesses. Moderate bilateral lateral recess stenosis. Moderate bilateral facet joint arthropathy. Mild right neural foraminal stenosis. IMPRESSION: 1. Marrow edema of the L3 vertebral body with irregularity of the superior and inferior endplates and mild retropulsion of the vertebral body into the spinal canal suggesting acute fracture. Approximately 25% central vertebral body height loss. 2. At L4-5 there is a broad-based disc bulge with moderate lateral recess stenosis bilaterally. Ligamentum flavum hypertrophy with spinal canal stenosis. Mild bilateral facet joint arthropathy. No significant neural foraminal stenosis. 3. At L5-S1 there is disc height loss and disc osteophyte complex with disc extrusion into bilateral lateral recesses. Moderate bilateral lateral recess stenosis. Moderate bilateral facet joint arthropathy. Mild right neural foraminal stenosis. Electronically Signed   By: Larose Hires D.O.   On: 06/21/2022 23:13   CT Lumbar Spine Wo Contrast  Result Date: 06/21/2022 CLINICAL DATA:  Ataxia, lumbar trauma EXAM: CT LUMBAR SPINE WITHOUT CONTRAST TECHNIQUE: Multidetector CT imaging of the lumbar spine was performed without intravenous contrast administration. Multiplanar CT image reconstructions were also generated. RADIATION DOSE REDUCTION: This exam was performed according to the departmental dose-optimization program which includes automated exposure control, adjustment of the mA and/or kV according to patient size and/or use of iterative reconstruction technique. COMPARISON:  MRI 10/12/2014 FINDINGS: Segmentation: 5 lumbar type vertebrae. Alignment: Normal. Vertebrae: There is an age indeterminate fracture of the L3 vertebral body involving both the superior and inferior endplate as well as the posterior wall with 4 mm retropulsion of the  posteroinferior aspect of the L3 vertebral body multiple fracture planes are visible anteriorly suggesting that this fracture may be acute to subacute in nature. The posterior elements appear intact and there is no facet subluxation. No other fracture identified. Remaining vertebral body height is preserved. Paraspinal and other soft tissues: There is mild paravertebral infiltration in keeping with trace edema or hemorrhage. No loculated paraspinal fluid collection identified. Disc levels: There is intervertebral disc space narrowing and endplate remodeling, most severe at L4-S1 in keeping with changes of moderate to severe degenerative  disc disease. Retropulsed fracture fragment at L3 abuts the thecal sac without significant remodeling. Multifactorial moderate to severe central canal stenosis at L3-4 and L4-5, not well characterized on this examination. Associated bilateral mild-to-moderate neuroforaminal narrowing at these levels, right greater than left. IMPRESSION: 1. Age-indeterminate fracture of the L3 vertebral body involving both the superior and inferior endplate as well as the posterior wall with 4 mm retropulsion of the posteroinferior aspect of the L3 vertebral body, suspected acute to subacute. The posterior elements appear intact and there is no facet subluxation. MRI examination would be more helpful for further evaluation. 2. Multifactorial moderate to severe central canal stenosis at L3-4 and L4-5, not well characterized on this examination. Associated bilateral mild-to-moderate neuroforaminal narrowing at these levels, right greater than left. These findings would also be better assessed with MRI examination. Electronically Signed   By: Helyn Numbers M.D.   On: 06/21/2022 20:08   CT HEAD WO CONTRAST ( )  Result Date: 06/21/2022 CLINICAL DATA:  Fall EXAM: CT HEAD WITHOUT CONTRAST CT CERVICAL SPINE WITHOUT CONTRAST TECHNIQUE: Multidetector CT imaging of the head and cervical spine was  performed following the standard protocol without intravenous contrast. Multiplanar CT image reconstructions of the cervical spine were also generated. RADIATION DOSE REDUCTION: This exam was performed according to the departmental dose-optimization program which includes automated exposure control, adjustment of the mA and/or kV according to patient size and/or use of iterative reconstruction technique. COMPARISON:  MR brain, 11/15/2020 CT chest 06/19/2003 FINDINGS: CT HEAD FINDINGS Brain: No evidence of acute infarction, hemorrhage, hydrocephalus, extra-axial collection or mass lesion/mass effect. Vascular: No hyperdense vessel or unexpected calcification. Skull: Hyperostosis frontalis. Negative for fracture or focal lesion. Sinuses/Orbits: No acute finding. Other: None. CT CERVICAL SPINE FINDINGS Alignment: Degenerative straightening of the normal cervical lordosis. Skull base and vertebrae: No acute fracture. No primary bone lesion or focal pathologic process. Soft tissues and spinal canal: No prevertebral fluid or swelling. No visible canal hematoma. Disc levels: Moderate to severe multilevel disc space height loss and osteophytosis, worst from C5-C7 Upper chest: Negative. Other: Hypodense left lobe thyroid nodule measuring 2.7 x 2.5 cm (series 11, image 79). IMPRESSION: 1. No acute intracranial pathology. 2. No fracture or static subluxation of the cervical spine. 3. Moderate to severe multilevel cervical disc degenerative disease. 4. Hypodense left lobe thyroid nodule measuring 2.7 x 2.5 cm. Recommend thyroid ultrasound for further evaluation if not already performed, on a nonemergent, outpatient basis (ref: J Am Coll Radiol. 2015 Feb;12(2): 143-50). Electronically Signed   By: Jearld Lesch M.D.   On: 06/21/2022 18:43   CT Cervical Spine Wo Contrast  Result Date: 06/21/2022 CLINICAL DATA:  Fall EXAM: CT HEAD WITHOUT CONTRAST CT CERVICAL SPINE WITHOUT CONTRAST TECHNIQUE: Multidetector CT imaging of the  head and cervical spine was performed following the standard protocol without intravenous contrast. Multiplanar CT image reconstructions of the cervical spine were also generated. RADIATION DOSE REDUCTION: This exam was performed according to the departmental dose-optimization program which includes automated exposure control, adjustment of the mA and/or kV according to patient size and/or use of iterative reconstruction technique. COMPARISON:  MR brain, 11/15/2020 CT chest 06/19/2003 FINDINGS: CT HEAD FINDINGS Brain: No evidence of acute infarction, hemorrhage, hydrocephalus, extra-axial collection or mass lesion/mass effect. Vascular: No hyperdense vessel or unexpected calcification. Skull: Hyperostosis frontalis. Negative for fracture or focal lesion. Sinuses/Orbits: No acute finding. Other: None. CT CERVICAL SPINE FINDINGS Alignment: Degenerative straightening of the normal cervical lordosis. Skull base and vertebrae: No acute fracture. No primary  bone lesion or focal pathologic process. Soft tissues and spinal canal: No prevertebral fluid or swelling. No visible canal hematoma. Disc levels: Moderate to severe multilevel disc space height loss and osteophytosis, worst from C5-C7 Upper chest: Negative. Other: Hypodense left lobe thyroid nodule measuring 2.7 x 2.5 cm (series 11, image 79). IMPRESSION: 1. No acute intracranial pathology. 2. No fracture or static subluxation of the cervical spine. 3. Moderate to severe multilevel cervical disc degenerative disease. 4. Hypodense left lobe thyroid nodule measuring 2.7 x 2.5 cm. Recommend thyroid ultrasound for further evaluation if not already performed, on a nonemergent, outpatient basis (ref: J Am Coll Radiol. 2015 Feb;12(2): 143-50). Electronically Signed   By: Jearld Lesch M.D.   On: 06/21/2022 18:43       Ludean Duhart M.D. [facility] 06/23/2022, 11:52 AM  Available via Epic secure chat 7am-7pm After 7 pm, please refer to night coverage provider  listed on amion.

## 2022-06-24 DIAGNOSIS — I10 Essential (primary) hypertension: Secondary | ICD-10-CM | POA: Diagnosis not present

## 2022-06-24 DIAGNOSIS — I5022 Chronic systolic (congestive) heart failure: Secondary | ICD-10-CM | POA: Diagnosis not present

## 2022-06-24 DIAGNOSIS — F064 Anxiety disorder due to known physiological condition: Secondary | ICD-10-CM | POA: Diagnosis not present

## 2022-06-24 DIAGNOSIS — S32039A Unspecified fracture of third lumbar vertebra, initial encounter for closed fracture: Secondary | ICD-10-CM | POA: Diagnosis not present

## 2022-06-24 LAB — BASIC METABOLIC PANEL
Anion gap: 5 (ref 5–15)
BUN: 18 mg/dL (ref 8–23)
CO2: 30 mmol/L (ref 22–32)
Calcium: 8.7 mg/dL — ABNORMAL LOW (ref 8.9–10.3)
Chloride: 102 mmol/L (ref 98–111)
Creatinine, Ser: 0.84 mg/dL (ref 0.44–1.00)
GFR, Estimated: >60 mL/min (ref >60)
Glucose, Bld: 125 mg/dL — ABNORMAL HIGH (ref 70–99)
Potassium: 5.1 mmol/L (ref 3.5–5.1)
Sodium: 137 mmol/L (ref 135–145)

## 2022-06-24 MED ORDER — POLYETHYLENE GLYCOL 3350 17 G PO PACK
17.0000 g | PACK | Freq: Two times a day (BID) | ORAL | Status: DC
  Administered 2022-06-24 – 2022-06-28 (×7): 17 g via ORAL
  Filled 2022-06-24 (×8): qty 1

## 2022-06-24 MED ORDER — LIDOCAINE 5 % EX PTCH
1.0000 | MEDICATED_PATCH | CUTANEOUS | Status: DC
  Administered 2022-06-24 – 2022-06-27 (×4): 1 via TRANSDERMAL
  Filled 2022-06-24 (×5): qty 1

## 2022-06-24 MED ORDER — HYDROMORPHONE HCL 1 MG/ML IJ SOLN
1.0000 mg | INTRAMUSCULAR | Status: DC | PRN
  Administered 2022-06-24 – 2022-06-29 (×13): 1 mg via INTRAVENOUS
  Filled 2022-06-24 (×15): qty 1

## 2022-06-24 MED ORDER — OXYCODONE HCL 5 MG PO TABS
5.0000 mg | ORAL_TABLET | ORAL | Status: DC | PRN
  Administered 2022-06-24 (×2): 10 mg via ORAL
  Administered 2022-06-25 (×2): 5 mg via ORAL
  Administered 2022-06-26 – 2022-06-29 (×8): 10 mg via ORAL
  Filled 2022-06-24 (×5): qty 2
  Filled 2022-06-24: qty 1
  Filled 2022-06-24: qty 2
  Filled 2022-06-24: qty 1
  Filled 2022-06-24 (×4): qty 2

## 2022-06-24 MED ORDER — METHOCARBAMOL 1000 MG/10ML IJ SOLN
1000.0000 mg | Freq: Three times a day (TID) | INTRAVENOUS | Status: DC
  Administered 2022-06-24 – 2022-06-25 (×4): 1000 mg via INTRAVENOUS
  Filled 2022-06-24: qty 10
  Filled 2022-06-24 (×2): qty 1000
  Filled 2022-06-24: qty 10
  Filled 2022-06-24: qty 1000
  Filled 2022-06-24: qty 10

## 2022-06-24 MED ORDER — CEFAZOLIN IN SODIUM CHLORIDE 3-0.9 GM/100ML-% IV SOLN: 3.0000 g | INTRAVENOUS | Status: DC

## 2022-06-24 NOTE — TOC Progression Note (Signed)
Transition of Care [facility]) - Progression Note    Patient Details  Name: Paige [name] MRN: [id number] Date of Birth: [birth year]  Transition of Care [facility]) CM/SW Contact  Larrie Kass, LCSW Phone Number: 06/24/2022, 10:38 AM  Clinical Narrative:    CSW presented bed offers to pt. She reports wanting to review. TOC to follow.    Expected Discharge Plan: Skilled Nursing Facility Barriers to Discharge: Continued Medical Work up  Expected Discharge Plan and Services       Living arrangements for the past 2 months: Single Family Home                                       Social Determinants of Health (SDOH) Interventions SDOH Screenings   Food Insecurity: No Food Insecurity (06/22/2022)  Housing: Low Risk  (06/22/2022)  Transportation Needs: No Transportation Needs (06/22/2022)  Utilities: Not At Risk (06/22/2022)  Depression (PHQ2-9): Low Risk  (05/23/2018)  Financial Resource Strain: Low Risk  (07/01/2020)  Tobacco Use: Low Risk  (06/21/2022)    Readmission Risk Interventions     No data to display

## 2022-06-24 NOTE — Progress Notes (Addendum)
[facility]                                                                              Paige [name], is a 77 y.o. 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  Vital signs stable on arrival.  Patient reported she was picking up packages in the front of her house and as she was walking back into the house, she lost her balance and fell backwards and injured her back.  No head injury.  MRI of the lumbar spine showed L3 vertebral fracture, acute, approximately 25% central vertebral body height loss.  L4-L5, there is broad-based disc bulge with moderate lateral recess stenosis bilaterally.  L5-S1, disc height loss and disc osteophyte complex with disc extrusion   Neurosurgery, Dr. Dutch Quint was consulted by EDP and recommended physical therapy and outpatient follow-up, no back brace.  5/31: IR consulted, awaiting kyphoplasty, likely on Monday.  Patient will need SNF at discharge  Assessment & Plan    Principal Problem: Acute L3 lumbar vertebral fracture (HCC) status post mechanical fall -MRI of the lumbar spine as above with L3 vertebral fractures, 25% central vertebral body height loss.  L4-L5 with broad based disc bulge with moderate lateral recess stenosis bilaterally, disc height loss and disc osteophyte complex with disc extrusion at L5-S1 -Neurosurgery recommended conservative management with PT OT and pain control -Still excruciating pain with minimal movement, increased Robaxin to 1000mg  q8hrs, increase Dilaudid to 1 mg IV every 4 hours as  needed, increased oxycodone, continue scheduled Tylenol.  Add Lidoderm patch -Appreciate IR consult for possible vertebroplasty, likely on Monday  -Lives alone, likely will need SNF at discharge    Active Problems: Chronic HFrEF due to nonischemic cardiomyopathy - Echo done in August 2022 showing EF 30 to 35%, G1 DD.   -Has chronic nonpitting pedal edema, currently no respiratory symptoms, chest x-ray clear.   - Continue Jardiance, Entresto, and spironolactone.  -EKG reviewed/first-degree AV block, continue Coreg, low-dose 3.125 mg twice daily -O2 sats 93% on room air   Incidental thyroid nodule -CT showed hypodense left lobe thyroid nodule measuring 2.7 x 2.5 cm, recommended nonemergent thyroid ultrasound on an outpatient basis.   -TSH 2.9     History of PVCs Continue mexiletine.   Hypertension -Continue Entresto, Aldactone, resumed low-dose Coreg, heart rate 101-10 4 in the morning. First-degree AV block noted on the EKG, if worsens, will stop Coreg.   Hyperlipidemia Continue Lipitor.   Anxiety and depression Continue bupropion and duloxetine.   RLS Continue Mirapex.   Severe, morbid obesity Estimated body mass index is 52.77 kg/m as calculated from the following:   Height as of this encounter: 5'  7" (1.702 m).   Weight as of this encounter: 152.8 kg.  Code Status: Full code DVT Prophylaxis:  SCDs Start: 06/22/22 0306   Level of Care: Level of care: Telemetry Family Communication: Updated patient Disposition Plan:      Remains inpatient appropriate: Awaiting kyphoplasty, patient will need SNF   Procedures:  MRI lumbar spine  Consultants:   Interventional radiology Neurosurgery  Antimicrobials: None   Medications  acetaminophen  1,000 mg Oral TID   atorvastatin  40 mg Oral Daily   buPROPion  150 mg Oral q morning   carvedilol  3.125 mg Oral BID WC   docusate sodium  100 mg Oral BID   DULoxetine  60 mg Oral Daily   empagliflozin  25 mg Oral Daily    mexiletine  250 mg Oral BID   polyethylene glycol  17 g Oral BID   pramipexole  0.25 mg Oral Daily   pramipexole  0.5 mg Oral QHS   sacubitril-valsartan  1 tablet Oral BID   spironolactone  12.5 mg Oral Daily      Subjective:   Paige [name] was seen and examined today.  Still continues to have significant pain 10/10 on minimal movement.  No dizziness, lightheadedness, chest pain or shortness of breath.  Feeling somewhat frustrated with pain.    Objective:   Vitals:   06/23/22 1256 06/23/22 1754 06/24/22 0557 06/24/22 1228  BP: (!) 103/57 115/66 115/73 (!) 116/55  Pulse: 76 78 93 85  Resp: 20  20 20   Temp: 98.3 F (36.8 C)  97.8 F (36.6 C) 98.4 F (36.9 C)  TempSrc: Oral  Oral Oral  SpO2: 91%  95% 93%  Weight:      Height:        Intake/Output Summary (Last 24 hours) at 06/24/2022 1436 Last data filed at 06/24/2022 1339 Gross per 24 hour  Intake 1225 ml  Output 1200 ml  Net 25 ml     Wt Readings from Last 3 Encounters:  06/22/22 (!) 152.8 kg  10/18/21 (!) 142.9 kg  05/11/21 (!) 143.7 kg    Physical Exam General: Alert and oriented x 3, NAD, uncomfortable Cardiovascular: S1 S2 clear, RRR.  Respiratory: CTAB Gastrointestinal: Soft, nontender, nondistended, NBS Ext: no pedal edema bilaterally Neuro: no new deficits Psych: Normal affect    Data Reviewed:  I have personally reviewed following labs    CBC Lab Results  Component Value Date   WBC 9.3 06/23/2022   RBC 4.99 06/23/2022   HGB 15.3 (H) 06/23/2022   HCT 48.6 (H) 06/23/2022   MCV 97.4 06/23/2022   MCH 30.7 06/23/2022   PLT 162 06/23/2022   MCHC 31.5 06/23/2022   RDW 14.3 06/23/2022   LYMPHSABS 0.8 06/21/2022   MONOABS 0.6 06/21/2022   EOSABS 0.2 06/21/2022   BASOSABS 0.1 06/21/2022     Last metabolic panel Lab Results  Component Value Date   NA 137 06/24/2022   K 5.1 06/24/2022   CL 102 06/24/2022   CO2 30 06/24/2022   BUN 18 06/24/2022   CREATININE 0.84 06/24/2022   GLUCOSE 125  (H) 06/24/2022   GFRNONAA >60 06/24/2022   GFRAA 61 02/28/2018   CALCIUM 8.7 (L) 06/24/2022   PHOS 3.7 05/08/2020   PROT 6.6 06/21/2022   ALBUMIN 3.5 06/21/2022   LABGLOB 1.9 02/28/2018   AGRATIO 2.4 (H) 02/28/2018   BILITOT 0.8 06/21/2022   ALKPHOS 142 (H) 06/21/2022   AST 26 06/21/2022   ALT 25 06/21/2022   ANIONGAP  5 06/24/2022    CBG (last 3)  No results for input(s): "GLUCAP" in the last 72 hours.    Coagulation Profile: Recent Labs  Lab 06/23/22 0750  INR 1.1     Radiology Studies: I have personally reviewed the imaging studies  No results found.     Thad Ranger M.D. [facility] 06/24/2022, 2:36 PM  Available via Epic secure chat 7am-7pm After 7 pm, please refer to night coverage provider listed on amion.

## 2022-06-24 NOTE — Progress Notes (Signed)
Physical Therapy Treatment Patient Details Name: Paige [name] MRN: [id number] DOB: 28-Nov-1945 Today's Date: 06/24/2022   History of Present Illness Ms. Kalbach is a 77 yr old female who had a mechanical fall with concern for back injury. Imaging revealed L3 vertebral fracture, acute, approximately 25% central vertebral body height loss.  L4-L5, there is broad-based disc bulge with moderate lateral recess stenosis bilaterally.  L5-S1, disc height loss and disc osteophyte complex with disc extrusion. Neurosurgery recommended conservative management and no back brace. IR consult pending for possible vertebroplasty. 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  Pt reports pain a little better then when attempting mobility with OT this morning however still goes up to 7/10.  Pt felt unable to stand and preferred left lateral lean in sitting to offload right side.  Per last IR note, "will TENT place on IR schedule for 6/3."       Recommendations for follow up therapy are one component of a multi-disciplinary discharge planning process, led by the attending physician.  Recommendations may be updated based on patient status, additional functional criteria and insurance authorization.  Follow Up Recommendations  Can patient physically be transported by private vehicle: No    Assistance Recommended at Discharge Frequent or constant Supervision/Assistance  Patient can return home with the following Two people to help with walking and/or transfers;Two people to help with bathing/dressing/bathroom;Assistance with cooking/housework;Assist for transportation;Help with stairs or ramp for entrance   Equipment Recommendations  None recommended by PT    Recommendations for Other Services       Precautions / Restrictions Precautions Precautions:  Fall;Back Precaution Comments: back for comfort     Mobility  Bed Mobility Overal bed mobility: Needs Assistance Bed Mobility: Rolling, Sidelying to Sit, Sit to Sidelying Rolling: Min guard Sidelying to sit: Min guard     Sit to sidelying: Mod assist General bed mobility comments: cues for log roll technique, increased time and effort due to pain and positioning; assist required for bil LEs back onto bed; pt able to pull herself up [facility] with trendelenburg position of bed and head board    Transfers                   General transfer comment: pt felt unable to tolerate    Ambulation/Gait                   Stairs             Wheelchair Mobility    Modified Rankin (Stroke Patients Only)       Balance Overall balance assessment: Needs assistance Sitting-balance support: Bilateral upper extremity supported, Feet supported Sitting balance-Leahy Scale: Fair Sitting balance - Comments: UE support more for pain then balance, pt prefers left lateral lean for comfort due to right lower back pain with sitting completely upright                                    Cognition Arousal/Alertness: Awake/alert Behavior During Therapy: WFL for tasks assessed/performed Overall Cognitive Status: Within Functional Limits for tasks assessed  Exercises      General Comments        Pertinent Vitals/Pain Pain Assessment Pain Assessment: 0-10 Pain Score: 7  Pain Location: right lower back Pain Descriptors / Indicators: Spasm, Sore Pain Intervention(s): Repositioned, Premedicated before session, Monitored during session (premedicated with IV pain meds)    Home Living                          Prior Function            PT Goals (current goals can now be found in the care plan section) Progress towards PT goals: Progressing toward goals    Frequency    Min 1X/week       PT Plan Current plan remains appropriate    Co-evaluation              AM-PAC PT "6 Clicks" Mobility   Outcome Measure  Help needed turning from your back to your side while in a flat bed without using bedrails?: A Lot Help needed moving from lying on your back to sitting on the side of a flat bed without using bedrails?: A Lot Help needed moving to and from a bed to a chair (including a wheelchair)?: Total Help needed standing up from a chair using your arms (e.g., wheelchair or bedside chair)?: Total Help needed to walk in hospital room?: Total Help needed climbing 3-5 steps with a railing? : Total 6 Click Score: 8    End of Session   Activity Tolerance: Patient limited by pain Patient left: in bed;with call bell/phone within reach;with bed alarm set Nurse Communication: Mobility status PT Visit Diagnosis: Difficulty in walking, not elsewhere classified (R26.2);History of falling (Z91.81);Pain     Time: 1610-9604 PT Time Calculation (min) (ACUTE ONLY): 18 min  Charges:  $Therapeutic Activity: 8-22 mins                    Paulino Door, DPT Physical Therapist Acute Rehabilitation Services Office: [phone]    Janan Halter [place] 06/24/2022, 2:24 PM

## 2022-06-24 NOTE — Progress Notes (Signed)
Patient ID: Paige [name], female   DOB: 1945-02-19, 77 y.o.   MRN: [id number] Pt tent scheduled for L3 KP on 6/3 at [facility]. See consult note from 5/29 for additional details; no acute changes in pt condition; afebrile, no reports of dysuria; back pain persists; Risks and benefits of procedure were discussed with the patient including, but not limited to education regarding the natural healing process of compression fractures without intervention, bleeding, infection, cement migration which may cause spinal cord damage, paralysis, pulmonary embolism or even death. Consent signed and in chart.

## 2022-06-24 NOTE — Progress Notes (Signed)
Occupational Therapy Treatment Patient Details Name: GIARA ZORA MRN: [id number] DOB: Apr 24, 1945 Today's Date: 06/24/2022   History of present illness Ms. Bockus is a 77 yr old female who had a mechanical fall with concern for back injury. Imaging revealed L3 vertebral fracture, acute, approximately 25% central vertebral body height loss.  L4-L5, there is broad-based disc bulge with moderate lateral recess stenosis bilaterally.  L5-S1, disc height loss and disc osteophyte complex with disc extrusion. Neurosurgery recommended conservative management and no back brace. IR consult pending for possible vertebroplasty. 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 She described the pain as a "stabbing" sensation. As such, she required increased time and effort, as well as cues for improved comfort when performing any progressive activity. She was assisted into sitting EOB, however she was unable to further tolerate attempts at out of bed activity, secondary to pain. She required assist and instruction on positioning at bed level, to help decrease pain. She is motivated towards therapy, however is limited by pain and decreased activity tolerance. She will benefit from further OT services to facilitate progressive ADL performance and to decrease the risk for restricted participation in meaningful activities.    Recommendations for follow up therapy are one component of a multi-disciplinary discharge planning process, led by the attending physician.  Recommendations may be updated based on patient status, additional functional criteria and insurance authorization.    Assistance Recommended at Discharge Frequent or constant Supervision/Assistance  Patient can return home with the following  A lot of help with  bathing/dressing/bathroom;Two people to help with walking and/or transfers;Assistance with cooking/housework;Assist for transportation;Help with stairs or ramp for entrance   Equipment Recommendations  Other (comment) (to be determined pending progress at next setting)       Precautions / Restrictions Precautions Precautions: Fall Restrictions Weight Bearing Restrictions: No       Mobility Bed Mobility Overal bed mobility: Needs Assistance Bed Mobility: Rolling, Sidelying to Sit, Sit to Sidelying Rolling: Mod assist Sidelying to sit: Mod assist     Sit to sidelying: Mod assist General bed mobility comments: cues for log roll, assisted legs and trunk    Transfers        General transfer comment: the pt was unable to attempt, due to significant back pain; she requested to return to supine after a few minutes EOB         ADL either performed or assessed with clinical judgement   ADL Overall ADL's : Needs assistance/impaired Eating/Feeding: Independent;Bed level   Grooming: Set up;Bed level           Upper Body Dressing : Minimal assistance;Sitting Upper Body Dressing Details (indicate cue type and reason): simulated Lower Body Dressing: Total assistance Lower Body Dressing Details (indicate cue type and reason): seated EOB                      Cognition Arousal/Alertness: Awake/alert Behavior During Therapy: WFL for tasks assessed/performed Overall Cognitive Status: Within Functional Limits for tasks assessed                        Pertinent Vitals/ Pain       Pain Assessment Pain Assessment: 0-10 Pain Score: 8  Pain Location: low back Pain Descriptors / Indicators: Stabbing with activity  Pain Intervention(s):  Limited activity within patient's tolerance, Monitored during session, Other (comment), She reported recently receiving pain medication.         Frequency  Min 1X/week        Progress Toward Goals  OT Goals(current goals  can now be found in the care plan section)     Acute Rehab OT Goals OT Goal Formulation: With patient Time For Goal Achievement: 07/06/22 Potential to Achieve Goals: Good  Plan Discharge plan remains appropriate       AM-PAC OT "6 Clicks" Daily Activity     Outcome Measure   Help from another person eating meals?: None Help from another person taking care of personal grooming?: A Little Help from another person toileting, which includes using toliet, bedpan, or urinal?: Total Help from another person bathing (including washing, rinsing, drying)?: A Lot Help from another person to put on and taking off regular upper body clothing?: A Little Help from another person to put on and taking off regular lower body clothing?: Total 6 Click Score: 14    End of Session Equipment Utilized During Treatment: Other (comment) (None)  OT Visit Diagnosis: Unsteadiness on feet (R26.81);Pain;History of falling (Z91.81)   Activity Tolerance Patient limited by pain   Patient Left in bed;with call bell/phone within reach;with bed alarm set   Nurse Communication Mobility status        Time: 4098-1191 OT Time Calculation (min): 23 min  Charges: OT General Charges $OT Visit: 1 Visit OT Treatments $Therapeutic Activity: 23-37 mins     Reuben Likes, OTR/L 06/24/2022, 12:52 PM

## 2022-06-25 DIAGNOSIS — I5022 Chronic systolic (congestive) heart failure: Secondary | ICD-10-CM | POA: Diagnosis not present

## 2022-06-25 DIAGNOSIS — F419 Anxiety disorder, unspecified: Secondary | ICD-10-CM

## 2022-06-25 DIAGNOSIS — S32039A Unspecified fracture of third lumbar vertebra, initial encounter for closed fracture: Secondary | ICD-10-CM | POA: Diagnosis not present

## 2022-06-25 DIAGNOSIS — E782 Mixed hyperlipidemia: Secondary | ICD-10-CM

## 2022-06-25 DIAGNOSIS — I10 Essential (primary) hypertension: Secondary | ICD-10-CM | POA: Diagnosis not present

## 2022-06-25 LAB — CBC
HCT: 46.6 % — ABNORMAL HIGH (ref 36.0–46.0)
Hemoglobin: 14.5 g/dL (ref 12.0–15.0)
MCH: 30.1 pg (ref 26.0–34.0)
MCHC: 31.1 g/dL (ref 30.0–36.0)
MCV: 96.9 fL (ref 80.0–100.0)
Platelets: 182 10*3/uL (ref 150–400)
RBC: 4.81 MIL/uL (ref 3.87–5.11)
RDW: 14.3 % (ref 11.5–15.5)
WBC: 8.7 10*3/uL (ref 4.0–10.5)
nRBC: 0 % (ref 0.0–0.2)

## 2022-06-25 LAB — BASIC METABOLIC PANEL
Anion gap: 7 (ref 5–15)
BUN: 17 mg/dL (ref 8–23)
CO2: 29 mmol/L (ref 22–32)
Calcium: 8.6 mg/dL — ABNORMAL LOW (ref 8.9–10.3)
Chloride: 99 mmol/L (ref 98–111)
Creatinine, Ser: 0.8 mg/dL (ref 0.44–1.00)
GFR, Estimated: >60 mL/min (ref >60)
Glucose, Bld: 110 mg/dL — ABNORMAL HIGH (ref 70–99)
Potassium: 4.1 mmol/L (ref 3.5–5.1)
Sodium: 135 mmol/L (ref 135–145)

## 2022-06-25 MED ORDER — METHOCARBAMOL 1000 MG/10ML IJ SOLN
1000.0000 mg | Freq: Three times a day (TID) | INTRAVENOUS | Status: DC
  Administered 2022-06-25 – 2022-06-26 (×2): 1000 mg via INTRAVENOUS
  Filled 2022-06-25: qty 10

## 2022-06-25 MED ORDER — ONDANSETRON HCL 4 MG/2ML IJ SOLN
4.0000 mg | Freq: Four times a day (QID) | INTRAMUSCULAR | Status: DC | PRN
  Administered 2022-06-25: 4 mg via INTRAVENOUS
  Filled 2022-06-25: qty 2

## 2022-06-25 NOTE — TOC Progression Note (Signed)
Transition of Care [facility]) - Progression Note    Patient Details  Name: ZAIDYN BARTUNEK MRN: [id number] Date of Birth: 28-Jul-1945  Transition of Care [facility]) CM/SW Contact  Georgie Chard, [place] Phone Number: 06/25/2022, 12:38 PM  Clinical Narrative:    CSW spoke to the patient at bedside about bed choices. At this time the patient has picked [facility]. This CSW reached out to whitney from central-intake about patient acceptance. At this time Alphonzo Lemmings has stated to start AUTH Monday as the patient DC is undecided due to her surgery on Monday. Providers reported that he is unsure of when she is ready. TOC will follow up Monday with provider after patient's surgery. TOC will also follow up with Whitney about starting INS AUTH. TOC will continue to follow.   Expected Discharge Plan: Skilled Nursing Facility Barriers to Discharge: Continued Medical Work up  Expected Discharge Plan and Services       Living arrangements for the past 2 months: Single Family Home                                       Social Determinants of Health (SDOH) Interventions SDOH Screenings   Food Insecurity: No Food Insecurity (06/22/2022)  Housing: Low Risk  (06/22/2022)  Transportation Needs: No Transportation Needs (06/22/2022)  Utilities: Not At Risk (06/22/2022)  Depression (PHQ2-9): Low Risk  (05/23/2018)  Financial Resource Strain: Low Risk  (07/01/2020)  Tobacco Use: Low Risk  (06/21/2022)    Readmission Risk Interventions     No data to display

## 2022-06-25 NOTE — Progress Notes (Signed)
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 MRI of the lumbar spine showed L3 vertebral fracture, acute, approximately 25% central vertebral body height loss. L4-L5, there is broad-based disc bulge with moderate lateral recess stenosis bilaterally. L5-S1, disc height loss and disc osteophyte complex with disc extrusion . Hospitalist called for admission, neurosurgery called in consult (Dr Dutch Quint).  Assessment & Plan:   Principal Problem:   Lumbar vertebral fracture (HCC) Active Problems:   Anxiety disorder   Essential hypertension   Hyperlipidemia   Thyroid nodule   First degree AV block   Chronic HFrEF (heart failure with reduced ejection fraction) (HCC)   PVC's (premature ventricular contractions)   Acute L3 lumbar vertebral fracture (HCC) status post mechanical fall -Kyphoplasty tentatively planned 6/3 per interventional radiology -Disposition pending PT evaluation postoperatively, likely SNF but patient hopeful to have marked improvement and discharge home. -MRI of the lumbar spine as above with L3 vertebral fractures, 25% central vertebral body height loss.  L4-L5 with broad based disc bulge with moderate lateral recess stenosis bilaterally, disc height loss and disc osteophyte complex with disc extrusion at L5-S1 -Neurosurgery recommended conservative management with PT OT and pain control -Pain currently well-controlled on current regimen -Appreciate IR consult for possible vertebroplasty -Lives alone, likely will need SNF at discharge    Chronic HFrEF due to nonischemic cardiomyopathy, without exacerbation - Echo done in August 2022 showing  systolic dysfunction with EF 30 to 35%, and concurrent grade 1 diastolic dysfunction.  No indication to repeat echo at this time - Has chronic nonpitting pedal edema, clinically not volume overloaded - Continue Jardiance, Entresto, and spironolactone.  -EKG reviewed/first-degree AV block, continue Coreg, low-dose 3.125 mg twice daily -O2 sats 93% on room air    Incidental thyroid nodule -CT showed hypodense left lobe thyroid nodule measuring 2.7 x 2.5 cm, recommended nonemergent thyroid ultrasound on an outpatient basis.   -TSH 2.9   History of PVCs Continue mexiletine.   Hypertension -Continue Entresto, Aldactone, resumed low-dose Coreg -First-degree AV block noted on the EKG, if worsens, will stop/reduce Coreg dose -Currently well-controlled if not borderline hypotensive, continue to follow patient at risk for orthostatic hypotension, low threshold to decrease medication burden if symptomatic.   Hyperlipidemia Continue Lipitor.   Anxiety and depression Continue bupropion and duloxetine.   RLS Continue Mirapex.     DVT prophylaxis: SCDs Start: 06/22/22 0306 Code Status: DNR Family Communication: None present  Status is: Inpatient  Dispo: The patient is from: Home              Anticipated d/c is to: To be determined              Anticipated d/c date is: To be determined pending procedure              Patient currently not medically stable for discharge  Consultants:  Interventional radiology, neurosurgery  Procedures:  Tentatively planned kyphoplasty 06/27/2022  Antimicrobials:  Perioperatively  Subjective: No acute issues or events overnight, pain moderately well-controlled today denies nausea vomiting diarrhea constipation headache fevers chills or chest pain  Objective: Vitals:   06/24/22 0557 06/24/22 1228 06/24/22 2016 06/25/22 0415  BP: 115/73 Marland Kitchen)  116/55 (!) 122/56 (!) 101/49  Pulse: 93 85 84 83  Resp: 20 20 16 20   Temp: 97.8 F (36.6 C) 98.4 F (36.9  C) 98 F (36.7 C) (!) 97.4 F (36.3 C)  TempSrc: Oral Oral Oral Oral  SpO2: 95% 93% 94% 95%  Weight:      Height:        Intake/Output Summary (Last 24 hours) at 06/25/2022 0736 Last data filed at 06/24/2022 2341 Gross per 24 hour  Intake 1005 ml  Output 1300 ml  Net -295 ml   Filed Weights   06/22/22 0134  Weight: (!) 152.8 kg    Examination:  General:  Pleasantly resting in bed, No acute distress. HEENT:  Normocephalic atraumatic.  Sclerae nonicteric, noninjected.  Extraocular movements intact bilaterally. Neck:  Without mass or deformity.  Trachea is midline. Lungs:  Clear to auscultate bilaterally without rhonchi, wheeze, or rales. Heart:  Regular rate and rhythm.  Without murmurs, rubs, or gallops. Abdomen:  Soft, nontender, nondistended.  Without guarding or rebound. Extremities: Without cyanosis, clubbing, edema, or obvious deformity. Skin:  Warm and dry, no erythema  Data Reviewed: I have personally reviewed following labs and imaging studies  CBC: Recent Labs  Lab 06/21/22 1741 06/23/22 0441 06/25/22 0511  WBC 10.1 9.3 8.7  NEUTROABS 8.4*  --   --   HGB 15.0 15.3* 14.5  HCT 47.5* 48.6* 46.6*  MCV 95.6 97.4 96.9  PLT 208 162 182   Basic Metabolic Panel: Recent Labs  Lab 06/21/22 1741 06/23/22 0441 06/24/22 0444  NA 138 135 137  K 4.6 4.6 5.1  CL 104 100 102  CO2 29 28 30   GLUCOSE 95 121* 125*  BUN 21 16 18   CREATININE 0.89 0.95 0.84  CALCIUM 8.7* 8.5* 8.7*   GFR: Estimated Creatinine Clearance: 86.9 mL/min (by C-G formula based on SCr of 0.84 mg/dL). Liver Function Tests: Recent Labs  Lab 06/21/22 1741  AST 26  ALT 25  ALKPHOS 142*  BILITOT 0.8  PROT 6.6  ALBUMIN 3.5   No results for input(s): "LIPASE", "AMYLASE" in the last 168 hours. No results for input(s): "AMMONIA" in the last 168 hours. Coagulation Profile: Recent Labs  Lab 06/23/22 0750  INR 1.1   Cardiac Enzymes: No results for input(s): "CKTOTAL", "CKMB", "CKMBINDEX",  "TROPONINI" in the last 168 hours. BNP (last 3 results) No results for input(s): "PROBNP" in the last 8760 hours. HbA1C: No results for input(s): "HGBA1C" in the last 72 hours. CBG: No results for input(s): "GLUCAP" in the last 168 hours. Lipid Profile: No results for input(s): "CHOL", "HDL", "LDLCALC", "TRIG", "CHOLHDL", "LDLDIRECT" in the last 72 hours. Thyroid Function Tests: No results for input(s): "TSH", "T4TOTAL", "FREET4", "T3FREE", "THYROIDAB" in the last 72 hours. Anemia Panel: No results for input(s): "VITAMINB12", "FOLATE", "FERRITIN", "TIBC", "IRON", "RETICCTPCT" in the last 72 hours. Sepsis Labs: No results for input(s): "PROCALCITON", "LATICACIDVEN" in the last 168 hours.  No results found for this or any previous visit (from the past 240 hour(s)).   Radiology Studies: No results found.  Scheduled Meds:  atorvastatin  40 mg Oral Daily   buPROPion  150 mg Oral q morning   carvedilol  3.125 mg Oral BID WC   docusate sodium  100 mg Oral BID   DULoxetine  60 mg Oral Daily   empagliflozin  25 mg Oral Daily   lidocaine  1 patch Transdermal Q24H   mexiletine  250 mg Oral BID   polyethylene glycol  17 g Oral  BID   pramipexole  0.25 mg Oral Daily   pramipexole  0.5 mg Oral QHS   sacubitril-valsartan  1 tablet Oral BID   spironolactone  12.5 mg Oral Daily   Continuous Infusions:  [START ON 06/27/2022]  ceFAZolin (ANCEF) IV     methocarbamol (ROBAXIN) IV 1,000 mg (06/25/22 0350)     LOS: 2 days   Time spent:  Azucena Fallen, DO [facility]  If 7PM-7AM, please contact night-coverage [email or web address]  06/25/2022, 7:36 AM

## 2022-06-26 DIAGNOSIS — I10 Essential (primary) hypertension: Secondary | ICD-10-CM | POA: Diagnosis not present

## 2022-06-26 DIAGNOSIS — I5022 Chronic systolic (congestive) heart failure: Secondary | ICD-10-CM | POA: Diagnosis not present

## 2022-06-26 DIAGNOSIS — F419 Anxiety disorder, unspecified: Secondary | ICD-10-CM | POA: Diagnosis not present

## 2022-06-26 DIAGNOSIS — S32039A Unspecified fracture of third lumbar vertebra, initial encounter for closed fracture: Secondary | ICD-10-CM | POA: Diagnosis not present

## 2022-06-26 MED ORDER — METHOCARBAMOL 500 MG PO TABS
1000.0000 mg | ORAL_TABLET | Freq: Three times a day (TID) | ORAL | Status: DC | PRN
  Administered 2022-06-28: 1000 mg via ORAL
  Filled 2022-06-26: qty 2

## 2022-06-26 MED ORDER — CEFAZOLIN IN SODIUM CHLORIDE 3-0.9 GM/100ML-% IV SOLN
3.0000 g | INTRAVENOUS | Status: AC
  Administered 2022-06-27: 3 g via INTRAVENOUS
  Filled 2022-06-26: qty 100

## 2022-06-26 NOTE — TOC Progression Note (Signed)
Transition of Care [facility]) - Progression Note    Patient Details  Name: Paige [name] MRN: [id number] Date of Birth: 08-23-1945  Transition of Care [facility]) CM/SW Contact  Georgie Chard, LCSW Phone Number: 06/26/2022, 12:35 PM  Clinical Narrative:    CSW has reached out to the provider about the patient's DC which is expected to be Tuesday. This CSW did update facility. At this time [facility] will continue to follow the patient and start AUTH tomorrow as requested by facility.    Expected Discharge Plan: Skilled Nursing Facility Barriers to Discharge: Continued Medical Work up  Expected Discharge Plan and Services       Living arrangements for the past 2 months: Single Family Home                                       Social Determinants of Health (SDOH) Interventions SDOH Screenings   Food Insecurity: No Food Insecurity (06/22/2022)  Housing: Low Risk  (06/22/2022)  Transportation Needs: No Transportation Needs (06/22/2022)  Utilities: Not At Risk (06/22/2022)  Depression (PHQ2-9): Low Risk  (05/23/2018)  Financial Resource Strain: Low Risk  (07/01/2020)  Tobacco Use: Low Risk  (06/21/2022)    Readmission Risk Interventions     No data to display

## 2022-06-26 NOTE — Plan of Care (Signed)

## 2022-06-26 NOTE — Progress Notes (Addendum)
At shift change patient had been eating dinner while chewing food really good but it would not go down. Patient told RN that she never felt nauseous. Patient just burped and food came back up. Patient was not in any distress. Patient felt much better. Patient mentioned that this was not the first time this has happened.

## 2022-06-26 NOTE — Progress Notes (Signed)
Notified MD that patient lost IV access this morning due to IV infiltration. Order was placed for IV team. IV team assessed patient and consulted with PICC nurse. MD would have to place a PICC order if necessary. MD wants to hold off on PICC. IV team suggested to RN to let patient's arms and veins rest overnight and have night shift RN place an order early in the morning for IV access for procedure tomorrow. Will pass along to night shift RN.

## 2022-06-26 NOTE — Progress Notes (Signed)
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 MRI of the lumbar spine showed L3 vertebral fracture, acute, approximately 25% central vertebral body height loss. L4-L5, there is broad-based disc bulge with moderate lateral recess stenosis bilaterally. L5-S1, disc height loss and disc osteophyte complex with disc extrusion . Hospitalist called for admission, neurosurgery called in consult (Dr Dutch Quint).  Assessment & Plan:   Principal Problem:   Lumbar vertebral fracture (HCC) Active Problems:   Anxiety disorder   Essential hypertension   Hyperlipidemia   Thyroid nodule   First degree AV block   Chronic HFrEF (heart failure with reduced ejection fraction) (HCC)   PVC's (premature ventricular contractions)   Acute L3 lumbar vertebral fracture (HCC) status post mechanical fall -Kyphoplasty tentatively planned 6/3 per interventional radiology -Disposition pending PT evaluation postoperatively, likely SNF but patient hopeful to have marked improvement and discharge home. -MRI of the lumbar spine as above with L3 vertebral fractures, 25% central vertebral body height loss.  L4-L5 with broad based disc bulge with moderate lateral recess stenosis bilaterally, disc height loss and disc osteophyte complex with disc extrusion at L5-S1 -Neurosurgery recommended conservative management with PT OT and pain control -Pain currently well-controlled on current regimen -Appreciate IR consult for possible vertebroplasty 06/27/22 -Lives alone, likely will need SNF at discharge pending PT evaluation.   Chronic HFrEF due to nonischemic cardiomyopathy, without exacerbation - Echo  done in August 2022 showing systolic dysfunction with EF 30 to 35%, and concurrent grade 1 diastolic dysfunction.  No indication to repeat echo at this time - Has chronic nonpitting pedal edema, clinically not volume overloaded - Continue Jardiance, Entresto, and spironolactone.  -EKG reviewed/first-degree AV block, continue Coreg, low-dose 3.125 mg twice daily -O2 sats 93% on room air    Incidental thyroid nodule -CT showed hypodense left lobe thyroid nodule measuring 2.7 x 2.5 cm, recommended nonemergent thyroid ultrasound on an outpatient basis.   -TSH 2.9   History of PVCs Continue mexiletine.   Hypertension -Continue Entresto, Aldactone, resumed low-dose Coreg -First-degree AV block noted on the EKG, if worsens, will stop/reduce Coreg dose -Currently well-controlled if not borderline hypotensive, continue to follow patient at risk for orthostatic hypotension, low threshold to decrease medication burden if symptomatic.   Hyperlipidemia Continue Lipitor.   Anxiety and depression Continue bupropion and duloxetine.   RLS Continue Mirapex.  DVT prophylaxis: SCDs Start: 06/22/22 0306 Code Status: DNR Family Communication: None present  Status is: Inpatient  Dispo: The patient is from: Home              Anticipated d/c is to: To be determined              Anticipated d/c date is: To be determined pending procedure              Patient currently not medically stable for discharge  Consultants:  Interventional radiology, neurosurgery  Procedures:  Tentatively planned kyphoplasty 06/27/2022  Antimicrobials:  Perioperatively  Subjective: No acute issues or events overnight, pain moderately well-controlled today denies nausea vomiting diarrhea constipation headache fevers chills or chest pain  Objective: Vitals:   06/25/22 1525 06/25/22 1834 06/25/22 1944 06/26/22 0426  BP: 115/67 123/74 Marland Kitchen)  112/57 (!) 100/56  Pulse: 88 87 85 80  Resp: 20 20 20 20   Temp:  98.7 F (37.1  C) 98.9 F (37.2 C) 97.9 F (36.6 C)  TempSrc:  Oral Oral Oral  SpO2: 98% 99% 97% 95%  Weight:      Height:        Intake/Output Summary (Last 24 hours) at 06/26/2022 0753 Last data filed at 06/26/2022 0436 Gross per 24 hour  Intake 1280 ml  Output 4950 ml  Net -3670 ml    Filed Weights   06/22/22 0134  Weight: (!) 152.8 kg    Examination:  General:  Pleasantly resting in bed, No acute distress. HEENT:  Normocephalic atraumatic.  Sclerae nonicteric, noninjected.  Extraocular movements intact bilaterally. Neck:  Without mass or deformity.  Trachea is midline. Lungs:  Clear to auscultate bilaterally without rhonchi, wheeze, or rales. Heart:  Regular rate and rhythm.  Without murmurs, rubs, or gallops. Abdomen:  Soft, nontender, nondistended.  Without guarding or rebound. Extremities: Without cyanosis, clubbing, edema, or obvious deformity. Skin:  Warm and dry, no erythema  Data Reviewed: I have personally reviewed following labs and imaging studies  CBC: Recent Labs  Lab 06/21/22 1741 06/23/22 0441 06/25/22 0511  WBC 10.1 9.3 8.7  NEUTROABS 8.4*  --   --   HGB 15.0 15.3* 14.5  HCT 47.5* 48.6* 46.6*  MCV 95.6 97.4 96.9  PLT 208 162 182    Basic Metabolic Panel: Recent Labs  Lab 06/21/22 1741 06/23/22 0441 06/24/22 0444 06/25/22 0657  NA 138 135 137 135  K 4.6 4.6 5.1 4.1  CL 104 100 102 99  CO2 29 28 30 29   GLUCOSE 95 121* 125* 110*  BUN 21 16 18 17   CREATININE 0.89 0.95 0.84 0.80  CALCIUM 8.7* 8.5* 8.7* 8.6*    GFR: Estimated Creatinine Clearance: 91.2 mL/min (by C-G formula based on SCr of 0.8 mg/dL). Liver Function Tests: Recent Labs  Lab 06/21/22 1741  AST 26  ALT 25  ALKPHOS 142*  BILITOT 0.8  PROT 6.6  ALBUMIN 3.5    No results for input(s): "LIPASE", "AMYLASE" in the last 168 hours. No results for input(s): "AMMONIA" in the last 168 hours. Coagulation Profile: Recent Labs  Lab 06/23/22 0750  INR 1.1    No results found for this  or any previous visit (from the past 240 hour(s)).   Radiology Studies: No results found.  Scheduled Meds:  atorvastatin  40 mg Oral Daily   buPROPion  150 mg Oral q morning   carvedilol  3.125 mg Oral BID WC   docusate sodium  100 mg Oral BID   DULoxetine  60 mg Oral Daily   empagliflozin  25 mg Oral Daily   lidocaine  1 patch Transdermal Q24H   mexiletine  250 mg Oral BID   polyethylene glycol  17 g Oral BID   pramipexole  0.25 mg Oral Daily   pramipexole  0.5 mg Oral QHS   sacubitril-valsartan  1 tablet Oral BID   spironolactone  12.5 mg Oral Daily   Continuous Infusions:  [START ON 06/27/2022]  ceFAZolin (ANCEF) IV     methocarbamol (ROBAXIN) IV 1,000 mg (06/26/22 0112)     LOS: 3 days   Time spent:  Azucena Fallen, DO [facility]  If 7PM-7AM, please contact night-coverage [email or web address]  06/26/2022, 7:53 AM

## 2022-06-27 ENCOUNTER — Inpatient Hospital Stay (HOSPITAL_COMMUNITY): Payer: Medicare Other

## 2022-06-27 DIAGNOSIS — S32039A Unspecified fracture of third lumbar vertebra, initial encounter for closed fracture: Secondary | ICD-10-CM | POA: Diagnosis not present

## 2022-06-27 HISTORY — PX: IR KYPHO LUMBAR INC FX REDUCE BONE BX UNI/BIL CANNULATION INC/IMAGING: IMG5519

## 2022-06-27 LAB — CBC WITH DIFFERENTIAL/PLATELET
Abs Immature Granulocytes: 0.03 10*3/uL (ref 0.00–0.07)
Basophils Absolute: 0.1 10*3/uL (ref 0.0–0.1)
Basophils Relative: 1 %
Eosinophils Absolute: 0.3 10*3/uL (ref 0.0–0.5)
Eosinophils Relative: 4 %
HCT: 46.2 % — ABNORMAL HIGH (ref 36.0–46.0)
Hemoglobin: 14.5 g/dL (ref 12.0–15.0)
Immature Granulocytes: 0 %
Lymphocytes Relative: 12 %
Lymphs Abs: 0.8 10*3/uL (ref 0.7–4.0)
MCH: 30.8 pg (ref 26.0–34.0)
MCHC: 31.4 g/dL (ref 30.0–36.0)
MCV: 98.1 fL (ref 80.0–100.0)
Monocytes Absolute: 0.8 10*3/uL (ref 0.1–1.0)
Monocytes Relative: 11 %
Neutro Abs: 5.2 10*3/uL (ref 1.7–7.7)
Neutrophils Relative %: 72 %
Platelets: 196 10*3/uL (ref 150–400)
RBC: 4.71 MIL/uL (ref 3.87–5.11)
RDW: 14.5 % (ref 11.5–15.5)
WBC: 7.2 10*3/uL (ref 4.0–10.5)
nRBC: 0 % (ref 0.0–0.2)

## 2022-06-27 MED ORDER — MIDAZOLAM HCL 2 MG/2ML IJ SOLN
INTRAMUSCULAR | Status: AC
  Filled 2022-06-27: qty 6

## 2022-06-27 MED ORDER — FENTANYL CITRATE (PF) 100 MCG/2ML IJ SOLN
INTRAMUSCULAR | Status: AC
  Filled 2022-06-27: qty 4

## 2022-06-27 MED ORDER — IOHEXOL 300 MG/ML  SOLN
50.0000 mL | Freq: Once | INTRAMUSCULAR | Status: AC | PRN
  Administered 2022-06-27: 25 mL

## 2022-06-27 MED ORDER — LIDOCAINE HCL (PF) 2 % IJ SOLN
INTRAMUSCULAR | Status: AC
  Filled 2022-06-27: qty 10

## 2022-06-27 MED ORDER — FENTANYL CITRATE (PF) 100 MCG/2ML IJ SOLN
INTRAMUSCULAR | Status: AC | PRN
  Administered 2022-06-27 (×4): 50 ug via INTRAVENOUS

## 2022-06-27 MED ORDER — LIDOCAINE HCL (PF) 1 % IJ SOLN
INTRAMUSCULAR | Status: AC
  Filled 2022-06-27: qty 30

## 2022-06-27 MED ORDER — MIDAZOLAM HCL 2 MG/2ML IJ SOLN
INTRAMUSCULAR | Status: AC | PRN
  Administered 2022-06-27: .5 mg via INTRAVENOUS
  Administered 2022-06-27 (×3): 1 mg via INTRAVENOUS
  Administered 2022-06-27: .5 mg via INTRAVENOUS
  Administered 2022-06-27 (×2): 1 mg via INTRAVENOUS

## 2022-06-27 NOTE — TOC Progression Note (Addendum)
Transition of Care [facility]) - Progression Note    Patient Details  Name: WALSIE REEN MRN: [id number] Date of Birth: Apr 30, 1945  Transition of Care [facility]) CM/SW Contact  Larrie Kass, LCSW Phone Number: 06/27/2022, 11:49 AM  Clinical Narrative:     CSW to start insurance auth close to medical stability. TOC to follow.  Adden 3:00pm  CSW received message from pt's RN, pt received call back from [facility] and would like CSW to follow up. CSW spoke with Francisco DON at [facility] he reports to send pt's information out again. He reports he will review pt again. TOC to follow.    Expected Discharge Plan: Skilled Nursing Facility Barriers to Discharge: Continued Medical Work up  Expected Discharge Plan and Services       Living arrangements for the past 2 months: Single Family Home                                       Social Determinants of Health (SDOH) Interventions SDOH Screenings   Food Insecurity: No Food Insecurity (06/22/2022)  Housing: Low Risk  (06/22/2022)  Transportation Needs: No Transportation Needs (06/22/2022)  Utilities: Not At Risk (06/22/2022)  Depression (PHQ2-9): Low Risk  (05/23/2018)  Financial Resource Strain: Low Risk  (07/01/2020)  Tobacco Use: Low Risk  (06/21/2022)    Readmission Risk Interventions     No data to display

## 2022-06-27 NOTE — Progress Notes (Signed)
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 MRI of the lumbar spine showed L3 vertebral fracture, acute, approximately 25% central vertebral body height loss. L4-L5, there is broad-based disc bulge with moderate lateral recess stenosis bilaterally. L5-S1, disc height loss and disc osteophyte complex with disc extrusion . Hospitalist called for admission, neurosurgery called in consult (Dr Dutch Quint).   Subjective: Seen after procedure, has improved pain in upper back but twinges in lower back now. On bedrest currently. Hungry. Completely supine and has no increase in her chronic shortness of breath.  Objective: BP (!) 144/88   Pulse 84   Temp (!) 97.5 F (36.4 C) (Oral)   Resp 17   Ht 5\' 7"  (1.702 m)   Wt (!) 152.8 kg   SpO2 99%   BMI 52.77 kg/m   Gen: No distress Pulm: Clear, nonlabored  CV: RRR, no MRG, distant, stable from chronic 1+ pitting edema in legs. GI: Soft, NT, ND, +BS Neuro: Alert and oriented. No new focal deficits. Ext: Warm, no deformities Skin: No rashes, lesions or ulcers on visualized skin   Assessment & Plan: Acute L3 lumbar vertebral fracture (HCC) status post mechanical fall: MRI of the lumbar spine as above with L3 vertebral fractures, 25% central vertebral body height loss.  L4-L5 with broad based disc bulge with moderate lateral recess stenosis bilaterally, disc height loss and disc osteophyte complex with disc extrusion at L5-S1 -Neurosurgery recommended conservative management with PT OT and pain control.  - Planning kyphoplasty 6/3.  - Continue pain control - Plan for rehabilitation at SNF level of care.  patient hopeful to  have marked improvement and discharge home.   Chronic HFrEF, NICM: Echo done in August 2022 showing systolic dysfunction with EF 30 to 35%, and concurrent grade 1 diastolic dysfunction.  No indication to repeat echo at this time - Has chronic nonpitting pedal edema, clinically not volume overloaded - Continue Jardiance, Entresto, and spironolactone.  - EKG reviewed/first-degree AV block, continue Coreg, low-dose 3.125 mg twice daily     Incidental thyroid nodule: CT showed hypodense left lobe thyroid nodule measuring 2.7 x 2.5 cm. TSH 2.9 - Recommended nonemergent thyroid ultrasound on an outpatient basis.    History of PVCs - Continue mexiletine.   Hypertension -Continue Entresto, Aldactone, resumed low-dose Coreg -First-degree AV block noted on the EKG, if worsens, will stop/reduce Coreg dose -Currently well-controlled, will continue monitoring for orthostasis.   Hyperlipidemia - Continue Lipitor.   Anxiety and depression - Continue bupropion and duloxetine.   RLS - Continue Mirapex.   Tyrone Nine, MD [facility] [email or web address] 06/27/2022, 1:05 PM

## 2022-06-27 NOTE — Care Management Important Message (Signed)
Important Message  Patient Details IM Letter given. Name: BRITTISH PURVIANCE MRN: [id number] Date of Birth: [birth year]   Medicare Important Message Given:  Yes     Caren Macadam 06/27/2022, 11:48 AM

## 2022-06-27 NOTE — Progress Notes (Signed)
OT Cancellation Note  Patient Details Name: Paige [name] MRN: [id number] DOB: 05/03/1945   Cancelled Treatment:    Reason Eval/Treat Not Completed: Pain limiting ability to participate Patient declined reporting having had a rough night with increased pain and IV access issues. Patient asking for therapy to check back after procedure. OT to continue to follow and check back as schedule will allow.  Rosalio Loud, MS Acute Rehabilitation Department Office# [phone]  06/27/2022, 8:03 AM

## 2022-06-27 NOTE — Plan of Care (Signed)

## 2022-06-27 NOTE — Progress Notes (Signed)
PT Cancellation Note  Patient Details Name: Paige [name] MRN: [id number] DOB: 1945-03-19   Cancelled Treatment:    Reason Eval/Treat Not Completed: Medical issues which prohibited therapy  Spoke with PTA, pt for kyphoplasty today and reports bedrest for 3 hr.  Will f/u later date. Anise Salvo, PT Acute Rehab [facility] [phone]  Rayetta Humphrey 06/27/2022, 4:27 PM

## 2022-06-28 DIAGNOSIS — S32039A Unspecified fracture of third lumbar vertebra, initial encounter for closed fracture: Secondary | ICD-10-CM | POA: Diagnosis not present

## 2022-06-28 MED ORDER — ORAL CARE MOUTH RINSE: 15.0000 mL | OROMUCOSAL | Status: DC | PRN

## 2022-06-28 NOTE — Progress Notes (Signed)
Patient ID: Paige [name], female   DOB: May 17, 1945, 77 y.o.   MRN: [id number]   Referring Physician(s): Rai,R  Supervising Physician: Mir, Mauri Reading  Patient Status:  [facility] - In-pt  Chief Complaint:  Back pain, L3 compression fracture  Subjective: Pt states that her back pain has significantly improved post L3 KP yesterday; now able to move around without pain exacerbation; no new neuro changes, afebrile    Past Medical History:  Diagnosis Date   Acute exacerbation of CHF (congestive heart failure) (HCC) 05/04/2020   Acute respiratory failure with hypoxia (HCC) 05/08/2020   Anemia    Anxiety disorder 05/04/2020   Arthritis    Arthritis of knee 09/25/2013   Arthritis of knee, left 09/25/2013   Arthritis of right knee 02/10/2014   Back pain    Chronic kidney disease, stage 3a (HCC) 05/04/2020   Class 3 severe obesity with serious comorbidity and body mass index (BMI) of 45.0 to 49.9 in adult (HCC) 03/29/2018   Congestive heart failure (HCC) 06/01/2021   Depression    Elevated troponin 05/08/2020   Esophageal reflux 05/04/2020   Essential hypertension 05/08/2020   Fatigue    Gallbladder problem    H/O hiatal hernia    Hearing loss 06/01/2021   High cholesterol 05/04/2020   Hyperlipidemia 05/08/2020   Insomnia 06/01/2021   Insulin resistance 03/29/2018   Interscapular region contusion 06/01/2021   Joint pain    Lymphedema of both lower extremities    Morbid obesity with BMI of 50.0-59.9, adult (HCC) 05/08/2020   Muscle pain 06/01/2021   Obesity    Osteoarthritis    Other long term (current) drug therapy 06/01/2021   Palpitations 11/24/2020   Perennial allergic rhinitis 01/10/2018   Pernicious anemia 06/01/2021   PONV (postoperative nausea and vomiting)    after Cholecysectomy   Prediabetes 06/01/2021   Primary osteoarthritis of left hip 11/26/2015   Primary osteoarthritis of right knee 02/09/2014   Recurrent major depression (HCC) 06/01/2021   Restless leg syndrome 05/08/2020   Rhinitis  medicamentosa 01/10/2018   Sleep disturbance 06/01/2021   Systolic and diastolic CHF, acute (HCC) 05/08/2020   Tremor 06/01/2021   Urinary incontinence 06/01/2021   Vasomotor rhinitis 01/10/2018   Vertigo    Vitamin B 12 deficiency    Vitamin D deficiency 03/29/2018   Past Surgical History:  Procedure Laterality Date   ABDOMINAL HYSTERECTOMY     CHOLECYSTECTOMY     COLONOSCOPY     EYE SURGERY Bilateral    cataracts   FRACTURE SURGERY Right    arm age 70   FRACTURE SURGERY Bilateral    both thumbs   IR KYPHO LUMBAR INC FX REDUCE BONE BX UNI/BIL CANNULATION INC/IMAGING  06/27/2022   LAPAROSCOPIC GASTRIC BANDING     2010   OVARIAN CYST REMOVAL     RIGHT/LEFT HEART CATH AND CORONARY ANGIOGRAPHY N/A 05/07/2020   Procedure: RIGHT/LEFT HEART CATH AND CORONARY ANGIOGRAPHY;  Surgeon: Orpah Cobb, MD;  Location: [facility] INVASIVE CV LAB;  Service: Cardiovascular;  Laterality: N/A;   TOTAL HIP ARTHROPLASTY Left 11/30/2015   Procedure: TOTAL HIP ARTHROPLASTY ANTERIOR APPROACH;  Surgeon: Gean Birchwood, MD;  Location: [facility] OR;  Service: Orthopedics;  Laterality: Left;   TOTAL KNEE ARTHROPLASTY Left 09/25/2013   Procedure: TOTAL KNEE ARTHROPLASTY;  Surgeon: Nestor Lewandowsky, MD;  Location: [facility] OR;  Service: Orthopedics;  Laterality: Left;   TOTAL KNEE ARTHROPLASTY Right 02/10/2014   dr Turner Daniels   TOTAL KNEE ARTHROPLASTY Right 02/10/2014   Procedure: TOTAL KNEE  ARTHROPLASTY;  Surgeon: Nestor Lewandowsky, MD;  Location: [facility] OR;  Service: Orthopedics;  Laterality: Right;      Allergies: No known allergies  Medications: Prior to Admission medications   Medication Sig Start Date End Date Taking? Authorizing Provider  aspirin 81 MG chewable tablet Chew 81 mg by mouth daily.   Yes [provider]  atorvastatin (LIPITOR) 40 MG tablet Take 1 tablet (40 mg total) by mouth daily. 05/08/20  Yes Drema Dallas, MD  buPROPion (WELLBUTRIN XL) 150 MG 24 hr tablet Take 150 mg by mouth every morning. 09/10/21  Yes [provider]  carvedilol (COREG) 6.25 MG tablet Take 1 tablet (6.25 mg total) by mouth 2 (two) times daily with a meal. 01/19/22  Yes Revankar, Aundra Dubin, MD  Cyanocobalamin (VITAMIN B 12 PO) Take 1 tablet by mouth daily.   Yes [provider]  DULoxetine (CYMBALTA) 60 MG capsule Take 60 mg by mouth daily. 10/01/21  Yes [provider]  JARDIANCE 25 MG TABS tablet Take 25 mg by mouth daily. 05/09/21  Yes [provider]  mexiletine (MEXITIL) 250 MG capsule TAKE 1 CAPSULE(250 MG) BY MOUTH TWICE DAILY 06/13/22  Yes Camnitz, Will Daphine Deutscher, MD  Phenylephrine HCl (SINEX REGULAR NA) Place 1 spray into both nostrils daily as needed for congestion (congestion).   Yes [provider]  pramipexole (MIRAPEX) 0.25 MG tablet Take 0.25-0.5 mg by mouth 2 (two) times daily. Take 1 tablet (0.25 mg) in the morning and Take 2 tablets (0.5 mg) at bedtime   Yes [provider]  sacubitril-valsartan (ENTRESTO) 24-26 MG Take 1 tablet by mouth 2 (two) times daily. 12/27/21  Yes Revankar, Aundra Dubin, MD  spironolactone (ALDACTONE) 25 MG tablet Take 0.5 tablets (12.5 mg total) by mouth daily. 01/18/22  Yes Revankar, Aundra Dubin, MD  zolpidem (AMBIEN) 10 MG tablet Take 10 mg by mouth at bedtime. 05/26/21  Yes [provider]  furosemide (LASIX) 40 MG tablet Take 40 mg by mouth daily as needed for fluid or edema.    [provider]     Vital Signs: BP 117/64 (BP Location: Right Arm)   Pulse 84   Temp 98.4 F (36.9 C) (Oral)   Resp 17   Ht 5\' 7"  (1.702 m)   Wt (!) 336 lb 14.4 oz (152.8 kg)   SpO2 96%   BMI 52.77 kg/m   Physical Exam awake/alert; puncture sites L3 region clean and dry, mild ecchymosis, NT; pt moving all fours ok  Imaging: IR KYPHO LUMBAR INC FX REDUCE BONE BX UNI/BIL CANNULATION INC/IMAGING  Result Date: 06/27/2022 INDICATION: L3 compression fracture EXAM: L3 vertebral body augmentation kyphoplasty COMPARISON:  None Available. MEDICATIONS: Documented  EMR ANESTHESIA/SEDATION: Moderate (conscious) sedation was employed during this procedure. A total of Versed 6 mg and Fentanyl 200 mcg was administered intravenously by the radiology nurse. Total intra-service moderate Sedation Time: 48 minutes. The patient's level of consciousness and vital signs were monitored continuously by radiology nursing throughout the procedure under my direct supervision. FLUOROSCOPY: Radiation Exposure Index (as provided by the fluoroscopic device): 7.1 minutes (680 mGy) COMPLICATIONS: None immediate. PROCEDURE: Informed written consent was obtained from the patient after a thorough discussion of the procedural risks, benefits and alternatives. All questions were addressed. Maximal Sterile Barrier Technique was utilized including caps, mask, sterile gowns, sterile gloves, sterile drape, hand hygiene and skin antiseptic. A timeout was performed prior to the initiation of the procedure. The patient was positioned prone on the exam table.  A bipedicular access was planned. Skin entry site(s) were marked overlying the L3 vertebral body using fluoroscopy. The overlying skin was then prepped and draped in the standard sterile fashion. Local analgesia was obtained with 1% lidocaine. Attention was first turned to the right side. Under fluoroscopic guidance, a 10 gauge introducer needle was advanced towards the lateral margin of the pedicle. Using multiple projections, the introducer needle was advanced towards the posterior margin of the vertebral body via a transpedicular approach. The inner needle was then removed, and the drill was advanced towards the anterior margin of the vertebral body. Attention was then turned to the contralateral side. Under fluoroscopic guidance, a 10 gauge introducer needle was advanced towards the lateral margin of the pedicle. Using multiple projections, the introducer needle was advanced towards the posterior margin of the vertebral body via a transpedicular  approach. The inner needle was then removed, and the drill was advanced towards the anterior margin of the vertebral body. The Kyphon 15 mm inflatable bone tamps were then advanced through the bilateral transpedicular access needles and positioned within the mid vertebral body. Kyphoplasty was then performed, ensuring that the balloon contours stayed within the vertebral body margins. The balloons were then deflated and removed, followed by advancement of the bone filler devices bilaterally and the instillation of acrylic bone cement with excellent filling in the AP and lateral projections. No extravasation was noted in the disk spaces or posteriorly into the spinal canal. No epidural venous contamination was seen. At the end of the procedure, the introducer cannulas and bone filler devices were then removed without difficulty. Clean dressings were placed after hemostasis. The patient tolerated all aspects of the procedure well, and was transferred to recovery in stable condition. IMPRESSION: 1. Successful L3 vertebral body augmentation using kyphoplasty. If the patient has known osteoporosis, recommend treatment as clinically indicated. If the patient's bone density status is unknown, DEXA scan is recommended. Electronically Signed   By: Olive Bass M.D.   On: 06/27/2022 14:41    Labs:  CBC: Recent Labs    06/21/22 1741 06/23/22 0441 06/25/22 0511 06/27/22 0426  WBC 10.1 9.3 8.7 7.2  HGB 15.0 15.3* 14.5 14.5  HCT 47.5* 48.6* 46.6* 46.2*  PLT 208 162 182 196    COAGS: Recent Labs    06/23/22 0750  INR 1.1    BMP: Recent Labs    06/21/22 1741 06/23/22 0441 06/24/22 0444 06/25/22 0657  NA 138 135 137 135  K 4.6 4.6 5.1 4.1  CL 104 100 102 99  CO2 29 28 30 29   GLUCOSE 95 121* 125* 110*  BUN 21 16 18 17   CALCIUM 8.7* 8.5* 8.7* 8.6*  CREATININE 0.89 0.95 0.84 0.80  GFRNONAA >60 >60 >60 >60    LIVER FUNCTION TESTS: Recent Labs    06/21/22 1741  BILITOT 0.8  AST 26  ALT 25   ALKPHOS 142*  PROT 6.6  ALBUMIN 3.5    Assessment and Plan: Pt with hx symptomatic L3 compression fracture, s/p L3 KP 06/27/22; currently stable with sig decrease in back pain; rec PT eval; other plans as per [facility]   Electronically Signed: D. Jeananne Rama, PA-C 06/28/2022, 11:23 AM   I spent a total of 15 Minutes at the the patient's bedside AND on the patient's hospital floor or unit, greater than 50% of which was counseling/coordinating care for lumbar 3 kyphoplasty

## 2022-06-28 NOTE — Progress Notes (Signed)
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 MRI of the lumbar spine showed L3 vertebral fracture, acute, approximately 25% central vertebral body height loss. L4-L5, there is broad-based disc bulge with moderate lateral recess stenosis bilaterally. L5-S1, disc height loss and disc osteophyte complex with disc extrusion . Hospitalist called for admission, neurosurgery called in consult (Dr Dutch Quint). Pain control was continued, ultimately had kyphoplasty 6/3.   Subjective: Needed IV dilaudid a few times in the night for pain that made her cry out. Overall that was better than any previous night, and she's able to move better, up to chair today with some pain that is improved with oxycodone. She has pain in the right lower back not traveling down leg, no numbness or weakness. Had normal BM yesterday evening.   Objective: BP 117/64 (BP Location: Right Arm)   Pulse 84   Temp 98.4 F (36.9 C) (Oral)   Resp 17   Ht 5\' 7"  (1.702 m)   Wt (!) 152.8 kg   SpO2 96%   BMI 52.77 kg/m   Gen: No distress sitting in chair Pulm: Clear, nonlabored  CV: RRR, no MRG, stable 1+ edema in legs GI: Soft, NT, ND, hypoactive BS Neuro: Alert and oriented. No new focal deficits. MSK: Paraspinal puncta at L3 with limited bruising, no active discharge. Inferior on the right side is tenderness without visible abnormality or palpable deformity, mild paraspinal muscle spasm is noted. Skin: No fluctuance or large ecchymoses. No other rashes, lesions or ulcers on visualized skin   Assessment & Plan: Acute L3 lumbar vertebral fracture (HCC) status post mechanical fall: MRI of the lumbar spine  as above with L3 vertebral fractures, 25% central vertebral body height loss.  L4-L5 with broad based disc bulge with moderate lateral recess stenosis bilaterally, disc height loss and disc osteophyte complex with disc extrusion at L5-S1 -Neurosurgery recommended conservative management with PT OT and pain control.  - S/p L3 kyphoplasty 6/3.  - Continue pain control, include lidocaine patch and antispasmodic. Lean on oral oxycodone, though she has required IV dilaudid several times in past 24 hours. We will attempt to wean off IV prior to discharge.  - Plan for rehabilitation at SNF level of care, accepted to [facility].    Chronic HFrEF, NICM: Echo done in August 2022 showing systolic dysfunction with EF 30 to 35%, and concurrent grade 1 diastolic dysfunction.  No indication to repeat echo at this time - Has chronic nonpitting pedal edema, clinically not volume overloaded - Continue Jardiance, Entresto, and spironolactone.  - EKG reviewed/first-degree AV block, continue Coreg, low-dose 3.125 mg twice daily     Incidental thyroid nodule: CT showed hypodense left lobe thyroid nodule measuring 2.7 x 2.5 cm. TSH 2.9 - Recommended nonemergent thyroid ultrasound on an outpatient basis.    History of PVCs - Continue mexiletine.   Hypertension -Continue Entresto, Aldactone, resumed low-dose Coreg -First-degree AV block noted on the EKG, if worsens, will stop/reduce Coreg dose -Currently well-controlled, will continue monitoring for orthostasis. VSS   Hyperlipidemia - Continue Lipitor.   Anxiety and depression - Continue bupropion and duloxetine.   RLS - Continue Mirapex.   Tyrone Nine, MD [facility] [email or web address] 06/28/2022, 12:07 PM

## 2022-06-28 NOTE — Progress Notes (Signed)
Physical Therapy Treatment Patient Details Name: JAYDAN VIRDEN MRN: [id number] DOB: 14-Jul-1945 Today's Date: 06/28/2022   History of Present Illness Ms. Milan is a 77 yr old female who had a mechanical fall with concern for back injury. Imaging revealed L3 vertebral fracture, acute, approximately 25% central vertebral body height loss.  L4-L5, there is broad-based disc bulge with moderate lateral recess stenosis bilaterally.  L5-S1, disc height loss and disc osteophyte complex with disc extrusion. Neurosurgery recommended conservative management and no back brace. IR consult pending for possible vertebroplasty. JXB:JYNWGNF systolic CHF due to nonischemic cardiomyopathy, PVCs, HTN, HLP, TIA, obesity, anxiety, GERD, osteoarthritis, RLS    PT Comments     Pt admitted with above diagnosis.  Pt currently with functional limitations due to the deficits listed below (see PT Problem List). Pt in bed when PT arrived. Pt indicated the pain in her back is gone, however she continues to have R side hip and buttock pain and slightly in her low back. Pt stated her pain is 7/10 at rest and 8/10 with mobility. Pt agreeable to getting OOB. Pt required use of hospital bed and min A for supine to sit, pt required min A for STS from elevated EOB with cues for proper UE placement. Pt required min guard to complete SPT bed to recliner, absent eccentric control to recliner. Pt indicated she was feeling a little nauseated at hot during the course of the transfer tasks. Pt encouraged to remain sitting up and ed provided on benefits of increased time OOB including regulating Bp and improving activity tolerance and core stability. Pt indicated she sustained her fall when bending over to pick up packages on the porch and sometimes feels light headed with change in levels, pt may benefit from assessment for orthostatic hypotension. Pt ed provided on back precautions. Pt left seated in recliner all needs in place. Pt indicated she  felt like she may be able to transition home with [facility] vs SNF/Rehab. Pt however declined trials with ambulation today due to nausea, feeling hot and generalized weakness. Pt will benefit from acute skilled PT to increase their independence and safety with mobility to allow discharge.     Recommendations for follow up therapy are one component of a multi-disciplinary discharge planning process, led by the attending physician.  Recommendations may be updated based on patient status, additional functional criteria and insurance authorization.  Follow Up Recommendations  Can patient physically be transported by private vehicle: No    Assistance Recommended at Discharge Frequent or constant Supervision/Assistance  Patient can return home with the following Two people to help with walking and/or transfers;Two people to help with bathing/dressing/bathroom;Assistance with cooking/housework;Assist for transportation;Help with stairs or ramp for entrance   Equipment Recommendations  None recommended by PT    Recommendations for Other Services       Precautions / Restrictions Precautions Precautions: Fall;Back Restrictions Weight Bearing Restrictions: No     Mobility  Bed Mobility Overal bed mobility: Needs Assistance Bed Mobility: Supine to Sit Rolling: Min guard   Supine to sit: Min assist     General bed mobility comments: cues for log roll technique and ed provided on back precuations t/o intervention, increased time, use of hospital bed    Transfers Overall transfer level: Needs assistance Equipment used: Rolling walker (2 wheels) Transfers: Sit to/from Stand, Bed to chair/wheelchair/BSC Sit to Stand: Min assist Stand pivot transfers: Min guard (and cues, absent eccntric control to recliner)         General  transfer comment: cues for proper UE placement    Ambulation/Gait               General Gait Details: NT   Stairs             Wheelchair Mobility     Modified Rankin (Stroke Patients Only)       Balance Overall balance assessment: Needs assistance Sitting-balance support: Bilateral upper extremity supported, Feet supported Sitting balance-Leahy Scale: Fair Sitting balance - Comments: L Lateral lean in sitting with pt requesting pillow under L UE to support   Standing balance support: Bilateral upper extremity supported, During functional activity Standing balance-Leahy Scale: Poor                              Cognition Arousal/Alertness: Awake/alert Behavior During Therapy: WFL for tasks assessed/performed Overall Cognitive Status: Within Functional Limits for tasks assessed                                          Exercises      General Comments General comments (skin integrity, edema, etc.): B LE pitting edema, pt is now S/P L 3 kyphoplasty 06/27/2022      Pertinent Vitals/Pain Pain Assessment Pain Assessment: 0-10 Pain Score: 8  Pain Location: right lower back, hip and buttocks Pain Descriptors / Indicators: Spasm, Sore Pain Intervention(s): Limited activity within patient's tolerance, Monitored during session, Repositioned    Home Living                          Prior Function            PT Goals (current goals can now be found in the care plan section) Acute Rehab PT Goals Patient Stated Goal: to go home, no pain PT Goal Formulation: With patient Time For Goal Achievement: 07/06/22 Potential to Achieve Goals: Fair Progress towards PT goals: Progressing toward goals    Frequency    Min 1X/week      PT Plan Current plan remains appropriate    Co-evaluation              AM-PAC PT "6 Clicks" Mobility   Outcome Measure  Help needed turning from your back to your side while in a flat bed without using bedrails?: A Little Help needed moving from lying on your back to sitting on the side of a flat bed without using bedrails?: A Little Help needed moving  to and from a bed to a chair (including a wheelchair)?: A Little Help needed standing up from a chair using your arms (e.g., wheelchair or bedside chair)?: A Little Help needed to walk in hospital room?: Total Help needed climbing 3-5 steps with a railing? : Total 6 Click Score: 14    End of Session Equipment Utilized During Treatment: Gait belt Activity Tolerance: Patient limited by fatigue;Other (comment);Patient limited by pain (reported feeling nauseated) Patient left: in chair;with call bell/phone within reach Nurse Communication: Mobility status PT Visit Diagnosis: Difficulty in walking, not elsewhere classified (R26.2);History of falling (Z91.81);Pain Pain - Right/Left: Right Pain - part of body: Hip     Time: 8295-6213 PT Time Calculation (min) (ACUTE ONLY): 26 min  Charges:  $Therapeutic Activity: 23-37 mins  Johnny Bridge, PT Acute Rehab    Jacqualyn Posey 06/28/2022, 12:29 PM

## 2022-06-28 NOTE — TOC Progression Note (Addendum)
Transition of Care [facility]) - Progression Note    Patient Details  Name: Paige [name] MRN: [id number] Date of Birth: 12/06/1945  [facility]) CM/SW Contact  Larrie Kass, LCSW Phone Number: 06/28/2022, 9:27 AM  Clinical Narrative:     [facility] still declined pt, CSW provided pt with three additional bed offers. Pt requesting time to review new beds. TOC to follow.   [place]  11:20am  CSW received call from [place] DON at [facility], he reports the facility had made a mistake and they are able to offer pt a bed. Pt was informed about 43 dollars a day fee. Pt has accepted bed offer. CSW will start insurance auth close to pt being medically stable. TOC to follow.    Expected Discharge Plan: Skilled Nursing Facility Barriers to Discharge: Continued Medical Work up  Expected Discharge Plan and Services       Living arrangements for the past 2 months: Single Family Home                                       Social Determinants of Health (SDOH) Interventions SDOH Screenings   Food Insecurity: No Food Insecurity (06/22/2022)  Housing: Low Risk  (06/22/2022)  Transportation Needs: No Transportation Needs (06/22/2022)  Utilities: Not At Risk (06/22/2022)  Depression (PHQ2-9): Low Risk  (05/23/2018)  Financial Resource Strain: Low Risk  (07/01/2020)  Tobacco Use: Low Risk  (06/27/2022)    Readmission Risk Interventions     No data to display

## 2022-06-28 NOTE — Progress Notes (Signed)
Occupational Therapy Treatment Patient Details Name: Paige [name] MRN: [id number] DOB: 1945/08/13 Today's Date: 06/28/2022   History of present illness Ms. Barbella is a 77 yr old female who had a mechanical fall with concern for back injury. Imaging revealed L3 vertebral fracture, acute, approximately 25% central vertebral body height loss.  L4-L5, there is broad-based disc bulge with moderate lateral recess stenosis bilaterally.  L5-S1, disc height loss and disc osteophyte complex with disc extrusion. Pt is s/p a kyphoplasty on 06-27-2022. WUJ:WJXBJYN systolic CHF due to nonischemic cardiomyopathy, PVCs, HTN, HLP, TIA, obesity, anxiety, GERD, osteoarthritis, RLS   OT comments  The pt reported much improved back pain since undergoing a kyphoplasty yesterday. She does however reported having 4/10 R hip pain, which radiates down to her knee, as well as intermittent tingling of her bilateral toes. She gently deferred out of bed activity, as she was up in the chair for over an hour earlier today. She was subsequently instructed on bed mobility, repositioning in bed, and simple stretches/exercises at bed level; she presented with good recall and teach back abilities. She will continue to benefit from further OT services to facilitate progressive ADL performance and to decrease the risk for restricted participation in meaningful activities.    Recommendations for follow up therapy are one component of a multi-disciplinary discharge planning process, led by the attending physician.  Recommendations may be updated based on patient status, additional functional criteria and insurance authorization.    Assistance Recommended at Discharge Frequent or constant Supervision/Assistance  Patient can return home with the following  A lot of help with bathing/dressing/bathroom;Assistance with cooking/housework;Assist for transportation;Help with stairs or ramp for entrance;A lot of help with walking and/or transfers    Equipment Recommendations  Other (comment) (to be determined pending progress at next setting)       Precautions / Restrictions Precautions Precautions: Fall Precaution Comments: back for comfort Restrictions Weight Bearing Restrictions: No       Mobility Bed Mobility Overal bed mobility: Needs Assistance Bed Mobility: Rolling Rolling: Min guard         General bed mobility comments: The pt was also instructed on scooting to the head of the bed, for which she required cues to bend her knees and use bed rail as needed    Transfers        General transfer comment: the pt gently deferred         ADL either performed or assessed with clinical judgement   ADL Overall ADL's : Needs assistance/impaired Eating/Feeding: Independent;Bed level   Grooming: Set up;Bed level           Upper Body Dressing : Set up Upper Body Dressing Details (indicate cue type and reason): at bed level, based on clinical judgement Lower Body Dressing: Maximal assistance                       Vision Baseline Vision/History: 1 Wears glasses            Cognition Arousal/Alertness: Awake/alert Behavior During Therapy: WFL for tasks assessed/performed Overall Cognitive Status: Within Functional Limits for tasks assessed                       General Comments B LE pitting edema, pt is now S/P L 3 kyphoplasty 06/27/2022    Pertinent Vitals/ Pain       Pain Assessment Pain Assessment: 0-10 Pain Score: 4  Pain Location:  (R hip radiating down  to knee) Pain Descriptors / Indicators: Aching Pain Intervention(s): Limited activity within patient's tolerance, Monitored during session         Frequency  Min 1X/week        Progress Toward Goals  OT Goals(current goals can now be found in the care plan section)  Progress towards OT goals: Progressing toward goals  Acute Rehab OT Goals Patient Stated Goal: to go to rehab then return home OT Goal Formulation: With  patient Time For Goal Achievement: 07/06/22 Potential to Achieve Goals: Good  Plan Discharge plan remains appropriate       AM-PAC OT "6 Clicks" Daily Activity     Outcome Measure   Help from another person eating meals?: None Help from another person taking care of personal grooming?: A Little Help from another person toileting, which includes using toliet, bedpan, or urinal?: A Lot Help from another person bathing (including washing, rinsing, drying)?: A Lot Help from another person to put on and taking off regular upper body clothing?: A Little Help from another person to put on and taking off regular lower body clothing?: A Lot 6 Click Score: 16    End of Session Equipment Utilized During Treatment: Other (comment) (none)  OT Visit Diagnosis: Pain;History of falling (Z91.81)   Activity Tolerance Patient limited by pain   Patient Left in bed;with call bell/phone within reach;with bed alarm set   Nurse Communication Other (comment)        Time: 1610-9604 OT Time Calculation (min): 20 min  Charges: OT General Charges $OT Visit: 1 Visit OT Treatments $Therapeutic Activity: 8-22 mins     Reuben Likes, OTR/L 06/28/2022, 3:52 PM

## 2022-06-29 DIAGNOSIS — I130 Hypertensive heart and chronic kidney disease with heart failure and stage 1 through stage 4 chronic kidney disease, or unspecified chronic kidney disease: Secondary | ICD-10-CM | POA: Diagnosis not present

## 2022-06-29 DIAGNOSIS — I10 Essential (primary) hypertension: Secondary | ICD-10-CM | POA: Diagnosis not present

## 2022-06-29 DIAGNOSIS — N1831 Chronic kidney disease, stage 3a: Secondary | ICD-10-CM | POA: Diagnosis not present

## 2022-06-29 DIAGNOSIS — S32039A Unspecified fracture of third lumbar vertebra, initial encounter for closed fracture: Secondary | ICD-10-CM | POA: Diagnosis not present

## 2022-06-29 DIAGNOSIS — I493 Ventricular premature depolarization: Secondary | ICD-10-CM | POA: Diagnosis not present

## 2022-06-29 DIAGNOSIS — S32039D Unspecified fracture of third lumbar vertebra, subsequent encounter for fracture with routine healing: Secondary | ICD-10-CM | POA: Diagnosis not present

## 2022-06-29 DIAGNOSIS — M1990 Unspecified osteoarthritis, unspecified site: Secondary | ICD-10-CM | POA: Diagnosis not present

## 2022-06-29 DIAGNOSIS — R7303 Prediabetes: Secondary | ICD-10-CM | POA: Diagnosis not present

## 2022-06-29 DIAGNOSIS — I428 Other cardiomyopathies: Secondary | ICD-10-CM | POA: Diagnosis not present

## 2022-06-29 DIAGNOSIS — Y9301 Activity, walking, marching and hiking: Secondary | ICD-10-CM | POA: Diagnosis not present

## 2022-06-29 DIAGNOSIS — D649 Anemia, unspecified: Secondary | ICD-10-CM | POA: Diagnosis not present

## 2022-06-29 DIAGNOSIS — Y92018 Other place in single-family (private) house as the place of occurrence of the external cause: Secondary | ICD-10-CM | POA: Diagnosis not present

## 2022-06-29 DIAGNOSIS — E041 Nontoxic single thyroid nodule: Secondary | ICD-10-CM | POA: Diagnosis not present

## 2022-06-29 DIAGNOSIS — F419 Anxiety disorder, unspecified: Secondary | ICD-10-CM | POA: Diagnosis not present

## 2022-06-29 DIAGNOSIS — F32A Depression, unspecified: Secondary | ICD-10-CM | POA: Diagnosis not present

## 2022-06-29 DIAGNOSIS — W010XXA Fall on same level from slipping, tripping and stumbling without subsequent striking against object, initial encounter: Secondary | ICD-10-CM | POA: Diagnosis not present

## 2022-06-29 DIAGNOSIS — G2581 Restless legs syndrome: Secondary | ICD-10-CM | POA: Diagnosis not present

## 2022-06-29 DIAGNOSIS — E785 Hyperlipidemia, unspecified: Secondary | ICD-10-CM | POA: Diagnosis not present

## 2022-06-29 DIAGNOSIS — R0902 Hypoxemia: Secondary | ICD-10-CM | POA: Diagnosis not present

## 2022-06-29 DIAGNOSIS — Z23 Encounter for immunization: Secondary | ICD-10-CM | POA: Diagnosis present

## 2022-06-29 DIAGNOSIS — I440 Atrioventricular block, first degree: Secondary | ICD-10-CM | POA: Diagnosis not present

## 2022-06-29 DIAGNOSIS — W19XXXD Unspecified fall, subsequent encounter: Secondary | ICD-10-CM | POA: Diagnosis not present

## 2022-06-29 DIAGNOSIS — I5022 Chronic systolic (congestive) heart failure: Secondary | ICD-10-CM | POA: Diagnosis not present

## 2022-06-29 DIAGNOSIS — K219 Gastro-esophageal reflux disease without esophagitis: Secondary | ICD-10-CM | POA: Diagnosis not present

## 2022-06-29 DIAGNOSIS — Z7401 Bed confinement status: Secondary | ICD-10-CM | POA: Diagnosis not present

## 2022-06-29 MED ORDER — CARVEDILOL 3.125 MG PO TABS: 3.1250 mg | ORAL_TABLET | Freq: Two times a day (BID) | ORAL | Status: DC

## 2022-06-29 MED ORDER — DOCUSATE SODIUM 100 MG PO CAPS: 100.0000 mg | ORAL_CAPSULE | Freq: Two times a day (BID) | ORAL | 0 refills | Status: DC

## 2022-06-29 MED ORDER — OXYCODONE HCL 5 MG PO TABS: 5.0000 mg | ORAL_TABLET | Freq: Four times a day (QID) | ORAL | 0 refills | Status: DC | PRN

## 2022-06-29 MED ORDER — POLYETHYLENE GLYCOL 3350 17 G PO PACK: 17.0000 g | PACK | Freq: Two times a day (BID) | ORAL | 0 refills | Status: DC

## 2022-06-29 MED ORDER — METHOCARBAMOL 1000 MG PO TABS: 1000.0000 mg | ORAL_TABLET | Freq: Three times a day (TID) | ORAL | Status: DC | PRN

## 2022-06-29 MED ORDER — LIDOCAINE 5 % EX PTCH: 1.0000 | MEDICATED_PATCH | CUTANEOUS | 0 refills | Status: DC

## 2022-06-29 MED ORDER — CARVEDILOL 3.125 MG PO TABS: 6.2500 mg | ORAL_TABLET | Freq: Two times a day (BID) | ORAL | Status: DC

## 2022-06-29 NOTE — TOC Transition Note (Signed)
Transition of Care [facility]) - CM/SW Discharge Note   Patient Details  Name: Paige [name] MRN: [id number] Date of Birth: May 10, 1945  Transition of Care [facility]) CM/SW Contact:  Larrie Kass, LCSW Phone Number: 06/29/2022, 10:49 AM   Clinical Narrative:    Pt's insurance [name] was approved.  Pt to d/c to [facility] , room assignment 112, call report [phone]. CSW informed pt of d/c . PTAR called . TOC sign off.      Barriers to Discharge: Continued Medical Work up   Patient Goals and CMS Choice      Discharge Placement                         Discharge Plan and Services Additional resources added to the After Visit Summary for                                       Social Determinants of Health (SDOH) Interventions SDOH Screenings   Food Insecurity: No Food Insecurity (06/22/2022)  Housing: Low Risk  (06/22/2022)  Transportation Needs: No Transportation Needs (06/22/2022)  Utilities: Not At Risk (06/22/2022)  Depression (PHQ2-9): Low Risk  (05/23/2018)  Financial Resource Strain: Low Risk  (07/01/2020)  Tobacco Use: Low Risk  (06/27/2022)     Readmission Risk Interventions     No data to display

## 2022-06-29 NOTE — Progress Notes (Signed)
AVS given to patient and explained at the bedside and over the phone with receiving nurse at [facility]. Medications and follow up appointments have been explained with pt and the pt's receiving nurse verbalizing understanding.

## 2022-06-29 NOTE — TOC Progression Note (Signed)
Transition of Care [facility]) - Progression Note    Patient Details  Name: Paige [name] MRN: [id number] Date of Birth: 08/19/1945  Transition of Care [facility]) CM/SW Contact  Larrie Kass, LCSW Phone Number: 06/29/2022, 10:26 AM  Clinical Narrative:    Pt's insurance [name] is pending. TOC to follow.    Expected Discharge Plan: Skilled Nursing Facility Barriers to Discharge: Continued Medical Work up  Expected Discharge Plan and Services       Living arrangements for the past 2 months: Single Family Home                                       Social Determinants of Health (SDOH) Interventions SDOH Screenings   Food Insecurity: No Food Insecurity (06/22/2022)  Housing: Low Risk  (06/22/2022)  Transportation Needs: No Transportation Needs (06/22/2022)  Utilities: Not At Risk (06/22/2022)  Depression (PHQ2-9): Low Risk  (05/23/2018)  Financial Resource Strain: Low Risk  (07/01/2020)  Tobacco Use: Low Risk  (06/27/2022)    Readmission Risk Interventions     No data to display

## 2022-06-29 NOTE — Discharge Summary (Addendum)
Physician Discharge Summary   Patient: Paige [name] MRN: [id number] DOB: 15-Jun-1945  Admit date:     06/21/2022  Discharge date: 06/29/22  Discharge Physician: Thad Ranger, MD    PCP: [name], MD   Recommendations at discharge:   Continue PT OT, pain control Outpatient follow-up with neurosurgery, Dr. 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  Vital signs stable on arrival.  Patient reported she was picking up packages in the front of her house and as she was walking back into the house, she lost her balance and fell backwards and injured her back.  No head injury.   MRI of the lumbar spine showed L3 vertebral fracture, acute, approximately 25% central vertebral body height loss.  L4-L5, there is broad-based disc bulge with moderate lateral recess stenosis bilaterally.  L5-S1, disc height loss and disc osteophyte complex with disc extrusion   Neurosurgery, Dr. Dutch Quint was consulted by EDP and recommended physical therapy and outpatient follow-up, no back brace.  Due to intractable pain, intervention radiology was consulted, underwent kyphoplasty on 06/27/2022  Assessment and Plan:  Acute L3 lumbar vertebral fracture (HCC) status post mechanical fall -MRI of the lumbar spine as above with L3 vertebral fractures, 25% central vertebral body height loss.  L4-L5 with broad based disc bulge with moderate lateral recess stenosis bilaterally, disc height  loss and disc osteophyte complex with disc extrusion at L5-S1 -Neurosurgery recommended conservative management with PT OT and pain control -Due to intractable pain, interventional neurology was consulted, underwent kyphoplasty on/3/24. -Continue pain control with oxycodone, Lidoderm patch, Tylenol, Robaxin -PT recommended SNF, patient lives alone -Outpatient follow-up with neurosurgery, Dr. Jordan Likes in 2 weeks   Chronic HFrEF due to nonischemic cardiomyopathy - Echo done in August 2022 showing EF 30 to 35%, G1 DD.   -Has chronic nonpitting pedal edema, currently no respiratory symptoms, no volume overload.   - Continue Coreg, Jardiance, Entresto, and spironolactone.  -EKG reviewed/first-degree AV block, continue Coreg, low-dose 3.125 mg twice daily     Incidental thyroid nodule -CT showed hypodense left lobe thyroid nodule measuring 2.7 x 2.5 cm, recommended nonemergent thyroid ultrasound on an outpatient basis.   -TSH 2.9     History of PVCs Continue mexiletine.   Hypertension -Continue Entresto, Aldactone, resumed low-dose Coreg  Hyperlipidemia Continue Lipitor.   Anxiety and depression Continue bupropion and duloxetine.   RLS Continue Mirapex.     Severe morbid obesity Estimated body mass index is 52.77 kg/m as calculated from the following:   Height as of this encounter: 5\' 7"  (1.702 m).   Weight as of this encounter: 152.8 kg.     Pain control - [employer] Controlled Substance Reporting System database was reviewed. and patient was instructed, not to drive, operate heavy machinery, perform activities at heights, swimming or participation in water activities or provide baby-sitting services while on Pain, Sleep and Anxiety Medications; until their outpatient Physician has advised to do so again. Also recommended to not to  take more than prescribed Pain, Sleep and Anxiety Medications.  Consultants: Neurosurgery, intervention radiology Procedures performed:  Kyphoplasty Disposition: Skilled nursing facility Diet recommendation:  Discharge Diet Orders (From admission, onward)     Start     Ordered   06/29/22 0000  Diet - low sodium heart healthy        06/29/22 1058            DISCHARGE MEDICATION: Allergies as of 06/29/2022       Reactions   No Known Allergies         Medication List     TAKE these medications    aspirin 81 MG chewable tablet Chew 81 mg by mouth daily.   atorvastatin 40 MG tablet Commonly known as: LIPITOR Take 1 tablet (40 mg total) by mouth daily.   buPROPion 150 MG 24 hr tablet Commonly known as: WELLBUTRIN XL Take 150 mg by mouth every morning.   carvedilol 3.125 MG tablet Commonly known as: COREG Take 1 tablet (3.125 mg total) by mouth 2 (two) times daily with a meal. What changed:  medication strength how much to take   docusate sodium 100 MG capsule Commonly known as: COLACE Take 1 capsule (100 mg total) by mouth 2 (two) times daily.   DULoxetine 60 MG capsule Commonly known as: CYMBALTA Take 60 mg by mouth daily.   Entresto 24-26 MG Generic drug: sacubitril-valsartan Take 1 tablet by mouth 2 (two) times daily.   furosemide 40 MG tablet Commonly known as: LASIX Take 40 mg by mouth daily as needed for fluid or edema.   Jardiance 25 MG Tabs tablet Generic drug: empagliflozin Take 25 mg by mouth daily.   lidocaine 5 % Commonly known as: LIDODERM Place 1 patch onto the skin daily. Remove & Discard patch within 12 hours or as directed by MD   Methocarbamol 1000 MG Tabs Take 1,000 mg by mouth every 8 (eight) hours as needed for muscle spasms.   mexiletine 250 MG capsule Commonly known as: MEXITIL TAKE 1 CAPSULE(250 MG) BY MOUTH TWICE DAILY   oxyCODONE 5 MG immediate release tablet Commonly known as: Oxy IR/ROXICODONE Take 1-2 tablets (5-10 mg total) by mouth every 6 (six) hours as needed for moderate pain or severe pain.   polyethylene glycol 17 g packet Commonly known as:  MIRALAX / GLYCOLAX Take 17 g by mouth 2 (two) times daily.   pramipexole 0.25 MG tablet Commonly known as: MIRAPEX Take 0.25-0.5 mg by mouth 2 (two) times daily. Take 1 tablet (0.25 mg) in the morning and Take 2 tablets (0.5 mg) at bedtime   SINEX REGULAR NA Place 1 spray into both nostrils daily as needed for congestion (congestion).   spironolactone 25 MG tablet Commonly known as: ALDACTONE Take 0.5 tablets (12.5 mg total) by mouth daily.   VITAMIN B 12 PO Take 1 tablet by mouth daily.   zolpidem 10 MG tablet Commonly known as: AMBIEN Take 10 mg by mouth at bedtime.        Contact information for follow-up providers     [name], MD. Schedule an appointment as soon as possible for a visit in 2 week(s).   Specialty: Family Medicine Why: for hospital follow-up Contact information: [address] [place] [address] [phone]         Julio Sicks, MD. Schedule an appointment as soon as possible for a visit in 2 week(s).   Specialty: Neurosurgery Why: for hospital follow-up Contact information: [address]  [address] [place] [address] [phone]              Contact information for after-discharge care     Destination     [facility] .   Service: Assisted Living Contact information: [address] [place] [address] [phone]                    Discharge Exam: Ceasar Mons Weights   06/22/22 0134  Weight: (!) 152.8 kg   S: No acute issues, looking forward to start rehab, pain controlled  BP 118/62   Pulse 81   Temp 98.3 F (36.8 C) (Oral)   Resp 18   Ht 5\' 7"  (1.702 m)   Wt (!) 152.8 kg   SpO2 97%   BMI 52.77 kg/m   Physical Exam General: Alert and oriented x 3, NAD Cardiovascular: S1 S2 clear, RRR.  Respiratory: CTAB, no wheezing, rales or rhonchi Gastrointestinal: Soft, nontender, nondistended, NBS Ext: no pedal edema bilaterally Neuro: no new  deficits Psych: Normal affect    Condition at discharge: fair  The results of significant diagnostics from this hospitalization (including imaging, microbiology, ancillary and laboratory) are listed below for reference.   Imaging Studies: IR KYPHO LUMBAR INC FX REDUCE BONE BX UNI/BIL CANNULATION INC/IMAGING  Result Date: 06/27/2022 INDICATION: L3 compression fracture EXAM: L3 vertebral body augmentation kyphoplasty COMPARISON:  None Available. MEDICATIONS: Documented EMR ANESTHESIA/SEDATION: Moderate (conscious) sedation was employed during this procedure. A total of Versed 6 mg and Fentanyl 200 mcg was administered intravenously by the radiology nurse. Total intra-service moderate Sedation Time: 48 minutes. The patient's level of consciousness and vital signs were monitored continuously by radiology nursing throughout the procedure under my direct supervision. FLUOROSCOPY: Radiation Exposure Index (as provided by the fluoroscopic device): 7.1 minutes (680 mGy) COMPLICATIONS: None immediate. PROCEDURE: Informed written consent was obtained from the patient after a thorough discussion of the procedural risks, benefits and alternatives. All questions were addressed. Maximal Sterile Barrier Technique was utilized including caps, mask, sterile gowns, sterile gloves, sterile drape, hand hygiene and skin antiseptic. A timeout was performed prior to the initiation of the procedure. The patient was positioned prone on the exam table. A bipedicular access was planned. Skin entry site(s) were marked overlying the L3 vertebral body using fluoroscopy. The overlying skin was then prepped and draped in the standard sterile fashion. Local analgesia was obtained with 1% lidocaine. Attention was first turned to the right side. Under fluoroscopic guidance, a 10 gauge introducer needle was advanced towards the lateral margin of the pedicle. Using multiple projections, the introducer needle was advanced towards the posterior  margin of the vertebral body via a transpedicular approach. The inner needle was then removed, and the drill was advanced towards the anterior margin of the vertebral body. Attention was then turned to the contralateral side. Under fluoroscopic guidance, a 10 gauge introducer needle was advanced towards the lateral margin of the pedicle. Using multiple projections, the introducer needle was advanced towards the posterior margin of the vertebral body via a transpedicular approach. The inner needle was then removed, and the drill was advanced towards the anterior margin of the vertebral body. The Kyphon 15 mm inflatable bone tamps were then advanced through the bilateral transpedicular access needles and positioned within the mid vertebral body. Kyphoplasty was then performed, ensuring that the balloon contours stayed within the vertebral body margins. The balloons were then deflated and removed, followed by advancement of the bone filler devices bilaterally and  the instillation of acrylic bone cement with excellent filling in the AP and lateral projections. No extravasation was noted in the disk spaces or posteriorly into the spinal canal. No epidural venous contamination was seen. At the end of the procedure, the introducer cannulas and bone filler devices were then removed without difficulty. Clean dressings were placed after hemostasis. The patient tolerated all aspects of the procedure well, and was transferred to recovery in stable condition. IMPRESSION: 1. Successful L3 vertebral body augmentation using kyphoplasty. If the patient has known osteoporosis, recommend treatment as clinically indicated. If the patient's bone density status is unknown, DEXA scan is recommended. Electronically Signed   By: Olive Bass M.D.   On: 06/27/2022 14:41   MR LUMBAR SPINE WO CONTRAST  Result Date: 06/21/2022 CLINICAL DATA:  Ataxia, lumbar trauma.  Back pain. EXAM: MRI LUMBAR SPINE WITHOUT CONTRAST TECHNIQUE:  Multiplanar, multisequence MR imaging of the lumbar spine was performed. No intravenous contrast was administered. COMPARISON:  CT examination performed earlier on the same date. FINDINGS: Segmentation:   5 non rib-bearing lumbar type vertebral bodies are present. The lowest fully formed vertebral body is L5. Alignment:  Physiologic. Vertebrae: Marrow edema of the L3 vertebral body with irregularity of the superior and inferior endplates and mild retropulsion of the vertebral body into the spinal canal suggesting acute fracture. Approximately 25% central vertebral body height loss. No evidence of discitis. Hemangioma in the L1 vertebral body. Conus medullaris and cauda equina: Conus extends to the L1 level. Conus and cauda equina appear normal. Paraspinal and other soft tissues: Negative. Disc levels: T12-L1: No significant disc bulge. No neural foraminal stenosis. No central canal stenosis. L1-L2: No significant disc bulge. No neural foraminal stenosis. No central canal stenosis. L2-L3: No significant disc bulge. No neural foraminal stenosis. No central canal stenosis. Mild bilateral facet joint arthropathy. L3-L4: No significant disc bulge. No neural foraminal stenosis. No central canal stenosis. Mild bilateral facet joint arthropathy. L4-L5: Broad-based disc bulge with moderate lateral recess stenosis bilaterally. Ligamentum flavum hypertrophy with spinal canal stenosis. Mild bilateral facet joint arthropathy. No significant neural foraminal stenosis. L5-S1: Disc height loss and disc osteophyte complex with disc extrusion into bilateral lateral recesses. Moderate bilateral lateral recess stenosis. Moderate bilateral facet joint arthropathy. Mild right neural foraminal stenosis. IMPRESSION: 1. Marrow edema of the L3 vertebral body with irregularity of the superior and inferior endplates and mild retropulsion of the vertebral body into the spinal canal suggesting acute fracture. Approximately 25% central vertebral  body height loss. 2. At L4-5 there is a broad-based disc bulge with moderate lateral recess stenosis bilaterally. Ligamentum flavum hypertrophy with spinal canal stenosis. Mild bilateral facet joint arthropathy. No significant neural foraminal stenosis. 3. At L5-S1 there is disc height loss and disc osteophyte complex with disc extrusion into bilateral lateral recesses. Moderate bilateral lateral recess stenosis. Moderate bilateral facet joint arthropathy. Mild right neural foraminal stenosis. Electronically Signed   By: Larose Hires D.O.   On: 06/21/2022 23:13   CT Lumbar Spine Wo Contrast  Result Date: 06/21/2022 CLINICAL DATA:  Ataxia, lumbar trauma EXAM: CT LUMBAR SPINE WITHOUT CONTRAST TECHNIQUE: Multidetector CT imaging of the lumbar spine was performed without intravenous contrast administration. Multiplanar CT image reconstructions were also generated. RADIATION DOSE REDUCTION: This exam was performed according to the departmental dose-optimization program which includes automated exposure control, adjustment of the mA and/or kV according to patient size and/or use of iterative reconstruction technique. COMPARISON:  MRI 10/12/2014 FINDINGS: Segmentation: 5 lumbar type vertebrae. Alignment: Normal. Vertebrae:  There is an age indeterminate fracture of the L3 vertebral body involving both the superior and inferior endplate as well as the posterior wall with 4 mm retropulsion of the posteroinferior aspect of the L3 vertebral body multiple fracture planes are visible anteriorly suggesting that this fracture may be acute to subacute in nature. The posterior elements appear intact and there is no facet subluxation. No other fracture identified. Remaining vertebral body height is preserved. Paraspinal and other soft tissues: There is mild paravertebral infiltration in keeping with trace edema or hemorrhage. No loculated paraspinal fluid collection identified. Disc levels: There is intervertebral disc space  narrowing and endplate remodeling, most severe at L4-S1 in keeping with changes of moderate to severe degenerative disc disease. Retropulsed fracture fragment at L3 abuts the thecal sac without significant remodeling. Multifactorial moderate to severe central canal stenosis at L3-4 and L4-5, not well characterized on this examination. Associated bilateral mild-to-moderate neuroforaminal narrowing at these levels, right greater than left. IMPRESSION: 1. Age-indeterminate fracture of the L3 vertebral body involving both the superior and inferior endplate as well as the posterior wall with 4 mm retropulsion of the posteroinferior aspect of the L3 vertebral body, suspected acute to subacute. The posterior elements appear intact and there is no facet subluxation. MRI examination would be more helpful for further evaluation. 2. Multifactorial moderate to severe central canal stenosis at L3-4 and L4-5, not well characterized on this examination. Associated bilateral mild-to-moderate neuroforaminal narrowing at these levels, right greater than left. These findings would also be better assessed with MRI examination. Electronically Signed   By: Helyn Numbers M.D.   On: 06/21/2022 20:08   CT HEAD WO CONTRAST ( )  Result Date: 06/21/2022 CLINICAL DATA:  Fall EXAM: CT HEAD WITHOUT CONTRAST CT CERVICAL SPINE WITHOUT CONTRAST TECHNIQUE: Multidetector CT imaging of the head and cervical spine was performed following the standard protocol without intravenous contrast. Multiplanar CT image reconstructions of the cervical spine were also generated. RADIATION DOSE REDUCTION: This exam was performed according to the departmental dose-optimization program which includes automated exposure control, adjustment of the mA and/or kV according to patient size and/or use of iterative reconstruction technique. COMPARISON:  MR brain, 11/15/2020 CT chest 06/19/2003 FINDINGS: CT HEAD FINDINGS Brain: No evidence of acute infarction,  hemorrhage, hydrocephalus, extra-axial collection or mass lesion/mass effect. Vascular: No hyperdense vessel or unexpected calcification. Skull: Hyperostosis frontalis. Negative for fracture or focal lesion. Sinuses/Orbits: No acute finding. Other: None. CT CERVICAL SPINE FINDINGS Alignment: Degenerative straightening of the normal cervical lordosis. Skull base and vertebrae: No acute fracture. No primary bone lesion or focal pathologic process. Soft tissues and spinal canal: No prevertebral fluid or swelling. No visible canal hematoma. Disc levels: Moderate to severe multilevel disc space height loss and osteophytosis, worst from C5-C7 Upper chest: Negative. Other: Hypodense left lobe thyroid nodule measuring 2.7 x 2.5 cm (series 11, image 79). IMPRESSION: 1. No acute intracranial pathology. 2. No fracture or static subluxation of the cervical spine. 3. Moderate to severe multilevel cervical disc degenerative disease. 4. Hypodense left lobe thyroid nodule measuring 2.7 x 2.5 cm. Recommend thyroid ultrasound for further evaluation if not already performed, on a nonemergent, outpatient basis (ref: J Am Coll Radiol. 2015 Feb;12(2): 143-50). Electronically Signed   By: Jearld Lesch M.D.   On: 06/21/2022 18:43   CT Cervical Spine Wo Contrast  Result Date: 06/21/2022 CLINICAL DATA:  Fall EXAM: CT HEAD WITHOUT CONTRAST CT CERVICAL SPINE WITHOUT CONTRAST TECHNIQUE: Multidetector CT imaging of the head and cervical spine was  performed following the standard protocol without intravenous contrast. Multiplanar CT image reconstructions of the cervical spine were also generated. RADIATION DOSE REDUCTION: This exam was performed according to the departmental dose-optimization program which includes automated exposure control, adjustment of the mA and/or kV according to patient size and/or use of iterative reconstruction technique. COMPARISON:  MR brain, 11/15/2020 CT chest 06/19/2003 FINDINGS: CT HEAD FINDINGS Brain: No  evidence of acute infarction, hemorrhage, hydrocephalus, extra-axial collection or mass lesion/mass effect. Vascular: No hyperdense vessel or unexpected calcification. Skull: Hyperostosis frontalis. Negative for fracture or focal lesion. Sinuses/Orbits: No acute finding. Other: None. CT CERVICAL SPINE FINDINGS Alignment: Degenerative straightening of the normal cervical lordosis. Skull base and vertebrae: No acute fracture. No primary bone lesion or focal pathologic process. Soft tissues and spinal canal: No prevertebral fluid or swelling. No visible canal hematoma. Disc levels: Moderate to severe multilevel disc space height loss and osteophytosis, worst from C5-C7 Upper chest: Negative. Other: Hypodense left lobe thyroid nodule measuring 2.7 x 2.5 cm (series 11, image 79). IMPRESSION: 1. No acute intracranial pathology. 2. No fracture or static subluxation of the cervical spine. 3. Moderate to severe multilevel cervical disc degenerative disease. 4. Hypodense left lobe thyroid nodule measuring 2.7 x 2.5 cm. Recommend thyroid ultrasound for further evaluation if not already performed, on a nonemergent, outpatient basis (ref: J Am Coll Radiol. 2015 Feb;12(2): 143-50). Electronically Signed   By: Jearld Lesch M.D.   On: 06/21/2022 18:43    Microbiology: Results for orders placed or performed during the hospital encounter of 05/04/20  Resp Panel by RT-PCR (Flu A&B, Covid) Nasopharyngeal Swab     Status: None   Collection Time: 05/04/20  5:14 PM   Specimen: Nasopharyngeal Swab; Nasopharyngeal(NP) swabs in vial transport medium  Result Value Ref Range Status   SARS Coronavirus 2 by RT PCR NEGATIVE NEGATIVE Final    Comment: (NOTE) SARS-CoV-2 target nucleic acids are NOT DETECTED.  The SARS-CoV-2 RNA is generally detectable in upper respiratory specimens during the acute phase of infection. The lowest concentration of SARS-CoV-2 viral copies this assay can detect is 138 copies/mL. A negative result does  not preclude SARS-Cov-2 infection and should not be used as the sole basis for treatment or other patient management decisions. A negative result may occur with  improper specimen collection/handling, submission of specimen other than nasopharyngeal swab, presence of viral mutation(s) within the areas targeted by this assay, and inadequate number of viral copies(<138 copies/mL). A negative result must be combined with clinical observations, patient history, and epidemiological information. The expected result is Negative.  Fact Sheet for Patients:  BloggerCourse.com  Fact Sheet for Healthcare Providers:  SeriousBroker.it  This test is no t yet approved or cleared by the [place] FDA and  has been authorized for detection and/or diagnosis of SARS-CoV-2 by FDA under an Emergency Use Authorization (EUA). This EUA will remain  in effect (meaning this test can be used) for the duration of the COVID-19 declaration under Section 564(b)(1) of the Act, 21 U.S.C.section 360bbb-3(b)(1), unless the authorization is terminated  or revoked sooner.       Influenza A by PCR NEGATIVE NEGATIVE Final   Influenza B by PCR NEGATIVE NEGATIVE Final    Comment: (NOTE) The Xpert Xpress SARS-CoV-2/FLU/RSV plus assay is intended as an aid in the diagnosis of influenza from Nasopharyngeal swab specimens and should not be used as a sole basis for treatment. Nasal washings and aspirates are unacceptable for Xpert Xpress SARS-CoV-2/FLU/RSV testing.  Fact Sheet for Patients:  BloggerCourse.com  Fact Sheet for Healthcare Providers: SeriousBroker.it  This test is not yet approved or cleared by the [place] FDA and has been authorized for detection and/or diagnosis of SARS-CoV-2 by FDA under an Emergency Use Authorization (EUA). This EUA will remain in effect (meaning this test can be used) for the  duration of the COVID-19 declaration under Section 564(b)(1) of the Act, 21 U.S.C. section 360bbb-3(b)(1), unless the authorization is terminated or revoked.  Performed at [facility], [address] [place], [place] [address]   MRSA PCR Screening     Status: None   Collection Time: 05/05/20 12:45 AM   Specimen: Nasal Mucosa; Nasopharyngeal  Result Value Ref Range Status   MRSA by PCR NEGATIVE NEGATIVE Final    Comment:        The GeneXpert MRSA Assay (FDA approved for NASAL specimens only), is one component of a comprehensive MRSA colonization surveillance program. It is not intended to diagnose MRSA infection nor to guide or monitor treatment for MRSA infections. Performed at [facility], [address], [place], [place] [address]     Labs: CBC: Recent Labs  Lab 06/23/22 0441 06/25/22 0511 06/27/22 0426  WBC 9.3 8.7 7.2  NEUTROABS  --   --  5.2  HGB 15.3* 14.5 14.5  HCT 48.6* 46.6* 46.2*  MCV 97.4 96.9 98.1  PLT 162 182 196   Basic Metabolic Panel: Recent Labs  Lab 06/23/22 0441 06/24/22 0444 06/25/22 0657  NA 135 137 135  K 4.6 5.1 4.1  CL 100 102 99  CO2 28 30 29   GLUCOSE 121* 125* 110*  BUN 16 18 17   CREATININE 0.95 0.84 0.80  CALCIUM 8.5* 8.7* 8.6*   Liver Function Tests: No results for input(s): "AST", "ALT", "ALKPHOS", "BILITOT", "PROT", "ALBUMIN" in the last 168 hours. CBG: No results for input(s): "GLUCAP" in the last 168 hours.  Discharge time spent: greater than 30 minutes.  Signed: Thad Ranger, MD [facility] 06/29/2022

## 2022-07-11 ENCOUNTER — Other Ambulatory Visit: Payer: Self-pay | Admitting: Cardiology

## 2022-07-14 ENCOUNTER — Emergency Department (HOSPITAL_COMMUNITY): Payer: Medicare Other

## 2022-07-14 ENCOUNTER — Emergency Department (HOSPITAL_COMMUNITY)
Admission: EM | Admit: 2022-07-14 | Discharge: 2022-07-15 | Disposition: A | Discharge status: Home / Self Care | Payer: Medicare Other | Attending: Emergency Medicine | Admitting: Emergency Medicine

## 2022-07-14 ENCOUNTER — Other Ambulatory Visit: Payer: Self-pay

## 2022-07-14 ENCOUNTER — Encounter (HOSPITAL_COMMUNITY): Payer: Self-pay

## 2022-07-14 DIAGNOSIS — S32030A Wedge compression fracture of third lumbar vertebra, initial encounter for closed fracture: Secondary | ICD-10-CM | POA: Diagnosis not present

## 2022-07-14 DIAGNOSIS — M1612 Unilateral primary osteoarthritis, left hip: Secondary | ICD-10-CM | POA: Diagnosis not present

## 2022-07-14 DIAGNOSIS — Z7982 Long term (current) use of aspirin: Secondary | ICD-10-CM | POA: Diagnosis not present

## 2022-07-14 DIAGNOSIS — E559 Vitamin D deficiency, unspecified: Secondary | ICD-10-CM | POA: Diagnosis not present

## 2022-07-14 DIAGNOSIS — Z96642 Presence of left artificial hip joint: Secondary | ICD-10-CM | POA: Insufficient documentation

## 2022-07-14 DIAGNOSIS — M545 Low back pain, unspecified: Secondary | ICD-10-CM | POA: Insufficient documentation

## 2022-07-14 DIAGNOSIS — D631 Anemia in chronic kidney disease: Secondary | ICD-10-CM | POA: Diagnosis not present

## 2022-07-14 DIAGNOSIS — F339 Major depressive disorder, recurrent, unspecified: Secondary | ICD-10-CM | POA: Diagnosis not present

## 2022-07-14 DIAGNOSIS — I5022 Chronic systolic (congestive) heart failure: Secondary | ICD-10-CM | POA: Insufficient documentation

## 2022-07-14 DIAGNOSIS — I130 Hypertensive heart and chronic kidney disease with heart failure and stage 1 through stage 4 chronic kidney disease, or unspecified chronic kidney disease: Secondary | ICD-10-CM | POA: Insufficient documentation

## 2022-07-14 DIAGNOSIS — K219 Gastro-esophageal reflux disease without esophagitis: Secondary | ICD-10-CM | POA: Diagnosis not present

## 2022-07-14 DIAGNOSIS — E041 Nontoxic single thyroid nodule: Secondary | ICD-10-CM | POA: Diagnosis not present

## 2022-07-14 DIAGNOSIS — Z96653 Presence of artificial knee joint, bilateral: Secondary | ICD-10-CM | POA: Diagnosis not present

## 2022-07-14 DIAGNOSIS — I429 Cardiomyopathy, unspecified: Secondary | ICD-10-CM | POA: Diagnosis not present

## 2022-07-14 DIAGNOSIS — S32029D Unspecified fracture of second lumbar vertebra, subsequent encounter for fracture with routine healing: Secondary | ICD-10-CM | POA: Diagnosis not present

## 2022-07-14 DIAGNOSIS — R0602 Shortness of breath: Secondary | ICD-10-CM | POA: Diagnosis not present

## 2022-07-14 DIAGNOSIS — G4700 Insomnia, unspecified: Secondary | ICD-10-CM | POA: Diagnosis not present

## 2022-07-14 DIAGNOSIS — R0789 Other chest pain: Secondary | ICD-10-CM | POA: Diagnosis not present

## 2022-07-14 DIAGNOSIS — Z7984 Long term (current) use of oral hypoglycemic drugs: Secondary | ICD-10-CM | POA: Diagnosis not present

## 2022-07-14 DIAGNOSIS — I5042 Chronic combined systolic (congestive) and diastolic (congestive) heart failure: Secondary | ICD-10-CM | POA: Diagnosis not present

## 2022-07-14 DIAGNOSIS — M25551 Pain in right hip: Secondary | ICD-10-CM | POA: Diagnosis not present

## 2022-07-14 DIAGNOSIS — Z6841 Body Mass Index (BMI) 40.0 and over, adult: Secondary | ICD-10-CM | POA: Diagnosis not present

## 2022-07-14 DIAGNOSIS — E7800 Pure hypercholesterolemia, unspecified: Secondary | ICD-10-CM | POA: Diagnosis not present

## 2022-07-14 DIAGNOSIS — W19XXXA Unspecified fall, initial encounter: Secondary | ICD-10-CM | POA: Diagnosis not present

## 2022-07-14 DIAGNOSIS — R102 Pelvic and perineal pain: Secondary | ICD-10-CM | POA: Diagnosis not present

## 2022-07-14 DIAGNOSIS — N1831 Chronic kidney disease, stage 3a: Secondary | ICD-10-CM | POA: Insufficient documentation

## 2022-07-14 DIAGNOSIS — H9190 Unspecified hearing loss, unspecified ear: Secondary | ICD-10-CM | POA: Diagnosis not present

## 2022-07-14 DIAGNOSIS — Z8673 Personal history of transient ischemic attack (TIA), and cerebral infarction without residual deficits: Secondary | ICD-10-CM | POA: Diagnosis not present

## 2022-07-14 DIAGNOSIS — F419 Anxiety disorder, unspecified: Secondary | ICD-10-CM | POA: Diagnosis not present

## 2022-07-14 DIAGNOSIS — G2581 Restless legs syndrome: Secondary | ICD-10-CM | POA: Diagnosis not present

## 2022-07-14 DIAGNOSIS — I890 Lymphedema, not elsewhere classified: Secondary | ICD-10-CM | POA: Diagnosis not present

## 2022-07-14 DIAGNOSIS — R079 Chest pain, unspecified: Secondary | ICD-10-CM | POA: Diagnosis not present

## 2022-07-14 DIAGNOSIS — Z9181 History of falling: Secondary | ICD-10-CM | POA: Diagnosis not present

## 2022-07-14 LAB — BASIC METABOLIC PANEL
Anion gap: 16 — ABNORMAL HIGH (ref 5–15)
BUN: 20 mg/dL (ref 8–23)
CO2: 18 mmol/L — ABNORMAL LOW (ref 22–32)
Calcium: 9.5 mg/dL (ref 8.9–10.3)
Chloride: 100 mmol/L (ref 98–111)
Creatinine, Ser: 1.08 mg/dL — ABNORMAL HIGH (ref 0.44–1.00)
GFR, Estimated: 53 mL/min — ABNORMAL LOW (ref >60)
Glucose, Bld: 125 mg/dL — ABNORMAL HIGH (ref 70–99)
Potassium: 4.5 mmol/L (ref 3.5–5.1)
Sodium: 134 mmol/L — ABNORMAL LOW (ref 135–145)

## 2022-07-14 LAB — CBC
HCT: 52.4 % — ABNORMAL HIGH (ref 36.0–46.0)
Hemoglobin: 16.7 g/dL — ABNORMAL HIGH (ref 12.0–15.0)
MCH: 29.9 pg (ref 26.0–34.0)
MCHC: 31.9 g/dL (ref 30.0–36.0)
MCV: 93.7 fL (ref 80.0–100.0)
Platelets: 420 10*3/uL — ABNORMAL HIGH (ref 150–400)
RBC: 5.59 MIL/uL — ABNORMAL HIGH (ref 3.87–5.11)
RDW: 15.2 % (ref 11.5–15.5)
WBC: 10.6 10*3/uL — ABNORMAL HIGH (ref 4.0–10.5)
nRBC: 0 % (ref 0.0–0.2)

## 2022-07-14 LAB — TROPONIN I (HIGH SENSITIVITY)
Troponin I (High Sensitivity): 10 ng/L (ref <18)
Troponin I (High Sensitivity): 8 ng/L (ref <18)

## 2022-07-14 MED ORDER — LACTATED RINGERS IV BOLUS: 1000.0000 mL | Freq: Once | INTRAVENOUS | Status: DC

## 2022-07-14 MED ORDER — IBUPROFEN 400 MG PO TABS
600.0000 mg | ORAL_TABLET | Freq: Four times a day (QID) | ORAL | Status: DC
  Administered 2022-07-14: 600 mg via ORAL
  Filled 2022-07-14: qty 1

## 2022-07-14 MED ORDER — OXYCODONE-ACETAMINOPHEN 5-325 MG PO TABS
1.0000 | ORAL_TABLET | ORAL | Status: DC | PRN
  Administered 2022-07-14: 1 via ORAL
  Filled 2022-07-14: qty 1

## 2022-07-14 MED ORDER — ASPIRIN 81 MG PO CHEW: 81.0000 mg | CHEWABLE_TABLET | Freq: Every day | ORAL | Status: DC

## 2022-07-14 MED ORDER — PRAMIPEXOLE DIHYDROCHLORIDE 0.25 MG PO TABS
0.5000 mg | ORAL_TABLET | Freq: Every day | ORAL | Status: DC
  Administered 2022-07-14: 0.5 mg via ORAL
  Filled 2022-07-14: qty 2

## 2022-07-14 MED ORDER — FUROSEMIDE 20 MG PO TABS: 40.0000 mg | ORAL_TABLET | Freq: Every day | ORAL | Status: DC | PRN

## 2022-07-14 MED ORDER — DULOXETINE HCL 30 MG PO CPEP: 60.0000 mg | ORAL_CAPSULE | Freq: Every day | ORAL | Status: DC

## 2022-07-14 MED ORDER — LIDOCAINE 5 % EX PTCH
1.0000 | MEDICATED_PATCH | CUTANEOUS | Status: DC
  Administered 2022-07-14: 1 via TRANSDERMAL
  Filled 2022-07-14: qty 1

## 2022-07-14 MED ORDER — LACTATED RINGERS IV BOLUS
500.0000 mL | Freq: Once | INTRAVENOUS | Status: AC
  Administered 2022-07-14: 500 mL via INTRAVENOUS

## 2022-07-14 MED ORDER — SPIRONOLACTONE 12.5 MG HALF TABLET: 12.5000 mg | ORAL_TABLET | Freq: Every day | ORAL | Status: DC

## 2022-07-14 MED ORDER — MORPHINE SULFATE (PF) 4 MG/ML IV SOLN
6.0000 mg | Freq: Once | INTRAVENOUS | Status: AC
  Administered 2022-07-14: 6 mg via INTRAVENOUS
  Filled 2022-07-14: qty 2

## 2022-07-14 MED ORDER — CARVEDILOL 3.125 MG PO TABS: 3.1250 mg | ORAL_TABLET | Freq: Two times a day (BID) | ORAL | Status: DC

## 2022-07-14 MED ORDER — LACTATED RINGERS IV BOLUS
500.0000 mL | Freq: Once | INTRAVENOUS | Status: AC
  Administered 2022-07-15: 500 mL via INTRAVENOUS

## 2022-07-14 MED ORDER — HYDROCODONE-ACETAMINOPHEN 5-325 MG PO TABS
1.0000 | ORAL_TABLET | Freq: Four times a day (QID) | ORAL | Status: DC | PRN
  Administered 2022-07-15: 1 via ORAL
  Filled 2022-07-14: qty 1

## 2022-07-14 MED ORDER — BUPROPION HCL ER (XL) 150 MG PO TB24: 150.0000 mg | ORAL_TABLET | Freq: Every morning | ORAL | Status: DC

## 2022-07-14 MED ORDER — ACETAMINOPHEN 325 MG PO TABS
650.0000 mg | ORAL_TABLET | Freq: Four times a day (QID) | ORAL | Status: DC
  Administered 2022-07-14: 650 mg via ORAL
  Filled 2022-07-14: qty 2

## 2022-07-14 MED ORDER — MORPHINE SULFATE (PF) 4 MG/ML IV SOLN
INTRAVENOUS | Status: AC
  Filled 2022-07-14: qty 1

## 2022-07-14 MED ORDER — PRAMIPEXOLE DIHYDROCHLORIDE 0.25 MG PO TABS: 0.2500 mg | ORAL_TABLET | Freq: Every morning | ORAL | Status: DC

## 2022-07-14 NOTE — ED Notes (Signed)
[employer] pull 2 morphine vials. One of the vials was broken and there was no medication in the vial. Was wasted and another was pulled as override.

## 2022-07-14 NOTE — ED Provider Notes (Signed)
Paige [name]   HPI: Paige [name] is a 77 year old female with a past medical history as below presenting today with chronic lower back and right hip pain.  She reports this pain is been present from a fall she had 3 weeks ago.  She was admitted to the hospital before being discharged to a rehab facility.  She reports being discharged from the facility yesterday.  She reports she was on scheduled oxycodone at the facility and upon being discharged she was only given a few tablets that she has not run out of.  She reports since running out of these medications she has had return of her pain.  She denies any new falls or new trauma.  She reports the pain that is present is the same pain that has been present since her fall 3 weeks ago.  She has not had fevers or chills.  She denies urinary incontinence, urinary retention, fecal incontinence.  She has not had lower extremity weakness or numbness.  She ambulates with a walker.  Past Medical History:  Diagnosis Date   Acute exacerbation of CHF (congestive heart failure) (HCC) 05/04/2020   Acute respiratory failure with hypoxia (HCC) 05/08/2020   Anemia    Anxiety disorder 05/04/2020   Arthritis    Arthritis of knee 09/25/2013   Arthritis of knee, left 09/25/2013   Arthritis of right knee 02/10/2014   Back pain    Chronic HFrEF (heart failure with reduced ejection fraction) (HCC) 06/22/2022   Chronic kidney disease, stage 3a (HCC) 05/04/2020   Class 3 severe obesity with serious comorbidity and body mass index (BMI) of 45.0 to 49.9 in adult (HCC) 03/29/2018   Congestive heart failure (HCC) 06/01/2021   Depression    Elevated troponin 05/08/2020   Esophageal reflux 05/04/2020   Essential hypertension 05/08/2020   Fatigue    First degree AV block 06/22/2022   Gallbladder problem    H/O hiatal hernia    Hearing loss 06/01/2021   High cholesterol 05/04/2020   Hyperlipidemia  05/08/2020   Insomnia 06/01/2021   Insulin resistance 03/29/2018   Interscapular region contusion 06/01/2021   Joint pain    Lumbar vertebral fracture (HCC) 06/22/2022   Lymphedema of both lower extremities    Morbid obesity with BMI of 50.0-59.9, adult (HCC) 05/08/2020   Muscle pain 06/01/2021   Obesity    Osteoarthritis    Other long term (current) drug therapy 06/01/2021   Palpitations 11/24/2020   Perennial allergic rhinitis 01/10/2018   Pernicious anemia 06/01/2021   PONV (postoperative nausea and vomiting)    after Cholecysectomy   Prediabetes 06/01/2021   Primary osteoarthritis of left hip 11/26/2015   Primary osteoarthritis of right knee 02/09/2014   PVC's (premature ventricular contractions) 06/22/2022   Recurrent major depression (HCC) 06/01/2021   Restless leg syndrome 05/08/2020   Rhinitis medicamentosa 01/10/2018   Sleep disturbance 06/01/2021   Systolic and diastolic CHF, acute (HCC) 05/08/2020   Thyroid nodule 06/22/2022   Tremor 06/01/2021   Urinary incontinence 06/01/2021   Vasomotor rhinitis 01/10/2018   Vertigo    Vitamin B 12 deficiency    Vitamin D deficiency 03/29/2018    Past Surgical History:  Procedure Laterality Date   ABDOMINAL HYSTERECTOMY     CHOLECYSTECTOMY     COLONOSCOPY     EYE SURGERY Bilateral    cataracts   FRACTURE SURGERY Right    arm age 48   FRACTURE SURGERY Bilateral  both thumbs   IR KYPHO LUMBAR INC FX REDUCE BONE BX UNI/BIL CANNULATION INC/IMAGING  06/27/2022   LAPAROSCOPIC GASTRIC BANDING     2010   OVARIAN CYST REMOVAL     RIGHT/LEFT HEART CATH AND CORONARY ANGIOGRAPHY N/A 05/07/2020   Procedure: RIGHT/LEFT HEART CATH AND CORONARY ANGIOGRAPHY;  Surgeon: Orpah Cobb, MD;  Location: [facility];  Service: Cardiovascular;  Laterality: N/A;   TOTAL HIP ARTHROPLASTY Left 11/30/2015   Procedure: TOTAL HIP ARTHROPLASTY ANTERIOR APPROACH;  Surgeon: Gean Birchwood, MD;  Location: [facility] OR;  Service: Orthopedics;  Laterality:  Left;   TOTAL KNEE ARTHROPLASTY Left 09/25/2013   Procedure: TOTAL KNEE ARTHROPLASTY;  Surgeon: Nestor Lewandowsky, MD;  Location: [facility] OR;  Service: Orthopedics;  Laterality: Left;   TOTAL KNEE ARTHROPLASTY Right 02/10/2014   dr Turner Daniels   TOTAL KNEE ARTHROPLASTY Right 02/10/2014   Procedure: TOTAL KNEE ARTHROPLASTY;  Surgeon: Nestor Lewandowsky, MD;  Location: [facility] OR;  Service: Orthopedics;  Laterality: Right;     Social History   Tobacco Use   Smoking status: Never   Smokeless tobacco: Never  Substance Use Topics   Alcohol use: Yes    Comment: rarely   Drug use: No      Review of Systems  A complete ROS was performed with pertinent positives/negatives noted in the HPI.   Vitals:   07/14/22 1901 07/14/22 2208  BP: 136/74 134/83  Pulse: 94 88  Resp: 17 20  Temp: 98 F (36.7 C) 97.7 F (36.5 C)  SpO2: 97% 99%    Physical Exam Vitals and nursing [name] reviewed.  Constitutional:      General: She is not in acute distress.    Appearance: She is well-developed. She is not ill-appearing.  Cardiovascular:     Rate and Rhythm: Normal rate and regular rhythm.     Pulses: Normal pulses.     Heart sounds: Normal heart sounds. No murmur heard.    No friction rub. No gallop.  Pulmonary:     Effort: Pulmonary effort is normal. No respiratory distress.     Breath sounds: Normal breath sounds. No stridor. No wheezing, rhonchi or rales.  Abdominal:     General: Abdomen is flat. There is no distension.     Palpations: Abdomen is soft.     Tenderness: There is no abdominal tenderness. There is no guarding or rebound.  Musculoskeletal:        General: No swelling.     Cervical back: Neck supple.     Comments: No tenderness to palpation of range of motion of the right hip.  Tenderness to palpation of the right hip generalized.  No tenderness to palpation of the midline L-spine  Skin:    General: Skin is warm and dry.     Capillary Refill: Capillary refill takes less than 2 seconds.     Comments:  No pilonidal abscess/cyst.  Neurological:     Mental Status: She is alert.  Psychiatric:        Mood and Affect: Mood normal.     Procedures  MDM:  Imaging/radiology results:  DG Chest 2 View  Result Date: 07/14/2022 CLINICAL DATA:  Chest pain and shortness of breath EXAM: CHEST - 2 VIEW COMPARISON:  05/04/2020 FINDINGS: The heart size and mediastinal contours are within normal limits. Both lungs are clear. The visualized skeletal structures are unremarkable. IMPRESSION: No active cardiopulmonary disease. Electronically Signed   By: Alcide Clever M.D.   On: 07/14/2022 20:55  DG Hip Unilat  With Pelvis 2-3 Views Right  Result Date: 07/14/2022 CLINICAL DATA:  History of recent fall with pelvic pain, initial encounter EXAM: DG HIP (WITH OR WITHOUT PELVIS) 3V RIGHT COMPARISON:  None Available. FINDINGS: Left hip prosthesis is noted. Pelvic ring appears intact. No definitive fracture is seen. No soft tissue abnormality is noted. IMPRESSION: No acute abnormality noted. Electronically Signed   By: Alcide Clever M.D.   On: 07/14/2022 20:50      EKG results: ECG on my interpretation is normal sinus rhythm and rate, without anatomical ischemia representing STEMI, New-onset Arrhythmia, or ischemic equivalent.     Lab results:  Results for orders placed or performed during the hospital encounter of 07/14/22 (from the past 24 hour(s))  Basic metabolic panel     Status: Abnormal   Collection Time: 07/14/22  4:18 PM  Result Value Ref Range   Sodium 134 (L) 135 - 145 mmol/L   Potassium 4.5 3.5 - 5.1 mmol/L   Chloride 100 98 - 111 mmol/L   CO2 18 (L) 22 - 32 mmol/L   Glucose, Bld 125 (H) 70 - 99 mg/dL   BUN 20 8 - 23 mg/dL   Creatinine, Ser 4.09 (H) 0.44 - 1.00 mg/dL   Calcium 9.5 8.9 - 81.1 mg/dL   GFR, Estimated 53 (L) >60 mL/min   Anion gap 16 (H) 5 - 15  CBC     Status: Abnormal   Collection Time: 07/14/22  4:18 PM  Result Value Ref Range   WBC 10.6 (H) 4.0 - 10.5 K/uL   RBC 5.59 (H)  3.87 - 5.11 MIL/uL   Hemoglobin 16.7 (H) 12.0 - 15.0 g/dL   HCT 91.4 (H) 78.2 - 95.6 %   MCV 93.7 80.0 - 100.0 fL   MCH 29.9 26.0 - 34.0 pg   MCHC 31.9 30.0 - 36.0 g/dL   RDW 21.3 08.6 - 57.8 %   Platelets 420 (H) 150 - 400 K/uL   nRBC 0.0 0.0 - 0.2 %  Troponin I (High Sensitivity)     Status: None   Collection Time: 07/14/22  4:18 PM  Result Value Ref Range   Troponin I (High Sensitivity) 10 <18 ng/L  Troponin I (High Sensitivity)     Status: None   Collection Time: 07/14/22  6:41 PM  Result Value Ref Range   Troponin I (High Sensitivity) 8 <18 ng/L     Key medications administered in the ER:  Medications  oxyCODONE-acetaminophen (PERCOCET/ROXICET) 5-325 MG per tablet 1 tablet (1 tablet Oral Given 07/14/22 1622)  acetaminophen (TYLENOL) tablet 650 mg (has no administration in time range)  ibuprofen (ADVIL) tablet 600 mg (has no administration in time range)  HYDROcodone-acetaminophen (NORCO/VICODIN) 5-325 MG per tablet 1 tablet (has no administration in time range)  lidocaine (LIDODERM) 5 % 1 patch (has no administration in time range)  aspirin chewable tablet 81 mg (has no administration in time range)  buPROPion (WELLBUTRIN XL) 24 hr tablet 150 mg (has no administration in time range)  carvedilol (COREG) tablet 3.125 mg (has no administration in time range)  DULoxetine (CYMBALTA) DR capsule 60 mg (has no administration in time range)  furosemide (LASIX) tablet 40 mg (has no administration in time range)  spironolactone (ALDACTONE) tablet 12.5 mg (has no administration in time range)  pramipexole (MIRAPEX) tablet 0.25-0.5 mg (has no administration in time range)  morphine (PF) 4 MG/ML injection 6 mg (6 mg Intravenous Given 07/14/22 2208)  lactated ringers bolus 500 mL (500  mLs Intravenous New Bag/Given 07/14/22 2209)    Consults: PT, social work/TOC  Medical decision making: -Vital signs stable. Patient afebrile, hemodynamically stable, and non-toxic appearing. -Patient's  presentation is most consistent with acute complicated illness / injury requiring diagnostic workup.. -Paige [name] is a 77 y.o. female presenting to the emergency department with chronic pain as above.  Exam as above.  Patient is complaining of back pain. No history of new/recent trauma, no history of IV drug use, no cancer history, no neurological complaints or deficits, no urinary retention, no bladder/bowel incontinence, no fevers/chills/night sweats, no anticoagulant use, no spinous process tenderness. Doubt cauda equina, conus medullaris syndrome or other epidural compression syndrome - no saddle paresthesias, no bowel/bladder incontinence, no UMN signs on physical exam, inconsistent with history and physical, low risk, primary diagnosis much more likely. Doubt abscess, epidural mass lesion, fracture, pyelonephritis, renal colic/infarct, AAA, aortic dissection, pna, pancreatitis, transverse myelitis, herpetic neuralgia, as inconsistent with history and physical, low risk, primary diagnosis much more likely. Do not believe MRI or other further imaging necessary at this time. -Patient is also complaining of right hip pain.  This again is chronic from fall 3 weeks ago.  She has tenderness to palpation.  X-rays been obtained on my interpretation does not show acute abnormality.  She has been able to bear weight on this therefore lower suspicion for occult fracture.  She has had no recent trauma since the initial fall.  I am able to passively range the hip without tenderness, no fevers or chills, patient is afebrile in the emergency department lower concern for septic joint.   -Labs have been obtained and CBC is slightly hemoconcentrated.  BMP shows mild hyponatremia to 134 with a slight anion gap.  Overall labs consistent with dehydration will give fluid bolus. -I have ordered pain control for the patient while in the emergency department.  Upon reassessment, patient reports significant improvement in  pain.  She reports it has been challenging at home with this pain.  She endorses living alone.  Overall have a lower concern for acute abnormality and believe this patient's presentation is more consistent with chronic pain.  Will provide oral multimodal pain regimen, home meds ordered, diet ordered and will proceed with PT evaluation as well as TOC consult.  Final disposition pending these evaluations.    Medical Decision Making Amount and/or Complexity of Data Reviewed Labs: ordered. Decision-making details documented in ED Course. Radiology: ordered and independent interpretation performed. Decision-making details documented in ED Course. ECG/medicine tests: ordered and independent interpretation performed. Decision-making details documented in ED Course.  Risk OTC drugs. Prescription drug management.     The plan for this patient was discussed with Dr. Elpidio Anis, who voiced agreement and who oversaw evaluation and treatment of this patient.  Marta Lamas, MD Emergency Medicine, PGY-3  [name]: Dragon medical dictation software was used in the creation of this [name].   Clinical Impression:  1. Acute low back pain, unspecified back pain laterality, unspecified whether sciatica present   2. Right hip pain     Rx / DC Orders ED Discharge Orders     None          Chase Caller, MD 07/14/22 2320    Mardene Sayer, MD 07/15/22 [phone]

## 2022-07-14 NOTE — Care Management (Signed)
ED RNCM received call from Dr. Ericka Pontiff concerning patient needing PT evaluation for SNF vs. HH.  EDP requesting PT eval to determine the level of care.

## 2022-07-14 NOTE — ED Triage Notes (Addendum)
Pt came in via POV d/t her Neurosurgeon recommending she come in for evaluation d/t her her extreme pain level while in his office today (Pt reports she saw Dr. Jordan Likes). She had a fall 3 weeks ago, went to [facility] & was sent to rehab for 2 weeks & had been home for one day before she went to her appointment today with Dr. Jordan Likes, then came to ED. A/Ox4, rates tale bone & Rt hip bone pain 10/10 while in triage. Denies any more falls. Does report increased SOB with CP & worsening bil leg swelling since this pain has gotten worse, Hx of CHF, has not taken any of her regular cardiac med or her lasix.

## 2022-07-14 NOTE — ED Notes (Signed)
Pt very tearful in triage d/t pain, was put in recliner for more comfort d/t pain.

## 2022-07-15 ENCOUNTER — Other Ambulatory Visit: Payer: Self-pay | Admitting: Cardiology

## 2022-07-15 DIAGNOSIS — I5042 Chronic combined systolic (congestive) and diastolic (congestive) heart failure: Secondary | ICD-10-CM

## 2022-07-15 DIAGNOSIS — M545 Low back pain, unspecified: Secondary | ICD-10-CM | POA: Diagnosis not present

## 2022-07-15 NOTE — ED Provider Notes (Signed)
I was told by nursing that patient is requesting discharge. It appears that she was waiting for TOC 2/2 chronic pain. Patient does not want to wait any longer   It does not appear that there was an emergent cause for her symptoms and her workup was reassuring here. Will d/c per request.    Marily Memos, MD 07/15/22 [phone]

## 2022-07-15 NOTE — ED Notes (Signed)
Pt states she no longer wants to wait for [facility] consult and wants to go home. Dr. Clayborne Dana made aware.

## 2022-07-19 ENCOUNTER — Ambulatory Visit: Payer: Medicare Other | Admitting: Cardiology

## 2022-07-21 DIAGNOSIS — G2581 Restless legs syndrome: Secondary | ICD-10-CM | POA: Diagnosis not present

## 2022-07-21 DIAGNOSIS — I130 Hypertensive heart and chronic kidney disease with heart failure and stage 1 through stage 4 chronic kidney disease, or unspecified chronic kidney disease: Secondary | ICD-10-CM | POA: Diagnosis not present

## 2022-07-21 DIAGNOSIS — M1612 Unilateral primary osteoarthritis, left hip: Secondary | ICD-10-CM | POA: Diagnosis not present

## 2022-07-21 DIAGNOSIS — I429 Cardiomyopathy, unspecified: Secondary | ICD-10-CM | POA: Diagnosis not present

## 2022-07-21 DIAGNOSIS — F339 Major depressive disorder, recurrent, unspecified: Secondary | ICD-10-CM | POA: Diagnosis not present

## 2022-07-21 DIAGNOSIS — K219 Gastro-esophageal reflux disease without esophagitis: Secondary | ICD-10-CM | POA: Diagnosis not present

## 2022-07-21 DIAGNOSIS — H9190 Unspecified hearing loss, unspecified ear: Secondary | ICD-10-CM | POA: Diagnosis not present

## 2022-07-21 DIAGNOSIS — G4700 Insomnia, unspecified: Secondary | ICD-10-CM | POA: Diagnosis not present

## 2022-07-21 DIAGNOSIS — I890 Lymphedema, not elsewhere classified: Secondary | ICD-10-CM | POA: Diagnosis not present

## 2022-07-21 DIAGNOSIS — I5042 Chronic combined systolic (congestive) and diastolic (congestive) heart failure: Secondary | ICD-10-CM | POA: Diagnosis not present

## 2022-07-21 DIAGNOSIS — N1831 Chronic kidney disease, stage 3a: Secondary | ICD-10-CM | POA: Diagnosis not present

## 2022-07-21 DIAGNOSIS — E7800 Pure hypercholesterolemia, unspecified: Secondary | ICD-10-CM | POA: Diagnosis not present

## 2022-07-21 DIAGNOSIS — S32029D Unspecified fracture of second lumbar vertebra, subsequent encounter for fracture with routine healing: Secondary | ICD-10-CM | POA: Diagnosis not present

## 2022-07-21 DIAGNOSIS — E041 Nontoxic single thyroid nodule: Secondary | ICD-10-CM | POA: Diagnosis not present

## 2022-07-21 DIAGNOSIS — D631 Anemia in chronic kidney disease: Secondary | ICD-10-CM | POA: Diagnosis not present

## 2022-07-21 DIAGNOSIS — F419 Anxiety disorder, unspecified: Secondary | ICD-10-CM | POA: Diagnosis not present

## 2022-07-25 DIAGNOSIS — E7800 Pure hypercholesterolemia, unspecified: Secondary | ICD-10-CM | POA: Diagnosis not present

## 2022-07-25 DIAGNOSIS — D631 Anemia in chronic kidney disease: Secondary | ICD-10-CM | POA: Diagnosis not present

## 2022-07-25 DIAGNOSIS — I130 Hypertensive heart and chronic kidney disease with heart failure and stage 1 through stage 4 chronic kidney disease, or unspecified chronic kidney disease: Secondary | ICD-10-CM | POA: Diagnosis not present

## 2022-07-25 DIAGNOSIS — I429 Cardiomyopathy, unspecified: Secondary | ICD-10-CM | POA: Diagnosis not present

## 2022-07-25 DIAGNOSIS — S32000A Wedge compression fracture of unspecified lumbar vertebra, initial encounter for closed fracture: Secondary | ICD-10-CM | POA: Diagnosis not present

## 2022-07-25 DIAGNOSIS — K219 Gastro-esophageal reflux disease without esophagitis: Secondary | ICD-10-CM | POA: Diagnosis not present

## 2022-07-25 DIAGNOSIS — S32029D Unspecified fracture of second lumbar vertebra, subsequent encounter for fracture with routine healing: Secondary | ICD-10-CM | POA: Diagnosis not present

## 2022-07-25 DIAGNOSIS — G2581 Restless legs syndrome: Secondary | ICD-10-CM | POA: Diagnosis not present

## 2022-07-25 DIAGNOSIS — G4700 Insomnia, unspecified: Secondary | ICD-10-CM | POA: Diagnosis not present

## 2022-07-25 DIAGNOSIS — F419 Anxiety disorder, unspecified: Secondary | ICD-10-CM | POA: Diagnosis not present

## 2022-07-25 DIAGNOSIS — F339 Major depressive disorder, recurrent, unspecified: Secondary | ICD-10-CM | POA: Diagnosis not present

## 2022-07-25 DIAGNOSIS — N1831 Chronic kidney disease, stage 3a: Secondary | ICD-10-CM | POA: Diagnosis not present

## 2022-07-25 DIAGNOSIS — I5042 Chronic combined systolic (congestive) and diastolic (congestive) heart failure: Secondary | ICD-10-CM | POA: Diagnosis not present

## 2022-07-25 DIAGNOSIS — H9190 Unspecified hearing loss, unspecified ear: Secondary | ICD-10-CM | POA: Diagnosis not present

## 2022-07-25 DIAGNOSIS — M1612 Unilateral primary osteoarthritis, left hip: Secondary | ICD-10-CM | POA: Diagnosis not present

## 2022-07-25 DIAGNOSIS — E041 Nontoxic single thyroid nodule: Secondary | ICD-10-CM | POA: Diagnosis not present

## 2022-07-25 DIAGNOSIS — I890 Lymphedema, not elsewhere classified: Secondary | ICD-10-CM | POA: Diagnosis not present

## 2022-07-26 ENCOUNTER — Other Ambulatory Visit: Payer: Self-pay | Admitting: Family Medicine

## 2022-07-26 DIAGNOSIS — E041 Nontoxic single thyroid nodule: Secondary | ICD-10-CM

## 2022-07-27 DIAGNOSIS — I130 Hypertensive heart and chronic kidney disease with heart failure and stage 1 through stage 4 chronic kidney disease, or unspecified chronic kidney disease: Secondary | ICD-10-CM | POA: Diagnosis not present

## 2022-07-27 DIAGNOSIS — I429 Cardiomyopathy, unspecified: Secondary | ICD-10-CM | POA: Diagnosis not present

## 2022-07-27 DIAGNOSIS — G4700 Insomnia, unspecified: Secondary | ICD-10-CM | POA: Diagnosis not present

## 2022-07-27 DIAGNOSIS — F339 Major depressive disorder, recurrent, unspecified: Secondary | ICD-10-CM | POA: Diagnosis not present

## 2022-07-27 DIAGNOSIS — E041 Nontoxic single thyroid nodule: Secondary | ICD-10-CM | POA: Diagnosis not present

## 2022-07-27 DIAGNOSIS — E7800 Pure hypercholesterolemia, unspecified: Secondary | ICD-10-CM | POA: Diagnosis not present

## 2022-07-27 DIAGNOSIS — I5042 Chronic combined systolic (congestive) and diastolic (congestive) heart failure: Secondary | ICD-10-CM | POA: Diagnosis not present

## 2022-07-27 DIAGNOSIS — N1831 Chronic kidney disease, stage 3a: Secondary | ICD-10-CM | POA: Diagnosis not present

## 2022-07-27 DIAGNOSIS — H9190 Unspecified hearing loss, unspecified ear: Secondary | ICD-10-CM | POA: Diagnosis not present

## 2022-07-27 DIAGNOSIS — S32029D Unspecified fracture of second lumbar vertebra, subsequent encounter for fracture with routine healing: Secondary | ICD-10-CM | POA: Diagnosis not present

## 2022-07-27 DIAGNOSIS — D631 Anemia in chronic kidney disease: Secondary | ICD-10-CM | POA: Diagnosis not present

## 2022-07-27 DIAGNOSIS — K219 Gastro-esophageal reflux disease without esophagitis: Secondary | ICD-10-CM | POA: Diagnosis not present

## 2022-07-27 DIAGNOSIS — I890 Lymphedema, not elsewhere classified: Secondary | ICD-10-CM | POA: Diagnosis not present

## 2022-07-27 DIAGNOSIS — F419 Anxiety disorder, unspecified: Secondary | ICD-10-CM | POA: Diagnosis not present

## 2022-07-27 DIAGNOSIS — G2581 Restless legs syndrome: Secondary | ICD-10-CM | POA: Diagnosis not present

## 2022-07-27 DIAGNOSIS — M1612 Unilateral primary osteoarthritis, left hip: Secondary | ICD-10-CM | POA: Diagnosis not present

## 2022-08-02 DIAGNOSIS — F419 Anxiety disorder, unspecified: Secondary | ICD-10-CM | POA: Diagnosis not present

## 2022-08-02 DIAGNOSIS — M1612 Unilateral primary osteoarthritis, left hip: Secondary | ICD-10-CM | POA: Diagnosis not present

## 2022-08-02 DIAGNOSIS — G4700 Insomnia, unspecified: Secondary | ICD-10-CM | POA: Diagnosis not present

## 2022-08-02 DIAGNOSIS — I5042 Chronic combined systolic (congestive) and diastolic (congestive) heart failure: Secondary | ICD-10-CM | POA: Diagnosis not present

## 2022-08-02 DIAGNOSIS — N1831 Chronic kidney disease, stage 3a: Secondary | ICD-10-CM | POA: Diagnosis not present

## 2022-08-02 DIAGNOSIS — F339 Major depressive disorder, recurrent, unspecified: Secondary | ICD-10-CM | POA: Diagnosis not present

## 2022-08-02 DIAGNOSIS — I890 Lymphedema, not elsewhere classified: Secondary | ICD-10-CM | POA: Diagnosis not present

## 2022-08-02 DIAGNOSIS — I130 Hypertensive heart and chronic kidney disease with heart failure and stage 1 through stage 4 chronic kidney disease, or unspecified chronic kidney disease: Secondary | ICD-10-CM | POA: Diagnosis not present

## 2022-08-02 DIAGNOSIS — S32029D Unspecified fracture of second lumbar vertebra, subsequent encounter for fracture with routine healing: Secondary | ICD-10-CM | POA: Diagnosis not present

## 2022-08-02 DIAGNOSIS — D631 Anemia in chronic kidney disease: Secondary | ICD-10-CM | POA: Diagnosis not present

## 2022-08-02 DIAGNOSIS — G2581 Restless legs syndrome: Secondary | ICD-10-CM | POA: Diagnosis not present

## 2022-08-02 DIAGNOSIS — E041 Nontoxic single thyroid nodule: Secondary | ICD-10-CM | POA: Diagnosis not present

## 2022-08-02 DIAGNOSIS — K219 Gastro-esophageal reflux disease without esophagitis: Secondary | ICD-10-CM | POA: Diagnosis not present

## 2022-08-02 DIAGNOSIS — E7800 Pure hypercholesterolemia, unspecified: Secondary | ICD-10-CM | POA: Diagnosis not present

## 2022-08-02 DIAGNOSIS — H9190 Unspecified hearing loss, unspecified ear: Secondary | ICD-10-CM | POA: Diagnosis not present

## 2022-08-02 DIAGNOSIS — I429 Cardiomyopathy, unspecified: Secondary | ICD-10-CM | POA: Diagnosis not present

## 2022-08-04 DIAGNOSIS — I429 Cardiomyopathy, unspecified: Secondary | ICD-10-CM | POA: Diagnosis not present

## 2022-08-04 DIAGNOSIS — K219 Gastro-esophageal reflux disease without esophagitis: Secondary | ICD-10-CM | POA: Diagnosis not present

## 2022-08-04 DIAGNOSIS — I5042 Chronic combined systolic (congestive) and diastolic (congestive) heart failure: Secondary | ICD-10-CM | POA: Diagnosis not present

## 2022-08-04 DIAGNOSIS — H9190 Unspecified hearing loss, unspecified ear: Secondary | ICD-10-CM | POA: Diagnosis not present

## 2022-08-04 DIAGNOSIS — D631 Anemia in chronic kidney disease: Secondary | ICD-10-CM | POA: Diagnosis not present

## 2022-08-04 DIAGNOSIS — S32029D Unspecified fracture of second lumbar vertebra, subsequent encounter for fracture with routine healing: Secondary | ICD-10-CM | POA: Diagnosis not present

## 2022-08-04 DIAGNOSIS — N1831 Chronic kidney disease, stage 3a: Secondary | ICD-10-CM | POA: Diagnosis not present

## 2022-08-04 DIAGNOSIS — I890 Lymphedema, not elsewhere classified: Secondary | ICD-10-CM | POA: Diagnosis not present

## 2022-08-04 DIAGNOSIS — E7800 Pure hypercholesterolemia, unspecified: Secondary | ICD-10-CM | POA: Diagnosis not present

## 2022-08-04 DIAGNOSIS — M1612 Unilateral primary osteoarthritis, left hip: Secondary | ICD-10-CM | POA: Diagnosis not present

## 2022-08-04 DIAGNOSIS — E041 Nontoxic single thyroid nodule: Secondary | ICD-10-CM | POA: Diagnosis not present

## 2022-08-04 DIAGNOSIS — F339 Major depressive disorder, recurrent, unspecified: Secondary | ICD-10-CM | POA: Diagnosis not present

## 2022-08-04 DIAGNOSIS — F419 Anxiety disorder, unspecified: Secondary | ICD-10-CM | POA: Diagnosis not present

## 2022-08-04 DIAGNOSIS — I130 Hypertensive heart and chronic kidney disease with heart failure and stage 1 through stage 4 chronic kidney disease, or unspecified chronic kidney disease: Secondary | ICD-10-CM | POA: Diagnosis not present

## 2022-08-04 DIAGNOSIS — G2581 Restless legs syndrome: Secondary | ICD-10-CM | POA: Diagnosis not present

## 2022-08-04 DIAGNOSIS — G4700 Insomnia, unspecified: Secondary | ICD-10-CM | POA: Diagnosis not present

## 2022-08-09 DIAGNOSIS — E7800 Pure hypercholesterolemia, unspecified: Secondary | ICD-10-CM | POA: Diagnosis not present

## 2022-08-09 DIAGNOSIS — K219 Gastro-esophageal reflux disease without esophagitis: Secondary | ICD-10-CM | POA: Diagnosis not present

## 2022-08-09 DIAGNOSIS — H9190 Unspecified hearing loss, unspecified ear: Secondary | ICD-10-CM | POA: Diagnosis not present

## 2022-08-09 DIAGNOSIS — F339 Major depressive disorder, recurrent, unspecified: Secondary | ICD-10-CM | POA: Diagnosis not present

## 2022-08-09 DIAGNOSIS — I429 Cardiomyopathy, unspecified: Secondary | ICD-10-CM | POA: Diagnosis not present

## 2022-08-09 DIAGNOSIS — G4700 Insomnia, unspecified: Secondary | ICD-10-CM | POA: Diagnosis not present

## 2022-08-09 DIAGNOSIS — D631 Anemia in chronic kidney disease: Secondary | ICD-10-CM | POA: Diagnosis not present

## 2022-08-09 DIAGNOSIS — E041 Nontoxic single thyroid nodule: Secondary | ICD-10-CM | POA: Diagnosis not present

## 2022-08-09 DIAGNOSIS — I5042 Chronic combined systolic (congestive) and diastolic (congestive) heart failure: Secondary | ICD-10-CM | POA: Diagnosis not present

## 2022-08-09 DIAGNOSIS — N1831 Chronic kidney disease, stage 3a: Secondary | ICD-10-CM | POA: Diagnosis not present

## 2022-08-09 DIAGNOSIS — G2581 Restless legs syndrome: Secondary | ICD-10-CM | POA: Diagnosis not present

## 2022-08-09 DIAGNOSIS — I890 Lymphedema, not elsewhere classified: Secondary | ICD-10-CM | POA: Diagnosis not present

## 2022-08-09 DIAGNOSIS — S32029D Unspecified fracture of second lumbar vertebra, subsequent encounter for fracture with routine healing: Secondary | ICD-10-CM | POA: Diagnosis not present

## 2022-08-09 DIAGNOSIS — I130 Hypertensive heart and chronic kidney disease with heart failure and stage 1 through stage 4 chronic kidney disease, or unspecified chronic kidney disease: Secondary | ICD-10-CM | POA: Diagnosis not present

## 2022-08-09 DIAGNOSIS — M1612 Unilateral primary osteoarthritis, left hip: Secondary | ICD-10-CM | POA: Diagnosis not present

## 2022-08-09 DIAGNOSIS — F419 Anxiety disorder, unspecified: Secondary | ICD-10-CM | POA: Diagnosis not present

## 2022-08-10 DIAGNOSIS — F339 Major depressive disorder, recurrent, unspecified: Secondary | ICD-10-CM | POA: Diagnosis not present

## 2022-08-10 DIAGNOSIS — G4700 Insomnia, unspecified: Secondary | ICD-10-CM | POA: Diagnosis not present

## 2022-08-10 DIAGNOSIS — E7800 Pure hypercholesterolemia, unspecified: Secondary | ICD-10-CM | POA: Diagnosis not present

## 2022-08-10 DIAGNOSIS — E041 Nontoxic single thyroid nodule: Secondary | ICD-10-CM | POA: Diagnosis not present

## 2022-08-10 DIAGNOSIS — F419 Anxiety disorder, unspecified: Secondary | ICD-10-CM | POA: Diagnosis not present

## 2022-08-10 DIAGNOSIS — D631 Anemia in chronic kidney disease: Secondary | ICD-10-CM | POA: Diagnosis not present

## 2022-08-10 DIAGNOSIS — S32029D Unspecified fracture of second lumbar vertebra, subsequent encounter for fracture with routine healing: Secondary | ICD-10-CM | POA: Diagnosis not present

## 2022-08-10 DIAGNOSIS — M1612 Unilateral primary osteoarthritis, left hip: Secondary | ICD-10-CM | POA: Diagnosis not present

## 2022-08-10 DIAGNOSIS — I429 Cardiomyopathy, unspecified: Secondary | ICD-10-CM | POA: Diagnosis not present

## 2022-08-10 DIAGNOSIS — I890 Lymphedema, not elsewhere classified: Secondary | ICD-10-CM | POA: Diagnosis not present

## 2022-08-10 DIAGNOSIS — I5042 Chronic combined systolic (congestive) and diastolic (congestive) heart failure: Secondary | ICD-10-CM | POA: Diagnosis not present

## 2022-08-10 DIAGNOSIS — G2581 Restless legs syndrome: Secondary | ICD-10-CM | POA: Diagnosis not present

## 2022-08-10 DIAGNOSIS — I130 Hypertensive heart and chronic kidney disease with heart failure and stage 1 through stage 4 chronic kidney disease, or unspecified chronic kidney disease: Secondary | ICD-10-CM | POA: Diagnosis not present

## 2022-08-10 DIAGNOSIS — H9190 Unspecified hearing loss, unspecified ear: Secondary | ICD-10-CM | POA: Diagnosis not present

## 2022-08-10 DIAGNOSIS — K219 Gastro-esophageal reflux disease without esophagitis: Secondary | ICD-10-CM | POA: Diagnosis not present

## 2022-08-10 DIAGNOSIS — N1831 Chronic kidney disease, stage 3a: Secondary | ICD-10-CM | POA: Diagnosis not present

## 2022-08-11 DIAGNOSIS — M1612 Unilateral primary osteoarthritis, left hip: Secondary | ICD-10-CM | POA: Diagnosis not present

## 2022-08-11 DIAGNOSIS — I890 Lymphedema, not elsewhere classified: Secondary | ICD-10-CM | POA: Diagnosis not present

## 2022-08-11 DIAGNOSIS — I130 Hypertensive heart and chronic kidney disease with heart failure and stage 1 through stage 4 chronic kidney disease, or unspecified chronic kidney disease: Secondary | ICD-10-CM | POA: Diagnosis not present

## 2022-08-11 DIAGNOSIS — F339 Major depressive disorder, recurrent, unspecified: Secondary | ICD-10-CM | POA: Diagnosis not present

## 2022-08-11 DIAGNOSIS — D631 Anemia in chronic kidney disease: Secondary | ICD-10-CM | POA: Diagnosis not present

## 2022-08-11 DIAGNOSIS — G2581 Restless legs syndrome: Secondary | ICD-10-CM | POA: Diagnosis not present

## 2022-08-11 DIAGNOSIS — F419 Anxiety disorder, unspecified: Secondary | ICD-10-CM | POA: Diagnosis not present

## 2022-08-11 DIAGNOSIS — I429 Cardiomyopathy, unspecified: Secondary | ICD-10-CM | POA: Diagnosis not present

## 2022-08-11 DIAGNOSIS — H9190 Unspecified hearing loss, unspecified ear: Secondary | ICD-10-CM | POA: Diagnosis not present

## 2022-08-11 DIAGNOSIS — N1831 Chronic kidney disease, stage 3a: Secondary | ICD-10-CM | POA: Diagnosis not present

## 2022-08-11 DIAGNOSIS — I5042 Chronic combined systolic (congestive) and diastolic (congestive) heart failure: Secondary | ICD-10-CM | POA: Diagnosis not present

## 2022-08-11 DIAGNOSIS — S32029D Unspecified fracture of second lumbar vertebra, subsequent encounter for fracture with routine healing: Secondary | ICD-10-CM | POA: Diagnosis not present

## 2022-08-11 DIAGNOSIS — G4700 Insomnia, unspecified: Secondary | ICD-10-CM | POA: Diagnosis not present

## 2022-08-11 DIAGNOSIS — K219 Gastro-esophageal reflux disease without esophagitis: Secondary | ICD-10-CM | POA: Diagnosis not present

## 2022-08-11 DIAGNOSIS — E7800 Pure hypercholesterolemia, unspecified: Secondary | ICD-10-CM | POA: Diagnosis not present

## 2022-08-11 DIAGNOSIS — E041 Nontoxic single thyroid nodule: Secondary | ICD-10-CM | POA: Diagnosis not present

## 2022-08-12 DIAGNOSIS — I5042 Chronic combined systolic (congestive) and diastolic (congestive) heart failure: Secondary | ICD-10-CM | POA: Diagnosis not present

## 2022-08-12 DIAGNOSIS — E7800 Pure hypercholesterolemia, unspecified: Secondary | ICD-10-CM | POA: Diagnosis not present

## 2022-08-12 DIAGNOSIS — S32029D Unspecified fracture of second lumbar vertebra, subsequent encounter for fracture with routine healing: Secondary | ICD-10-CM | POA: Diagnosis not present

## 2022-08-12 DIAGNOSIS — F419 Anxiety disorder, unspecified: Secondary | ICD-10-CM | POA: Diagnosis not present

## 2022-08-12 DIAGNOSIS — D631 Anemia in chronic kidney disease: Secondary | ICD-10-CM | POA: Diagnosis not present

## 2022-08-12 DIAGNOSIS — G2581 Restless legs syndrome: Secondary | ICD-10-CM | POA: Diagnosis not present

## 2022-08-12 DIAGNOSIS — I130 Hypertensive heart and chronic kidney disease with heart failure and stage 1 through stage 4 chronic kidney disease, or unspecified chronic kidney disease: Secondary | ICD-10-CM | POA: Diagnosis not present

## 2022-08-12 DIAGNOSIS — N1831 Chronic kidney disease, stage 3a: Secondary | ICD-10-CM | POA: Diagnosis not present

## 2022-08-12 DIAGNOSIS — E041 Nontoxic single thyroid nodule: Secondary | ICD-10-CM | POA: Diagnosis not present

## 2022-08-12 DIAGNOSIS — H9190 Unspecified hearing loss, unspecified ear: Secondary | ICD-10-CM | POA: Diagnosis not present

## 2022-08-12 DIAGNOSIS — I429 Cardiomyopathy, unspecified: Secondary | ICD-10-CM | POA: Diagnosis not present

## 2022-08-12 DIAGNOSIS — M1612 Unilateral primary osteoarthritis, left hip: Secondary | ICD-10-CM | POA: Diagnosis not present

## 2022-08-12 DIAGNOSIS — F339 Major depressive disorder, recurrent, unspecified: Secondary | ICD-10-CM | POA: Diagnosis not present

## 2022-08-12 DIAGNOSIS — G4700 Insomnia, unspecified: Secondary | ICD-10-CM | POA: Diagnosis not present

## 2022-08-12 DIAGNOSIS — K219 Gastro-esophageal reflux disease without esophagitis: Secondary | ICD-10-CM | POA: Diagnosis not present

## 2022-08-12 DIAGNOSIS — I890 Lymphedema, not elsewhere classified: Secondary | ICD-10-CM | POA: Diagnosis not present

## 2022-08-16 ENCOUNTER — Other Ambulatory Visit: Payer: Self-pay | Admitting: Cardiology

## 2022-08-16 DIAGNOSIS — D631 Anemia in chronic kidney disease: Secondary | ICD-10-CM | POA: Diagnosis not present

## 2022-08-16 DIAGNOSIS — Z8673 Personal history of transient ischemic attack (TIA), and cerebral infarction without residual deficits: Secondary | ICD-10-CM | POA: Diagnosis not present

## 2022-08-16 DIAGNOSIS — Z9181 History of falling: Secondary | ICD-10-CM | POA: Diagnosis not present

## 2022-08-16 DIAGNOSIS — F419 Anxiety disorder, unspecified: Secondary | ICD-10-CM | POA: Diagnosis not present

## 2022-08-16 DIAGNOSIS — G4700 Insomnia, unspecified: Secondary | ICD-10-CM | POA: Diagnosis not present

## 2022-08-16 DIAGNOSIS — E041 Nontoxic single thyroid nodule: Secondary | ICD-10-CM | POA: Diagnosis not present

## 2022-08-16 DIAGNOSIS — E7800 Pure hypercholesterolemia, unspecified: Secondary | ICD-10-CM | POA: Diagnosis not present

## 2022-08-16 DIAGNOSIS — I429 Cardiomyopathy, unspecified: Secondary | ICD-10-CM | POA: Diagnosis not present

## 2022-08-16 DIAGNOSIS — E559 Vitamin D deficiency, unspecified: Secondary | ICD-10-CM | POA: Diagnosis not present

## 2022-08-16 DIAGNOSIS — I890 Lymphedema, not elsewhere classified: Secondary | ICD-10-CM | POA: Diagnosis not present

## 2022-08-16 DIAGNOSIS — Z7984 Long term (current) use of oral hypoglycemic drugs: Secondary | ICD-10-CM | POA: Diagnosis not present

## 2022-08-16 DIAGNOSIS — H9190 Unspecified hearing loss, unspecified ear: Secondary | ICD-10-CM | POA: Diagnosis not present

## 2022-08-16 DIAGNOSIS — S32029D Unspecified fracture of second lumbar vertebra, subsequent encounter for fracture with routine healing: Secondary | ICD-10-CM | POA: Diagnosis not present

## 2022-08-16 DIAGNOSIS — I5042 Chronic combined systolic (congestive) and diastolic (congestive) heart failure: Secondary | ICD-10-CM | POA: Diagnosis not present

## 2022-08-16 DIAGNOSIS — K219 Gastro-esophageal reflux disease without esophagitis: Secondary | ICD-10-CM | POA: Diagnosis not present

## 2022-08-16 DIAGNOSIS — N1831 Chronic kidney disease, stage 3a: Secondary | ICD-10-CM | POA: Diagnosis not present

## 2022-08-16 DIAGNOSIS — Z7982 Long term (current) use of aspirin: Secondary | ICD-10-CM | POA: Diagnosis not present

## 2022-08-16 DIAGNOSIS — Z96653 Presence of artificial knee joint, bilateral: Secondary | ICD-10-CM | POA: Diagnosis not present

## 2022-08-16 DIAGNOSIS — F339 Major depressive disorder, recurrent, unspecified: Secondary | ICD-10-CM | POA: Diagnosis not present

## 2022-08-16 DIAGNOSIS — G2581 Restless legs syndrome: Secondary | ICD-10-CM | POA: Diagnosis not present

## 2022-08-16 DIAGNOSIS — I130 Hypertensive heart and chronic kidney disease with heart failure and stage 1 through stage 4 chronic kidney disease, or unspecified chronic kidney disease: Secondary | ICD-10-CM | POA: Diagnosis not present

## 2022-08-16 DIAGNOSIS — Z6841 Body Mass Index (BMI) 40.0 and over, adult: Secondary | ICD-10-CM | POA: Diagnosis not present

## 2022-08-16 DIAGNOSIS — M1612 Unilateral primary osteoarthritis, left hip: Secondary | ICD-10-CM | POA: Diagnosis not present

## 2022-08-17 DIAGNOSIS — E041 Nontoxic single thyroid nodule: Secondary | ICD-10-CM | POA: Diagnosis not present

## 2022-08-17 DIAGNOSIS — F419 Anxiety disorder, unspecified: Secondary | ICD-10-CM | POA: Diagnosis not present

## 2022-08-17 DIAGNOSIS — I130 Hypertensive heart and chronic kidney disease with heart failure and stage 1 through stage 4 chronic kidney disease, or unspecified chronic kidney disease: Secondary | ICD-10-CM | POA: Diagnosis not present

## 2022-08-17 DIAGNOSIS — I890 Lymphedema, not elsewhere classified: Secondary | ICD-10-CM | POA: Diagnosis not present

## 2022-08-17 DIAGNOSIS — I5042 Chronic combined systolic (congestive) and diastolic (congestive) heart failure: Secondary | ICD-10-CM | POA: Diagnosis not present

## 2022-08-17 DIAGNOSIS — K219 Gastro-esophageal reflux disease without esophagitis: Secondary | ICD-10-CM | POA: Diagnosis not present

## 2022-08-17 DIAGNOSIS — I429 Cardiomyopathy, unspecified: Secondary | ICD-10-CM | POA: Diagnosis not present

## 2022-08-17 DIAGNOSIS — G2581 Restless legs syndrome: Secondary | ICD-10-CM | POA: Diagnosis not present

## 2022-08-17 DIAGNOSIS — E7800 Pure hypercholesterolemia, unspecified: Secondary | ICD-10-CM | POA: Diagnosis not present

## 2022-08-17 DIAGNOSIS — S32029D Unspecified fracture of second lumbar vertebra, subsequent encounter for fracture with routine healing: Secondary | ICD-10-CM | POA: Diagnosis not present

## 2022-08-17 DIAGNOSIS — F339 Major depressive disorder, recurrent, unspecified: Secondary | ICD-10-CM | POA: Diagnosis not present

## 2022-08-17 DIAGNOSIS — D631 Anemia in chronic kidney disease: Secondary | ICD-10-CM | POA: Diagnosis not present

## 2022-08-17 DIAGNOSIS — G4700 Insomnia, unspecified: Secondary | ICD-10-CM | POA: Diagnosis not present

## 2022-08-17 DIAGNOSIS — H9190 Unspecified hearing loss, unspecified ear: Secondary | ICD-10-CM | POA: Diagnosis not present

## 2022-08-17 DIAGNOSIS — M1612 Unilateral primary osteoarthritis, left hip: Secondary | ICD-10-CM | POA: Diagnosis not present

## 2022-08-17 DIAGNOSIS — N1831 Chronic kidney disease, stage 3a: Secondary | ICD-10-CM | POA: Diagnosis not present

## 2022-08-18 ENCOUNTER — Other Ambulatory Visit: Payer: Self-pay | Admitting: Cardiology

## 2022-08-18 DIAGNOSIS — M1612 Unilateral primary osteoarthritis, left hip: Secondary | ICD-10-CM | POA: Diagnosis not present

## 2022-08-18 DIAGNOSIS — I890 Lymphedema, not elsewhere classified: Secondary | ICD-10-CM | POA: Diagnosis not present

## 2022-08-18 DIAGNOSIS — H9190 Unspecified hearing loss, unspecified ear: Secondary | ICD-10-CM | POA: Diagnosis not present

## 2022-08-18 DIAGNOSIS — D631 Anemia in chronic kidney disease: Secondary | ICD-10-CM | POA: Diagnosis not present

## 2022-08-18 DIAGNOSIS — E7800 Pure hypercholesterolemia, unspecified: Secondary | ICD-10-CM | POA: Diagnosis not present

## 2022-08-18 DIAGNOSIS — G2581 Restless legs syndrome: Secondary | ICD-10-CM | POA: Diagnosis not present

## 2022-08-18 DIAGNOSIS — N1831 Chronic kidney disease, stage 3a: Secondary | ICD-10-CM | POA: Diagnosis not present

## 2022-08-18 DIAGNOSIS — F339 Major depressive disorder, recurrent, unspecified: Secondary | ICD-10-CM | POA: Diagnosis not present

## 2022-08-18 DIAGNOSIS — S32029D Unspecified fracture of second lumbar vertebra, subsequent encounter for fracture with routine healing: Secondary | ICD-10-CM | POA: Diagnosis not present

## 2022-08-18 DIAGNOSIS — I429 Cardiomyopathy, unspecified: Secondary | ICD-10-CM | POA: Diagnosis not present

## 2022-08-18 DIAGNOSIS — G4700 Insomnia, unspecified: Secondary | ICD-10-CM | POA: Diagnosis not present

## 2022-08-18 DIAGNOSIS — I130 Hypertensive heart and chronic kidney disease with heart failure and stage 1 through stage 4 chronic kidney disease, or unspecified chronic kidney disease: Secondary | ICD-10-CM | POA: Diagnosis not present

## 2022-08-18 DIAGNOSIS — E041 Nontoxic single thyroid nodule: Secondary | ICD-10-CM | POA: Diagnosis not present

## 2022-08-18 DIAGNOSIS — K219 Gastro-esophageal reflux disease without esophagitis: Secondary | ICD-10-CM | POA: Diagnosis not present

## 2022-08-18 DIAGNOSIS — F419 Anxiety disorder, unspecified: Secondary | ICD-10-CM | POA: Diagnosis not present

## 2022-08-18 DIAGNOSIS — I5042 Chronic combined systolic (congestive) and diastolic (congestive) heart failure: Secondary | ICD-10-CM | POA: Diagnosis not present

## 2022-08-20 ENCOUNTER — Other Ambulatory Visit: Payer: Self-pay | Admitting: Cardiology

## 2022-08-23 ENCOUNTER — Other Ambulatory Visit: Payer: Self-pay

## 2022-08-23 DIAGNOSIS — E041 Nontoxic single thyroid nodule: Secondary | ICD-10-CM | POA: Diagnosis not present

## 2022-08-23 DIAGNOSIS — D631 Anemia in chronic kidney disease: Secondary | ICD-10-CM | POA: Diagnosis not present

## 2022-08-23 DIAGNOSIS — K219 Gastro-esophageal reflux disease without esophagitis: Secondary | ICD-10-CM | POA: Diagnosis not present

## 2022-08-23 DIAGNOSIS — G2581 Restless legs syndrome: Secondary | ICD-10-CM | POA: Diagnosis not present

## 2022-08-23 DIAGNOSIS — I130 Hypertensive heart and chronic kidney disease with heart failure and stage 1 through stage 4 chronic kidney disease, or unspecified chronic kidney disease: Secondary | ICD-10-CM | POA: Diagnosis not present

## 2022-08-23 DIAGNOSIS — E7800 Pure hypercholesterolemia, unspecified: Secondary | ICD-10-CM | POA: Diagnosis not present

## 2022-08-23 DIAGNOSIS — H9190 Unspecified hearing loss, unspecified ear: Secondary | ICD-10-CM | POA: Diagnosis not present

## 2022-08-23 DIAGNOSIS — F339 Major depressive disorder, recurrent, unspecified: Secondary | ICD-10-CM | POA: Diagnosis not present

## 2022-08-23 DIAGNOSIS — I890 Lymphedema, not elsewhere classified: Secondary | ICD-10-CM | POA: Diagnosis not present

## 2022-08-23 DIAGNOSIS — N1831 Chronic kidney disease, stage 3a: Secondary | ICD-10-CM | POA: Diagnosis not present

## 2022-08-23 DIAGNOSIS — F419 Anxiety disorder, unspecified: Secondary | ICD-10-CM | POA: Diagnosis not present

## 2022-08-23 DIAGNOSIS — G4700 Insomnia, unspecified: Secondary | ICD-10-CM | POA: Diagnosis not present

## 2022-08-23 DIAGNOSIS — S32029D Unspecified fracture of second lumbar vertebra, subsequent encounter for fracture with routine healing: Secondary | ICD-10-CM | POA: Diagnosis not present

## 2022-08-23 DIAGNOSIS — I5042 Chronic combined systolic (congestive) and diastolic (congestive) heart failure: Secondary | ICD-10-CM | POA: Diagnosis not present

## 2022-08-23 DIAGNOSIS — I429 Cardiomyopathy, unspecified: Secondary | ICD-10-CM | POA: Diagnosis not present

## 2022-08-23 DIAGNOSIS — M1612 Unilateral primary osteoarthritis, left hip: Secondary | ICD-10-CM | POA: Diagnosis not present

## 2022-08-23 MED ORDER — MEXILETINE HCL 250 MG PO CAPS: 250.0000 mg | ORAL_CAPSULE | Freq: Two times a day (BID) | ORAL | 0 refills | Status: DC

## 2022-08-25 DIAGNOSIS — I10 Essential (primary) hypertension: Secondary | ICD-10-CM | POA: Diagnosis not present

## 2022-08-25 DIAGNOSIS — R7303 Prediabetes: Secondary | ICD-10-CM | POA: Diagnosis not present

## 2022-08-25 DIAGNOSIS — R110 Nausea: Secondary | ICD-10-CM | POA: Diagnosis not present

## 2022-08-25 DIAGNOSIS — D510 Vitamin B12 deficiency anemia due to intrinsic factor deficiency: Secondary | ICD-10-CM | POA: Diagnosis not present

## 2022-08-25 DIAGNOSIS — E7800 Pure hypercholesterolemia, unspecified: Secondary | ICD-10-CM | POA: Diagnosis not present

## 2022-08-25 DIAGNOSIS — E559 Vitamin D deficiency, unspecified: Secondary | ICD-10-CM | POA: Diagnosis not present

## 2022-08-25 DIAGNOSIS — M549 Dorsalgia, unspecified: Secondary | ICD-10-CM | POA: Diagnosis not present

## 2022-08-25 DIAGNOSIS — E538 Deficiency of other specified B group vitamins: Secondary | ICD-10-CM | POA: Diagnosis not present

## 2022-08-26 DIAGNOSIS — I5042 Chronic combined systolic (congestive) and diastolic (congestive) heart failure: Secondary | ICD-10-CM | POA: Diagnosis not present

## 2022-08-26 DIAGNOSIS — M1612 Unilateral primary osteoarthritis, left hip: Secondary | ICD-10-CM | POA: Diagnosis not present

## 2022-08-26 DIAGNOSIS — E041 Nontoxic single thyroid nodule: Secondary | ICD-10-CM | POA: Diagnosis not present

## 2022-08-26 DIAGNOSIS — G4700 Insomnia, unspecified: Secondary | ICD-10-CM | POA: Diagnosis not present

## 2022-08-26 DIAGNOSIS — G2581 Restless legs syndrome: Secondary | ICD-10-CM | POA: Diagnosis not present

## 2022-08-26 DIAGNOSIS — I130 Hypertensive heart and chronic kidney disease with heart failure and stage 1 through stage 4 chronic kidney disease, or unspecified chronic kidney disease: Secondary | ICD-10-CM | POA: Diagnosis not present

## 2022-08-26 DIAGNOSIS — N1831 Chronic kidney disease, stage 3a: Secondary | ICD-10-CM | POA: Diagnosis not present

## 2022-08-26 DIAGNOSIS — I429 Cardiomyopathy, unspecified: Secondary | ICD-10-CM | POA: Diagnosis not present

## 2022-08-26 DIAGNOSIS — E7800 Pure hypercholesterolemia, unspecified: Secondary | ICD-10-CM | POA: Diagnosis not present

## 2022-08-26 DIAGNOSIS — I890 Lymphedema, not elsewhere classified: Secondary | ICD-10-CM | POA: Diagnosis not present

## 2022-08-26 DIAGNOSIS — K219 Gastro-esophageal reflux disease without esophagitis: Secondary | ICD-10-CM | POA: Diagnosis not present

## 2022-08-26 DIAGNOSIS — D631 Anemia in chronic kidney disease: Secondary | ICD-10-CM | POA: Diagnosis not present

## 2022-08-26 DIAGNOSIS — F339 Major depressive disorder, recurrent, unspecified: Secondary | ICD-10-CM | POA: Diagnosis not present

## 2022-08-26 DIAGNOSIS — F419 Anxiety disorder, unspecified: Secondary | ICD-10-CM | POA: Diagnosis not present

## 2022-08-26 DIAGNOSIS — H9190 Unspecified hearing loss, unspecified ear: Secondary | ICD-10-CM | POA: Diagnosis not present

## 2022-08-26 DIAGNOSIS — S32029D Unspecified fracture of second lumbar vertebra, subsequent encounter for fracture with routine healing: Secondary | ICD-10-CM | POA: Diagnosis not present

## 2022-08-29 ENCOUNTER — Other Ambulatory Visit: Payer: Medicare Other

## 2022-08-29 DIAGNOSIS — I130 Hypertensive heart and chronic kidney disease with heart failure and stage 1 through stage 4 chronic kidney disease, or unspecified chronic kidney disease: Secondary | ICD-10-CM | POA: Diagnosis not present

## 2022-08-29 DIAGNOSIS — I890 Lymphedema, not elsewhere classified: Secondary | ICD-10-CM | POA: Diagnosis not present

## 2022-08-29 DIAGNOSIS — G2581 Restless legs syndrome: Secondary | ICD-10-CM | POA: Diagnosis not present

## 2022-08-29 DIAGNOSIS — I5042 Chronic combined systolic (congestive) and diastolic (congestive) heart failure: Secondary | ICD-10-CM | POA: Diagnosis not present

## 2022-08-29 DIAGNOSIS — K219 Gastro-esophageal reflux disease without esophagitis: Secondary | ICD-10-CM | POA: Diagnosis not present

## 2022-08-29 DIAGNOSIS — N1831 Chronic kidney disease, stage 3a: Secondary | ICD-10-CM | POA: Diagnosis not present

## 2022-08-29 DIAGNOSIS — E041 Nontoxic single thyroid nodule: Secondary | ICD-10-CM | POA: Diagnosis not present

## 2022-08-29 DIAGNOSIS — E7800 Pure hypercholesterolemia, unspecified: Secondary | ICD-10-CM | POA: Diagnosis not present

## 2022-08-29 DIAGNOSIS — I429 Cardiomyopathy, unspecified: Secondary | ICD-10-CM | POA: Diagnosis not present

## 2022-08-29 DIAGNOSIS — G4700 Insomnia, unspecified: Secondary | ICD-10-CM | POA: Diagnosis not present

## 2022-08-29 DIAGNOSIS — H9190 Unspecified hearing loss, unspecified ear: Secondary | ICD-10-CM | POA: Diagnosis not present

## 2022-08-29 DIAGNOSIS — S32029D Unspecified fracture of second lumbar vertebra, subsequent encounter for fracture with routine healing: Secondary | ICD-10-CM | POA: Diagnosis not present

## 2022-08-29 DIAGNOSIS — M1612 Unilateral primary osteoarthritis, left hip: Secondary | ICD-10-CM | POA: Diagnosis not present

## 2022-08-29 DIAGNOSIS — F419 Anxiety disorder, unspecified: Secondary | ICD-10-CM | POA: Diagnosis not present

## 2022-08-29 DIAGNOSIS — F339 Major depressive disorder, recurrent, unspecified: Secondary | ICD-10-CM | POA: Diagnosis not present

## 2022-08-29 DIAGNOSIS — D631 Anemia in chronic kidney disease: Secondary | ICD-10-CM | POA: Diagnosis not present

## 2022-08-30 ENCOUNTER — Other Ambulatory Visit: Payer: Self-pay

## 2022-08-30 DIAGNOSIS — G4700 Insomnia, unspecified: Secondary | ICD-10-CM | POA: Diagnosis not present

## 2022-08-30 DIAGNOSIS — E7800 Pure hypercholesterolemia, unspecified: Secondary | ICD-10-CM | POA: Diagnosis not present

## 2022-08-30 DIAGNOSIS — M1612 Unilateral primary osteoarthritis, left hip: Secondary | ICD-10-CM | POA: Diagnosis not present

## 2022-08-30 DIAGNOSIS — N1831 Chronic kidney disease, stage 3a: Secondary | ICD-10-CM | POA: Diagnosis not present

## 2022-08-30 DIAGNOSIS — K219 Gastro-esophageal reflux disease without esophagitis: Secondary | ICD-10-CM | POA: Diagnosis not present

## 2022-08-30 DIAGNOSIS — I130 Hypertensive heart and chronic kidney disease with heart failure and stage 1 through stage 4 chronic kidney disease, or unspecified chronic kidney disease: Secondary | ICD-10-CM | POA: Diagnosis not present

## 2022-08-30 DIAGNOSIS — F419 Anxiety disorder, unspecified: Secondary | ICD-10-CM | POA: Diagnosis not present

## 2022-08-30 DIAGNOSIS — F339 Major depressive disorder, recurrent, unspecified: Secondary | ICD-10-CM | POA: Diagnosis not present

## 2022-08-30 DIAGNOSIS — E041 Nontoxic single thyroid nodule: Secondary | ICD-10-CM | POA: Diagnosis not present

## 2022-08-30 DIAGNOSIS — D631 Anemia in chronic kidney disease: Secondary | ICD-10-CM | POA: Diagnosis not present

## 2022-08-30 DIAGNOSIS — H9190 Unspecified hearing loss, unspecified ear: Secondary | ICD-10-CM | POA: Diagnosis not present

## 2022-08-30 DIAGNOSIS — G2581 Restless legs syndrome: Secondary | ICD-10-CM | POA: Diagnosis not present

## 2022-08-30 DIAGNOSIS — I890 Lymphedema, not elsewhere classified: Secondary | ICD-10-CM | POA: Insufficient documentation

## 2022-08-30 DIAGNOSIS — I5042 Chronic combined systolic (congestive) and diastolic (congestive) heart failure: Secondary | ICD-10-CM | POA: Diagnosis not present

## 2022-08-30 DIAGNOSIS — S32029D Unspecified fracture of second lumbar vertebra, subsequent encounter for fracture with routine healing: Secondary | ICD-10-CM | POA: Diagnosis not present

## 2022-08-30 DIAGNOSIS — I429 Cardiomyopathy, unspecified: Secondary | ICD-10-CM | POA: Diagnosis not present

## 2022-08-30 HISTORY — DX: Lymphedema, not elsewhere classified: I89.0

## 2022-08-31 DIAGNOSIS — Z0000 Encounter for general adult medical examination without abnormal findings: Secondary | ICD-10-CM | POA: Diagnosis not present

## 2022-09-02 ENCOUNTER — Encounter: Payer: Self-pay | Admitting: Cardiology

## 2022-09-02 ENCOUNTER — Ambulatory Visit: Payer: Medicare Other | Attending: Cardiology | Admitting: Cardiology

## 2022-09-02 VITALS — BP 124/80 | HR 90 | Ht 67.0 in | Wt 320.1 lb

## 2022-09-02 DIAGNOSIS — Z6841 Body Mass Index (BMI) 40.0 and over, adult: Secondary | ICD-10-CM

## 2022-09-02 DIAGNOSIS — I10 Essential (primary) hypertension: Secondary | ICD-10-CM | POA: Diagnosis not present

## 2022-09-02 DIAGNOSIS — E782 Mixed hyperlipidemia: Secondary | ICD-10-CM | POA: Diagnosis not present

## 2022-09-02 DIAGNOSIS — S32030A Wedge compression fracture of third lumbar vertebra, initial encounter for closed fracture: Secondary | ICD-10-CM

## 2022-09-02 DIAGNOSIS — I5022 Chronic systolic (congestive) heart failure: Secondary | ICD-10-CM | POA: Diagnosis not present

## 2022-09-02 HISTORY — DX: Wedge compression fracture of third lumbar vertebra, initial encounter for closed fracture: S32.030A

## 2022-09-02 NOTE — Patient Instructions (Signed)

## 2022-09-02 NOTE — Progress Notes (Signed)
Cardiology Office Note:    Date:  09/02/2022   ID:  Paige [name], Paige [name] 10-22-1945, MRN [id number]  PCP:  Paige Floro, MD  Cardiologist:  Paige Brothers, MD   Referring MD: Paige Floro, MD    ASSESSMENT:    1. Chronic HFrEF (heart failure with reduced ejection fraction) (HCC)   2. Essential hypertension   3. Morbid obesity with BMI of 50.0-59.9, adult (HCC)   4. Mixed hyperlipidemia    PLAN:    In order of problems listed above:  Primary prevention stressed with the patient.  Importance of compliance with diet medication stressed and patient verbalized standing. Cardiomyopathy with moderately decreased ejection fraction: She is on appropriate therapy.  I wanted to check ejection fraction but she is not significant back pain and does not want to do that for the next several months.  I respect her wishes.  She will continue her current medications. Essential hypertension: Blood pressure stable and diet was emphasized. Mixed dyslipidemia: On lipid-lowering medications followed by primary care.  Blood work from primary care was reviewed and discussed with her.  Lipids are fine. Morbid obesity: Weight reduction stressed diet emphasized.  Risks of obesity stressed she promises to do better. Patient will be seen in follow-up appointment in 9 months or earlier if the patient has any concerns.    Medication Adjustments/Labs and Tests Ordered: Current medicines are reviewed at length with the patient today.  Concerns regarding medicines are outlined above.  No orders of the defined types were placed in this encounter.  No orders of the defined types were placed in this encounter.    No chief complaint on file.    History of Present Illness:    Paige [name] is a 77 y.o. female.  Patient has past medical history of essential hypertension, dyslipidemia, moderately depressed ejection fraction and morbid obesity.  She had a fall unfortunately.  She was trying to pick  a package from her door fell backwards and had fractures of the vertebral column.  She is getting therapy and intervention for this.  She has done fine with that.  No chest pain orthopnea or PND.  At the time of my evaluation, the patient is alert awake oriented and in no distress.  Past Medical History:  Diagnosis Date   Acute exacerbation of CHF (congestive heart failure) (HCC) 05/04/2020   Acute respiratory failure with hypoxia (HCC) 05/08/2020   Anemia    Anxiety disorder 05/04/2020   Arthritis    Arthritis of knee 09/25/2013   Arthritis of knee, left 09/25/2013   Arthritis of right knee 02/10/2014   Back pain    Chronic HFrEF (heart failure with reduced ejection fraction) (HCC) 06/22/2022   Chronic kidney disease, stage 3a (HCC) 05/04/2020   Class 3 severe obesity with serious comorbidity and body mass index (BMI) of 45.0 to 49.9 in adult Paige [name]) 03/29/2018   Congestive heart failure (HCC) 06/01/2021   Depression    Elevated troponin 05/08/2020   Esophageal reflux 05/04/2020   Essential hypertension 05/08/2020   Fatigue    First degree AV block 06/22/2022   Gallbladder problem    H/O hiatal hernia    Hearing loss 06/01/2021   High cholesterol 05/04/2020   Hyperlipidemia 05/08/2020   Insomnia 06/01/2021   Insulin resistance 03/29/2018   Interscapular region contusion 06/01/2021   Joint pain    Lumbar vertebral fracture (HCC) 06/22/2022   Lymphedema 08/30/2022   Lymphedema of both lower extremities  Morbid obesity with BMI of 50.0-59.9, adult (HCC) 05/08/2020   Muscle pain 06/01/2021   Obesity    Osteoarthritis    Other long term (current) drug therapy 06/01/2021   Palpitations 11/24/2020   Perennial allergic rhinitis 01/10/2018   Pernicious anemia 06/01/2021   PONV (postoperative nausea and vomiting)    after Cholecysectomy   Prediabetes 06/01/2021   Primary osteoarthritis of left hip 11/26/2015   Primary osteoarthritis of right knee 02/09/2014   PVC's (premature  ventricular contractions) 06/22/2022   Restless leg syndrome 05/08/2020   Rhinitis medicamentosa 01/10/2018   Sleep disturbance 06/01/2021   Systolic and diastolic CHF, acute (HCC) 05/08/2020   Thyroid nodule 06/22/2022   Tremor 06/01/2021   Urinary incontinence 06/01/2021   Vasomotor rhinitis 01/10/2018   Vertigo    Vitamin B 12 deficiency    Vitamin D deficiency 03/29/2018    Past Surgical History:  Procedure Laterality Date   ABDOMINAL HYSTERECTOMY     CHOLECYSTECTOMY     COLONOSCOPY     EYE SURGERY Bilateral    cataracts   FRACTURE SURGERY Right    arm age 8   FRACTURE SURGERY Bilateral    both thumbs   IR KYPHO LUMBAR INC FX REDUCE BONE BX UNI/BIL CANNULATION INC/IMAGING  06/27/2022   LAPAROSCOPIC GASTRIC BANDING     2010   OVARIAN CYST REMOVAL     RIGHT/LEFT HEART CATH AND CORONARY ANGIOGRAPHY N/A 05/07/2020   Procedure: RIGHT/LEFT HEART CATH AND CORONARY ANGIOGRAPHY;  Surgeon: Paige Cobb, MD;  Location: [facility];  Service: Cardiovascular;  Laterality: N/A;   TOTAL HIP ARTHROPLASTY Left 11/30/2015   Procedure: TOTAL HIP ARTHROPLASTY ANTERIOR APPROACH;  Surgeon: Paige Birchwood, MD;  Location: [facility] OR;  Service: Orthopedics;  Laterality: Left;   TOTAL KNEE ARTHROPLASTY Left 09/25/2013   Procedure: TOTAL KNEE ARTHROPLASTY;  Surgeon: Paige Lewandowsky, MD;  Location: [facility] OR;  Service: Orthopedics;  Laterality: Left;   TOTAL KNEE ARTHROPLASTY Right 02/10/2014   dr Paige [name]   TOTAL KNEE ARTHROPLASTY Right 02/10/2014   Procedure: TOTAL KNEE ARTHROPLASTY;  Surgeon: Paige Lewandowsky, MD;  Location: [facility] OR;  Service: Orthopedics;  Laterality: Right;    Current Medications: Current Meds  Medication Sig   aspirin 81 MG chewable tablet Chew 81 mg by mouth daily.   atorvastatin (LIPITOR) 40 MG tablet Take 1 tablet (40 mg total) by mouth daily.   buPROPion (WELLBUTRIN XL) 150 MG 24 hr tablet Take 150 mg by mouth every morning.   carvedilol (COREG) 6.25 MG tablet Take 6.25 mg by mouth 2  (two) times daily.   DULoxetine (CYMBALTA) 60 MG capsule Take 60 mg by mouth daily.   furosemide (LASIX) 40 MG tablet Take 40 mg by mouth daily as needed for fluid or edema.   JARDIANCE 25 MG TABS tablet Take 25 mg by mouth daily.   methocarbamol (ROBAXIN) 500 MG tablet Take 500-1,000 mg by mouth every 8 (eight) hours.   mexiletine (MEXITIL) 250 MG capsule Take 1 capsule (250 mg total) by mouth 2 (two) times daily.   oxyCODONE (OXY IR/ROXICODONE) 5 MG immediate release tablet Take 1-2 tablets (5-10 mg total) by mouth every 6 (six) hours as needed for moderate pain or severe pain.   Phenylephrine HCl (SINEX REGULAR NA) Place 1 spray into both nostrils daily as needed for congestion (congestion).   pramipexole (MIRAPEX) 0.25 MG tablet Take 0.25-0.5 mg by mouth See admin instructions. Take 1 tablet (0.25 mg) in the morning and take 2 tablets (0.5  mg) at bedtime   raloxifene (EVISTA) 60 MG tablet Take 60 mg by mouth daily.   sacubitril-valsartan (ENTRESTO) 24-26 MG Take 1 tablet by mouth 2 (two) times daily.   spironolactone (ALDACTONE) 25 MG tablet Take 0.5 tablets (12.5 mg total) by mouth daily.   zolpidem (AMBIEN) 10 MG tablet Take 10 mg by mouth at bedtime.     Allergies:   No known allergies   Social History   Socioeconomic History   Marital status: Widowed    Spouse name: Not on file   Number of children: Not on file   Years of education: Not on file   Highest education level: Not on file  Occupational History   Occupation: Retired  Tobacco Use   Smoking status: Never   Smokeless tobacco: Never  Substance and Sexual Activity   Alcohol use: Yes    Comment: rarely   Drug use: No   Sexual activity: Not on file  Other Topics Concern   Not on file  Social History Narrative   Not on file   Social Determinants of Health   Financial Resource Strain: Low Risk  (07/01/2020)   Overall Financial Resource Strain (CARDIA)    Difficulty of Paying Living Expenses: Not hard at all  Food  Insecurity: No Food Insecurity (06/22/2022)   Hunger Vital Sign    Worried About Running Out of Food in the Last Year: Never true    Ran Out of Food in the Last Year: Never true  Transportation Needs: No Transportation Needs (06/22/2022)   PRAPARE - Administrator, Civil Service (Medical): No    Lack of Transportation (Non-Medical): No  Physical Activity: Not on file  Stress: Not on file  Social Connections: Not on file     Family History: The patient's family history includes Depression in her mother; Hypertension in her mother.  ROS:   Please see the history of present illness.    All other systems reviewed and are negative.  EKGs/Labs/Other Studies Reviewed:    The following studies were reviewed today: I discussed my findings with the patient at length   Recent Labs: 06/21/2022: ALT 25 06/22/2022: TSH 2.961 07/14/2022: BUN 20; Creatinine, Ser 1.08; Hemoglobin 16.7; Platelets 420; Potassium 4.5; Sodium 134  Recent Lipid Panel    Component Value Date/Time   CHOL 119 11/26/2020 1554   TRIG 73 11/26/2020 1554   HDL 52 11/26/2020 1554   CHOLHDL 2.3 11/26/2020 1554   CHOLHDL 3.4 05/08/2020 0133   VLDL 17 05/08/2020 0133   LDLCALC 52 11/26/2020 1554    Physical Exam:    VS:  BP 124/80   Pulse 90   Ht 5\' 7"  (1.702 m)   Wt (!) 320 lb 1.3 oz (145.2 kg)   SpO2 92%   BMI 50.13 kg/m     Wt Readings from Last 3 Encounters:  09/02/22 (!) 320 lb 1.3 oz (145.2 kg)  06/22/22 (!) 336 lb 14.4 oz (152.8 kg)  10/18/21 (!) 315 lb (142.9 kg)     GEN: Patient is in no acute distress HEENT: Normal NECK: No JVD; No carotid bruits LYMPHATICS: No lymphadenopathy CARDIAC: Hear sounds regular, 2/6 systolic murmur at the apex. RESPIRATORY:  Clear to auscultation without rales, wheezing or rhonchi  ABDOMEN: Soft, non-tender, non-distended MUSCULOSKELETAL:  No edema; No deformity  SKIN: Warm and dry NEUROLOGIC:  Alert and oriented x 3 PSYCHIATRIC:  Normal affect    Signed, Paige Brothers, MD  09/02/2022 1:28 PM  [name]

## 2022-09-05 ENCOUNTER — Other Ambulatory Visit: Payer: Medicare Other

## 2022-09-07 DIAGNOSIS — I5042 Chronic combined systolic (congestive) and diastolic (congestive) heart failure: Secondary | ICD-10-CM | POA: Diagnosis not present

## 2022-09-07 DIAGNOSIS — E041 Nontoxic single thyroid nodule: Secondary | ICD-10-CM | POA: Diagnosis not present

## 2022-09-07 DIAGNOSIS — S32029D Unspecified fracture of second lumbar vertebra, subsequent encounter for fracture with routine healing: Secondary | ICD-10-CM | POA: Diagnosis not present

## 2022-09-07 DIAGNOSIS — K219 Gastro-esophageal reflux disease without esophagitis: Secondary | ICD-10-CM | POA: Diagnosis not present

## 2022-09-07 DIAGNOSIS — M1612 Unilateral primary osteoarthritis, left hip: Secondary | ICD-10-CM | POA: Diagnosis not present

## 2022-09-07 DIAGNOSIS — F419 Anxiety disorder, unspecified: Secondary | ICD-10-CM | POA: Diagnosis not present

## 2022-09-07 DIAGNOSIS — F339 Major depressive disorder, recurrent, unspecified: Secondary | ICD-10-CM | POA: Diagnosis not present

## 2022-09-07 DIAGNOSIS — I130 Hypertensive heart and chronic kidney disease with heart failure and stage 1 through stage 4 chronic kidney disease, or unspecified chronic kidney disease: Secondary | ICD-10-CM | POA: Diagnosis not present

## 2022-09-07 DIAGNOSIS — E7800 Pure hypercholesterolemia, unspecified: Secondary | ICD-10-CM | POA: Diagnosis not present

## 2022-09-07 DIAGNOSIS — H9190 Unspecified hearing loss, unspecified ear: Secondary | ICD-10-CM | POA: Diagnosis not present

## 2022-09-07 DIAGNOSIS — I429 Cardiomyopathy, unspecified: Secondary | ICD-10-CM | POA: Diagnosis not present

## 2022-09-07 DIAGNOSIS — N1831 Chronic kidney disease, stage 3a: Secondary | ICD-10-CM | POA: Diagnosis not present

## 2022-09-07 DIAGNOSIS — D631 Anemia in chronic kidney disease: Secondary | ICD-10-CM | POA: Diagnosis not present

## 2022-09-07 DIAGNOSIS — G4700 Insomnia, unspecified: Secondary | ICD-10-CM | POA: Diagnosis not present

## 2022-09-07 DIAGNOSIS — I890 Lymphedema, not elsewhere classified: Secondary | ICD-10-CM | POA: Diagnosis not present

## 2022-09-07 DIAGNOSIS — G2581 Restless legs syndrome: Secondary | ICD-10-CM | POA: Diagnosis not present

## 2022-09-08 ENCOUNTER — Other Ambulatory Visit: Payer: Self-pay | Admitting: Cardiology

## 2022-09-08 DIAGNOSIS — E041 Nontoxic single thyroid nodule: Secondary | ICD-10-CM | POA: Diagnosis not present

## 2022-09-08 DIAGNOSIS — F419 Anxiety disorder, unspecified: Secondary | ICD-10-CM | POA: Diagnosis not present

## 2022-09-08 DIAGNOSIS — F339 Major depressive disorder, recurrent, unspecified: Secondary | ICD-10-CM | POA: Diagnosis not present

## 2022-09-08 DIAGNOSIS — K219 Gastro-esophageal reflux disease without esophagitis: Secondary | ICD-10-CM | POA: Diagnosis not present

## 2022-09-08 DIAGNOSIS — G2581 Restless legs syndrome: Secondary | ICD-10-CM | POA: Diagnosis not present

## 2022-09-08 DIAGNOSIS — E7800 Pure hypercholesterolemia, unspecified: Secondary | ICD-10-CM | POA: Diagnosis not present

## 2022-09-08 DIAGNOSIS — N1831 Chronic kidney disease, stage 3a: Secondary | ICD-10-CM | POA: Diagnosis not present

## 2022-09-08 DIAGNOSIS — I130 Hypertensive heart and chronic kidney disease with heart failure and stage 1 through stage 4 chronic kidney disease, or unspecified chronic kidney disease: Secondary | ICD-10-CM | POA: Diagnosis not present

## 2022-09-08 DIAGNOSIS — I890 Lymphedema, not elsewhere classified: Secondary | ICD-10-CM | POA: Diagnosis not present

## 2022-09-08 DIAGNOSIS — I5042 Chronic combined systolic (congestive) and diastolic (congestive) heart failure: Secondary | ICD-10-CM | POA: Diagnosis not present

## 2022-09-08 DIAGNOSIS — S32029D Unspecified fracture of second lumbar vertebra, subsequent encounter for fracture with routine healing: Secondary | ICD-10-CM | POA: Diagnosis not present

## 2022-09-08 DIAGNOSIS — M1612 Unilateral primary osteoarthritis, left hip: Secondary | ICD-10-CM | POA: Diagnosis not present

## 2022-09-08 DIAGNOSIS — H9190 Unspecified hearing loss, unspecified ear: Secondary | ICD-10-CM | POA: Diagnosis not present

## 2022-09-08 DIAGNOSIS — G4700 Insomnia, unspecified: Secondary | ICD-10-CM | POA: Diagnosis not present

## 2022-09-08 DIAGNOSIS — D631 Anemia in chronic kidney disease: Secondary | ICD-10-CM | POA: Diagnosis not present

## 2022-09-08 DIAGNOSIS — I429 Cardiomyopathy, unspecified: Secondary | ICD-10-CM | POA: Diagnosis not present

## 2022-09-13 ENCOUNTER — Ambulatory Visit: Admission: RE | Admit: 2022-09-13 | Payer: Medicare Other | Source: Ambulatory Visit

## 2022-09-13 DIAGNOSIS — E042 Nontoxic multinodular goiter: Secondary | ICD-10-CM | POA: Diagnosis not present

## 2022-09-13 DIAGNOSIS — E041 Nontoxic single thyroid nodule: Secondary | ICD-10-CM

## 2022-09-16 DIAGNOSIS — F339 Major depressive disorder, recurrent, unspecified: Secondary | ICD-10-CM | POA: Diagnosis not present

## 2022-09-16 DIAGNOSIS — E559 Vitamin D deficiency, unspecified: Secondary | ICD-10-CM | POA: Diagnosis not present

## 2022-09-16 DIAGNOSIS — D631 Anemia in chronic kidney disease: Secondary | ICD-10-CM | POA: Diagnosis not present

## 2022-09-16 DIAGNOSIS — G4700 Insomnia, unspecified: Secondary | ICD-10-CM | POA: Diagnosis not present

## 2022-09-16 DIAGNOSIS — Z7984 Long term (current) use of oral hypoglycemic drugs: Secondary | ICD-10-CM | POA: Diagnosis not present

## 2022-09-16 DIAGNOSIS — S32029D Unspecified fracture of second lumbar vertebra, subsequent encounter for fracture with routine healing: Secondary | ICD-10-CM | POA: Diagnosis not present

## 2022-09-16 DIAGNOSIS — G2581 Restless legs syndrome: Secondary | ICD-10-CM | POA: Diagnosis not present

## 2022-09-16 DIAGNOSIS — K219 Gastro-esophageal reflux disease without esophagitis: Secondary | ICD-10-CM | POA: Diagnosis not present

## 2022-09-16 DIAGNOSIS — N1831 Chronic kidney disease, stage 3a: Secondary | ICD-10-CM | POA: Diagnosis not present

## 2022-09-16 DIAGNOSIS — Z6841 Body Mass Index (BMI) 40.0 and over, adult: Secondary | ICD-10-CM | POA: Diagnosis not present

## 2022-09-16 DIAGNOSIS — Z602 Problems related to living alone: Secondary | ICD-10-CM | POA: Diagnosis not present

## 2022-09-16 DIAGNOSIS — Z7982 Long term (current) use of aspirin: Secondary | ICD-10-CM | POA: Diagnosis not present

## 2022-09-16 DIAGNOSIS — Z96653 Presence of artificial knee joint, bilateral: Secondary | ICD-10-CM | POA: Diagnosis not present

## 2022-09-16 DIAGNOSIS — F419 Anxiety disorder, unspecified: Secondary | ICD-10-CM | POA: Diagnosis not present

## 2022-09-16 DIAGNOSIS — M1612 Unilateral primary osteoarthritis, left hip: Secondary | ICD-10-CM | POA: Diagnosis not present

## 2022-09-16 DIAGNOSIS — E7800 Pure hypercholesterolemia, unspecified: Secondary | ICD-10-CM | POA: Diagnosis not present

## 2022-09-16 DIAGNOSIS — I5042 Chronic combined systolic (congestive) and diastolic (congestive) heart failure: Secondary | ICD-10-CM | POA: Diagnosis not present

## 2022-09-16 DIAGNOSIS — H9190 Unspecified hearing loss, unspecified ear: Secondary | ICD-10-CM | POA: Diagnosis not present

## 2022-09-16 DIAGNOSIS — I130 Hypertensive heart and chronic kidney disease with heart failure and stage 1 through stage 4 chronic kidney disease, or unspecified chronic kidney disease: Secondary | ICD-10-CM | POA: Diagnosis not present

## 2022-09-16 DIAGNOSIS — I890 Lymphedema, not elsewhere classified: Secondary | ICD-10-CM | POA: Diagnosis not present

## 2022-09-16 DIAGNOSIS — Z8673 Personal history of transient ischemic attack (TIA), and cerebral infarction without residual deficits: Secondary | ICD-10-CM | POA: Diagnosis not present

## 2022-09-16 DIAGNOSIS — I429 Cardiomyopathy, unspecified: Secondary | ICD-10-CM | POA: Diagnosis not present

## 2022-09-16 DIAGNOSIS — E041 Nontoxic single thyroid nodule: Secondary | ICD-10-CM | POA: Diagnosis not present

## 2022-09-16 DIAGNOSIS — Z9181 History of falling: Secondary | ICD-10-CM | POA: Diagnosis not present

## 2022-09-21 ENCOUNTER — Other Ambulatory Visit: Payer: Self-pay | Admitting: Family Medicine

## 2022-09-21 DIAGNOSIS — D631 Anemia in chronic kidney disease: Secondary | ICD-10-CM | POA: Diagnosis not present

## 2022-09-21 DIAGNOSIS — I5042 Chronic combined systolic (congestive) and diastolic (congestive) heart failure: Secondary | ICD-10-CM | POA: Diagnosis not present

## 2022-09-21 DIAGNOSIS — E041 Nontoxic single thyroid nodule: Secondary | ICD-10-CM | POA: Diagnosis not present

## 2022-09-21 DIAGNOSIS — G4700 Insomnia, unspecified: Secondary | ICD-10-CM | POA: Diagnosis not present

## 2022-09-21 DIAGNOSIS — F339 Major depressive disorder, recurrent, unspecified: Secondary | ICD-10-CM | POA: Diagnosis not present

## 2022-09-21 DIAGNOSIS — S32029D Unspecified fracture of second lumbar vertebra, subsequent encounter for fracture with routine healing: Secondary | ICD-10-CM | POA: Diagnosis not present

## 2022-09-21 DIAGNOSIS — N1831 Chronic kidney disease, stage 3a: Secondary | ICD-10-CM | POA: Diagnosis not present

## 2022-09-21 DIAGNOSIS — F419 Anxiety disorder, unspecified: Secondary | ICD-10-CM | POA: Diagnosis not present

## 2022-09-21 DIAGNOSIS — G2581 Restless legs syndrome: Secondary | ICD-10-CM | POA: Diagnosis not present

## 2022-09-21 DIAGNOSIS — I890 Lymphedema, not elsewhere classified: Secondary | ICD-10-CM | POA: Diagnosis not present

## 2022-09-21 DIAGNOSIS — I429 Cardiomyopathy, unspecified: Secondary | ICD-10-CM | POA: Diagnosis not present

## 2022-09-21 DIAGNOSIS — H9190 Unspecified hearing loss, unspecified ear: Secondary | ICD-10-CM | POA: Diagnosis not present

## 2022-09-21 DIAGNOSIS — M1612 Unilateral primary osteoarthritis, left hip: Secondary | ICD-10-CM | POA: Diagnosis not present

## 2022-09-21 DIAGNOSIS — K219 Gastro-esophageal reflux disease without esophagitis: Secondary | ICD-10-CM | POA: Diagnosis not present

## 2022-09-21 DIAGNOSIS — I130 Hypertensive heart and chronic kidney disease with heart failure and stage 1 through stage 4 chronic kidney disease, or unspecified chronic kidney disease: Secondary | ICD-10-CM | POA: Diagnosis not present

## 2022-09-21 DIAGNOSIS — E7800 Pure hypercholesterolemia, unspecified: Secondary | ICD-10-CM | POA: Diagnosis not present

## 2022-09-23 DIAGNOSIS — E041 Nontoxic single thyroid nodule: Secondary | ICD-10-CM | POA: Diagnosis not present

## 2022-09-28 DIAGNOSIS — E7800 Pure hypercholesterolemia, unspecified: Secondary | ICD-10-CM | POA: Diagnosis not present

## 2022-09-28 DIAGNOSIS — I429 Cardiomyopathy, unspecified: Secondary | ICD-10-CM | POA: Diagnosis not present

## 2022-09-28 DIAGNOSIS — N1831 Chronic kidney disease, stage 3a: Secondary | ICD-10-CM | POA: Diagnosis not present

## 2022-09-28 DIAGNOSIS — K219 Gastro-esophageal reflux disease without esophagitis: Secondary | ICD-10-CM | POA: Diagnosis not present

## 2022-09-28 DIAGNOSIS — G2581 Restless legs syndrome: Secondary | ICD-10-CM | POA: Diagnosis not present

## 2022-09-28 DIAGNOSIS — I130 Hypertensive heart and chronic kidney disease with heart failure and stage 1 through stage 4 chronic kidney disease, or unspecified chronic kidney disease: Secondary | ICD-10-CM | POA: Diagnosis not present

## 2022-09-28 DIAGNOSIS — D631 Anemia in chronic kidney disease: Secondary | ICD-10-CM | POA: Diagnosis not present

## 2022-09-28 DIAGNOSIS — F419 Anxiety disorder, unspecified: Secondary | ICD-10-CM | POA: Diagnosis not present

## 2022-09-28 DIAGNOSIS — H9190 Unspecified hearing loss, unspecified ear: Secondary | ICD-10-CM | POA: Diagnosis not present

## 2022-09-28 DIAGNOSIS — F339 Major depressive disorder, recurrent, unspecified: Secondary | ICD-10-CM | POA: Diagnosis not present

## 2022-09-28 DIAGNOSIS — I890 Lymphedema, not elsewhere classified: Secondary | ICD-10-CM | POA: Diagnosis not present

## 2022-09-28 DIAGNOSIS — G4700 Insomnia, unspecified: Secondary | ICD-10-CM | POA: Diagnosis not present

## 2022-09-28 DIAGNOSIS — E041 Nontoxic single thyroid nodule: Secondary | ICD-10-CM | POA: Diagnosis not present

## 2022-09-28 DIAGNOSIS — I5042 Chronic combined systolic (congestive) and diastolic (congestive) heart failure: Secondary | ICD-10-CM | POA: Diagnosis not present

## 2022-09-28 DIAGNOSIS — S32029D Unspecified fracture of second lumbar vertebra, subsequent encounter for fracture with routine healing: Secondary | ICD-10-CM | POA: Diagnosis not present

## 2022-09-28 DIAGNOSIS — M1612 Unilateral primary osteoarthritis, left hip: Secondary | ICD-10-CM | POA: Diagnosis not present

## 2022-10-07 DIAGNOSIS — I5042 Chronic combined systolic (congestive) and diastolic (congestive) heart failure: Secondary | ICD-10-CM | POA: Diagnosis not present

## 2022-10-07 DIAGNOSIS — G4700 Insomnia, unspecified: Secondary | ICD-10-CM | POA: Diagnosis not present

## 2022-10-07 DIAGNOSIS — K219 Gastro-esophageal reflux disease without esophagitis: Secondary | ICD-10-CM | POA: Diagnosis not present

## 2022-10-07 DIAGNOSIS — D631 Anemia in chronic kidney disease: Secondary | ICD-10-CM | POA: Diagnosis not present

## 2022-10-07 DIAGNOSIS — G2581 Restless legs syndrome: Secondary | ICD-10-CM | POA: Diagnosis not present

## 2022-10-07 DIAGNOSIS — S32029D Unspecified fracture of second lumbar vertebra, subsequent encounter for fracture with routine healing: Secondary | ICD-10-CM | POA: Diagnosis not present

## 2022-10-07 DIAGNOSIS — I130 Hypertensive heart and chronic kidney disease with heart failure and stage 1 through stage 4 chronic kidney disease, or unspecified chronic kidney disease: Secondary | ICD-10-CM | POA: Diagnosis not present

## 2022-10-07 DIAGNOSIS — I890 Lymphedema, not elsewhere classified: Secondary | ICD-10-CM | POA: Diagnosis not present

## 2022-10-07 DIAGNOSIS — F419 Anxiety disorder, unspecified: Secondary | ICD-10-CM | POA: Diagnosis not present

## 2022-10-07 DIAGNOSIS — N1831 Chronic kidney disease, stage 3a: Secondary | ICD-10-CM | POA: Diagnosis not present

## 2022-10-07 DIAGNOSIS — M1612 Unilateral primary osteoarthritis, left hip: Secondary | ICD-10-CM | POA: Diagnosis not present

## 2022-10-07 DIAGNOSIS — E041 Nontoxic single thyroid nodule: Secondary | ICD-10-CM | POA: Diagnosis not present

## 2022-10-07 DIAGNOSIS — E7800 Pure hypercholesterolemia, unspecified: Secondary | ICD-10-CM | POA: Diagnosis not present

## 2022-10-07 DIAGNOSIS — I429 Cardiomyopathy, unspecified: Secondary | ICD-10-CM | POA: Diagnosis not present

## 2022-10-07 DIAGNOSIS — H9190 Unspecified hearing loss, unspecified ear: Secondary | ICD-10-CM | POA: Diagnosis not present

## 2022-10-07 DIAGNOSIS — F339 Major depressive disorder, recurrent, unspecified: Secondary | ICD-10-CM | POA: Diagnosis not present

## 2022-10-10 DIAGNOSIS — E041 Nontoxic single thyroid nodule: Secondary | ICD-10-CM | POA: Diagnosis not present

## 2022-10-10 DIAGNOSIS — I890 Lymphedema, not elsewhere classified: Secondary | ICD-10-CM | POA: Diagnosis not present

## 2022-10-10 DIAGNOSIS — I5042 Chronic combined systolic (congestive) and diastolic (congestive) heart failure: Secondary | ICD-10-CM | POA: Diagnosis not present

## 2022-10-10 DIAGNOSIS — E7800 Pure hypercholesterolemia, unspecified: Secondary | ICD-10-CM | POA: Diagnosis not present

## 2022-10-10 DIAGNOSIS — K219 Gastro-esophageal reflux disease without esophagitis: Secondary | ICD-10-CM | POA: Diagnosis not present

## 2022-10-10 DIAGNOSIS — N1831 Chronic kidney disease, stage 3a: Secondary | ICD-10-CM | POA: Diagnosis not present

## 2022-10-10 DIAGNOSIS — I130 Hypertensive heart and chronic kidney disease with heart failure and stage 1 through stage 4 chronic kidney disease, or unspecified chronic kidney disease: Secondary | ICD-10-CM | POA: Diagnosis not present

## 2022-10-10 DIAGNOSIS — I429 Cardiomyopathy, unspecified: Secondary | ICD-10-CM | POA: Diagnosis not present

## 2022-10-10 DIAGNOSIS — F419 Anxiety disorder, unspecified: Secondary | ICD-10-CM | POA: Diagnosis not present

## 2022-10-10 DIAGNOSIS — G4700 Insomnia, unspecified: Secondary | ICD-10-CM | POA: Diagnosis not present

## 2022-10-10 DIAGNOSIS — H9190 Unspecified hearing loss, unspecified ear: Secondary | ICD-10-CM | POA: Diagnosis not present

## 2022-10-10 DIAGNOSIS — F339 Major depressive disorder, recurrent, unspecified: Secondary | ICD-10-CM | POA: Diagnosis not present

## 2022-10-10 DIAGNOSIS — D631 Anemia in chronic kidney disease: Secondary | ICD-10-CM | POA: Diagnosis not present

## 2022-10-10 DIAGNOSIS — S32029D Unspecified fracture of second lumbar vertebra, subsequent encounter for fracture with routine healing: Secondary | ICD-10-CM | POA: Diagnosis not present

## 2022-10-10 DIAGNOSIS — G2581 Restless legs syndrome: Secondary | ICD-10-CM | POA: Diagnosis not present

## 2022-10-10 DIAGNOSIS — M1612 Unilateral primary osteoarthritis, left hip: Secondary | ICD-10-CM | POA: Diagnosis not present

## 2022-11-02 ENCOUNTER — Other Ambulatory Visit: Payer: Self-pay | Admitting: Cardiology

## 2022-11-02 DIAGNOSIS — I5042 Chronic combined systolic (congestive) and diastolic (congestive) heart failure: Secondary | ICD-10-CM

## 2022-11-11 ENCOUNTER — Other Ambulatory Visit (HOSPITAL_COMMUNITY)
Admission: RE | Admit: 2022-11-11 | Discharge: 2022-11-11 | Disposition: A | Discharge status: Home / Self Care | Payer: Medicare Other | Source: Ambulatory Visit | Attending: Interventional Radiology | Admitting: Interventional Radiology

## 2022-11-11 ENCOUNTER — Ambulatory Visit
Admission: RE | Admit: 2022-11-11 | Discharge: 2022-11-11 | Disposition: A | Discharge status: Home / Self Care | Payer: Medicare Other | Source: Ambulatory Visit | Attending: Family Medicine | Admitting: Family Medicine

## 2022-11-11 DIAGNOSIS — E041 Nontoxic single thyroid nodule: Secondary | ICD-10-CM | POA: Insufficient documentation

## 2022-11-15 LAB — CYTOLOGY - NON PAP

## 2022-11-28 ENCOUNTER — Other Ambulatory Visit: Payer: Self-pay | Admitting: Cardiology

## 2022-11-28 DIAGNOSIS — I5042 Chronic combined systolic (congestive) and diastolic (congestive) heart failure: Secondary | ICD-10-CM

## 2023-01-27 ENCOUNTER — Other Ambulatory Visit: Payer: Self-pay | Admitting: Cardiology

## 2023-01-27 NOTE — Telephone Encounter (Signed)
 Rx refill sent to pharmacy.

## 2023-02-01 ENCOUNTER — Telehealth: Payer: Self-pay | Admitting: Cardiology

## 2023-02-01 ENCOUNTER — Other Ambulatory Visit: Payer: Self-pay

## 2023-02-01 NOTE — Telephone Encounter (Signed)
 Called patient and she stated that the cost of mexiletine has increased to $300 and she is unable to afford it. She is asking if there is another medication that does the same thing as mexiletine that can be prescribed for her.

## 2023-02-01 NOTE — Telephone Encounter (Signed)
 Pt c/o medication issue:  1. Name of Medication:   mexiletine (MEXITIL ) 250 MG capsule    2. How are you currently taking this medication (dosage and times per day)?   Take 1 capsule (250 mg total) by mouth 2 (two) times daily.    3. Are you having a reaction (difficulty breathing--STAT)? No  4. What is your medication issue? Pt states that medication cost has increased to $300. Pt says that per her insurance she needs to see if an alternate medication can be prescribed. Please advise

## 2023-02-03 ENCOUNTER — Other Ambulatory Visit: Payer: Self-pay

## 2023-02-03 DIAGNOSIS — I493 Ventricular premature depolarization: Secondary | ICD-10-CM

## 2023-02-03 NOTE — Telephone Encounter (Signed)
 Left message for the patient to call back.

## 2023-02-03 NOTE — Telephone Encounter (Signed)
 Called patient and informed her of Dr. Inocencio recommendation to have an echo below:  Lets repeat her echo to ensure improvement in EF.  If EF has improved we will have more options for antiarrhythmics  Patient verbalized understanding and was agreeable to having the echo performed in [facility]. An order for a echo was entered via Epic. Patient had no further questions at this time.

## 2023-02-06 DIAGNOSIS — N1831 Chronic kidney disease, stage 3a: Secondary | ICD-10-CM | POA: Diagnosis not present

## 2023-02-06 DIAGNOSIS — F331 Major depressive disorder, recurrent, moderate: Secondary | ICD-10-CM | POA: Diagnosis not present

## 2023-02-06 DIAGNOSIS — F419 Anxiety disorder, unspecified: Secondary | ICD-10-CM | POA: Diagnosis not present

## 2023-02-06 DIAGNOSIS — G4700 Insomnia, unspecified: Secondary | ICD-10-CM | POA: Diagnosis not present

## 2023-02-06 DIAGNOSIS — E7800 Pure hypercholesterolemia, unspecified: Secondary | ICD-10-CM | POA: Diagnosis not present

## 2023-02-06 DIAGNOSIS — I509 Heart failure, unspecified: Secondary | ICD-10-CM | POA: Diagnosis not present

## 2023-02-06 DIAGNOSIS — R7303 Prediabetes: Secondary | ICD-10-CM | POA: Diagnosis not present

## 2023-02-06 DIAGNOSIS — Z23 Encounter for immunization: Secondary | ICD-10-CM | POA: Diagnosis not present

## 2023-02-07 ENCOUNTER — Telehealth: Payer: Self-pay | Admitting: Cardiology

## 2023-02-07 DIAGNOSIS — I5042 Chronic combined systolic (congestive) and diastolic (congestive) heart failure: Secondary | ICD-10-CM

## 2023-02-07 MED ORDER — CARVEDILOL 6.25 MG PO TABS: 6.2500 mg | ORAL_TABLET | Freq: Two times a day (BID) | ORAL | 2 refills | Status: AC

## 2023-02-07 MED ORDER — MEXILETINE HCL 250 MG PO CAPS: 250.0000 mg | ORAL_CAPSULE | Freq: Two times a day (BID) | ORAL | 2 refills | Status: DC

## 2023-02-07 MED ORDER — ENTRESTO 24-26 MG PO TABS
1.0000 | ORAL_TABLET | Freq: Two times a day (BID) | ORAL | 2 refills | Status: DC
Start: 2023-02-07 — End: 2023-10-26

## 2023-02-07 NOTE — Telephone Encounter (Signed)
 RX sent

## 2023-02-07 NOTE — Telephone Encounter (Signed)
*  STAT* If patient is at the pharmacy, call can be transferred to refill team.   1. Which medications need to be refilled? (please list name of each medication and dose if known)  carvedilol  (COREG ) 6.25 MG tablet  sacubitril -valsartan  (ENTRESTO ) 24-26 MG  mexiletine (MEXITIL ) 250 MG capsule    2. Would you like to learn more about the convenience, safety, & potential cost savings by using the [facility]?     3. Are you open to using the [facility] (Type [facility].  ).   4. Which pharmacy/location (including street and city if local pharmacy) is medication to be sent to? [facility]Phone # [phone]   5. Do they need a 30 day or 90 day supply? 90 day CHANGE IN PHARMACY

## 2023-02-14 ENCOUNTER — Ambulatory Visit (HOSPITAL_COMMUNITY)
Admission: RE | Admit: 2023-02-14 | Discharge: 2023-02-14 | Disposition: A | Discharge status: Home / Self Care | Payer: Medicare Other | Source: Ambulatory Visit | Attending: Cardiology | Admitting: Cardiology

## 2023-02-14 DIAGNOSIS — E785 Hyperlipidemia, unspecified: Secondary | ICD-10-CM | POA: Insufficient documentation

## 2023-02-14 DIAGNOSIS — I509 Heart failure, unspecified: Secondary | ICD-10-CM | POA: Insufficient documentation

## 2023-02-14 DIAGNOSIS — I493 Ventricular premature depolarization: Secondary | ICD-10-CM | POA: Insufficient documentation

## 2023-02-14 DIAGNOSIS — Z006 Encounter for examination for normal comparison and control in clinical research program: Secondary | ICD-10-CM

## 2023-02-14 LAB — ECHOCARDIOGRAM COMPLETE
Calc EF: 37.2 %
S' Lateral: 4.3 cm
Single Plane A2C EF: 33.2 %
Single Plane A4C EF: 35.4 %

## 2023-02-14 NOTE — Research (Signed)
SITE: [id number]     Subject # [id number]    Subprotocol: A  Inclusion Criteria  Patients who meet all of the following criteria are eligible for enrollment as study participants:  Yes No  Age > 78 years old X   Eligible to wear Holter Study X    Exclusion Criteria  Patients who meet any of these criteria are not eligible for enrollment as study participants: Yes No  1. Receiving any mechanical (respiratory or circulatory) or renal support therapy at Screening or during Visit #1.  X  2.  Any other conditions that in the opinion of the investigators are likely to prevent compliance with the study protocol or pose a safety concern if the subject participates in the study.  X  3. Poor tolerance, namely susceptible to severe skin allergies from ECG adhesive patch application.  X   Protocol: REV H                                     Residential Zip [address] (First 3 digits ONLY)                                             [employer] Informed Consent   Subject Name: Paige [name]  Subject met inclusion and exclusion criteria.  The informed consent form, study requirements and expectations were reviewed with the subject. Subject had opportunity to read consent and questions and concerns were addressed prior to the signing of the consent form.  The subject verbalized understanding of the trial requirements.  The subject agreed to participate in the [employer] EF ACT trial and signed the informed consent at 13:43 on 14-Feb-2023.  The informed consent was obtained prior to performance of any protocol-specific procedures for the subject.  A copy of the signed informed consent was given to the subject and a copy was placed in the subject's medical record.   Dyanne Iha          Current Outpatient Medications:    aspirin 81 MG chewable tablet, Chew 81 mg by mouth daily., Disp: , Rfl:    atorvastatin (LIPITOR) 40 MG tablet, Take 1 tablet (40 mg total) by mouth daily., Disp: 30 tablet, Rfl: 0    buPROPion (WELLBUTRIN XL) 150 MG 24 hr tablet, Take 150 mg by mouth every morning., Disp: , Rfl:    carvedilol (COREG) 6.25 MG tablet, Take 1 tablet (6.25 mg total) by mouth 2 (two) times daily., Disp: 180 tablet, Rfl: 2   DULoxetine (CYMBALTA) 60 MG capsule, Take 60 mg by mouth daily., Disp: , Rfl:    furosemide (LASIX) 40 MG tablet, Take 40 mg by mouth daily as needed for fluid or edema., Disp: , Rfl:    JARDIANCE 25 MG TABS tablet, Take 25 mg by mouth daily., Disp: , Rfl:    methocarbamol (ROBAXIN) 500 MG tablet, Take 500-1,000 mg by mouth every 8 (eight) hours., Disp: , Rfl:    mexiletine (MEXITIL) 250 MG capsule, Take 1 capsule (250 mg total) by mouth 2 (two) times daily., Disp: 180 capsule, Rfl: 2   oxyCODONE (OXY IR/ROXICODONE) 5 MG immediate release tablet, Take 1-2 tablets (5-10 mg total) by mouth every 6 (six) hours as needed for moderate pain or severe pain., Disp: 20 tablet, Rfl: 0  Phenylephrine HCl (SINEX REGULAR NA), Place 1 spray into both nostrils daily as needed for congestion (congestion)., Disp: , Rfl:    polyethylene glycol (MIRALAX / GLYCOLAX) 17 g packet, Take 17 g by mouth 2 (two) times daily. (Patient not taking: Reported on 09/02/2022), Disp: 14 each, Rfl: 0   pramipexole (MIRAPEX) 0.25 MG tablet, Take 0.25-0.5 mg by mouth See admin instructions. Take 1 tablet (0.25 mg) in the morning and take 2 tablets (0.5 mg) at bedtime, Disp: , Rfl:    raloxifene (EVISTA) 60 MG tablet, Take 60 mg by mouth daily., Disp: , Rfl:    sacubitril-valsartan (ENTRESTO) 24-26 MG, Take 1 tablet by mouth 2 (two) times daily., Disp: 180 tablet, Rfl: 2   spironolactone (ALDACTONE) 25 MG tablet, Take 0.5 tablets (12.5 mg total) by mouth daily., Disp: 45 tablet, Rfl: 2   zolpidem (AMBIEN) 10 MG tablet, Take 10 mg by mouth at bedtime., Disp: , Rfl:

## 2023-03-23 ENCOUNTER — Telehealth: Payer: Self-pay

## 2023-03-23 NOTE — Telephone Encounter (Signed)
 Left vm to return call.

## 2023-03-23 NOTE — Telephone Encounter (Signed)
-----   Message from Garwin Brothers sent at 03/16/2023 11:45 AM EST -----  ----- Message ----- From: Regan Lemming, MD Sent: 02/16/2023   4:17 PM EST To: Baird Lyons, RN; Garwin Brothers, MD  EF remains low with PVCs suppressed.  Plan per primary cardiology.

## 2023-04-07 NOTE — Addendum Note (Signed)
 Encounter addended by: Howell Rucks, RDCS on: 04/07/2023 2:41 PM  Actions taken: Imaging Exam ended

## 2023-04-11 DIAGNOSIS — H02834 Dermatochalasis of left upper eyelid: Secondary | ICD-10-CM | POA: Diagnosis not present

## 2023-04-11 DIAGNOSIS — I639 Cerebral infarction, unspecified: Secondary | ICD-10-CM | POA: Diagnosis not present

## 2023-04-11 DIAGNOSIS — H43813 Vitreous degeneration, bilateral: Secondary | ICD-10-CM | POA: Diagnosis not present

## 2023-04-11 DIAGNOSIS — H02831 Dermatochalasis of right upper eyelid: Secondary | ICD-10-CM | POA: Diagnosis not present

## 2023-04-25 ENCOUNTER — Ambulatory Visit: Attending: Cardiology | Admitting: Cardiology

## 2023-04-25 ENCOUNTER — Encounter: Payer: Self-pay | Admitting: Cardiology

## 2023-04-25 VITALS — BP 132/78 | HR 97 | Ht 68.0 in | Wt 318.1 lb

## 2023-04-25 DIAGNOSIS — I10 Essential (primary) hypertension: Secondary | ICD-10-CM | POA: Diagnosis not present

## 2023-04-25 DIAGNOSIS — Z6841 Body Mass Index (BMI) 40.0 and over, adult: Secondary | ICD-10-CM

## 2023-04-25 DIAGNOSIS — E782 Mixed hyperlipidemia: Secondary | ICD-10-CM | POA: Diagnosis not present

## 2023-04-25 DIAGNOSIS — I5022 Chronic systolic (congestive) heart failure: Secondary | ICD-10-CM

## 2023-04-25 NOTE — Progress Notes (Signed)
 Cardiology Office Note:    Date:  04/25/2023   ID:  Paige [name], Puccini 28-Dec-1945, MRN [id number]  PCP:  [name], MD  Cardiologist:  Garwin Brothers, MD   Referring MD: [name], MD    ASSESSMENT:    1. Mixed hyperlipidemia   2. Chronic HFrEF (heart failure with reduced ejection fraction) (HCC)   3. Essential hypertension   4. Morbid obesity with BMI of 50.0-59.9, adult (HCC)    PLAN:    In order of problems listed above:  Primary prevention stressed with the patient.  Importance of compliance with diet medication stressed and patient verbalized standing. Reduced ejection fraction heart failure: Stable at this time.  No symptoms of congestive heart failure.  CHF education given to the patient.  She has an appointment with her electrophysiologist in another month and he will review this at the date of recent echo.  Patient is on guideline directed medical therapy and we will do a Chem-7 and magnesium level today. Essential hypertension: Blood pressure is stable and diet was emphasized. Mixed dyslipidemia: On lipid-lowering medications followed by primary. Mexiletine therapy: Followed by electrophysiologist. Morbid obesity: Weight reduction stressed diet emphasized.  Risks of obesity explained. Patient will be seen in follow-up appointment in 6 months or earlier if the patient has any concerns.    Medication Adjustments/Labs and Tests Ordered: Current medicines are reviewed at length with the patient today.  Concerns regarding medicines are outlined above.  Orders Placed This Encounter  Procedures   Basic metabolic panel with GFR   Magnesium   EKG 12-Lead   No orders of the defined types were placed in this encounter.    No chief complaint on file.    History of Present Illness:    Paige [name] is a 78 y.o. female.  Patient has past medical history of reduced ejection fraction hyperlipidemia, essential hypertension, mixed dyslipidemia and  cardiomyopathy.  She is morbidly obese and sedentary lifestyle.  She is followed by her electrophysiologist.  This is for frequent PVCs.  At the time of my evaluation, the patient is alert awake oriented and in no distress.  Her echocardiogram stable moderately depressed ejection fraction.  Past Medical History:  Diagnosis Date   Acute exacerbation of CHF (congestive heart failure) (HCC) 05/04/2020   Acute respiratory failure with hypoxia (HCC) 05/08/2020   Anemia    Anxiety disorder 05/04/2020   Arthritis    Arthritis of knee 09/25/2013   Arthritis of knee, left 09/25/2013   Arthritis of right knee 02/10/2014   Back pain    Chronic HFrEF (heart failure with reduced ejection fraction) (HCC) 06/22/2022   Chronic kidney disease, stage 3a (HCC) 05/04/2020   Class 3 severe obesity with serious comorbidity and body mass index (BMI) of 45.0 to 49.9 in adult (HCC) 03/29/2018   Closed wedge compression fracture of third lumbar vertebra (HCC) 09/02/2022   Congestive heart failure (HCC) 06/01/2021   Depression    Elevated troponin 05/08/2020   Esophageal reflux 05/04/2020   Essential hypertension 05/08/2020   Fatigue    First degree AV block 06/22/2022   Gallbladder problem    H/O hiatal hernia    Hearing loss 06/01/2021   High cholesterol 05/04/2020   Hyperlipidemia 05/08/2020   Insomnia 06/01/2021   Insulin resistance 03/29/2018   Interscapular region contusion 06/01/2021   Joint pain    Lumbar vertebral fracture (HCC) 06/22/2022   Lymphedema 08/30/2022   Lymphedema of both lower extremities  Morbid obesity with BMI of 50.0-59.9, adult (HCC) 05/08/2020   Muscle pain 06/01/2021   Obesity    Osteoarthritis    Other long term (current) drug therapy 06/01/2021   Palpitations 11/24/2020   Perennial allergic rhinitis 01/10/2018   Pernicious anemia 06/01/2021   PONV (postoperative nausea and vomiting)    after Cholecysectomy   Prediabetes 06/01/2021   Primary osteoarthritis of left  hip 11/26/2015   Primary osteoarthritis of right knee 02/09/2014   PVC's (premature ventricular contractions) 06/22/2022   Restless leg syndrome 05/08/2020   Rhinitis medicamentosa 01/10/2018   Sleep disturbance 06/01/2021   Systolic and diastolic CHF, acute (HCC) 05/08/2020   Thyroid nodule 06/22/2022   Tremor 06/01/2021   Urinary incontinence 06/01/2021   Vasomotor rhinitis 01/10/2018   Vertigo    Vitamin B 12 deficiency    Vitamin D deficiency 03/29/2018    Past Surgical History:  Procedure Laterality Date   ABDOMINAL HYSTERECTOMY     CHOLECYSTECTOMY     COLONOSCOPY     EYE SURGERY Bilateral    cataracts   FRACTURE SURGERY Right    arm age 62   FRACTURE SURGERY Bilateral    both thumbs   IR KYPHO LUMBAR INC FX REDUCE BONE BX UNI/BIL CANNULATION INC/IMAGING  06/27/2022   LAPAROSCOPIC GASTRIC BANDING     2010   OVARIAN CYST REMOVAL     RIGHT/LEFT HEART CATH AND CORONARY ANGIOGRAPHY N/A 05/07/2020   Procedure: RIGHT/LEFT HEART CATH AND CORONARY ANGIOGRAPHY;  Surgeon: Orpah Cobb, MD;  Location: [facility];  Service: Cardiovascular;  Laterality: N/A;   TOTAL HIP ARTHROPLASTY Left 11/30/2015   Procedure: TOTAL HIP ARTHROPLASTY ANTERIOR APPROACH;  Surgeon: Gean Birchwood, MD;  Location: [facility] OR;  Service: Orthopedics;  Laterality: Left;   TOTAL KNEE ARTHROPLASTY Left 09/25/2013   Procedure: TOTAL KNEE ARTHROPLASTY;  Surgeon: Nestor Lewandowsky, MD;  Location: [facility] OR;  Service: Orthopedics;  Laterality: Left;   TOTAL KNEE ARTHROPLASTY Right 02/10/2014   dr Turner Daniels   TOTAL KNEE ARTHROPLASTY Right 02/10/2014   Procedure: TOTAL KNEE ARTHROPLASTY;  Surgeon: Nestor Lewandowsky, MD;  Location: [facility] OR;  Service: Orthopedics;  Laterality: Right;    Current Medications: Current Meds  Medication Sig   ALPRAZolam (XANAX) 0.25 MG tablet Take 0.25 mg by mouth as needed for anxiety.   aspirin 81 MG chewable tablet Chew 81 mg by mouth daily.   atorvastatin (LIPITOR) 40 MG tablet Take 1 tablet (40 mg  total) by mouth daily.   buPROPion (WELLBUTRIN XL) 150 MG 24 hr tablet Take 150 mg by mouth every morning.   carvedilol (COREG) 6.25 MG tablet Take 1 tablet (6.25 mg total) by mouth 2 (two) times daily.   DULoxetine (CYMBALTA) 60 MG capsule Take 60 mg by mouth daily.   furosemide (LASIX) 40 MG tablet Take 40 mg by mouth daily as needed for fluid or edema.   JARDIANCE 25 MG TABS tablet Take 25 mg by mouth daily.   mexiletine (MEXITIL) 250 MG capsule Take 1 capsule (250 mg total) by mouth 2 (two) times daily.   ondansetron (ZOFRAN) 4 MG tablet Take 4-8 mg by mouth 3 (three) times daily as needed for nausea or vomiting.   Phenylephrine HCl (SINEX REGULAR NA) Place 1 spray into both nostrils daily as needed for congestion (congestion).   pramipexole (MIRAPEX) 0.25 MG tablet Take 0.25-0.5 mg by mouth See admin instructions. Take 2 tablets in the morning and take 2 tablets (0.5 mg) at bedtime   raloxifene (EVISTA)  60 MG tablet Take 60 mg by mouth daily.   sacubitril-valsartan (ENTRESTO) 24-26 MG Take 1 tablet by mouth 2 (two) times daily.   spironolactone (ALDACTONE) 25 MG tablet Take 0.5 tablets (12.5 mg total) by mouth daily.   zolpidem (AMBIEN) 10 MG tablet Take 10 mg by mouth at bedtime.     Allergies:   No known allergies   Social History   Socioeconomic History   Marital status: Widowed    Spouse name: Not on file   Number of children: Not on file   Years of education: Not on file   Highest education level: Not on file  Occupational History   Occupation: Retired  Tobacco Use   Smoking status: Never   Smokeless tobacco: Never  Substance and Sexual Activity   Alcohol use: Yes    Comment: rarely   Drug use: No   Sexual activity: Not on file  Other Topics Concern   Not on file  Social History Narrative   Not on file   Social Drivers of Health   Financial Resource Strain: Low Risk  (07/01/2020)   Overall Financial Resource Strain (CARDIA)    Difficulty of Paying Living Expenses:  Not hard at all  Food Insecurity: No Food Insecurity (06/22/2022)   Hunger Vital Sign    Worried About Running Out of Food in the Last Year: Never true    Ran Out of Food in the Last Year: Never true  Transportation Needs: No Transportation Needs (06/22/2022)   PRAPARE - Administrator, Civil Service (Medical): No    Lack of Transportation (Non-Medical): No  Physical Activity: Not on file  Stress: Not on file  Social Connections: Not on file     Family History: The patient's family history includes Depression in her mother; Hypertension in her mother.  ROS:   Please see the history of present illness.    All other systems reviewed and are negative.  EKGs/Labs/Other Studies Reviewed:    The following studies were reviewed today: .Marland KitchenEKG Interpretation Date/Time:  Tuesday April 25 2023 14:21:36 EDT Ventricular Rate:  97 PR Interval:    QRS Duration:  94 QT Interval:  350 QTC Calculation: 444 R Axis:   -36  Text Interpretation: Sinus rhythm Inferior infarct , age undetermined Possible Anterior infarct , age undetermined When compared with ECG of 14-Jul-2022 16:17, Junctional rhythm has replaced Sinus rhythm Borderline criteria for Anterior infarct are now Present Inferior infarct is now Present Confirmed by Belva Crome [phone]) on 04/25/2023 2:59:39 PM       Recent Labs: 06/21/2022: ALT 25 06/22/2022: TSH 2.961 07/14/2022: BUN 20; Creatinine, Ser 1.08; Hemoglobin 16.7; Platelets 420; Potassium 4.5; Sodium 134  Recent Lipid Panel    Component Value Date/Time   CHOL 119 11/26/2020 1554   TRIG 73 11/26/2020 1554   HDL 52 11/26/2020 1554   CHOLHDL 2.3 11/26/2020 1554   CHOLHDL 3.4 05/08/2020 0133   VLDL 17 05/08/2020 0133   LDLCALC 52 11/26/2020 1554    Physical Exam:    VS:  BP 132/78   Pulse 97   Ht 5\' 8"  (1.727 m)   Wt (!) 318 lb 1.3 oz (144.3 kg)   SpO2 95%   BMI 48.36 kg/m     Wt Readings from Last 3 Encounters:  04/25/23 (!) 318 lb 1.3 oz (144.3 kg)   09/02/22 (!) 320 lb 1.3 oz (145.2 kg)  06/22/22 (!) 336 lb 14.4 oz (152.8 kg)     GEN: Patient is  in no acute distress HEENT: Normal NECK: No JVD; No carotid bruits LYMPHATICS: No lymphadenopathy CARDIAC: Hear sounds regular, 2/6 systolic murmur at the apex. RESPIRATORY:  Clear to auscultation without rales, wheezing or rhonchi  ABDOMEN: Soft, non-tender, non-distended MUSCULOSKELETAL:  No edema; No deformity  SKIN: Warm and dry NEUROLOGIC:  Alert and oriented x 3 PSYCHIATRIC:  Normal affect   Signed, Garwin Brothers, MD  04/25/2023 2:57 PM    [facility]

## 2023-04-25 NOTE — Patient Instructions (Signed)
 Medication Instructions:  Your physician recommends that you continue on your current medications as directed. Please refer to the Current Medication list given to you today.  *If you need a refill on your cardiac medications before your next appointment, please call your pharmacy*   Lab Work: Your physician recommends that you have a BMP and magnesium today in the office.  If you have labs (blood work) drawn today and your tests are completely normal, you will receive your results only by: MyChart Message (if you have MyChart) OR A paper copy in the mail If you have any lab test that is abnormal or we need to change your treatment, we will call you to review the results.   Testing/Procedures: None ordered   Follow-Up: At [facility], you and your health needs are our priority.  As part of our continuing mission to provide you with exceptional heart care, we have created designated Provider Care Teams.  These Care Teams include your primary Cardiologist (physician) and Advanced Practice Providers (APPs -  Physician Assistants and Nurse Practitioners) who all work together to provide you with the care you need, when you need it.  We recommend signing up for the patient portal called "MyChart".  Sign up information is provided on this After Visit Summary.  MyChart is used to connect with patients for Virtual Visits (Telemedicine).  Patients are able to view lab/test results, encounter notes, upcoming appointments, etc.  Non-urgent messages can be sent to your provider as well.   To learn more about what you can do with MyChart, go to [email or web address].    Your next appointment:   9 month(s)  The format for your next appointment:   In Person  Provider:   Belva Crome, MD    Other Instructions none  Important Information About Sugar

## 2023-04-26 LAB — BASIC METABOLIC PANEL WITH GFR
BUN/Creatinine Ratio: 22 (ref 12–28)
BUN: 25 mg/dL (ref 8–27)
CO2: 19 mmol/L — ABNORMAL LOW (ref 20–29)
Calcium: 9.4 mg/dL (ref 8.7–10.3)
Chloride: 98 mmol/L (ref 96–106)
Creatinine, Ser: 1.12 mg/dL — ABNORMAL HIGH (ref 0.57–1.00)
Glucose: 127 mg/dL — ABNORMAL HIGH (ref 70–99)
Potassium: 4.8 mmol/L (ref 3.5–5.2)
Sodium: 139 mmol/L (ref 134–144)
eGFR: 51 mL/min/{1.73_m2} — ABNORMAL LOW (ref >59)

## 2023-04-26 LAB — MAGNESIUM: Magnesium: 2 mg/dL (ref 1.6–2.3)

## 2023-05-02 DIAGNOSIS — R251 Tremor, unspecified: Secondary | ICD-10-CM | POA: Diagnosis not present

## 2023-05-02 DIAGNOSIS — I10 Essential (primary) hypertension: Secondary | ICD-10-CM | POA: Diagnosis not present

## 2023-05-02 DIAGNOSIS — G5621 Lesion of ulnar nerve, right upper limb: Secondary | ICD-10-CM | POA: Diagnosis not present

## 2023-05-02 DIAGNOSIS — R7303 Prediabetes: Secondary | ICD-10-CM | POA: Diagnosis not present

## 2023-05-02 DIAGNOSIS — Z6841 Body Mass Index (BMI) 40.0 and over, adult: Secondary | ICD-10-CM | POA: Diagnosis not present

## 2023-05-11 DIAGNOSIS — H90A22 Sensorineural hearing loss, unilateral, left ear, with restricted hearing on the contralateral side: Secondary | ICD-10-CM | POA: Diagnosis not present

## 2023-05-29 ENCOUNTER — Ambulatory Visit: Attending: Cardiology | Admitting: Cardiology

## 2023-05-29 ENCOUNTER — Encounter: Payer: Self-pay | Admitting: Cardiology

## 2023-05-29 VITALS — BP 130/68 | HR 96 | Ht 68.0 in | Wt 317.8 lb

## 2023-05-29 DIAGNOSIS — I10 Essential (primary) hypertension: Secondary | ICD-10-CM | POA: Diagnosis not present

## 2023-05-29 DIAGNOSIS — I5022 Chronic systolic (congestive) heart failure: Secondary | ICD-10-CM

## 2023-05-29 DIAGNOSIS — I493 Ventricular premature depolarization: Secondary | ICD-10-CM | POA: Diagnosis not present

## 2023-05-29 NOTE — Progress Notes (Signed)
  Electrophysiology Office Note:   Date:  05/29/2023  ID:  Paige, [name] [birth year], MRN [id number]  Primary Cardiologist: Paige Balzarine, MD Primary Heart Failure: None Electrophysiologist: Paige Nonaka Cortland Ding, MD      History of Present Illness:   Paige [name] is a 78 y.o. female with h/o chronic systolic heart failure, CKD stage IIIa, morbid obesity, hypertension, hyperlipidemia, PVCs seen today for routine electrophysiology followup.   Since last being seen in our clinic the patient reports doing well.  She has no chest pain or shortness of breath.  She is able to do her daily activities without restriction.  She notes no further palpitations.  She is able to afford the mexiletine from a different pharmacy.  she denies chest pain, palpitations, dyspnea, PND, orthopnea, nausea, vomiting, dizziness, syncope, edema, weight gain, or early satiety.   Review of systems complete and found to be negative unless listed in HPI.   EP Information / Studies Reviewed:    EKG is not ordered today. EKG from 04/25/2023 reviewed which showed sinus rhythm        Risk Assessment/Calculations:             Physical Exam:   VS:  BP 130/68   Pulse 96   Ht 5\' 8"  (1.727 m)   Wt (!) 317 lb 12.8 oz (144.2 kg)   SpO2 96%   BMI 48.32 kg/m    Wt Readings from Last 3 Encounters:  05/29/23 (!) 317 lb 12.8 oz (144.2 kg)  04/25/23 (!) 318 lb 1.3 oz (144.3 kg)  09/02/22 (!) 320 lb 1.3 oz (145.2 kg)     GEN: Well nourished, well developed in no acute distress NECK: No JVD; No carotid bruits CARDIAC: Regular rate and rhythm, no murmurs, rubs, gallops RESPIRATORY:  Clear to auscultation without rales, wheezing or rhonchi  ABDOMEN: Soft, non-tender, non-distended EXTREMITIES:  No edema; No deformity   ASSESSMENT AND PLAN:    1.  Chronic systolic heart failure: Due to nonischemic cardiomyopathy.  Currently on medical therapy per primary cardiology.  We discussed the possibility of ICD therapy.   For now, she is elected to hold off.  Paige [name] continue with current management per primary cardiology.  2.  PVCs: 10.2% burden.  On mexiletine 250 mg twice daily.  PVCs on EKG today.  Paige [name] continue with current management.  3.  Hypertension: Well-controlled  Follow up with Dr. Lawana [name] in 12 months  Signed, Dezman Granda Cortland Ding, MD

## 2023-07-20 DIAGNOSIS — F3342 Major depressive disorder, recurrent, in full remission: Secondary | ICD-10-CM | POA: Diagnosis not present

## 2023-07-20 DIAGNOSIS — G2581 Restless legs syndrome: Secondary | ICD-10-CM | POA: Diagnosis not present

## 2023-08-03 DIAGNOSIS — G629 Polyneuropathy, unspecified: Secondary | ICD-10-CM | POA: Diagnosis not present

## 2023-08-03 DIAGNOSIS — Z6841 Body Mass Index (BMI) 40.0 and over, adult: Secondary | ICD-10-CM | POA: Diagnosis not present

## 2023-08-03 DIAGNOSIS — G4700 Insomnia, unspecified: Secondary | ICD-10-CM | POA: Diagnosis not present

## 2023-08-28 ENCOUNTER — Other Ambulatory Visit: Payer: Self-pay | Admitting: Cardiology

## 2023-08-28 DIAGNOSIS — I5042 Chronic combined systolic (congestive) and diastolic (congestive) heart failure: Secondary | ICD-10-CM

## 2023-09-12 DIAGNOSIS — Z0000 Encounter for general adult medical examination without abnormal findings: Secondary | ICD-10-CM | POA: Diagnosis not present

## 2023-10-26 ENCOUNTER — Telehealth: Payer: Self-pay | Admitting: Cardiology

## 2023-10-26 DIAGNOSIS — I5042 Chronic combined systolic (congestive) and diastolic (congestive) heart failure: Secondary | ICD-10-CM

## 2023-10-26 MED ORDER — SACUBITRIL-VALSARTAN 24-26 MG PO TABS: 1.0000 | ORAL_TABLET | Freq: Two times a day (BID) | ORAL | 1 refills | Status: DC

## 2023-10-26 NOTE — Telephone Encounter (Signed)
 Pt's medication was sent to pt's pharmacy as requested. Confirmation received.

## 2023-10-26 NOTE — Telephone Encounter (Signed)
*  STAT* If patient is at the pharmacy, call can be transferred to refill team.   1. Which medications need to be refilled? (please list name of each medication and dose if known) sacubitril -valsartan  (ENTRESTO ) 24-26 MG    2. Would you like to learn more about the convenience, safety, & potential cost savings by using the [facility]?      3. Are you open to using the [facility] (Type [facility]. ).   4. Which pharmacy/location (including street and city if local pharmacy) is medication to be sent to? [facility] - [place], [place] - [address]    5. Do they need a 30 day or 90 day supply? 90 day

## 2023-11-21 ENCOUNTER — Other Ambulatory Visit: Payer: Self-pay | Admitting: Cardiology

## 2024-01-03 ENCOUNTER — Other Ambulatory Visit: Payer: Self-pay

## 2024-01-03 DIAGNOSIS — I5042 Chronic combined systolic (congestive) and diastolic (congestive) heart failure: Secondary | ICD-10-CM

## 2024-01-03 MED ORDER — SACUBITRIL-VALSARTAN 24-26 MG PO TABS: 1.0000 | ORAL_TABLET | Freq: Two times a day (BID) | ORAL | 0 refills | Status: AC

## 2024-01-04 ENCOUNTER — Telehealth: Payer: Self-pay | Admitting: Cardiology

## 2024-01-04 DIAGNOSIS — I5042 Chronic combined systolic (congestive) and diastolic (congestive) heart failure: Secondary | ICD-10-CM

## 2024-01-04 MED ORDER — MEXILETINE HCL 250 MG PO CAPS: 250.0000 mg | ORAL_CAPSULE | Freq: Two times a day (BID) | ORAL | 0 refills | Status: AC

## 2024-01-04 NOTE — Telephone Encounter (Signed)
°*  STAT* If patient is at the pharmacy, call can be transferred to refill team.   1. Which medications need to be refilled? (please list name of each medication and dose if known) spironolactone  (ALDACTONE ) 25 MG tablet  mexiletine (MEXITIL ) 250 MG capsule    2. Would you like to learn more about the convenience, safety, & potential cost savings by using the [facility]?    3. Are you open to using the [facility] (Type [facility].  ).   4. Which pharmacy/location (including street and city if local pharmacy) is medication to be sent to?  [email or web address] [facility] - [place] - [address]     5. Do they need a 30 day or 90 day supply? 90 day
